# Patient Record
Sex: Male | Born: 1960 | Race: Black or African American | Hispanic: No | Marital: Married | Smoking: Former smoker
Health system: Southern US, Community
[De-identification: ages and names within clinical notes are randomized; demographics above are authoritative.]

## PROBLEM LIST (undated history)

## (undated) DIAGNOSIS — I5020 Unspecified systolic (congestive) heart failure: Secondary | ICD-10-CM

## (undated) DIAGNOSIS — I10 Essential (primary) hypertension: Secondary | ICD-10-CM

## (undated) DIAGNOSIS — Z95810 Presence of automatic (implantable) cardiac defibrillator: Secondary | ICD-10-CM

## (undated) DIAGNOSIS — I447 Left bundle-branch block, unspecified: Secondary | ICD-10-CM

## (undated) DIAGNOSIS — M545 Low back pain, unspecified: Secondary | ICD-10-CM

## (undated) DIAGNOSIS — E7800 Pure hypercholesterolemia, unspecified: Secondary | ICD-10-CM

## (undated) DIAGNOSIS — K219 Gastro-esophageal reflux disease without esophagitis: Secondary | ICD-10-CM

## (undated) DIAGNOSIS — I428 Other cardiomyopathies: Secondary | ICD-10-CM

## (undated) DIAGNOSIS — Z9289 Personal history of other medical treatment: Secondary | ICD-10-CM

## (undated) DIAGNOSIS — N189 Chronic kidney disease, unspecified: Secondary | ICD-10-CM

## (undated) DIAGNOSIS — I509 Heart failure, unspecified: Secondary | ICD-10-CM

## (undated) DIAGNOSIS — I2510 Atherosclerotic heart disease of native coronary artery without angina pectoris: Secondary | ICD-10-CM

## (undated) HISTORY — DX: Unspecified systolic (congestive) heart failure: I50.20

## (undated) HISTORY — DX: Atherosclerotic heart disease of native coronary artery without angina pectoris: I25.10

## (undated) HISTORY — DX: Left bundle-branch block, unspecified: I44.7

## (undated) HISTORY — DX: Low back pain, unspecified: M54.50

## (undated) HISTORY — DX: Essential (primary) hypertension: I10

## (undated) HISTORY — DX: Personal history of other medical treatment: Z92.89

## (undated) HISTORY — DX: Gastro-esophageal reflux disease without esophagitis: K21.9

## (undated) HISTORY — DX: Other cardiomyopathies: I42.8

## (undated) HISTORY — DX: Low back pain: M54.5

## (undated) HISTORY — DX: Chronic kidney disease, unspecified: N18.9

---

## 2009-01-01 ENCOUNTER — Emergency Department (HOSPITAL_COMMUNITY): Admission: EM | Admit: 2009-01-01 | Discharge: 2009-01-01 | Payer: Self-pay | Admitting: Emergency Medicine

## 2009-01-28 ENCOUNTER — Ambulatory Visit: Payer: Self-pay | Admitting: Family Medicine

## 2009-01-28 DIAGNOSIS — K219 Gastro-esophageal reflux disease without esophagitis: Secondary | ICD-10-CM | POA: Insufficient documentation

## 2009-01-28 DIAGNOSIS — I10 Essential (primary) hypertension: Secondary | ICD-10-CM | POA: Insufficient documentation

## 2009-01-28 DIAGNOSIS — M545 Low back pain: Secondary | ICD-10-CM

## 2009-02-20 ENCOUNTER — Encounter (INDEPENDENT_AMBULATORY_CARE_PROVIDER_SITE_OTHER): Payer: Self-pay | Admitting: Family Medicine

## 2009-02-20 LAB — CONVERTED CEMR LAB
AST: 22 units/L (ref 0–37)
Alkaline Phosphatase: 95 units/L (ref 39–117)
BUN: 20 mg/dL (ref 6–23)
Calcium: 9.5 mg/dL (ref 8.4–10.5)
Creatinine, Ser: 1.26 mg/dL (ref 0.40–1.50)
HCT: 45.8 % (ref 39.0–52.0)
Hemoglobin: 14.2 g/dL (ref 13.0–17.0)
Lymphocytes Relative: 27 % (ref 12–46)
Lymphs Abs: 2.6 10*3/uL (ref 0.7–4.0)
Monocytes Absolute: 0.8 10*3/uL (ref 0.1–1.0)
Monocytes Relative: 8 % (ref 3–12)
Neutro Abs: 6 10*3/uL (ref 1.7–7.7)
RDW: 16.2 % — ABNORMAL HIGH (ref 11.5–15.5)
TSH: 2.358 microintl units/mL (ref 0.350–4.50)
Total Bilirubin: 0.4 mg/dL (ref 0.3–1.2)
VLDL: 11 mg/dL (ref 0–40)
WBC: 9.5 10*3/uL (ref 4.0–10.5)

## 2009-02-28 ENCOUNTER — Telehealth (INDEPENDENT_AMBULATORY_CARE_PROVIDER_SITE_OTHER): Payer: Self-pay | Admitting: *Deleted

## 2009-02-28 ENCOUNTER — Ambulatory Visit: Payer: Self-pay | Admitting: Nurse Practitioner

## 2009-03-11 ENCOUNTER — Ambulatory Visit: Payer: Self-pay | Admitting: *Deleted

## 2009-04-09 ENCOUNTER — Ambulatory Visit: Payer: Self-pay | Admitting: Family Medicine

## 2009-04-09 DIAGNOSIS — N402 Nodular prostate without lower urinary tract symptoms: Secondary | ICD-10-CM

## 2009-04-09 DIAGNOSIS — F528 Other sexual dysfunction not due to a substance or known physiological condition: Secondary | ICD-10-CM

## 2009-04-09 LAB — CONVERTED CEMR LAB: PSA: 0.46 ng/mL (ref 0.10–4.00)

## 2009-04-10 ENCOUNTER — Encounter (INDEPENDENT_AMBULATORY_CARE_PROVIDER_SITE_OTHER): Payer: Self-pay | Admitting: Family Medicine

## 2009-04-26 ENCOUNTER — Encounter (INDEPENDENT_AMBULATORY_CARE_PROVIDER_SITE_OTHER): Payer: Self-pay | Admitting: Family Medicine

## 2009-05-02 ENCOUNTER — Ambulatory Visit: Payer: Self-pay | Admitting: Family Medicine

## 2009-07-05 ENCOUNTER — Emergency Department (HOSPITAL_COMMUNITY): Admission: EM | Admit: 2009-07-05 | Discharge: 2009-07-05 | Payer: Self-pay | Admitting: Emergency Medicine

## 2009-08-08 ENCOUNTER — Emergency Department (HOSPITAL_COMMUNITY): Admission: EM | Admit: 2009-08-08 | Discharge: 2009-08-08 | Payer: Self-pay | Admitting: Emergency Medicine

## 2009-12-26 ENCOUNTER — Emergency Department (HOSPITAL_COMMUNITY): Admission: EM | Admit: 2009-12-26 | Discharge: 2009-12-26 | Payer: Self-pay | Admitting: Emergency Medicine

## 2010-01-07 ENCOUNTER — Ambulatory Visit: Payer: Self-pay | Admitting: Physician Assistant

## 2010-01-07 DIAGNOSIS — E785 Hyperlipidemia, unspecified: Secondary | ICD-10-CM

## 2010-01-09 ENCOUNTER — Encounter: Payer: Self-pay | Admitting: Physician Assistant

## 2010-01-10 LAB — CONVERTED CEMR LAB
ALT: 26 units/L (ref 0–53)
AST: 15 units/L (ref 0–37)
Albumin: 4.4 g/dL (ref 3.5–5.2)
Alkaline Phosphatase: 88 units/L (ref 39–117)
BUN: 13 mg/dL (ref 6–23)
Calcium: 9.7 mg/dL (ref 8.4–10.5)
Chloride: 108 meq/L (ref 96–112)
Cholesterol, target level: 200 mg/dL
HDL: 45 mg/dL (ref >39)
Potassium: 4.3 meq/L (ref 3.5–5.3)
Sodium: 141 meq/L (ref 135–145)
Triglycerides: 90 mg/dL (ref <150)

## 2010-02-14 ENCOUNTER — Ambulatory Visit: Payer: Self-pay | Admitting: Physician Assistant

## 2010-02-14 ENCOUNTER — Telehealth: Payer: Self-pay | Admitting: Physician Assistant

## 2010-02-14 DIAGNOSIS — L919 Hypertrophic disorder of the skin, unspecified: Secondary | ICD-10-CM

## 2010-02-14 DIAGNOSIS — I447 Left bundle-branch block, unspecified: Secondary | ICD-10-CM

## 2010-02-14 DIAGNOSIS — L909 Atrophic disorder of skin, unspecified: Secondary | ICD-10-CM | POA: Insufficient documentation

## 2010-02-14 DIAGNOSIS — M79609 Pain in unspecified limb: Secondary | ICD-10-CM

## 2010-02-15 LAB — CONVERTED CEMR LAB
Calcium: 9 mg/dL (ref 8.4–10.5)
Creatinine, Ser: 1.47 mg/dL (ref 0.40–1.50)

## 2010-04-04 ENCOUNTER — Ambulatory Visit: Payer: Self-pay | Admitting: Physician Assistant

## 2010-04-04 ENCOUNTER — Telehealth: Payer: Self-pay | Admitting: Physician Assistant

## 2010-04-04 DIAGNOSIS — R0602 Shortness of breath: Secondary | ICD-10-CM

## 2010-04-04 DIAGNOSIS — K089 Disorder of teeth and supporting structures, unspecified: Secondary | ICD-10-CM | POA: Insufficient documentation

## 2010-04-04 HISTORY — DX: Shortness of breath: R06.02

## 2010-04-08 ENCOUNTER — Emergency Department (HOSPITAL_COMMUNITY): Admission: EM | Admit: 2010-04-08 | Discharge: 2010-04-08 | Payer: Self-pay | Admitting: Emergency Medicine

## 2010-04-15 ENCOUNTER — Encounter (INDEPENDENT_AMBULATORY_CARE_PROVIDER_SITE_OTHER): Payer: Self-pay | Admitting: Internal Medicine

## 2010-04-15 ENCOUNTER — Encounter: Payer: Self-pay | Admitting: Physician Assistant

## 2010-04-15 ENCOUNTER — Ambulatory Visit (HOSPITAL_COMMUNITY): Admission: RE | Admit: 2010-04-15 | Discharge: 2010-04-15 | Payer: Self-pay | Admitting: Internal Medicine

## 2010-04-24 ENCOUNTER — Ambulatory Visit: Payer: Self-pay | Admitting: Cardiology

## 2010-04-25 LAB — CONVERTED CEMR LAB
ALT: 36 units/L (ref 0–53)
AST: 27 units/L (ref 0–37)
Albumin: 4.5 g/dL (ref 3.5–5.2)
Alkaline Phosphatase: 84 units/L (ref 39–117)
BUN: 21 mg/dL (ref 6–23)
Basophils Absolute: 0.1 10*3/uL (ref 0.0–0.1)
Basophils Relative: 0.9 % (ref 0.0–3.0)
CO2: 25 meq/L (ref 19–32)
Calcium: 9.7 mg/dL (ref 8.4–10.5)
Chloride: 108 meq/L (ref 96–112)
Cholesterol: 207 mg/dL — ABNORMAL HIGH (ref 0–200)
Creatinine, Ser: 1.7 mg/dL — ABNORMAL HIGH (ref 0.4–1.5)
Eosinophils Absolute: 0.1 10*3/uL (ref 0.0–0.7)
Lymphocytes Relative: 16.7 % (ref 12.0–46.0)
Lymphs Abs: 2.6 10*3/uL (ref 0.7–4.0)
MCHC: 33.3 g/dL (ref 30.0–36.0)
MCV: 79.1 fL (ref 78.0–100.0)
Monocytes Absolute: 1.2 10*3/uL — ABNORMAL HIGH (ref 0.1–1.0)
Neutrophils Relative %: 74.2 % (ref 43.0–77.0)
Platelets: 321 10*3/uL (ref 150.0–400.0)
RDW: 15.7 % — ABNORMAL HIGH (ref 11.5–14.6)
Total Protein: 8.4 g/dL — ABNORMAL HIGH (ref 6.0–8.3)

## 2010-04-29 ENCOUNTER — Ambulatory Visit: Payer: Self-pay | Admitting: Cardiology

## 2010-04-29 LAB — CONVERTED CEMR LAB
BUN: 15 mg/dL (ref 6–23)
Chloride: 103 meq/L (ref 96–112)
Creatinine, Ser: 1.4 mg/dL (ref 0.4–1.5)
Glucose, Bld: 100 mg/dL — ABNORMAL HIGH (ref 70–99)

## 2010-04-30 ENCOUNTER — Ambulatory Visit: Payer: Self-pay | Admitting: Cardiology

## 2010-04-30 ENCOUNTER — Inpatient Hospital Stay (HOSPITAL_BASED_OUTPATIENT_CLINIC_OR_DEPARTMENT_OTHER): Admission: RE | Admit: 2010-04-30 | Discharge: 2010-04-30 | Payer: Self-pay | Admitting: Cardiology

## 2010-05-01 ENCOUNTER — Telehealth: Payer: Self-pay | Admitting: Cardiology

## 2010-05-21 ENCOUNTER — Encounter (INDEPENDENT_AMBULATORY_CARE_PROVIDER_SITE_OTHER): Payer: Self-pay | Admitting: *Deleted

## 2010-07-09 ENCOUNTER — Telehealth: Payer: Self-pay | Admitting: Cardiology

## 2010-12-30 NOTE — Letter (Signed)
Summary: *Referral Letter  [facility]  [address] [place], [place] [address]   Phone: [phone]  Fax: [phone]    01/07/2010  Thank you in advance for agreeing to see my patient:  [facility] [address] [place], [place]  [address]  Phone: [phone]  Reason for Referral: 50 yo male with symptoms of erectile dysfunction and hypertension with right sided prostate nodule.  PSA was normal in 03/2009.  He has another PSA pending.  He does have a family h/o prostate cancer.  Please evaluate.  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  Sincerely,  Tereso Newcomer PA-C

## 2010-12-30 NOTE — Assessment & Plan Note (Signed)
Summary: NP6/HTN/LBBB ON EKG/DOE   Primary Provider:  Tereso Newcomer PA-C  CC:  new patient. LBBB on EKG.  Pt gets SOB  with exertion.  Pt has reported cramping feeling in chest down to abdomen on left side.  Roger Crosby  History of Present Illness: 50 yo with history of HTN and hyperlipidemia presents for evaluation of exertional dyspnea, new LBBB, and abnormal echocardiogram.  Patient has had 2 months of new exertional dyspnea.  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  This is new for him.  He is ok with mild exertion like walking on flat ground.  He gets sharp pains in the left and right lower chest that are not related to exertion.  No exertional chest pain (only dyspnea).  He was seen by his PCP who noted a new LBBB on ECG and sent him for an echo.  This showed hypokinesis of the apical septal wall, apical anterior wall, and the true apex, worrisome for an LAD distribution.    ECG: sinus tachy at 120 with LBBB  Labs (3/11): K 4.7, creatinine 1.47  Current Medications (verified): 1)  Omeprazole 20 Mg Cpdr (Omeprazole) .... One By Mouth At Bedtime 2)  Lisinopril-Hydrochlorothiazide 20-25 Mg  Tabs (Lisinopril-Hydrochlorothiazide) .... Take 1 Tab By Mouth Each Morning For Blood Pressure 3)  Pravastatin Sodium 20 Mg Tabs (Pravastatin Sodium) .... Take 1 Tab By Mouth At Bedtime For Cholesterol 4)  Aspirin 81 Mg Tabs (Aspirin) .... Once Daily  Allergies (verified): No Known Drug Allergies  Past History:  Past Medical History: 1. Hypertension 2. GERD 3. Low back pain 4. LBBB: Newly noted 2011 5. Echo (5/11): EF 45-50%, mild hypokinesis of the apical anteroseptal wall, apical anterior wall, and true apex (LAD distribution), mild LAE.  6. Mild CKD  Family History: Mother deceased CHF,DM,HTN Father decaease heart trouble...died age 24.  1 sister Crohn's disease 1 sister healthy 1 brother died Prostate  cancer/AIDS...was diagnosed with prostate cancer at age 51.  Father and mother both had MIs in their 36s  Social History: Reviewed history from 01/07/2010 and no changes required. 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 Married 3 kids Alcohol use-no. None since 2008 Drug use-prior cocaine none since 2008. Denies IVDU. Prior smoker none since 2008.  Review of Systems       All systems reviewed and negative except as per HPI.   Vital Signs:  Patient profile:   50 year old male Height:      67 inches Weight:      240 pounds BMI:     37.73 Pulse rate:   120 / minute Pulse rhythm:   regular BP sitting:   126 / 87  (left arm) Cuff size:   large  Vitals Entered By: Judithe Modest CMA (Apr 24, 2010 3:39 PM)  Physical Exam  General:  Well developed, well nourished, in no acute distress. Mildly obese.  Head:  normocephalic and atraumatic Nose:  no deformity, discharge, inflammation, or lesions Mouth:  Teeth, gums and palate normal. Oral mucosa normal. Neck:  Neck supple, no JVD. No masses, thyromegaly or abnormal cervical nodes. Lungs:  Clear bilaterally to auscultation and percussion. Heart:  Non-displaced PMI, chest non-tender; regular rate and rhythm, S1, S2 without murmurs, rubs or gallops. Carotid upstroke normal, no bruit.  Pedals normal pulses. No edema, no varicosities. Abdomen:  Bowel sounds positive; abdomen soft and non-tender without masses, organomegaly, or  hernias noted. No hepatosplenomegaly. Msk:  Back normal, normal gait. Muscle strength and tone normal. Extremities:  No clubbing or cyanosis. Neurologic:  Alert and oriented x 3. Skin:  Intact without lesions or rashes. Psych:  Normal affect.   Impression & Recommendations:  Problem # 1:  DYSPNEA (ICD-786.05) Patient has new shortness of breath with moderate exertion such as walking up steps or working at his landscaping job.  This began about 2 months ago.   He has a new LBBB and has hypokinesis of the apical anterior wall, apical septal wall, and true apex on echo.  This is worrisome for coronary disease in the LAD distribution.  He has hyperlipidemia, HTN, and a family history of CAD.  My suspicion for CAD is high enough that I will go ahead and arrange to do a left heart cath on him for coronary evaluation.  He will continue ASA and statin.   Problem # 2:  LBBB (ICD-426.3) New, ? related to coronary disease.  Cath as above.   Problem # 3:  HYPERLIPIDEMIA (ICD-272.4) Patient recently begun on pravastatin.  Needs lipids/LFTs on this medication.   Problem # 4:  HYPERTENSION (ICD-401.9) BP is controlled.   Other Orders: EKG w/ Interpretation (93000) TLB-BMP (Basic Metabolic Panel-BMET) (80048-METABOL) TLB-CBC Platelet - w/Differential (85025-CBCD) TLB-PT (Protime) (85610-PTP) TLB-PTT (85730-PTTL) TLB-Hepatic/Liver Function Pnl (80076-HEPATIC) TLB-Lipid Panel (80061-LIPID) Cardiac Catheterization (Cardiac Cath)  Patient Instructions: 1)  Your physician recommends that you schedule a follow-up appointment 2 weeks after heart catheterization 2)  Your physician recommends that you continue on your current medications as directed. Please refer to the Current Medication list given to you today. 3)  Your physician has requested that you have a cardiac catheterization.  Cardiac catheterization is used to diagnose and/or treat various heart conditions. Doctors may recommend this procedure for a number of different reasons. The most common reason is to evaluate chest pain. Chest pain can be a symptom of coronary artery disease (CAD), and cardiac catheterization can show whether plaque is narrowing or blocking your heart's arteries. This procedure is also used to evaluate the valves, as well as measure the blood flow and oxygen levels in different parts of your heart.  For further information please visit [email or web address]  Please follow instruction  sheet, as given.

## 2010-12-30 NOTE — Assessment & Plan Note (Signed)
Summary: FU FO RBP AND BMET//KT   Vital Signs:  Patient profile:   50 year old male Weight:      243.4 pounds Temp:     98.1 degrees F oral Pulse rate:   88 / minute Pulse rhythm:   regular Resp:     20 per minute BP sitting:   124 / 86  (left arm) Cuff size:   large  Vitals Entered By: Levon Hedger (February 14, 2010 8:36 AM) CC: follow-up visit...pt has a mole under right arm and is very irritating, Hypertension Management Is Patient Diabetic? No Pain Assessment Patient in pain? no       Does patient need assistance? Functional Status Self care Ambulation Normal   Primary Care Provider:  Tereso Newcomer PA-C  CC:  follow-up visit...pt has a mole under right arm and is very irritating and Hypertension Management.  History of Present Illness: Here for f/u on BP. Doing well.  He is back on meds. No side effects. Waiting on urology appt in April.  Has not rec'd info. yet. Wants meds for ED.  Explained to him last time that he needs to get his prostate nodule addressed first. Has a skin tag under his right arm.  This is irritating him. Notes some numbness in his IT band region on the left.  Walking more now.  Has pain with sitting and standing.  No knee pain.  No back pain.  No true radicular symptoms.  No loss of b/b fxn.   Hypertension History:      He denies chest pain, dyspnea with exertion, and syncope.  He notes no problems with any antihypertensive medication side effects.        Positive major cardiovascular risk factors include male age 20 years old or older, hyperlipidemia, hypertension, and family history for ischemic heart disease (females less than 45 years old & males less than 36 years old).  Negative major cardiovascular risk factors include no history of diabetes and non-tobacco-user status.     Allergies (verified): No Known Drug Allergies  Physical Exam  General:  alert, well-developed, and well-nourished.   Head:  normocephalic and atraumatic.     Neck:  supple.   Lungs:  normal breath sounds.   Heart:  normal rate and regular rhythm.   Abdomen:  soft.   Msk:  neg SLR bilat no lumbar spine tend with palp  Neurologic:  alert & oriented X3 and cranial nerves II-XII intact.   Skin:  small to mod sized skin tag in right axilla Psych:  normally interactive.     Impression & Recommendations:  Problem # 1:  HYPERTENSION (ICD-401.9)  Looks much better check bmet today get baseline ecg  His updated medication list for this problem includes:    Lisinopril-hydrochlorothiazide 20-25 Mg Tabs (Lisinopril-hydrochlorothiazide) .Marland Kitchen... Take 1 tab by mouth each morning for blood pressure  Orders: EKG w/ Interpretation (93000) T-Basic Metabolic Panel (44[address]-27253)  Problem # 2:  SKIN TAG (ICD-701.9)  under left arm will need to come back for removal will need hair around area shaved   Problem # 3:  HYPERLIPIDEMIA (ICD-272.4) needs FLP and LFTs in 3 mos. come fasting to next appt  His updated medication list for this problem includes:    Pravastatin Sodium 20 Mg Tabs (Pravastatin sodium) .Marland Kitchen... 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 One by mouth at bedtime 2)  Lisinopril-hydrochlorothiazide 20-25 Mg Tabs (Lisinopril-hydrochlorothiazide) .... Take 1 tab by mouth each morning for blood  pressure 3)  Pravastatin Sodium 20 Mg Tabs (Pravastatin sodium) .... Take 1 tab by mouth at bedtime for cholesterol  Hypertension Assessment/Plan:      The patient's hypertensive risk group is category B: At least one risk factor (excluding diabetes) with no target organ damage.  His calculated 10 year risk of coronary heart disease is 7 %.  Today's blood pressure is 124/86.  His blood pressure goal is < 140/90.  Patient Instructions: 1)  Arrange 30 minute appointment for skin tag removal with Adrain Butrick. 2)  Please schedule a follow-up appointment in 3 months with Derrian Poli for blood pressure. 3)  Come fasting to 3 month follow up for labs (do not eat or drink anything after midnight the night before except water to take your medicines). 4)  Call us if you do not hear from [facility] in the next 2 weeks. 5)  Do the stretches I showed you 2 times a day. 6)  Ice your left leg in the area that is bothering you 2 times a day. 7)      EKG  Procedure date:  02/14/2010  Findings:      Normal sinus rhythm with rate of:  86 Left bundle branch block.

## 2010-12-30 NOTE — Progress Notes (Signed)
Summary: Card referral  Phone Note Outgoing Call   Summary of Call: Needs referral to cardiology for dyspnea on exertion and left bundle branch block. Initial call taken by: Tereso Newcomer PA-C,  Apr 04, 2010 10:38 AM

## 2010-12-30 NOTE — Assessment & Plan Note (Signed)
Summary: FU FOR SKIN TAG REMOVAL//KT   Vital Signs:  Patient profile:   50 year old male Height:      67 inches Weight:      246 pounds BMI:     38.67 Temp:     98.2 degrees F oral Pulse rate:   87 / minute Pulse rhythm:   regular Resp:     18 per minute BP sitting:   136 / 97  (left arm) Cuff size:   large  Vitals Entered By: Armenia Shannon (Apr 04, 2010 9:31 AM) CC: skin tag removal, Hypertension Management Is Patient Diabetic? No Pain Assessment Patient in pain? no       Does patient need assistance? Functional Status Self care Ambulation Normal   Primary Care Provider:  Tereso Newcomer PA-C  CC:  skin tag removal and Hypertension Management.  History of Present Illness: Here for skin tag removal.  Also reports DOE over the last few weeks.  When I saw him for his CPE he had an ECG with LBBB.  No symptoms at that time.  Now, notes DOE with going up steps.  No syncope.  No chest pain.  No orthopnea, PND or edema.  He was supposed to have an echo.  He has not done this yet.  Hypertension History:      Positive major cardiovascular risk factors include male age 6 years old or older, hyperlipidemia, hypertension, and family history for ischemic heart disease (females less than 2 years old & males less than 71 years old).  Negative major cardiovascular risk factors include no history of diabetes and non-tobacco-user status.     Problems Prior to Update: 1)  Dental Pain  (ICD-525.9) 2)  Dyspnea  (ICD-786.05) 3)  Lbbb  (ICD-426.3) 4)  Leg Pain  (ICD-729.5) 5)  Skin Tag  (ICD-701.9) 6)  Hyperlipidemia  (ICD-272.4) 7)  Erectile Dysfunction  (ICD-302.72) 8)  Nodular Prostate Without Urinary Obstruction  (ICD-600.10) 9)  Low Back Pain  (ICD-724.2) 10)  Gerd  (ICD-530.81) 11)  Hypertension  (ICD-401.9)  Allergies: No Known Drug Allergies  Past History:  Past Medical History: Last updated: 01/28/2009 Hypertension GERD Low back pain  Family History: Reviewed  history from 04/09/2009 and no changes required. Mother deceased CHF,DM,HTN Father decaease heart trouble...died age 46.  1 sister Crohn's disease 1 sister healthy 1 brother died Prostate cancer/AIDS...was diagnosed with prostate cancer at age 64.  Physical Exam  General:  alert, well-developed, and well-nourished.   Neck:  supple.   Lungs:  normal breath sounds.   Heart:  normal rate and regular rhythm.   Extremities:  no edema  Neurologic:  alert & oriented X3.   Skin:  one medium sized skin tag right axilla 2 smaller skin tags post to right axilla     Impression & Recommendations:  Problem # 1:  DYSPNEA (ICD-786.05)  with LBBB, h/o prior cigs and HTN, suggest he have a stress test he should go ahead and get the echo will refer to cardiology  Orders: Cardiology Referral (Cardiology)  Problem # 2:  SKIN TAG (ICD-701.9)  under sterile conditions, all 3 skin tags removed with scissors siler nitrate used for effective hemostasis no apparent complications  Orders: Removal of Skin Tags up to 15 Lesions (11200)  Problem # 3:  DENTAL PAIN (ICD-525.9)  wants to see a dentist refer to dental clinic  Complete Medication List: 1)  Omeprazole 20 Mg Cpdr (Omeprazole) .... One by mouth at bedtime 2)  Lisinopril-hydrochlorothiazide 20-25  Mg Tabs (Lisinopril-hydrochlorothiazide) .... Take 1 tab by mouth each morning for blood pressure 3)  Pravastatin Sodium 20 Mg Tabs (Pravastatin sodium) .... Take 1 tab by mouth at bedtime for cholesterol  Other Orders: Dental Referral (Dentist)  Hypertension Assessment/Plan:      The patient's hypertensive risk group is category B: At least one risk factor (excluding diabetes) with no target organ damage.  His calculated 10 year risk of coronary heart disease is 9 %.  Today's blood pressure is 136/97.  His blood pressure goal is < 140/90.  Patient Instructions: 1)  Call if you have symptoms of infection (fever, chills, redness, yellow  or green discharge).  Call if you develop bleeding that won't stop.  Apply direct pressure to any bleeding for 15 minutes. 2)  Someone will call you to set up a visit with the heart doctor. 3)  Schedule with the dental clinic.

## 2010-12-30 NOTE — Letter (Signed)
Summary: DENTAL REFERRAL  DENTAL REFERRAL   Imported By: Arta Bruce 04/04/2010 11:19:31  _____________________________________________________________________  External Attachment:    Type:   Image     Comment:   External Document

## 2010-12-30 NOTE — Letter (Signed)
Summary: ECHO  ECHO   Imported By: Arta Bruce 04/21/2010 08:58:27  _____________________________________________________________________  External Attachment:    Type:   Image     Comment:   External Document

## 2010-12-30 NOTE — Letter (Signed)
Summary: Appointment - Missed  [facility], Main Office  [address]   [place], [place] [address]   Phone: [phone]  Fax: [phone]     May 21, 2010 MRN: [id number]   [facility] [address] [place], [place]  [address]   Dear Mr. Guadron,  Our records indicate you missed your appointment on 05/14/2010 with Dr. Shirlee Latch. It is very important that we reach you to reschedule this appointment. We look forward to participating in your health care needs. Please contact us at the number listed above at your earliest convenience to reschedule this appointment.     Sincerely,   [facility] Scheduling Team

## 2010-12-30 NOTE — Cardiovascular Report (Signed)
Summary: Pre Cath Orders  Pre Cath Orders   Imported By: Roderic Ovens 05/07/2010 15:11:08  _____________________________________________________________________  External Attachment:    Type:   Image     Comment:   External Document

## 2010-12-30 NOTE — Progress Notes (Signed)
  Phone Note Outgoing Call   Summary of Call: Tell Roger Crosby to start taking ASA 81 mg once daily. Initial call taken by: Brynda Rim,  Apr 04, 2010 12:44 PM  Follow-up for Phone Call        pt is aware Follow-up by: Armenia Shannon,  Apr 07, 2010 12:54 PM

## 2010-12-30 NOTE — Letter (Signed)
Summary: Cardiac Catheterization Instructions- [facility]  [facility], Main Office  [address]   [place], [place] [address]   Phone: [phone]  Fax: [phone]     04/24/2010 MRN: [id number]  [facility] [address] [place], [place]  [address]  Dear Mr. Vanallen,   You are scheduled for a Cardiac Catheterization on April 30, 2010 with Dr.Hagen Bohorquez  Please arrive to the 1st floor of the Heart and Vascular Center at [facility] at 6:30 am  on the day of your procedure. Please do not arrive before 6:30 a.m. Call the Heart and Vascular Center at [phone] if you are unable to make your appointmnet. The Code to get into the parking garage under the building is_0100__. Take the elevators to the 1st floor. You must have someone to drive you home. Someone must be with you for the first 24 hours after you arrive home. Please wear clothes that are easy to get on and off and wear slip-on shoes. Do not eat or drink after midnight except water with your medications that morning. Bring all your medications and current insurance cards with you.  ___ DO NOT take these medications before your procedure: ________________________________________________________________  __X_ Make sure you take your aspirin.  __X_ You may take ALL of your medications with water that morning. ________________________________________________________________________________________________________________________________    ___ Eilene Ghazi instructions:  ________________________________________________________________________________________________________________________________  The usual length of stay after your procedure is 2 to 3 hours. This can vary.  If you have any questions, please call the office at the number listed above.   Dossie Arbour, RN, BSN

## 2010-12-30 NOTE — Assessment & Plan Note (Signed)
Summary: RENEW MEDS AND BE SEEN///KT   Vital Signs:  Patient profile:   50 year old male Height:      67 inches Weight:      242 pounds BMI:     38.04 Temp:     98.0 degrees F oral Pulse rate:   81 / minute Pulse rhythm:   regular Resp:     18 per minute BP sitting:   152 / 110  (left arm) Cuff size:   large  Vitals Entered By: Armenia Shannon (January 07, 2010 11:20 AM) CC: renew meds... pt wants to talk about his sex drive..  pt wants a referral to a colonscopy when [place] returns..., Hypertension Management Is Patient Diabetic? No Pain Assessment Patient in pain? no       Does patient need assistance? Functional Status Self care Ambulation Normal   CC:  renew meds... pt wants to talk about his sex drive..  pt wants a referral to a colonscopy when [place] returns... and Hypertension Management.  History of Present Illness: Here for f/u. 1st meeting with patient.  Saw Dr. Barbaraann Barthel in May and had a right sided prostate nodule noted.  Urology referral was made but patient never went.  PSA was normal.  He had talked to Dr. Barbaraann Barthel at that time about ED.  She held off on prescribing viagra until he had urological evaluation.  Out of BP meds x 2 mos.  Unable to get appt for a long time.  Was taking lisinopril/hct.    Takes omeprazole daily to prevent GERD symptoms.  If he does not take, he gets symptoms.  No melena, hematochezia, or vomiting.  No dysphagia.  Hypertension History:      He denies headache, chest pain, dyspnea with exertion, orthopnea, peripheral edema, and syncope.        Positive major cardiovascular risk factors include male age 28 years old or older, hyperlipidemia, hypertension, and family history for ischemic heart disease (females less than 32 years old & males less than 62 years old).     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 One By Mouth At Bedtime 2)  Lisinopril-Hydrochlorothiazide 20-25 Mg  Tabs (Lisinopril-Hydrochlorothiazide) .... Take 1 Tab By Mouth Each Morning For Blood Pressure  Allergies (verified): No Known Drug Allergies  Past History:  Past Medical History: Last updated: 01/28/2009 Hypertension GERD Low back pain  Past Surgical History: Last updated: 01/28/2009 Denies surgical history  Family History: Reviewed history from 04/09/2009 and no changes required. Mother deceased CHF,DM,HTN Father decaease heart trouble...died age 60.  1 sister Crohn's disease 1 sister healthy 1 brother died Prostate cancer/AIDS...was diagnosed with prostate cancer at age 78.  Social History: Occupation:prior Air traffic controller; currently doing Aeronautical engineer work Designer, television/film set. Married 3 kids Alcohol use-no. None since 2008 Drug use-prior cocaine none since 2008. Denies IVDU. Prior smoker none since 2008.  Physical Exam  General:  alert, well-developed, and well-nourished.   Head:  normocephalic and atraumatic.   Neck:  supple and no carotid bruits.   Lungs:  normal breath sounds, no crackles, and no wheezes.   Heart:  normal rate, regular rhythm, and no murmur.   Abdomen:  soft, non-tender, normal bowel sounds, and no hepatomegaly.  Prostate:  small (bb sized) nodule on right lobe of prostate Extremities:  no edema Neurologic:  alert & oriented X3 and cranial nerves II-XII intact.   Psych:  normally interactive.     Impression & Recommendations:  Problem # 1:  NODULAR PROSTATE WITHOUT URINARY OBSTRUCTION (ICD-600.10) nodule still present send to urology recheck psa  Orders: Urology Referral (Urology) T-PSA [phone])  Problem # 2:  HYPERTENSION (ICD-401.9) restart meds recheck bmet at f/u OV in one month  His  updated medication list for this problem includes:    Lisinopril-hydrochlorothiazide 20-25 Mg Tabs (Lisinopril-hydrochlorothiazide) .Marland Kitchen... Take 1 tab by mouth each morning for blood pressure  Orders: T-Comprehensive Metabolic Panel (25956-38756)  Problem # 3:  GERD (ICD-530.81) restart meds  His updated medication list for this problem includes:    Omeprazole 20 Mg Cpdr (Omeprazole) ..... One by mouth at bedtime  Problem # 4:  ERECTILE DYSFUNCTION (ICD-302.72) await eval of prostate nodule  Problem # 5:  Preventive Health Care (ICD-V70.0) Td shot today patient states he just had the flu check chol today  Complete Medication List: 1)  Omeprazole 20 Mg Cpdr (Omeprazole) .... One by mouth at bedtime 2)  Lisinopril-hydrochlorothiazide 20-25 Mg Tabs (Lisinopril-hydrochlorothiazide) .... Take 1 tab by mouth each morning for blood pressure  Other Orders: T-Lipid Profile (43329-51884) TD Toxoids IM 7 YR + (16606) Admin 1st Vaccine (30160) Admin 1st Vaccine [facility]) [phone])  Hypertension Assessment/Plan:      The patient's hypertensive risk group is category B: At least one risk factor (excluding diabetes) with no target organ damage.  His calculated 10 year risk of coronary heart disease is 14 %.  Today's blood pressure is 152/110.  His blood pressure goal is < 140/90.  Patient Instructions: 1)  Tetanus shot today. 2)  Please schedule a follow-up appointment in 1 month with Don Giarrusso for blood pressure.  Will draw a lab test that day to follow up on your blood pressure medicine (BMET). Prescriptions: LISINOPRIL-HYDROCHLOROTHIAZIDE 20-25 MG  TABS (LISINOPRIL-HYDROCHLOROTHIAZIDE) Take 1 tab by mouth each morning for blood pressure  #30 x 5   Entered and Authorized by:   Tereso Newcomer PA-C   Signed by:   Tereso Newcomer PA-C on 01/07/2010   Method used:   Print then Give to Patient   RxID:   [id number] OMEPRAZOLE 20 MG CPDR (OMEPRAZOLE) one by mouth at bedtime  #30 x 5   Entered and  Authorized by:   Tereso Newcomer PA-C   Signed by:   Tereso Newcomer PA-C on 01/07/2010   Method used:   Print then Give to Patient   RxID:   [id number]      Tetanus/Td Vaccine    Vaccine Type: Td    Site: left deltoid    Mfr: Sanofi Pasteur    Dose: 0.5 ml    Route: IM    Given by: Armenia Shannon    Exp. Date: 03/06/2012    Lot #: [id number]    VIS given: 10/18/07 version given January 07, 2010.   Appended Document: RENEW MEDS AND BE SEEN///KT Patient: Roger Crosby Note: All result statuses are Final unless otherwise noted.  Tests: (1) Comprehensive Metabolic Panel (85462)   Order Note: FASTING   Sodium                    141 mEq/L                   135-145   Potassium  4.3 mEq/L                   3.5-5.3   Chloride                  108 mEq/L                   96-112   CO2                       25 mEq/L                    19-32   Glucose                   97 mg/dL                    16-10   BUN                       13 mg/dL                    9-60   Creatinine                1.04 mg/dL                  0.40-1.50   Bilirubin, Total     [L]  0.2 mg/dL                   4.5-4.0   Alkaline Phosphatase      88 U/L                      39-117   AST/SGOT                  15 U/L                      0-37   ALT/SGPT                  26 U/L                      0-53   Total Protein             7.1 g/dL                    9.8-1.1   Albumin                   4.4 g/dL                    9.1-4.7   Calcium                   9.7 mg/dL                   8.2-95.6  Tests: (2) Lipid Profile (21308)   Cholesterol          [H]  208 mg/dL                   6-578     ATP III Classification:           < 200        mg/dL        Desirable          200 - 239  mg/dL        Borderline High          >= 240        mg/dL        High         Triglyceride              90 mg/dL                    <045   HDL Cholesterol           45 mg/dL                    >40   Total Chol/HDL Ratio       4.6 Ratio  VLDL Cholesterol (Calc)                             18 mg/dL                    9-81  LDL Cholesterol (Calc)                        [H]  145 mg/dL                   1-91           Total Cholesterol/HDL Ratio:CHD Risk                            Coronary Heart Disease Risk Table                                            Men       Women              1/2 Average Risk              3.4        3.3                  Average Risk              5.0        4.4              2 X Average Risk              9.6        7.1              3 X Average Risk             23.4       11.0     Use the calculated Patient Ratio above and the CHD Risk table      to determine the patient's CHD Risk.     ATP III Classification (LDL):           < 100        mg/dL         Optimal          100 - 129     mg/dL         Near or Above Optimal          130 - 159     mg/dL  Borderline High          160 - 189     mg/dL         High           > 190        mg/dL         Very High        Tests: (3) PSA (16109)   PSA                       0.43 ng/mL                  0.10-4.00     Test Methodology: Hybritech PSA  Note: An exclamation mark (!) indicates a result that was not dispersed into the flowsheet. Document Creation Date: 01/07/2010 11:46 PM _______________________________________________________________________  (1) Order result status: Final Collection or observation date-time: 01/07/2010 21:47 Requested date-time: 01/07/2010 12:45 Receipt date-time: 01/07/2010 21:47 Reported date-time: 01/07/2010 23:46 Referring Physician:   Ordering Physician:  Alben Spittle [phone]) Specimen Source:  Source: Lajean Silvius Order Number: [id number] Lab site: [facility]     [address], [address]     [place]  [place]  [address]  (2) Order result status: Final Collection or observation date-time: 01/07/2010 21:47 Requested date-time: 01/07/2010 12:45 Receipt date-time: 01/07/2010  21:47 Reported date-time: 01/07/2010 23:46 Referring Physician:   Ordering Physician:  Alben Spittle [phone]) Specimen Source:  Source: Lajean Silvius Order Number: [id number] Lab site: [facility], [facility]     [address]     [place]  [place]  [address]  (3) Order result status: Final Collection or observation date-time: 01/07/2010 21:47 Requested date-time: 01/07/2010 12:45 Receipt date-time: 01/07/2010 21:47 Reported date-time: 01/07/2010 23:46 Referring Physician:   Ordering Physician:  Alben Spittle [phone]) Specimen Source:  Source: Lajean Silvius Order Number: [id number] Lab site: [facility]     [address]     [place]  [place]  [address]   Signed by Tereso Newcomer PA-C on 01/09/2010 at 9:35 AM  ________________________________________________________________________  Goal LDL is less than 130. Has Fhx of CAD and prior h/o smoking and HTN. Would rec. he start statin therapy for his chol. Start pravastatin 20 mg at bedtime. Recheck Lipids and LFTs in 3 mos. What pharmacy??  Notify him that his PSA is ok but still needs to see urology. Fax copy of my office visit note and these labs to urology with the referral letter.  Clinical Lists Changes  Observations: Added new observation of SMOK STATUS: quit (01/09/2010 9:35) Added new observation of HX OF DM: no (01/09/2010 9:35) Added new observation of BP DIASTOLIC: 110 mmHg (01/09/2010 9:35) Added new observation of BP SYSTOLIC: 152 mmHg (01/09/2010 9:35) Added new observation of CHIEF CMPLNT: Lipid Management  (01/09/2010 9:35) Added new observation of TRIG GOAL: 150 mg/dL (32/95/1884 1:66) Added new observation of HDL GOAL: 40 mg/dL (05/29/1600 0:93) Added new observation of LDL GOAL: 130 mg/dL (23/55/7322 0:25) Added new observation of CHOL GOAL: 200 mg/dL (42/70/6237 6:28) Added new observation of CHD 18YR RSK: 11 %  (01/09/2010 9:35) Added new observation of HX HDL<35: no   (01/09/2010 9:35)        Lipid Management History:      Positive NCEP/ATP III risk factors include male age 88 years old or older, family history for ischemic heart disease (females less than 65 years old & males less than 81 years old), and hypertension.  Negative NCEP/ATP  III risk factors include non-diabetic and non-tobacco-user status.     Lipid Assessment/Plan:      Based on NCEP/ATP III, the patient's risk factor category is "2 or more risk factors and a calculated 10 year CAD risk of < 20%".  The patient's lipid goals are as follows: Total cholesterol goal is 200; LDL cholesterol goal is 130; HDL cholesterol goal is 40; Triglyceride goal is 150.     Social History: Smoking Status:  quit

## 2010-12-30 NOTE — Progress Notes (Signed)
Summary: discuss meds  Phone Note Call from Patient Call back at Home Phone [phone]   Caller: Spouse- kimberly  Reason for Call: Talk to Nurse Summary of Call: Pt is incarcerate until the end of year in [place]. , can Dr.Mclean  keep his meds open until the end of the year. the nurse call the meds in. pt didn't want to run out of his meds.  COREG 6.25 MG TABS one tablet twice a day. LISINOPRIL 10 MG TABS one tablet daily, PRAVASTATIN SODIUM 20 MG TABS Take 1 tab by mouth at bedtime for cholesterol Initial call taken by: Lorne Skeens,  July 09, 2010 9:55 AM    New/Updated Medications: LISINOPRIL 10 MG TABS (LISINOPRIL) one tablet daily PRAVASTATIN SODIUM 20 MG TABS (PRAVASTATIN SODIUM) Take 1 tab by mouth at bedtime for cholesterol Prescriptions: PRAVASTATIN SODIUM 20 MG TABS (PRAVASTATIN SODIUM) Take 1 tab by mouth at bedtime for cholesterol  #90 x 1   Entered by:   Katina Dung, RN, BSN   Authorized by:   [name], MD   Signed by:   Katina Dung, RN, BSN on 07/09/2010   Method used:   Electronically to        [facility] [phone]* (retail)       [address]       [place], [place]  [address]       Ph: [phone]       Fax: [phone]   RxID:   [id number] LISINOPRIL 10 MG TABS (LISINOPRIL) one tablet daily  #90 x 3   Entered by:   Katina Dung, RN, BSN   Authorized by:   [name], MD   Signed by:   Katina Dung, RN, BSN on 07/09/2010   Method used:   Electronically to        [facility] [phone]* (retail)       [address]       [place], [place]  [address]       Ph: [phone]       Fax: [phone]   RxID:   [id number] COREG 6.25 MG TABS (CARVEDILOL) one tablet twice a day  #180 x 3   Entered by:   Katina Dung, RN, BSN   Authorized by:   [name], MD   Signed by:   Katina Dung, RN, BSN on 07/09/2010   Method used:   Electronically to        [facility] [phone]* (retail)       [address]  [place], [place]  [address]       Ph: [phone]       Fax: [phone]   RxID:   [id number]  refills done to [facility]

## 2010-12-30 NOTE — Progress Notes (Signed)
Summary: pls clarify coreg  Phone Note Call from Patient Call back at Home Phone [phone]   Caller: Spouse Reason for Call: Talk to Nurse Summary of Call: pt had cath on 6/1 was given the same type of meds. pls clarify dosage . coreg. Initial call taken by: Lorne Skeens,  May 01, 2010 9:03 AM  Follow-up for Phone Call        reviewed with Dr Shirlee Latch --pt already taking Coreg 6.25mg  twice a day--will not make any additional medication changes-I discussed with wife by telephone-she verbalized understanding

## 2010-12-30 NOTE — Progress Notes (Signed)
Summary: Uro update  Phone Note Outgoing Call   Summary of Call: Roger Crosby, Roger Crosby has not rec'd his package from [facility] urology yet. His appt is next month. Initial call taken by: Tereso Newcomer PA-C,  February 14, 2010 8:52 AM  Follow-up for Phone Call        I will call and give him the number to call for dircetions.. Follow-up by: Candi Leash,  February 18, 2010 11:33 AM

## 2011-05-26 ENCOUNTER — Other Ambulatory Visit: Payer: Self-pay | Admitting: Cardiology

## 2011-05-26 ENCOUNTER — Other Ambulatory Visit: Payer: Self-pay | Admitting: *Deleted

## 2011-05-26 MED ORDER — CARVEDILOL 6.25 MG PO TABS: 6.2500 mg | ORAL_TABLET | Freq: Two times a day (BID) | ORAL | Status: DC

## 2011-08-26 ENCOUNTER — Emergency Department (HOSPITAL_COMMUNITY)
Admission: EM | Admit: 2011-08-26 | Discharge: 2011-08-27 | Disposition: A | Discharge status: Home / Self Care | Payer: Self-pay | Attending: Emergency Medicine | Admitting: Emergency Medicine

## 2011-08-26 DIAGNOSIS — I10 Essential (primary) hypertension: Secondary | ICD-10-CM | POA: Insufficient documentation

## 2011-08-26 DIAGNOSIS — D1739 Benign lipomatous neoplasm of skin and subcutaneous tissue of other sites: Secondary | ICD-10-CM | POA: Insufficient documentation

## 2011-09-03 ENCOUNTER — Other Ambulatory Visit: Payer: Self-pay | Admitting: Cardiology

## 2011-11-13 ENCOUNTER — Other Ambulatory Visit: Payer: Self-pay | Admitting: Cardiology

## 2012-03-03 ENCOUNTER — Other Ambulatory Visit: Payer: Self-pay

## 2012-03-03 MED ORDER — CARVEDILOL 6.25 MG PO TABS: 6.2500 mg | ORAL_TABLET | Freq: Two times a day (BID) | ORAL | Status: DC

## 2012-03-03 MED ORDER — LISINOPRIL 10 MG PO TABS: 10.0000 mg | ORAL_TABLET | Freq: Every day | ORAL | Status: DC

## 2012-03-03 NOTE — Telephone Encounter (Signed)
..   Requested Prescriptions   Signed Prescriptions Disp Refills  . lisinopril (PRINIVIL,ZESTRIL) 10 MG tablet 30 tablet 1    Sig: Take 1 tablet (10 mg total) by mouth daily.    Authorizing Provider: Laurey Morale    Ordering User: Kenyatte Gruber M  Patient needs to make an appointment.

## 2012-03-03 NOTE — Telephone Encounter (Signed)
..   Requested Prescriptions   Signed Prescriptions Disp Refills  . carvedilol (COREG) 6.25 MG tablet 60 tablet 1    Sig: Take 1 tablet (6.25 mg total) by mouth 2 (two) times daily.    Authorizing Provider: Laurey Morale    Ordering User: Christella Hartigan, Damiean Lukes Judie Petit

## 2012-08-10 ENCOUNTER — Other Ambulatory Visit: Payer: Self-pay | Admitting: Cardiology

## 2012-08-11 ENCOUNTER — Telehealth: Payer: Self-pay | Admitting: Cardiology

## 2012-08-11 NOTE — Telephone Encounter (Signed)
Error

## 2012-08-18 ENCOUNTER — Ambulatory Visit: Payer: Self-pay | Admitting: Physician Assistant

## 2012-08-29 ENCOUNTER — Ambulatory Visit (INDEPENDENT_AMBULATORY_CARE_PROVIDER_SITE_OTHER): Payer: Self-pay | Admitting: Physician Assistant

## 2012-08-29 ENCOUNTER — Encounter: Payer: Self-pay | Admitting: Physician Assistant

## 2012-08-29 VITALS — BP 140/80 | HR 86 | Resp 18 | Ht 68.0 in | Wt 219.6 lb

## 2012-08-29 DIAGNOSIS — I2510 Atherosclerotic heart disease of native coronary artery without angina pectoris: Secondary | ICD-10-CM

## 2012-08-29 DIAGNOSIS — E785 Hyperlipidemia, unspecified: Secondary | ICD-10-CM

## 2012-08-29 DIAGNOSIS — I10 Essential (primary) hypertension: Secondary | ICD-10-CM

## 2012-08-29 DIAGNOSIS — I429 Cardiomyopathy, unspecified: Secondary | ICD-10-CM

## 2012-08-29 MED ORDER — OMEPRAZOLE 20 MG PO CPDR: 20.0000 mg | DELAYED_RELEASE_CAPSULE | Freq: Every day | ORAL | Status: DC

## 2012-08-29 MED ORDER — LISINOPRIL 10 MG PO TABS: 10.0000 mg | ORAL_TABLET | Freq: Every day | ORAL | Status: DC

## 2012-08-29 MED ORDER — PRAVASTATIN SODIUM 20 MG PO TABS: ORAL_TABLET | ORAL | Status: DC

## 2012-08-29 MED ORDER — CARVEDILOL 6.25 MG PO TABS: 6.2500 mg | ORAL_TABLET | Freq: Two times a day (BID) | ORAL | Status: DC

## 2012-08-29 NOTE — Progress Notes (Signed)
[address] [place], [place]  [address] Phone: [phone] Fax:  [phone]  Date:  08/29/2012   Name:  Roger Crosby   DOB:  1961-05-03   MRN:  [id number]  PCP:  No primary provider on file. - Previously [facility] Primary Cardiologist:  Dr. [name]  Primary Electrophysiologist:  None    History of Present Illness: Roger Crosby is a 51 y.o. male who is prior patient of mine at [facility] who returns for follow up.  He has a hx of HTN, LBBB, mild NICM with EF 45-50% and mild non-obstructive CAD at cath in 6/11.  Last seen by Dr. [name] in 5/11 prior to his cath.  He returns after being incarcerated.  Now out of his medications x 3 weeks.  Has noted some headaches.  No speech difficulty or unilateral weakness.  No chest pain. No DOE.  No syncope.  No orthopnea, PND, edema.    Labs (5/11):  K 4.3, creatinine 1.4, ALT 36, LDL 155.3, Hgb 14.8   Wt Readings from Last 3 Encounters:  08/29/12 219 lb 9.6 oz (99.61 kg)  04/24/10 240 lb (108.863 kg)  04/04/10 246 lb (111.585 kg)     Past Medical History  Diagnosis Date  . HTN (hypertension)   . GERD (gastroesophageal reflux disease)   . LBP (low back pain)   . LBBB (left bundle branch block)     dx 2011  . Hx of echocardiogram     echo 5/11: EF 45-50%, mild HK of Ap AS wall, ap Ant wall and true apex (LAD distribution), mild LAE  . NICM (nonischemic cardiomyopathy)   . CAD (coronary artery disease)     nonobstructive by [facility] 6/11: oOM1 40%, mild luminal irregs elsewhere  . CKD (chronic kidney disease)     Current Outpatient Prescriptions  Medication Sig Dispense Refill  . carvedilol (COREG) 6.25 MG tablet Take 1 tablet (6.25 mg total) by mouth 2 (two) times daily.  60 tablet  1  . lisinopril (PRINIVIL,ZESTRIL) 10 MG tablet Take 1 tablet (10 mg total) by mouth daily.  30 tablet  1  . omeprazole (PRILOSEC) 20 MG capsule Take 20 mg by mouth daily.      . pravastatin (PRAVACHOL) 20 MG tablet  TAKE ONE TABLET BY MOUTH EVERY DAY AT BEDTIME FOR CHOLESTEROL  90 tablet  1  . DISCONTD: COREG 3.125 MG tablet TAKE ONE TABLET BY MOUTH TWICE DAILY  60 each  6    Allergies: No Known Allergies  History  Substance Use Topics  . Smoking status: Never Smoker   . Smokeless tobacco: Not on file  . Alcohol Use: Not on file     ROS:  Please see the history of present illness.   Has noted some neck pain.  Notes hx of snoring and daytime hypersomnolence.  All other systems reviewed and negative.   PHYSICAL EXAM: VS:  BP 140/80  Pulse 86  Resp 18  Ht 5\' 8"  (1.727 m)  Wt 219 lb 9.6 oz (99.61 kg)  BMI 33.39 kg/m2  SpO2 93% Well nourished, well developed, in no acute distress HEENT: normal Neck: no JVD Cardiac:  normal S1, S2; RRR; no murmur Lungs:  clear to auscultation bilaterally, no wheezing, rhonchi or rales Abd: soft, nontender, no hepatomegaly Ext: no edema Skin: warm and dry Neuro:  CNs 2-12 intact, no focal abnormalities noted  EKG:  NSR, HR 100, LBBB      ASSESSMENT AND PLAN:  1. Non-Ischemic  Cardiomyopathy:  No signs of volume excess.  Restart medications now.  Will refill ACE and beta blocker.  Follow up bmet in one week.  See me back in 2 mos.    2. Non-Obstructive Coronary Disease:  Refill statin and he will start ASA 81 mg QD.  3. Hypertension:  Restart medications.  Follow up with me in 2 mos.  3. Hyperlipidemia:  Restart statin.  Plan follow up FLP and LFTs in 2 mos.  4. Chronic Kidney Disease:  Repeat bmet in 1 week.  5. Snoring:  He will need a sleep study to r/o OSA.  He is set to get insurance with his job soon.  Will likely order test at follow up.  6. Disposition:  We can help him get into see a PCP with [facility] once his insurance kicks in in the next 1-2 mos.  Signed, Tereso Newcomer, PA-C  3:07 PM 08/29/2012

## 2012-08-29 NOTE — Patient Instructions (Signed)
Your physician has recommended you make the following change in your medication:  1) Start aspirin 81 mg once daily.  Your physician recommends that you return for lab work in: 1 week- bmp  Your physician recommends that you schedule a follow-up appointment in: 2 months with Tereso Newcomer, PA  Your physician recommends that you return for FASTING lab work in: 2 months- day of your appointment with Lorin Picket- lipid/liver

## 2012-09-05 ENCOUNTER — Telehealth: Payer: Self-pay | Admitting: *Deleted

## 2012-09-05 ENCOUNTER — Encounter: Payer: Self-pay | Admitting: Physician Assistant

## 2012-09-05 ENCOUNTER — Other Ambulatory Visit (INDEPENDENT_AMBULATORY_CARE_PROVIDER_SITE_OTHER): Payer: Self-pay

## 2012-09-05 DIAGNOSIS — I2510 Atherosclerotic heart disease of native coronary artery without angina pectoris: Secondary | ICD-10-CM

## 2012-09-05 DIAGNOSIS — I10 Essential (primary) hypertension: Secondary | ICD-10-CM

## 2012-09-05 DIAGNOSIS — E785 Hyperlipidemia, unspecified: Secondary | ICD-10-CM

## 2012-09-05 DIAGNOSIS — I429 Cardiomyopathy, unspecified: Secondary | ICD-10-CM

## 2012-09-05 LAB — BASIC METABOLIC PANEL
BUN: 17 mg/dL (ref 6–23)
Chloride: 109 mEq/L (ref 96–112)
Creatinine, Ser: 1.4 mg/dL (ref 0.4–1.5)
GFR: 66.92 mL/min (ref >60.00)
Potassium: 4.1 mEq/L (ref 3.5–5.1)

## 2012-09-05 LAB — HEPATIC FUNCTION PANEL
AST: 14 U/L (ref 0–37)
Bilirubin, Direct: 0 mg/dL (ref 0.0–0.3)
Total Bilirubin: 0.3 mg/dL (ref 0.3–1.2)

## 2012-09-05 LAB — LIPID PANEL
HDL: 40.1 mg/dL (ref >39.00)
LDL Cholesterol: 123 mg/dL — ABNORMAL HIGH (ref 0–99)
VLDL: 15.4 mg/dL (ref 0.0–40.0)

## 2012-09-05 NOTE — Telephone Encounter (Signed)
Message copied by Tarri Fuller on Mon Sep 05, 2012  5:09 PM ------      Message from: [place], [place] T      Created: Mon Sep 05, 2012  1:38 PM       Renal fxn and K+ ok.      He was supposed to have Lipids and LFTs drawn in 2 mos (just restarted statin).      Tereso Newcomer, PA-C  1:38 PM 09/05/2012

## 2012-09-05 NOTE — Telephone Encounter (Signed)
lvm w/pt's sister labs good and that L/L were done in error so we will credit for the L/L and pt will have L/L done on 11/22 same day he sees PA as already scheduled, sister verbalized understanding today

## 2012-09-05 NOTE — Telephone Encounter (Signed)
Message copied by Tarri Fuller on Mon Sep 05, 2012  5:08 PM ------      Message from: [place], [place] T      Created: Mon Sep 05, 2012  1:38 PM       Renal fxn and K+ ok.      He was supposed to have Lipids and LFTs drawn in 2 mos (just restarted statin).      Tereso Newcomer, PA-C  1:38 PM 09/05/2012

## 2012-10-17 ENCOUNTER — Other Ambulatory Visit: Payer: Self-pay

## 2012-10-17 ENCOUNTER — Ambulatory Visit: Payer: Self-pay | Admitting: Physician Assistant

## 2012-10-20 ENCOUNTER — Other Ambulatory Visit: Payer: Self-pay | Admitting: *Deleted

## 2012-10-20 DIAGNOSIS — I10 Essential (primary) hypertension: Secondary | ICD-10-CM

## 2012-10-20 DIAGNOSIS — E785 Hyperlipidemia, unspecified: Secondary | ICD-10-CM

## 2012-10-21 ENCOUNTER — Other Ambulatory Visit: Payer: Self-pay

## 2012-10-21 ENCOUNTER — Ambulatory Visit: Payer: Self-pay | Admitting: Physician Assistant

## 2012-10-31 ENCOUNTER — Other Ambulatory Visit (INDEPENDENT_AMBULATORY_CARE_PROVIDER_SITE_OTHER): Payer: Self-pay

## 2012-10-31 DIAGNOSIS — E785 Hyperlipidemia, unspecified: Secondary | ICD-10-CM

## 2012-10-31 DIAGNOSIS — I10 Essential (primary) hypertension: Secondary | ICD-10-CM

## 2012-10-31 LAB — HEPATIC FUNCTION PANEL
ALT: 21 U/L (ref 0–53)
AST: 17 U/L (ref 0–37)
Albumin: 3.5 g/dL (ref 3.5–5.2)
Alkaline Phosphatase: 70 U/L (ref 39–117)

## 2012-10-31 LAB — LIPID PANEL: Total CHOL/HDL Ratio: 4

## 2012-11-02 ENCOUNTER — Telehealth: Payer: Self-pay | Admitting: *Deleted

## 2012-11-02 DIAGNOSIS — E785 Hyperlipidemia, unspecified: Secondary | ICD-10-CM

## 2012-11-02 MED ORDER — PRAVASTATIN SODIUM 40 MG PO TABS: ORAL_TABLET | ORAL | Status: DC

## 2012-11-02 NOTE — Telephone Encounter (Signed)
pt notified about lab results and to increase pravastatin to 40 mg daily, new rx sent in today to [facility] [address] per pt request. FLP/LFT 01-30-13, pt verbalized understanding today to instructions

## 2012-11-02 NOTE — Telephone Encounter (Signed)
Message copied by Tarri Fuller on Wed Nov 02, 2012  9:12 AM ------      Message from: [place], [place] T      Created: Tue Nov 01, 2012  8:19 AM       Would like to see LDL < 100.      Increase Pravastatin to 40 mg QHS.      Check Lipids and LFTs in 3 mos.      Tereso Newcomer, PA-C  8:19 AM 11/01/2012

## 2012-11-14 ENCOUNTER — Emergency Department (HOSPITAL_COMMUNITY)
Admission: EM | Admit: 2012-11-14 | Discharge: 2012-11-14 | Disposition: A | Discharge status: Home / Self Care | Payer: Self-pay | Attending: Emergency Medicine | Admitting: Emergency Medicine

## 2012-11-14 ENCOUNTER — Encounter (HOSPITAL_COMMUNITY): Payer: Self-pay | Admitting: Adult Health

## 2012-11-14 DIAGNOSIS — M62830 Muscle spasm of back: Secondary | ICD-10-CM

## 2012-11-14 DIAGNOSIS — I129 Hypertensive chronic kidney disease with stage 1 through stage 4 chronic kidney disease, or unspecified chronic kidney disease: Secondary | ICD-10-CM | POA: Insufficient documentation

## 2012-11-14 DIAGNOSIS — M5380 Other specified dorsopathies, site unspecified: Secondary | ICD-10-CM | POA: Insufficient documentation

## 2012-11-14 DIAGNOSIS — R109 Unspecified abdominal pain: Secondary | ICD-10-CM | POA: Insufficient documentation

## 2012-11-14 DIAGNOSIS — Z7982 Long term (current) use of aspirin: Secondary | ICD-10-CM | POA: Insufficient documentation

## 2012-11-14 DIAGNOSIS — Z79899 Other long term (current) drug therapy: Secondary | ICD-10-CM | POA: Insufficient documentation

## 2012-11-14 DIAGNOSIS — K219 Gastro-esophageal reflux disease without esophagitis: Secondary | ICD-10-CM | POA: Insufficient documentation

## 2012-11-14 DIAGNOSIS — Z8679 Personal history of other diseases of the circulatory system: Secondary | ICD-10-CM | POA: Insufficient documentation

## 2012-11-14 DIAGNOSIS — N189 Chronic kidney disease, unspecified: Secondary | ICD-10-CM | POA: Insufficient documentation

## 2012-11-14 LAB — URINALYSIS, ROUTINE W REFLEX MICROSCOPIC
Bilirubin Urine: NEGATIVE
Ketones, ur: NEGATIVE mg/dL
Leukocytes, UA: NEGATIVE
Nitrite: NEGATIVE
Protein, ur: NEGATIVE mg/dL
Urobilinogen, UA: 0.2 mg/dL (ref 0.0–1.0)
pH: 5.5 (ref 5.0–8.0)

## 2012-11-14 MED ORDER — TRAMADOL-ACETAMINOPHEN 37.5-325 MG PO TABS: ORAL_TABLET | ORAL | Status: DC

## 2012-11-14 MED ORDER — CYCLOBENZAPRINE HCL 10 MG PO TABS: 10.0000 mg | ORAL_TABLET | Freq: Three times a day (TID) | ORAL | Status: DC | PRN

## 2012-11-14 MED ORDER — DIAZEPAM 5 MG/ML IJ SOLN
10.0000 mg | Freq: Once | INTRAMUSCULAR | Status: AC
  Administered 2012-11-14: 10 mg via INTRAMUSCULAR
  Filled 2012-11-14: qty 2

## 2012-11-14 MED ORDER — KETOROLAC TROMETHAMINE 60 MG/2ML IM SOLN
60.0000 mg | Freq: Once | INTRAMUSCULAR | Status: AC
  Administered 2012-11-14: 60 mg via INTRAMUSCULAR
  Filled 2012-11-14: qty 2

## 2012-11-14 NOTE — ED Provider Notes (Signed)
History   This chart was scribed for Ward Givens, MD by Gerlean Ren, ED Scribe. This patient was seen in room TR11C/TR11C and the patient's care was started at 9:47 PM     CSN: [id number]  Arrival date & time 11/14/12  1949   None     Chief Complaint  Patient presents with  . Back Pain     The history is provided by the patient. No language interpreter was used.   Roger Crosby is a 51 y.o. male with h/o HTN, LBBB, NICM, CAD, and CKD who presents to the Emergency Department complaining of constant, sharp, moderate, non-radiating right side back pain with sudden onset when he twisted while walking earlier today, states he turned to pick up a pallet jack, and  that is worsened by movement, bending over and coughing and has no improving factors.  Pt denies h/o similar pain, nausea, emesis, difficulty urinating, hematuria.  Pt reports occasional tobacco and alcohol use.  Cardiologist is Dr. Diona Browner at [facility].  Past Medical History  Diagnosis Date  . HTN (hypertension)   . GERD (gastroesophageal reflux disease)   . LBP (low back pain)   . LBBB (left bundle branch block)     dx 2011  . Hx of echocardiogram     echo 5/11: EF 45-50%, mild HK of Ap AS wall, ap Ant wall and true apex (LAD distribution), mild LAE  . NICM (nonischemic cardiomyopathy)   . CAD (coronary artery disease)     nonobstructive by [facility] 6/11: oOM1 40%, mild luminal irregs elsewhere  . CKD (chronic kidney disease)     History reviewed. No pertinent past surgical history.  History reviewed. No pertinent family history.  History  Substance Use Topics  . Smoking status: occassional   . Smokeless tobacco: Not on file  . Alcohol Use: occasional   employed   Review of Systems  Allergies  Review of patient's allergies indicates no known allergies.  Home Medications   Current Outpatient Rx  Name  Route  Sig  Dispense  Refill  . ASPIRIN EC 81 MG PO TBEC   Oral   Take 1 tablet (81 mg total) by mouth  daily.         Marland Kitchen CARVEDILOL 6.25 MG PO TABS   Oral   Take 1 tablet (6.25 mg total) by mouth 2 (two) times daily.   60 tablet   6   . LISINOPRIL 10 MG PO TABS   Oral   Take 1 tablet (10 mg total) by mouth daily.   30 tablet   6   . OMEPRAZOLE 20 MG PO CPDR   Oral   Take 1 capsule (20 mg total) by mouth daily.   30 capsule   6   . PRAVASTATIN SODIUM 40 MG PO TABS      Take one tablet by mouth daily   30 tablet   11     BP 151/81  Pulse 83  Temp 98.1 F (36.7 C) (Oral)  Resp 16  SpO2 96%  Vital signs normal except hypertension   Physical Exam  Constitutional: He is oriented to person, place, and time. He appears well-developed and well-nourished.  Non-toxic appearance. He does not appear ill. No distress.  HENT:  Head: Normocephalic and atraumatic.  Right Ear: External ear normal.  Left Ear: External ear normal.  Nose: Nose normal. No mucosal edema or rhinorrhea.  Mouth/Throat: Oropharynx is clear and moist and mucous membranes are normal. No dental abscesses or  uvula swelling.  Eyes: Conjunctivae normal and EOM are normal. Pupils are equal, round, and reactive to light.  Neck: Normal range of motion and full passive range of motion without pain. Neck supple.  Cardiovascular: Normal rate, regular rhythm and normal heart sounds.  Exam reveals no gallop and no friction rub.   No murmur heard. Pulmonary/Chest: Effort normal and breath sounds normal. No respiratory distress. He has no wheezes. He has no rhonchi. He has no rales. He exhibits no tenderness and no crepitus.  Abdominal: Soft. Normal appearance and bowel sounds are normal. He exhibits no distension. There is no tenderness. There is no rebound and no guarding.  Musculoskeletal: Normal range of motion. He exhibits no edema and no tenderness.       Arms:      Moves all extremities well. Lumbar, thoracic, and cervical spine non-tender.  Tender in right side para-spinus muscles in mid-thoracic down through  lumbar regions.  On ROM of waist most pain when leaning to the right.  Straight leg raise negative bilaterally. Patellar reflexes +2 and =  Neurological: He is alert and oriented to person, place, and time. He has normal strength. No cranial nerve deficit.       Patellar reflexes 2+ and equal   Skin: Skin is warm, dry and intact. No rash noted. No erythema. No pallor.  Psychiatric: He has a normal mood and affect. His speech is normal and behavior is normal. His mood appears not anxious.    ED Course  Procedures (including critical care time)   Medications  ketorolac (TORADOL) injection 60 mg (60 mg Intramuscular Given 11/14/12 2204)  diazepam (VALIUM) injection 10 mg (10 mg Intramuscular Given 11/14/12 2202)    DIAGNOSTIC STUDIES: Oxygen Saturation is 96% on room air, adequate by my interpretation.    COORDINATION OF CARE: 9:53 PM- Patient informed of clinical course, understands medical decision-making process, and agrees with plan.  Ordered IM Toradol, IM Valium, and urinalysis.    Results for orders placed during the hospital encounter of 11/14/12  URINALYSIS, ROUTINE W REFLEX MICROSCOPIC      Component Value Range   Color, Urine YELLOW  YELLOW   APPearance CLEAR  CLEAR   Specific Gravity, Urine 1.028  1.005 - 1.030   pH 5.5  5.0 - 8.0   Glucose, UA NEGATIVE  NEGATIVE mg/dL   Hgb urine dipstick NEGATIVE  NEGATIVE   Bilirubin Urine NEGATIVE  NEGATIVE   Ketones, ur NEGATIVE  NEGATIVE mg/dL   Protein, ur NEGATIVE  NEGATIVE mg/dL   Urobilinogen, UA 0.2  0.0 - 1.0 mg/dL   Nitrite NEGATIVE  NEGATIVE   Leukocytes, UA NEGATIVE  NEGATIVE   Laboratory interpretation all normal      1. Muscle spasm of back     New Prescriptions   CYCLOBENZAPRINE (FLEXERIL) 10 MG TABLET    Take 1 tablet (10 mg total) by mouth 3 (three) times daily as needed for muscle spasms.   TRAMADOL-ACETAMINOPHEN (ULTRACET) 37.5-325 MG PER TABLET    2 tabs po QID prn pain    Plan discharge  Devoria Albe, MD, FACEP   MDM   I personally performed the services described in this documentation, which was scribed in my presence. The recorded information has been reviewed and considered.  Devoria Albe, MD, [facility] Gang         Ward Givens, MD 11/14/12 [phone]

## 2012-11-14 NOTE — ED Notes (Addendum)
While walking tonight pt turned and had a sudden onset of right sided flank/back pain. Pain is worse with movement and deep inspiration. Pain is described as "punching pain" rated 10/10.  Denies urinary symptoms.  Pain is with movement and better with rest.

## 2013-01-30 ENCOUNTER — Other Ambulatory Visit (INDEPENDENT_AMBULATORY_CARE_PROVIDER_SITE_OTHER): Payer: Self-pay

## 2013-01-30 DIAGNOSIS — E785 Hyperlipidemia, unspecified: Secondary | ICD-10-CM

## 2013-01-30 LAB — LIPID PANEL
Cholesterol: 186 mg/dL (ref 0–200)
HDL: 38.6 mg/dL — ABNORMAL LOW (ref >39.00)
LDL Cholesterol: 135 mg/dL — ABNORMAL HIGH (ref 0–99)
Triglycerides: 62 mg/dL (ref 0.0–149.0)
VLDL: 12.4 mg/dL (ref 0.0–40.0)

## 2013-01-30 LAB — HEPATIC FUNCTION PANEL
Bilirubin, Direct: 0.1 mg/dL (ref 0.0–0.3)
Total Bilirubin: 0.8 mg/dL (ref 0.3–1.2)

## 2013-02-01 ENCOUNTER — Telehealth: Payer: Self-pay | Admitting: *Deleted

## 2013-02-01 NOTE — Telephone Encounter (Signed)
pt notified about lab results and states he had stopped pravastatin x 3 weeks said was causing headache. pt also asked to be seen due to sob x 3 weeks when climbing stairs. made appt to see PA 11:10 02/02/13

## 2013-02-01 NOTE — Telephone Encounter (Signed)
Message copied by Tarri Fuller on Wed Feb 01, 2013  6:39 PM ------      Message from: [place], [place] T      Created: Tue Jan 31, 2013  3:43 PM       Is he still taking Pravastatin?      Tereso Newcomer, PA-C  3:43 PM 01/31/2013 ------

## 2013-02-02 ENCOUNTER — Ambulatory Visit: Payer: Self-pay | Admitting: Physician Assistant

## 2013-02-03 ENCOUNTER — Telehealth: Payer: Self-pay | Admitting: *Deleted

## 2013-02-03 DIAGNOSIS — E785 Hyperlipidemia, unspecified: Secondary | ICD-10-CM

## 2013-02-03 MED ORDER — ROSUVASTATIN CALCIUM 10 MG PO TABS: 10.0000 mg | ORAL_TABLET | Freq: Every day | ORAL | Status: DC

## 2013-02-03 NOTE — Telephone Encounter (Signed)
lmom per our conversation the other day pt states pravastatin was causing headaches, per SW, PA start crestor 10; rx sent in to [facility] [address]

## 2013-02-03 NOTE — Telephone Encounter (Signed)
Message copied by Tarri Fuller on Fri Feb 03, 2013 10:26 AM ------      Message from: [place], [place] T      Created: Thu Feb 02, 2013 11:02 PM       Try Crestor 10 mg QD.      Check Lipids and LFTs in 3 mos.      Tereso Newcomer, PA-C  11:01 PM 02/02/2013 ------

## 2013-03-17 ENCOUNTER — Encounter: Payer: Self-pay | Admitting: Physician Assistant

## 2013-04-14 ENCOUNTER — Ambulatory Visit: Payer: Self-pay | Admitting: Physician Assistant

## 2013-05-05 ENCOUNTER — Ambulatory Visit: Payer: Self-pay | Admitting: Physician Assistant

## 2013-05-18 ENCOUNTER — Other Ambulatory Visit: Payer: Self-pay | Admitting: Physician Assistant

## 2013-05-19 ENCOUNTER — Other Ambulatory Visit: Payer: Self-pay | Admitting: *Deleted

## 2013-05-22 ENCOUNTER — Ambulatory Visit
Admission: RE | Admit: 2013-05-22 | Discharge: 2013-05-22 | Disposition: A | Discharge status: Home / Self Care | Payer: No Typology Code available for payment source | Source: Ambulatory Visit | Attending: Nurse Practitioner | Admitting: Nurse Practitioner

## 2013-05-22 ENCOUNTER — Ambulatory Visit (INDEPENDENT_AMBULATORY_CARE_PROVIDER_SITE_OTHER): Payer: No Typology Code available for payment source | Admitting: Nurse Practitioner

## 2013-05-22 ENCOUNTER — Encounter: Payer: Self-pay | Admitting: Nurse Practitioner

## 2013-05-22 VITALS — BP 134/82 | HR 66 | Ht 68.0 in | Wt 216.0 lb

## 2013-05-22 DIAGNOSIS — R0600 Dyspnea, unspecified: Secondary | ICD-10-CM

## 2013-05-22 DIAGNOSIS — I429 Cardiomyopathy, unspecified: Secondary | ICD-10-CM

## 2013-05-22 DIAGNOSIS — I10 Essential (primary) hypertension: Secondary | ICD-10-CM

## 2013-05-22 DIAGNOSIS — R0609 Other forms of dyspnea: Secondary | ICD-10-CM

## 2013-05-22 DIAGNOSIS — I2510 Atherosclerotic heart disease of native coronary artery without angina pectoris: Secondary | ICD-10-CM

## 2013-05-22 LAB — BASIC METABOLIC PANEL
BUN: 13 mg/dL (ref 6–23)
CO2: 28 mEq/L (ref 19–32)
Calcium: 9.4 mg/dL (ref 8.4–10.5)
Chloride: 105 mEq/L (ref 96–112)
Creatinine, Ser: 1.7 mg/dL — ABNORMAL HIGH (ref 0.4–1.5)
GFR: 54.66 mL/min — ABNORMAL LOW (ref >60.00)
Glucose, Bld: 85 mg/dL (ref 70–99)
Potassium: 4.2 mEq/L (ref 3.5–5.1)
Sodium: 139 mEq/L (ref 135–145)

## 2013-05-22 LAB — CBC WITH DIFFERENTIAL/PLATELET
Basophils Absolute: 0 10*3/uL (ref 0.0–0.1)
Basophils Relative: 0.4 % (ref 0.0–3.0)
Eosinophils Absolute: 0.1 10*3/uL (ref 0.0–0.7)
Eosinophils Relative: 1 % (ref 0.0–5.0)
HCT: 49 % (ref 39.0–52.0)
Hemoglobin: 15.8 g/dL (ref 13.0–17.0)
Lymphocytes Relative: 26.4 % (ref 12.0–46.0)
Lymphs Abs: 2.6 10*3/uL (ref 0.7–4.0)
MCHC: 32.2 g/dL (ref 30.0–36.0)
MCV: 81.6 fl (ref 78.0–100.0)
Monocytes Absolute: 0.8 10*3/uL (ref 0.1–1.0)
Monocytes Relative: 7.8 % (ref 3.0–12.0)
Neutro Abs: 6.3 10*3/uL (ref 1.4–7.7)
Neutrophils Relative %: 64.4 % (ref 43.0–77.0)
Platelets: 302 10*3/uL (ref 150.0–400.0)
RBC: 6 Mil/uL — ABNORMAL HIGH (ref 4.22–5.81)
RDW: 15.2 % — ABNORMAL HIGH (ref 11.5–14.6)
WBC: 9.9 10*3/uL (ref 4.5–10.5)

## 2013-05-22 LAB — BRAIN NATRIURETIC PEPTIDE: Pro B Natriuretic peptide (BNP): 86 pg/mL (ref 0.0–100.0)

## 2013-05-22 MED ORDER — CARVEDILOL 6.25 MG PO TABS: 6.2500 mg | ORAL_TABLET | Freq: Two times a day (BID) | ORAL | Status: DC

## 2013-05-22 MED ORDER — LISINOPRIL 20 MG PO TABS: 20.0000 mg | ORAL_TABLET | Freq: Every day | ORAL | Status: DC

## 2013-05-22 NOTE — Progress Notes (Signed)
Rodney Langton Date of Birth: Apr 29, 1961 Medical Record #[id number]  History of Present Illness: Mr. Breeze is seen back today for a work in visit. Seen for Dr. Shirlee Latch. Has a history of HTN, LBBB, mild NICM with EF 45 to 50% and mild non obstructive CAD per cath in 04/2010. Has not been seen in almost one year.   Comes back today. Here for a couple of reasons. Needs his medicines refilled. Also with a 3 to 4 month history of dyspnea when he goes to lie down. Has to sit up at times to breath. No swelling. No cough. No bloating. BP typically a little high. Tries to not use salt. Still smoking some. Has lost his job - trying to sign up for [employer]. Tries to be active and has no symptoms with activity i.e., mowing the grass. No chest pain. Not lightheaded or dizzy. Says he has not missed his medicines.    Current Outpatient Prescriptions  Medication Sig Dispense Refill  . aspirin EC 81 MG tablet Take 1 tablet (81 mg total) by mouth daily.      . carvedilol (COREG) 6.25 MG tablet Take 1 tablet (6.25 mg total) by mouth 2 (two) times daily.  60 tablet  6  . cyclobenzaprine (FLEXERIL) 10 MG tablet Take 1 tablet (10 mg total) by mouth 3 (three) times daily as needed for muscle spasms.  30 tablet  0  . lisinopril (PRINIVIL,ZESTRIL) 10 MG tablet TAKE ONE TABLET BY MOUTH EVERY DAY  30 tablet  1  . omeprazole (PRILOSEC) 20 MG capsule Take 1 capsule (20 mg total) by mouth daily.  30 capsule  6  . traMADol-acetaminophen (ULTRACET) 37.5-325 MG per tablet 2 tabs po QID prn pain  16 tablet  0   No current facility-administered medications for this visit.    No Known Allergies  Past Medical History  Diagnosis Date  . HTN (hypertension)   . GERD (gastroesophageal reflux disease)   . LBP (low back pain)   . LBBB (left bundle branch block)     dx 2011  . Hx of echocardiogram     echo 5/11: EF 45-50%, mild HK of Ap AS wall, ap Ant wall and true apex (LAD distribution), mild LAE  . NICM (nonischemic  cardiomyopathy)   . CAD (coronary artery disease)     nonobstructive by [facility] 6/11: oOM1 40%, mild luminal irregs elsewhere  . CKD (chronic kidney disease)     History reviewed. No pertinent past surgical history.  History  Smoking status  . Never Smoker   Smokeless tobacco  . Not on file    History  Alcohol Use: Not on file    History reviewed. No pertinent family history.  Review of Systems: The review of systems is per the HPI.  All other systems were reviewed and are negative.  Physical Exam: BP 134/82  Pulse 66  Ht 5\' 8"  (1.727 m)  Wt 216 lb (97.977 kg)  BMI 32.85 kg/m2 Patient is very pleasant and in no acute distress. Weight is actually down 3 pounds. Skin is warm and dry. Color is normal.  HEENT is unremarkable. Normocephalic/atraumatic. PERRL. Sclera are nonicteric. Neck is supple. No masses. No JVD. Lungs are clear. Cardiac exam shows a regular rate and rhythm. Abdomen is soft. Extremities are without edema. Gait and ROM are intact. No gross neurologic deficits noted.  LABORATORY DATA: PENDING  Lab Results  Component Value Date   WBC 15.8* 04/24/2010   HGB 14.8 04/24/2010   HCT  44.5 04/24/2010   PLT 321.0 04/24/2010   GLUCOSE 99 09/05/2012   CHOL 186 01/30/2013   TRIG 62.0 01/30/2013   HDL 38.60* 01/30/2013   LDLDIRECT 155.3 04/24/2010   LDLCALC 135* 01/30/2013   ALT 29 01/30/2013   AST 18 01/30/2013   NA 141 09/05/2012   K 4.1 09/05/2012   CL 109 09/05/2012   CREATININE 1.4 09/05/2012   BUN 17 09/05/2012   CO2 26 09/05/2012   TSH 2.358 01/28/2009   PSA 0.43 01/07/2010   INR 1.2 ratio* 04/24/2010   Echo Study Conclusions from 03/2010  - Left ventricle: Systolic function was mildly reduced. The estimated ejection fraction was in the range of 45% to 50%. Mild hypokinesis of the distal anteroseptal, anterior, and apical myocardium; in the distribution of the left anterior descending coronary artery. - Left atrium: The atrium was mildly dilated. - Pulmonary arteries: PA peak  pressure: 33mm Hg (S).   Assessment / Plan: 1. NICM - last EF was 45% - now with shortness of breath/PND - will get his echo updated. Check labs today to include BNP. Send for CXR as well given an over 30 year history of smoking.   2. HTN - BP by me is 130/100. I am increasing his Lisinopril to 20 mg per day, especially in light of the mild reduction in his EF.   3. Nonobstructive CAD - no chest pain reported.   4. Tobacco abuse - encouraged to stop.   He has lost his job - this may limit how much we can do for him unfortunately.   Patient is agreeable to this plan and will call if any problems develop in the interim.   Rosalio Macadamia, RN, ANP-C [facility] [address] [place], [place]  [address]

## 2013-05-22 NOTE — Patient Instructions (Addendum)
We need to check labs today  Go to [facility] today for a CXR - [employer] - 1st floor - you can walk in  We need to get an ultrasound of your heart scheduled  I have increased your Lisinopril to 20 mg a day - this is at the drug store  I refilled the Coreg today as well  I will see you in about 3 weeks.  Try to not smoke  Call the [facility] office at [phone] if you have any questions, problems or concerns.

## 2013-05-22 NOTE — Addendum Note (Signed)
Addended by: Rosalio Macadamia on: 05/22/2013 02:38 PM   Modules accepted: Orders

## 2013-05-25 ENCOUNTER — Ambulatory Visit (HOSPITAL_COMMUNITY): Payer: Self-pay | Attending: Cardiology | Admitting: Radiology

## 2013-05-25 DIAGNOSIS — I429 Cardiomyopathy, unspecified: Secondary | ICD-10-CM

## 2013-05-25 DIAGNOSIS — I2510 Atherosclerotic heart disease of native coronary artery without angina pectoris: Secondary | ICD-10-CM

## 2013-05-25 DIAGNOSIS — I10 Essential (primary) hypertension: Secondary | ICD-10-CM

## 2013-05-25 DIAGNOSIS — R0989 Other specified symptoms and signs involving the circulatory and respiratory systems: Secondary | ICD-10-CM | POA: Insufficient documentation

## 2013-05-25 DIAGNOSIS — I447 Left bundle-branch block, unspecified: Secondary | ICD-10-CM | POA: Insufficient documentation

## 2013-05-25 DIAGNOSIS — R0600 Dyspnea, unspecified: Secondary | ICD-10-CM

## 2013-05-25 DIAGNOSIS — R0609 Other forms of dyspnea: Secondary | ICD-10-CM | POA: Insufficient documentation

## 2013-05-25 DIAGNOSIS — I428 Other cardiomyopathies: Secondary | ICD-10-CM | POA: Insufficient documentation

## 2013-05-25 DIAGNOSIS — R0602 Shortness of breath: Secondary | ICD-10-CM

## 2013-05-25 NOTE — Progress Notes (Signed)
Echocardiogram performed.  

## 2013-05-30 ENCOUNTER — Encounter: Payer: Self-pay | Admitting: *Deleted

## 2013-06-05 ENCOUNTER — Telehealth: Payer: Self-pay | Admitting: Nurse Practitioner

## 2013-06-05 ENCOUNTER — Other Ambulatory Visit: Payer: Self-pay

## 2013-06-05 MED ORDER — FUROSEMIDE 20 MG PO TABS: ORAL_TABLET | ORAL | Status: DC

## 2013-06-05 NOTE — Telephone Encounter (Signed)
New problem   Pt stated he was returning a call from [facility] about his test results. Please call

## 2013-06-05 NOTE — Telephone Encounter (Signed)
Returned call to patient echo results given.Advised may take lasix 20 mg if needed for swelling and if has a weight gain of 3 lbs within 24 hours.Advised no salt and keep appointment with Norma Fredrickson NP 06/14/13.

## 2013-06-14 ENCOUNTER — Encounter: Payer: Self-pay | Admitting: Nurse Practitioner

## 2013-06-14 ENCOUNTER — Ambulatory Visit (INDEPENDENT_AMBULATORY_CARE_PROVIDER_SITE_OTHER): Payer: Self-pay | Admitting: Nurse Practitioner

## 2013-06-14 VITALS — BP 140/88 | HR 64 | Ht 68.0 in | Wt 225.0 lb

## 2013-06-14 DIAGNOSIS — I5022 Chronic systolic (congestive) heart failure: Secondary | ICD-10-CM

## 2013-06-14 MED ORDER — FUROSEMIDE 20 MG PO TABS: 20.0000 mg | ORAL_TABLET | Freq: Every day | ORAL | Status: DC

## 2013-06-14 NOTE — Progress Notes (Signed)
I have not seen him in a long time.  Get him followup with me and would arrange for a stress myoview.

## 2013-06-14 NOTE — Patient Instructions (Addendum)
Stop the ibuprofen - take tylenol instead.  Go and pick up the Lasix at the drug store - take this every day. This should help get your weight back down  When you can afford it - get some scales to weigh on at home each morning  Check lab in one week  Restrict your salt  I will see you in 2 to 3 weeks  Call the [facility] office at [phone] if you have any questions, problems or concerns.

## 2013-06-14 NOTE — Progress Notes (Signed)
Roger Crosby Date of Birth: July 28, 1961 Medical Record #[id number]  History of Present Illness: [place] is seen back today for a one month check. Seen for Dr. Shirlee Latch. Has a history of HTN, LBBB, mild NICM with EF 45 to 50% and mild nonobstructive CAD per cath back in 04/2010.   Seen last month after a year absence. Refilled medicines. We got an echo as well. Unfortunately, his EF has dropped. Now 25%.   Comes back today. Here alone. Does not feel good. Weight is up 9 pounds. Using Ibuprofen. Trying to cut back on the salt. Feels more short of breath.  A little bloated. No swelling. No scales at home. Did not pick up his prescription for the Lasix - just forgot. He is working for a moving company - pretty physical work. He has been in the heat. Only able to take half dose of his lisinopril due to headache.    Current Outpatient Prescriptions  Medication Sig Dispense Refill  . aspirin EC 81 MG tablet Take 1 tablet (81 mg total) by mouth daily.      . carvedilol (COREG) 6.25 MG tablet Take 1 tablet (6.25 mg total) by mouth 2 (two) times daily.  60 tablet  6  . cyclobenzaprine (FLEXERIL) 10 MG tablet Take 1 tablet (10 mg total) by mouth 3 (three) times daily as needed for muscle spasms.  30 tablet  0  . lisinopril (PRINIVIL,ZESTRIL) 20 MG tablet Take 10 mg by mouth daily.      Marland Kitchen omeprazole (PRILOSEC) 20 MG capsule Take 1 capsule (20 mg total) by mouth daily.  30 capsule  6  . traMADol-acetaminophen (ULTRACET) 37.5-325 MG per tablet 2 tabs po QID prn pain  16 tablet  0  . furosemide (LASIX) 20 MG tablet Take 20 mg if needed for swelling and if gains 3 lbs within 24 hrs  30 tablet  3   No current facility-administered medications for this visit.    No Known Allergies  Past Medical History  Diagnosis Date  . HTN (hypertension)   . GERD (gastroesophageal reflux disease)   . LBP (low back pain)   . LBBB (left bundle branch block)     dx 2011  . Hx of echocardiogram     echo 5/11: EF 45-50%,  mild HK of Ap AS wall, ap Ant wall and true apex (LAD distribution), mild LAE  . NICM (nonischemic cardiomyopathy)   . CAD (coronary artery disease)     nonobstructive by [facility] 6/11: oOM1 40%, mild luminal irregs elsewhere  . CKD (chronic kidney disease)     No past surgical history on file.  History  Smoking status  . Current Some Day Smoker  Smokeless tobacco  . Not on file    History  Alcohol Use: Not on file    No family history on file.  Review of Systems: The review of systems is per the HPI.  All other systems were reviewed and are negative.  Physical Exam: BP 140/88  Pulse 64  Ht 5\' 8"  (1.727 m)  Wt 225 lb (102.059 kg)  BMI 34.22 kg/m2 Patient is very pleasant and in no acute distress. Weight is up 9 pounds. Skin is warm and dry. Color is normal.  HEENT is unremarkable. Normocephalic/atraumatic. PERRL. Sclera are nonicteric. Neck is supple. No masses. No JVD. Lungs are clear. Cardiac exam shows a regular rate and rhythm. Abdomen is soft. Extremities are without edema. Gait and ROM are intact. No gross neurologic deficits noted.  LABORATORY  DATA: Lab Results  Component Value Date   WBC 9.9 05/22/2013   HGB 15.8 05/22/2013   HCT 49.0 05/22/2013   PLT 302.0 05/22/2013   GLUCOSE 85 05/22/2013   CHOL 186 01/30/2013   TRIG 62.0 01/30/2013   HDL 38.60* 01/30/2013   LDLDIRECT 155.3 04/24/2010   LDLCALC 135* 01/30/2013   ALT 29 01/30/2013   AST 18 01/30/2013   NA 139 05/22/2013   K 4.2 05/22/2013   CL 105 05/22/2013   CREATININE 1.7* 05/22/2013   BUN 13 05/22/2013   CO2 28 05/22/2013   TSH 2.358 01/28/2009   PSA 0.43 01/07/2010   INR 1.2 ratio* 04/24/2010   Echo Study Conclusions  - Left ventricle: LVEF is approximately 25 to 30% with inferior/inferoseptal akinesis; hypokinesis elsewhere. The cavity size was normal. Wall thickness was increased in a pattern of mild LVH. - Left atrium: The atrium was mildly dilated. - Pericardium, extracardiac: A small pericardial effusion was  identified.  Assessment / Plan: 1. Systolic heart failure - with EF now down to 35 to 30%. I have reordered his Lasix - he will pick up today and start taking. Stop the NSAID use as well and try to restrict salt. Try to obtain scales when he can afford. He is still trying to get insurance as well. May need to consider stress testing as well. Would like for Dr. Shirlee Latch to review.   2. HTN - not able to tolerate increase in ACE. Lasix added daily today. Hopefully this will help with his BP.   3. Nonobstructive CAD - no chest pain reported but may need to consider Myoview.   4. Tobacco abuse  We will check a BMET in a week. I will see him in 2 to 3 weeks. Compliance because of no insurance/cost will be an issue.   Patient is agreeable to this plan and will call if any problems develop in the interim.   Rosalio Macadamia, RN, ANP-C [facility] [address] [place], [place]  [address]

## 2013-06-20 ENCOUNTER — Other Ambulatory Visit: Payer: Self-pay | Admitting: *Deleted

## 2013-06-20 ENCOUNTER — Other Ambulatory Visit (INDEPENDENT_AMBULATORY_CARE_PROVIDER_SITE_OTHER): Payer: Self-pay

## 2013-06-20 DIAGNOSIS — I5022 Chronic systolic (congestive) heart failure: Secondary | ICD-10-CM

## 2013-06-20 LAB — BASIC METABOLIC PANEL
BUN: 14 mg/dL (ref 6–23)
CO2: 26 mEq/L (ref 19–32)
Calcium: 9.2 mg/dL (ref 8.4–10.5)
Chloride: 107 mEq/L (ref 96–112)
Creatinine, Ser: 1.5 mg/dL (ref 0.4–1.5)
GFR: 64.12 mL/min (ref >60.00)
Glucose, Bld: 103 mg/dL — ABNORMAL HIGH (ref 70–99)
Potassium: 3.9 mEq/L (ref 3.5–5.1)
Sodium: 139 mEq/L (ref 135–145)

## 2013-07-05 ENCOUNTER — Ambulatory Visit: Payer: Self-pay | Admitting: Nurse Practitioner

## 2013-07-31 HISTORY — PX: CARDIAC CATHETERIZATION: SHX172

## 2013-08-01 ENCOUNTER — Ambulatory Visit (INDEPENDENT_AMBULATORY_CARE_PROVIDER_SITE_OTHER): Payer: Self-pay | Admitting: Nurse Practitioner

## 2013-08-01 ENCOUNTER — Other Ambulatory Visit: Payer: Self-pay | Admitting: Nurse Practitioner

## 2013-08-01 ENCOUNTER — Encounter: Payer: Self-pay | Admitting: Nurse Practitioner

## 2013-08-01 VITALS — BP 150/90 | HR 84 | Ht 68.0 in | Wt 212.8 lb

## 2013-08-01 DIAGNOSIS — R079 Chest pain, unspecified: Secondary | ICD-10-CM

## 2013-08-01 DIAGNOSIS — R0609 Other forms of dyspnea: Secondary | ICD-10-CM

## 2013-08-01 DIAGNOSIS — Z01810 Encounter for preprocedural cardiovascular examination: Secondary | ICD-10-CM

## 2013-08-01 DIAGNOSIS — R0600 Dyspnea, unspecified: Secondary | ICD-10-CM

## 2013-08-01 LAB — CBC WITH DIFFERENTIAL/PLATELET
Basophils Absolute: 0 10*3/uL (ref 0.0–0.1)
Basophils Relative: 0.2 % (ref 0.0–3.0)
Eosinophils Absolute: 0.1 10*3/uL (ref 0.0–0.7)
Eosinophils Relative: 1 % (ref 0.0–5.0)
HCT: 43.1 % (ref 39.0–52.0)
Hemoglobin: 14 g/dL (ref 13.0–17.0)
Lymphocytes Relative: 19.4 % (ref 12.0–46.0)
Lymphs Abs: 1.6 10*3/uL (ref 0.7–4.0)
MCHC: 32.6 g/dL (ref 30.0–36.0)
MCV: 80.2 fl (ref 78.0–100.0)
Monocytes Absolute: 0.7 10*3/uL (ref 0.1–1.0)
Monocytes Relative: 8.5 % (ref 3.0–12.0)
Neutro Abs: 5.8 10*3/uL (ref 1.4–7.7)
Neutrophils Relative %: 70.9 % (ref 43.0–77.0)
Platelets: 260 10*3/uL (ref 150.0–400.0)
RBC: 5.37 Mil/uL (ref 4.22–5.81)
RDW: 15.7 % — ABNORMAL HIGH (ref 11.5–14.6)
WBC: 8.1 10*3/uL (ref 4.5–10.5)

## 2013-08-01 LAB — BASIC METABOLIC PANEL
BUN: 13 mg/dL (ref 6–23)
CO2: 24 mEq/L (ref 19–32)
Calcium: 8.6 mg/dL (ref 8.4–10.5)
Chloride: 109 mEq/L (ref 96–112)
Creatinine, Ser: 1.4 mg/dL (ref 0.4–1.5)
GFR: 67.77 mL/min (ref >60.00)
Glucose, Bld: 97 mg/dL (ref 70–99)
Potassium: 3.7 mEq/L (ref 3.5–5.1)
Sodium: 136 mEq/L (ref 135–145)

## 2013-08-01 LAB — PROTIME-INR
INR: 1 ratio (ref 0.8–1.0)
Prothrombin Time: 10.6 s (ref 10.2–12.4)

## 2013-08-01 LAB — BRAIN NATRIURETIC PEPTIDE: Pro B Natriuretic peptide (BNP): 48 pg/mL (ref 0.0–100.0)

## 2013-08-01 LAB — APTT: aPTT: 28.9 s — ABNORMAL HIGH (ref 21.7–28.8)

## 2013-08-01 MED ORDER — LISINOPRIL 20 MG PO TABS: 10.0000 mg | ORAL_TABLET | Freq: Two times a day (BID) | ORAL | Status: DC

## 2013-08-01 NOTE — Patient Instructions (Addendum)
Increase the Lisinopril to 10 mg two times a day  Stay on your other medicines  We are checking labs today  You need to call Financial Counseling at [phone] to apply for financial assistance  You are scheduled for a cardiac catheterization on Friday, September 5th with Dr. Shirlee Latch or associate.  Go to [facility] 2nd Floor Short Stay on Friday, September 5th at 8:30 am.  Enter thru the [employer] entrance A No food or drink after midnight on Thursday. You may take your medications with a sip of water on the day of your procedure.    Coronary Angiography Coronary angiography is an X-ray procedure used to look at the arteries in the heart. In this procedure, a dye is injected through a long, hollow tube (catheter). The catheter is about the size of a piece of cooked spaghetti. The catheter injects a dye into an artery in your groin. X-rays are then taken to show if there is a blockage in the arteries of your heart. BEFORE THE PROCEDURE   Let your caregiver know if you have allergies to shellfish or contrast dye. Also let your caregiver know if you have kidney problems or failure.  Do not eat or drink starting from midnight up to the time of the procedure, or as directed.  You may drink enough water to take your medications the morning of the procedure if you were instructed to do so.  You should be at the hospital or outpatient facility where the procedure is to be done 60 minutes prior to the procedure or as directed. PROCEDURE  You may be given an IV medication to help you relax before the procedure.  You will be prepared for the procedure by washing and shaving the area where the catheter will be inserted. This is usually done in the groin but may be done in the fold of your arm by your elbow.  A medicine will be given to numb your groin where the catheter will be inserted.  A specially trained doctor will insert the catheter into an artery in your groin. The catheter is guided  by using a special type of X-ray (fluoroscopy) to the blood vessel being examined.  A special dye is then injected into the catheter and X-rays are taken. The dye helps to show where any narrowing or blockages are located in the heart arteries. AFTER THE PROCEDURE   After the procedure you will be kept in bed lying flat for several hours. You will be instructed to not bend or cross your legs.  The groin insertion site will be watched and checked frequently.  The pulse in your feet will be checked frequently.  Additional blood tests, X-rays and an EKG may be done.  You may stay in the hospital overnight for observation. SEEK IMMEDIATE MEDICAL CARE IF:   You develop chest pain, shortness of breath, feel faint, or pass out.  There is bleeding, swelling, or drainage from the catheter insertion site.  You develop pain, discoloration, coldness, or severe bruising in the leg or area where the catheter was inserted.  You have a fever. Document Released: 05/23/2003 Document Revised: 02/08/2012 Document Reviewed: 07/11/2008 [facility] Patient Information 2014 [place], [place].

## 2013-08-01 NOTE — Progress Notes (Signed)
 Roger Crosby Date of Birth: 11/06/1961 Medical Record #[id number]  History of Present Illness: Roger Crosby is seen back today for a 6 week check. Seen for Dr. McLean. Has a history of HTN, LBBB, mild NICM with Ef 45 to 50% and mild nonobstructive CAD per cath in June of 2011.   Seen back in June of 2014 after a year's absence to refill medicines. Echo updated - EF now down to 25%. Trying to titrate medicines. Dr. McLean has suggested Myoview - patient with no insurance.   Comes back today (was to have been here 3 weeks ago). Still doing poorly. Short of breath with little activity. Now with chest tightness with activity. All exertional related. 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 Still trying to get Obamacare but it has been futile so far. Weight is down but he remains symptomatic. Some PND noted. No swelling. Long smoking history.   Current Outpatient Prescriptions  Medication Sig Dispense Refill  . aspirin EC 81 MG tablet Take 1 tablet (81 mg total) by mouth daily.      . carvedilol (COREG) 6.25 MG tablet Take 1 tablet (6.25 mg total) by mouth 2 (two) times daily.  60 tablet  6  . cyclobenzaprine (FLEXERIL) 10 MG tablet Take 1 tablet (10 mg total) by mouth 3 (three) times daily as needed for muscle spasms.  30 tablet  0  . furosemide (LASIX) 20 MG tablet Take 1 tablet (20 mg total) by mouth daily.  30 tablet  3  . lisinopril (PRINIVIL,ZESTRIL) 20 MG tablet Take 0.5 tablets (10 mg total) by mouth 2 (two) times daily.      . omeprazole (PRILOSEC) 20 MG capsule Take 1 capsule (20 mg total) by mouth daily.  30 capsule  6  . traMADol-acetaminophen (ULTRACET) 37.5-325 MG per tablet 2 tabs po QID prn pain  16 tablet  0   No current facility-administered medications for this visit.    No Known Allergies  Past Medical History  Diagnosis Date  .  HTN (hypertension)   . GERD (gastroesophageal reflux disease)   . LBP (low back pain)   . LBBB (left bundle branch block)     dx 2011  . Hx of echocardiogram     echo 5/11: EF 45-50%, mild HK of Ap AS wall, ap Ant wall and true apex (LAD distribution), mild LAE  . NICM (nonischemic cardiomyopathy)   . CAD (coronary artery disease)     nonobstructive by [facility] 6/11: oOM1 40%, mild luminal irregs elsewhere  . CKD (chronic kidney disease)     History reviewed. No pertinent past surgical history.  History  Smoking status  . Current Some Day Smoker  Smokeless tobacco  . Not on file    History  Alcohol Use No    Family History  Problem Relation Age of Onset  . Heart attack Mother   . Heart disease Mother   . Heart attack Father   . Heart disease Father     Review of Systems: The review of systems is per the HPI.  All other systems were reviewed and are negative.  Physical Exam: BP 150/90  Pulse 84  Ht 5' 8" (1.727 m)  Wt 212 lb 12.8 oz (96.525 kg)  BMI 32.36 kg/m2 Patient is very pleasant and in no acute distress. Skin   is warm and dry. Color is normal.  HEENT is unremarkable. Normocephalic/atraumatic. PERRL. Sclera are nonicteric. Neck is supple. No masses. No JVD. Lungs are clear. Cardiac exam shows a regular rate and rhythm. Abdomen is soft. Extremities are without edema. Gait and ROM are intact. No gross neurologic deficits noted.  LABORATORY DATA:  Lab Results  Component Value Date   WBC 9.9 05/22/2013   HGB 15.8 05/22/2013   HCT 49.0 05/22/2013   PLT 302.0 05/22/2013   GLUCOSE 103* 06/20/2013   CHOL 186 01/30/2013   TRIG 62.0 01/30/2013   HDL 38.60* 01/30/2013   LDLDIRECT 155.3 04/24/2010   LDLCALC 135* 01/30/2013   ALT 29 01/30/2013   AST 18 01/30/2013   NA 139 06/20/2013   K 3.9 06/20/2013   CL 107 06/20/2013   CREATININE 1.5 06/20/2013   BUN 14 06/20/2013   CO2 26 06/20/2013   TSH 2.358 01/28/2009   PSA 0.43 01/07/2010   INR 1.2 ratio* 04/24/2010   Pro BNP is 86.   Study  Conclusions  - Left ventricle: LVEF is approximately 25 to 30% with inferior/inferoseptal akinesis; hypokinesis elsewhere. The cavity size was normal. Wall thickness was increased in a pattern of mild LVH. - Left atrium: The atrium was mildly dilated. - Pericardium, extracardiac: A small pericardial effusion was identified.   Assessment / Plan: 1. Systolic heart failure - EF down to 25 % - he is agreeable to trying to increase the ACE to BID. Last BNP was normal - may end up needing pulmonary referral as well.   2. Chest pain - had mild to moderate disease 3 years ago - still smoking - lots of CV risk factors - LBBB on EKG - have talked with Roger Crosby in [employer] [facility] - will proceed with cardiac cath - he will apply for assistance in the interim. He is going to need more objective testing and may need to be considering disability.   3. HTN - ACE is increased today.   Cardiac cath is arranged for Dr. McLean for later this week. Check labs today. Procedure has been discussed and he is willing to proceed. He has not been given a return visit here yet. Further disposition per his cath results.   Patient is agreeable to this plan and will call if any problems develop in the interim.   Roger Crosby C. Roger Schmiesing, RN, ANP-C [facility] [address] [place], [place]  [address]  

## 2013-08-04 ENCOUNTER — Ambulatory Visit (HOSPITAL_COMMUNITY)
Admission: RE | Admit: 2013-08-04 | Discharge: 2013-08-04 | Disposition: A | Discharge status: Home / Self Care | Payer: Medicaid Other | Source: Ambulatory Visit | Attending: Cardiology | Admitting: Cardiology

## 2013-08-04 ENCOUNTER — Encounter (HOSPITAL_COMMUNITY): Payer: Self-pay | Admitting: Pharmacy Technician

## 2013-08-04 ENCOUNTER — Encounter (HOSPITAL_COMMUNITY)
Admission: RE | Disposition: A | Discharge status: Home / Self Care | Payer: Self-pay | Source: Ambulatory Visit | Attending: Cardiology

## 2013-08-04 DIAGNOSIS — R079 Chest pain, unspecified: Secondary | ICD-10-CM

## 2013-08-04 DIAGNOSIS — F17200 Nicotine dependence, unspecified, uncomplicated: Secondary | ICD-10-CM | POA: Insufficient documentation

## 2013-08-04 DIAGNOSIS — Z01810 Encounter for preprocedural cardiovascular examination: Secondary | ICD-10-CM

## 2013-08-04 DIAGNOSIS — M545 Low back pain, unspecified: Secondary | ICD-10-CM | POA: Insufficient documentation

## 2013-08-04 DIAGNOSIS — Z79899 Other long term (current) drug therapy: Secondary | ICD-10-CM | POA: Insufficient documentation

## 2013-08-04 DIAGNOSIS — I129 Hypertensive chronic kidney disease with stage 1 through stage 4 chronic kidney disease, or unspecified chronic kidney disease: Secondary | ICD-10-CM | POA: Insufficient documentation

## 2013-08-04 DIAGNOSIS — I428 Other cardiomyopathies: Secondary | ICD-10-CM | POA: Insufficient documentation

## 2013-08-04 DIAGNOSIS — N189 Chronic kidney disease, unspecified: Secondary | ICD-10-CM | POA: Insufficient documentation

## 2013-08-04 DIAGNOSIS — K219 Gastro-esophageal reflux disease without esophagitis: Secondary | ICD-10-CM | POA: Insufficient documentation

## 2013-08-04 DIAGNOSIS — I447 Left bundle-branch block, unspecified: Secondary | ICD-10-CM | POA: Insufficient documentation

## 2013-08-04 DIAGNOSIS — Z7982 Long term (current) use of aspirin: Secondary | ICD-10-CM | POA: Insufficient documentation

## 2013-08-04 DIAGNOSIS — I5020 Unspecified systolic (congestive) heart failure: Secondary | ICD-10-CM | POA: Insufficient documentation

## 2013-08-04 HISTORY — PX: LEFT HEART CATHETERIZATION WITH CORONARY ANGIOGRAM: SHX5451

## 2013-08-04 SURGERY — LEFT HEART CATHETERIZATION WITH CORONARY ANGIOGRAM
Anesthesia: LOCAL

## 2013-08-04 MED ORDER — FENTANYL CITRATE 0.05 MG/ML IJ SOLN
INTRAMUSCULAR | Status: AC
  Filled 2013-08-04: qty 2

## 2013-08-04 MED ORDER — HEPARIN (PORCINE) IN NACL 2-0.9 UNIT/ML-% IJ SOLN
INTRAMUSCULAR | Status: AC
  Filled 2013-08-04: qty 1000

## 2013-08-04 MED ORDER — SODIUM CHLORIDE 0.9 % IV SOLN: INTRAVENOUS | Status: DC

## 2013-08-04 MED ORDER — ASPIRIN 81 MG PO CHEW: 324.0000 mg | CHEWABLE_TABLET | ORAL | Status: DC

## 2013-08-04 MED ORDER — SODIUM CHLORIDE 0.9 % IJ SOLN: 3.0000 mL | Freq: Two times a day (BID) | INTRAMUSCULAR | Status: DC

## 2013-08-04 MED ORDER — VERAPAMIL HCL 2.5 MG/ML IV SOLN
INTRAVENOUS | Status: AC
  Filled 2013-08-04: qty 2

## 2013-08-04 MED ORDER — SODIUM CHLORIDE 0.9 % IV SOLN: 250.0000 mL | INTRAVENOUS | Status: DC | PRN

## 2013-08-04 MED ORDER — HEPARIN SODIUM (PORCINE) 1000 UNIT/ML IJ SOLN
INTRAMUSCULAR | Status: AC
  Filled 2013-08-04: qty 1

## 2013-08-04 MED ORDER — ONDANSETRON HCL 4 MG/2ML IJ SOLN: 4.0000 mg | Freq: Four times a day (QID) | INTRAMUSCULAR | Status: DC | PRN

## 2013-08-04 MED ORDER — ACETAMINOPHEN 325 MG PO TABS: 650.0000 mg | ORAL_TABLET | ORAL | Status: DC | PRN

## 2013-08-04 MED ORDER — SODIUM CHLORIDE 0.9 % IJ SOLN: 3.0000 mL | INTRAMUSCULAR | Status: DC | PRN

## 2013-08-04 MED ORDER — NITROGLYCERIN 0.2 MG/ML ON CALL CATH LAB
INTRAVENOUS | Status: AC
  Filled 2013-08-04: qty 1

## 2013-08-04 MED ORDER — DIAZEPAM 5 MG PO TABS: 10.0000 mg | ORAL_TABLET | ORAL | Status: DC

## 2013-08-04 MED ORDER — LIDOCAINE HCL (PF) 1 % IJ SOLN
INTRAMUSCULAR | Status: AC
  Filled 2013-08-04: qty 30

## 2013-08-04 MED ORDER — MIDAZOLAM HCL 2 MG/2ML IJ SOLN
INTRAMUSCULAR | Status: AC
  Filled 2013-08-04: qty 2

## 2013-08-04 NOTE — Interval H&P Note (Signed)
History and Physical Interval Note:  08/04/2013 10:44 AM  Roger Crosby  has presented today for surgery, with the diagnosis of cp  The various methods of treatment have been discussed with the patient and family. After consideration of risks, benefits and other options for treatment, the patient has consented to  Procedure(s): LEFT HEART CATHETERIZATION WITH CORONARY ANGIOGRAM (N/A) as a surgical intervention .  The patient's history has been reviewed, patient examined, no change in status, stable for surgery.  I have reviewed the patient's chart and labs.  Questions were answered to the patient's satisfaction.     Sady Monaco [employer]

## 2013-08-04 NOTE — CV Procedure (Signed)
   Cardiac Catheterization Procedure Note  Name: Roger Crosby MRN: [id number] DOB: [birth year]  Procedure: Left Heart Cath, Selective Coronary Angiography  Indication:  Chest pain, fall in EF from 45% to 25%.  Assess for CAD as cause of worsening cardiomyopathy.    Procedural Details: The right wrist was prepped, draped, and anesthetized with 1% lidocaine. Using the modified Seldinger technique, a 5 French sheath was introduced into the right radial artery. 3 mg of verapamil was administered through the sheath, weight-based unfractionated heparin was administered intravenously. JR4 catheter was used to engage RCA and LCA.  No LV gram with CKD and elevated LVEDP. Catheter exchanges were performed over an exchange length guidewire. There were no immediate procedural complications. A TR band was used for radial hemostasis at the completion of the procedure.  The patient was transferred to the post catheterization recovery area for further monitoring.  Procedural Findings: Hemodynamics: AO 128/84 LV 121/30  Coronary angiography: Coronary dominance: right  Left mainstem: No significant disease.  Left anterior descending (LAD): Left main gives rise to LAD and a ramus-type vessel.  There is no significant coronary disease.   Left circumflex (LCx): Anomalous origin.  Arises as a branch from the RCA.  No significant disease.   Right coronary artery (RCA): High anterior origin off right cusp.  Gives rise to the LCx (early branch). Mild luminal irregularities in the RCA.   Left ventriculography: Not done, CKD and elevated LVEDP.   Final Conclusions:  Nonischemic cardiomyopathy.  Elevated LVEDP.   - Increase Lasix to 40 mg daily and add spironolactone 12.5 mg daily.  BMET in 1 week. - Continue Coreg and lisinopril.  - Followup in office in 2 wks.  If no improvement in LV function with repeat echo in 12/15, will need CRT-D (has LBBB).  Aggressive medical management of cardiomyopathy until that  time.   [name] 08/04/2013, 11:23 AM

## 2013-08-04 NOTE — H&P (View-Only) (Signed)
Rodney Langton Date of Birth: June 10, 1961 Medical Record #[id number]  History of Present Illness: Amir is seen back today for a 6 week check. Seen for Dr. Shirlee Latch. Has a history of HTN, LBBB, mild NICM with Ef 45 to 50% and mild nonobstructive CAD per cath in June of 2011.   Seen back in June of 2014 after a year's absence to refill medicines. Echo updated - EF now down to 25%. Trying to titrate medicines. Dr. Shirlee Latch has suggested Myoview - patient with no insurance.   Comes back today (was to have been here 3 weeks ago). Still doing poorly. Short of breath with little activity. Now with chest tightness with activity. All exertional related. 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 Still trying to get Obamacare but it has been futile so far. Weight is down but he remains symptomatic. Some PND noted. No swelling. Long smoking history.   Current Outpatient Prescriptions  Medication Sig Dispense Refill  . aspirin EC 81 MG tablet Take 1 tablet (81 mg total) by mouth daily.      . carvedilol (COREG) 6.25 MG tablet Take 1 tablet (6.25 mg total) by mouth 2 (two) times daily.  60 tablet  6  . cyclobenzaprine (FLEXERIL) 10 MG tablet Take 1 tablet (10 mg total) by mouth 3 (three) times daily as needed for muscle spasms.  30 tablet  0  . furosemide (LASIX) 20 MG tablet Take 1 tablet (20 mg total) by mouth daily.  30 tablet  3  . lisinopril (PRINIVIL,ZESTRIL) 20 MG tablet Take 0.5 tablets (10 mg total) by mouth 2 (two) times daily.      Marland Kitchen omeprazole (PRILOSEC) 20 MG capsule Take 1 capsule (20 mg total) by mouth daily.  30 capsule  6  . traMADol-acetaminophen (ULTRACET) 37.5-325 MG per tablet 2 tabs po QID prn pain  16 tablet  0   No current facility-administered medications for this visit.    No Known Allergies  Past Medical History  Diagnosis Date  .  HTN (hypertension)   . GERD (gastroesophageal reflux disease)   . LBP (low back pain)   . LBBB (left bundle branch block)     dx 2011  . Hx of echocardiogram     echo 5/11: EF 45-50%, mild HK of Ap AS wall, ap Ant wall and true apex (LAD distribution), mild LAE  . NICM (nonischemic cardiomyopathy)   . CAD (coronary artery disease)     nonobstructive by [facility] 6/11: oOM1 40%, mild luminal irregs elsewhere  . CKD (chronic kidney disease)     History reviewed. No pertinent past surgical history.  History  Smoking status  . Current Some Day Smoker  Smokeless tobacco  . Not on file    History  Alcohol Use No    Family History  Problem Relation Age of Onset  . Heart attack Mother   . Heart disease Mother   . Heart attack Father   . Heart disease Father     Review of Systems: The review of systems is per the HPI.  All other systems were reviewed and are negative.  Physical Exam: BP 150/90  Pulse 84  Ht 5\' 8"  (1.727 m)  Wt 212 lb 12.8 oz (96.525 kg)  BMI 32.36 kg/m2 Patient is very pleasant and in no acute distress. Skin  is warm and dry. Color is normal.  HEENT is unremarkable. Normocephalic/atraumatic. PERRL. Sclera are nonicteric. Neck is supple. No masses. No JVD. Lungs are clear. Cardiac exam shows a regular rate and rhythm. Abdomen is soft. Extremities are without edema. Gait and ROM are intact. No gross neurologic deficits noted.  LABORATORY DATA:  Lab Results  Component Value Date   WBC 9.9 05/22/2013   HGB 15.8 05/22/2013   HCT 49.0 05/22/2013   PLT 302.0 05/22/2013   GLUCOSE 103* 06/20/2013   CHOL 186 01/30/2013   TRIG 62.0 01/30/2013   HDL 38.60* 01/30/2013   LDLDIRECT 155.3 04/24/2010   LDLCALC 135* 01/30/2013   ALT 29 01/30/2013   AST 18 01/30/2013   NA 139 06/20/2013   K 3.9 06/20/2013   CL 107 06/20/2013   CREATININE 1.5 06/20/2013   BUN 14 06/20/2013   CO2 26 06/20/2013   TSH 2.358 01/28/2009   PSA 0.43 01/07/2010   INR 1.2 ratio* 04/24/2010   Pro BNP is 86.   Study  Conclusions  - Left ventricle: LVEF is approximately 25 to 30% with inferior/inferoseptal akinesis; hypokinesis elsewhere. The cavity size was normal. Wall thickness was increased in a pattern of mild LVH. - Left atrium: The atrium was mildly dilated. - Pericardium, extracardiac: A small pericardial effusion was identified.   Assessment / Plan: 1. Systolic heart failure - EF down to 25 % - he is agreeable to trying to increase the ACE to BID. Last BNP was normal - may end up needing pulmonary referral as well.   2. Chest pain - had mild to moderate disease 3 years ago - still smoking - lots of CV risk factors - LBBB on EKG - have talked with Tammy in [employer] - will proceed with cardiac cath - he will apply for assistance in the interim. He is going to need more objective testing and may need to be considering disability.   3. HTN - ACE is increased today.   Cardiac cath is arranged for Dr. Shirlee Latch for later this week. Check labs today. Procedure has been discussed and he is willing to proceed. He has not been given a return visit here yet. Further disposition per his cath results.   Patient is agreeable to this plan and will call if any problems develop in the interim.   Rosalio Macadamia, RN, ANP-C [facility] [address] [place], [place]  [address]

## 2013-08-11 ENCOUNTER — Other Ambulatory Visit: Payer: Self-pay

## 2013-08-15 ENCOUNTER — Other Ambulatory Visit (INDEPENDENT_AMBULATORY_CARE_PROVIDER_SITE_OTHER): Payer: Medicaid Other

## 2013-08-15 DIAGNOSIS — I5022 Chronic systolic (congestive) heart failure: Secondary | ICD-10-CM

## 2013-08-15 LAB — BASIC METABOLIC PANEL
BUN: 18 mg/dL (ref 6–23)
CO2: 26 mEq/L (ref 19–32)
Calcium: 8.9 mg/dL (ref 8.4–10.5)
Chloride: 109 mEq/L (ref 96–112)
Creatinine, Ser: 1.6 mg/dL — ABNORMAL HIGH (ref 0.4–1.5)
GFR: 60.75 mL/min (ref >60.00)
Glucose, Bld: 90 mg/dL (ref 70–99)
Potassium: 3.9 mEq/L (ref 3.5–5.1)
Sodium: 141 mEq/L (ref 135–145)

## 2013-08-16 ENCOUNTER — Telehealth: Payer: Self-pay | Admitting: Cardiology

## 2013-08-16 NOTE — Telephone Encounter (Signed)
New problem    Patient returning call back to nurse.

## 2013-08-17 ENCOUNTER — Encounter: Payer: Self-pay | Admitting: *Deleted

## 2013-08-22 ENCOUNTER — Encounter: Payer: Self-pay | Admitting: Cardiology

## 2013-08-22 ENCOUNTER — Ambulatory Visit (INDEPENDENT_AMBULATORY_CARE_PROVIDER_SITE_OTHER): Payer: Medicaid Other | Admitting: Cardiology

## 2013-08-22 VITALS — BP 140/88 | HR 60 | Ht 68.0 in | Wt 213.4 lb

## 2013-08-22 DIAGNOSIS — E785 Hyperlipidemia, unspecified: Secondary | ICD-10-CM

## 2013-08-22 DIAGNOSIS — I5022 Chronic systolic (congestive) heart failure: Secondary | ICD-10-CM

## 2013-08-22 DIAGNOSIS — I509 Heart failure, unspecified: Secondary | ICD-10-CM

## 2013-08-22 DIAGNOSIS — I10 Essential (primary) hypertension: Secondary | ICD-10-CM

## 2013-08-22 MED ORDER — CARVEDILOL 6.25 MG PO TABS: ORAL_TABLET | ORAL | Status: DC

## 2013-08-22 MED ORDER — SPIRONOLACTONE 25 MG PO TABS: ORAL_TABLET | ORAL | Status: DC

## 2013-08-22 NOTE — Patient Instructions (Addendum)
Increase coreg (carvedilol) to 9.375mg  two times a day. This will be 1 and 1/2 of a 6.25mg  tablet two times a day.   Start spironolactone 12.5mg  daily. This will be 1/2 of a 25mg  tablet daily.  Your physician recommends that you return for lab work in: 2 weeks--BMET.   Your physician recommends that you schedule a follow-up appointment in: 1 month in the Heart Failure Clinic at the [facility] at [facility].   Try to limit your sodium(salt) intake to 2000mg  daily.  Limit the saturated fat in your diet.

## 2013-08-23 DIAGNOSIS — I5022 Chronic systolic (congestive) heart failure: Secondary | ICD-10-CM | POA: Insufficient documentation

## 2013-08-23 NOTE — Progress Notes (Signed)
Patient ID: Roger Crosby, male   DOB: [birth year], 52 y.o.   MRN: [id number] 52 yo with history of LBBB and nonischemic cardiomyopathy returns for cardiology followup.  Most recent echo showed that EF had fallen to 20-25% with regional wall motion abnormalities.  I did a left heart cath earlier this month that showed no obstructive disease.  He has a nonischemic cardiomyopathy.   Patient continues to be short of breath after walking 200 yards or climbing steps.  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  No orthopnea or PND.  He did not start spironolactone (prescribed after cath).    ECG: NSR, LBBB  Labs (9/14): K 3.9, creatinine 1.6  PMH: 1. Hypertension  2. GERD  3. Low back pain  4. LBBB: Newly noted 2011  5. Nonischemic cardiomyopathy: Echo (5/11): EF 45-50%, mild hypokinesis of the apical anteroseptal wall, apical anterior wall, and true apex (LAD distribution), mild LAE.  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  Echo (6/14) with EF 20-25% with inferior/inferoseptal akinesis and hypokinesis elsewhere.  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  6. CKD   Family History:  Mother deceased CHF,DM,HTN  Father with heart trouble...died age 21.  1 sister Crohn's disease  1 sister healthy  1 brother died Prostate cancer/AIDS...was diagnosed with prostate cancer at age 91.  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  Married  3 kids  Alcohol use-no. None since 2008  Drug use-prior cocaine none since 2008. Denies IVDU.  Nonsmoker Prior smoker none since 2008.   Review of Systems  All systems reviewed and negative except as per HPI.   Current Outpatient Prescriptions  Medication Sig Dispense Refill  . lisinopril (PRINIVIL,ZESTRIL) 20 MG tablet Take 0.5 tablets (10  mg total) by mouth 2 (two) times daily.      Marland Kitchen omeprazole (PRILOSEC) 20 MG capsule Take 20 mg by mouth daily as needed (for stomach).      Marland Kitchen aspirin EC 81 MG tablet Take 1 tablet (81 mg total) by mouth daily.      . carvedilol (COREG) 6.25 MG tablet 1 and 1/2 tablets (total 9.375mg ) two times a day  90 tablet  3  . furosemide (LASIX) 40 MG tablet Take 1 tablet (40 mg total) by mouth daily.      Marland Kitchen spironolactone (ALDACTONE) 25 MG tablet 1/2 tablet (total 12.5mg ) daily  15 tablet  3   No current facility-administered medications for this visit.    BP 140/88  Pulse 60  Ht 5\' 8"  (1.727 m)  Wt 96.802 kg (213 lb 6.6 oz)  BMI 32.46 kg/m2  SpO2 99% General: NAD Neck: No JVD, no thyromegaly or thyroid nodule.  Lungs: Clear to auscultation bilaterally with normal respiratory effort. CV: Nondisplaced PMI.  Heart regular S1/S2, no S3/S4, no murmur.  No peripheral edema.  No carotid bruit.  Normal pedal pulses.  Abdomen: Soft, nontender, no hepatosplenomegaly, no distention.  Neurologic: Alert and oriented x 3.  Psych: Normal affect. Extremities: No clubbing or cyanosis.   Assessment/Plan: 1. Nonischemic cardiomyopathy: No ETOH currently, no drugs.  EF 20-25% with LBBB.  No significant CAD on cath.  NYHA class II-III symptoms but not volume overloaded on exam.  - Continue current Lasix at 40 mg/day.  -  Continue current lisinopril dose.  - Will increase Coreg to 9.375 mg bid and will add spironolactone 12.5 mg daily.   - BMET in 2 wks. - Followup in 1 month for medication titration.  - Echo in 12/15 => if EF remains depressed, will be candidate for CRT-D.  2. CKD: Creatinine has been stable.  Follow closely.  3. HTN: Increasing meds, as above.   [name] 08/23/2013

## 2013-09-05 ENCOUNTER — Other Ambulatory Visit: Payer: Self-pay

## 2013-09-06 ENCOUNTER — Encounter: Payer: Self-pay | Admitting: Cardiology

## 2013-09-06 ENCOUNTER — Ambulatory Visit (INDEPENDENT_AMBULATORY_CARE_PROVIDER_SITE_OTHER): Payer: Medicaid Other | Admitting: *Deleted

## 2013-09-06 DIAGNOSIS — I5022 Chronic systolic (congestive) heart failure: Secondary | ICD-10-CM

## 2013-09-06 LAB — BASIC METABOLIC PANEL
GFR: 73.1 mL/min (ref >60.00)
Glucose, Bld: 109 mg/dL — ABNORMAL HIGH (ref 70–99)
Potassium: 4 mEq/L (ref 3.5–5.1)
Sodium: 141 mEq/L (ref 135–145)

## 2013-09-08 ENCOUNTER — Telehealth: Payer: Self-pay | Admitting: Cardiology

## 2013-09-08 NOTE — Telephone Encounter (Signed)
New message   Going to court for child support--doc took him out of work--need note to take with him

## 2013-09-08 NOTE — Telephone Encounter (Signed)
MSG left that dr/nurse out of office and will get a call back Monday 10/13. Told to call if further need/ number provided.

## 2013-09-08 NOTE — Telephone Encounter (Signed)
Thurston Hole, we can given him this note.

## 2013-09-08 NOTE — Telephone Encounter (Signed)
Follow up     lft wrong number earlier--pls call at [phone]

## 2013-09-08 NOTE — Telephone Encounter (Signed)
Lm to call back ah

## 2013-09-11 ENCOUNTER — Encounter: Payer: Self-pay | Admitting: *Deleted

## 2013-09-11 NOTE — Telephone Encounter (Signed)
Pt aware letter done and left at front desk for pick up.

## 2013-09-11 NOTE — Telephone Encounter (Signed)
Per Dr Ashby Dawes done and left at front desk.

## 2013-09-11 NOTE — Telephone Encounter (Signed)
Spoke with patient. He is requesting letter stating that he is currently not able to work due to his medical condition.

## 2013-09-11 NOTE — Telephone Encounter (Signed)
Correct phone number [phone].

## 2013-09-25 ENCOUNTER — Ambulatory Visit (HOSPITAL_COMMUNITY)
Admission: RE | Admit: 2013-09-25 | Discharge: 2013-09-25 | Disposition: A | Discharge status: Home / Self Care | Payer: Medicaid Other | Source: Ambulatory Visit | Attending: Internal Medicine | Admitting: Internal Medicine

## 2013-09-25 VITALS — BP 122/78 | HR 75 | Wt 216.2 lb

## 2013-09-25 DIAGNOSIS — I509 Heart failure, unspecified: Secondary | ICD-10-CM

## 2013-09-25 DIAGNOSIS — I10 Essential (primary) hypertension: Secondary | ICD-10-CM | POA: Insufficient documentation

## 2013-09-25 DIAGNOSIS — I5022 Chronic systolic (congestive) heart failure: Secondary | ICD-10-CM | POA: Insufficient documentation

## 2013-09-25 MED ORDER — SPIRONOLACTONE 25 MG PO TABS: 25.0000 mg | ORAL_TABLET | Freq: Every day | ORAL | Status: DC

## 2013-09-25 NOTE — Progress Notes (Signed)
Patient ID: Roger Crosby, male   DOB: 1961/09/11, 52 y.o.   MRN: [id number] 52 yo with history of LBBB and nonischemic cardiomyopathy returns for cardiology followup.  Most recent echo showed that EF had fallen to 20-25% with regional wall motion abnormalities.  I did a left heart cath in 9/14 that showed no obstructive disease.  He has a nonischemic cardiomyopathy.   Patient continues to be short of breath after walking 100 yards or climbing steps.  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  No PND.  He does have orthopnea and sleeps on 2 pillows.  At last appointment, he was started on spironolactone and actually thought he was supposed to stop Lasix.  Therefore, he has been off Lasix for about 1 month.     ECG: NSR, LBBB  Labs (9/14): K 3.9, creatinine 1.6 Labs (10/14): K 4, creatinine 1.3  PMH: 1. Hypertension  2. GERD  3. Low back pain  4. LBBB: Newly noted 2011  5. Nonischemic cardiomyopathy: Echo (5/11): EF 45-50%, mild hypokinesis of the apical anteroseptal wall, apical anterior wall, and true apex (LAD distribution), mild LAE.  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  Echo (6/14) with EF 20-25% with inferior/inferoseptal akinesis and hypokinesis elsewhere.  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  6. CKD   Family History:  Mother deceased CHF,DM,HTN  Father with heart trouble...died age 60.  1 sister Crohn's disease  1 sister healthy  1 brother died Prostate cancer/AIDS...was diagnosed with prostate cancer at age 79.  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  Married  3 kids  Alcohol use-no. None since 2008  Drug use-prior cocaine none since 2008. Denies IVDU.  Nonsmoker Prior smoker none since 2008.   Review of Systems  All systems reviewed and  negative except as per HPI.   Current Outpatient Prescriptions  Medication Sig Dispense Refill  . aspirin EC 81 MG tablet Take 1 tablet (81 mg total) by mouth daily.      . carvedilol (COREG) 6.25 MG tablet 1 and 1/2 tablets (total 9.375mg ) two times a day  90 tablet  3  . furosemide (LASIX) 40 MG tablet Take 1 tablet (40 mg total) by mouth daily.      Marland Kitchen lisinopril (PRINIVIL,ZESTRIL) 20 MG tablet Take 0.5 tablets (10 mg total) by mouth 2 (two) times daily.      Marland Kitchen omeprazole (PRILOSEC) 20 MG capsule Take 20 mg by mouth daily as needed (for stomach).      Marland Kitchen spironolactone (ALDACTONE) 25 MG tablet Take 1 tablet (25 mg total) by mouth daily.  30 tablet  3   No current facility-administered medications for this encounter.    BP 122/78  Pulse 75  Wt 216 lb 4 oz (98.09 kg)  BMI 32.89 kg/m2  SpO2 100% General: NAD Neck: No JVD but +HJR, no thyromegaly or thyroid nodule.  Lungs: Clear to auscultation bilaterally with normal respiratory effort. CV: Nondisplaced PMI.  Heart regular S1/S2, no S3/S4, no murmur.  No peripheral edema.  No carotid bruit.  Normal pedal pulses.  Abdomen: Soft, nontender, no hepatosplenomegaly, no distention.  Neurologic: Alert and oriented x 3.  Psych: Normal affect. Extremities: No clubbing or cyanosis.   Assessment/Plan: 1. Nonischemic cardiomyopathy: No ETOH  currently, no drugs.  EF 20-25% with LBBB.  No significant CAD on cath.  NYHA class III symptoms with probably mild volume overload.  He has been off Lasix for about a month.   - Restart Lasix at 40 mg/day.  - Continue current lisinopril and Coreg doses.  - Can increase spironolactone to 25 mg daily.  - BMET/BNP in 2 wks. - Followup in 1 month for medication titration, will likely increase Coreg at that time.  - Echo in 12/15 => if EF remains depressed, will be candidate for CRT-D.  2. CKD: Creatinine has been stable.  Follow closely.  3. HTN: Better control now.    [name] 09/25/2013

## 2013-09-25 NOTE — Patient Instructions (Signed)
Restart Furosemide 40 mg daily  Increase Spironolactone to 25 mg daily  Labs in 10 days  Your physician recommends that you schedule a follow-up appointment in: 1 month

## 2013-10-05 ENCOUNTER — Ambulatory Visit (INDEPENDENT_AMBULATORY_CARE_PROVIDER_SITE_OTHER): Payer: Medicaid Other | Admitting: *Deleted

## 2013-10-05 DIAGNOSIS — I10 Essential (primary) hypertension: Secondary | ICD-10-CM

## 2013-10-05 DIAGNOSIS — I447 Left bundle-branch block, unspecified: Secondary | ICD-10-CM

## 2013-10-05 DIAGNOSIS — R0602 Shortness of breath: Secondary | ICD-10-CM

## 2013-10-05 LAB — BASIC METABOLIC PANEL
CO2: 25 mEq/L (ref 19–32)
Chloride: 105 mEq/L (ref 96–112)
Sodium: 137 mEq/L (ref 135–145)

## 2013-10-06 ENCOUNTER — Other Ambulatory Visit: Payer: Self-pay | Admitting: Physician Assistant

## 2013-10-09 ENCOUNTER — Other Ambulatory Visit: Payer: Self-pay

## 2013-10-23 ENCOUNTER — Ambulatory Visit (HOSPITAL_COMMUNITY)
Admission: RE | Admit: 2013-10-23 | Discharge: 2013-10-23 | Disposition: A | Discharge status: Home / Self Care | Payer: Medicaid Other | Source: Ambulatory Visit | Attending: Internal Medicine | Admitting: Internal Medicine

## 2013-10-23 VITALS — BP 132/74 | HR 85 | Wt 221.8 lb

## 2013-10-23 DIAGNOSIS — I428 Other cardiomyopathies: Secondary | ICD-10-CM | POA: Insufficient documentation

## 2013-10-23 DIAGNOSIS — I10 Essential (primary) hypertension: Secondary | ICD-10-CM

## 2013-10-23 DIAGNOSIS — N189 Chronic kidney disease, unspecified: Secondary | ICD-10-CM | POA: Insufficient documentation

## 2013-10-23 DIAGNOSIS — I5022 Chronic systolic (congestive) heart failure: Secondary | ICD-10-CM

## 2013-10-23 DIAGNOSIS — I447 Left bundle-branch block, unspecified: Secondary | ICD-10-CM

## 2013-10-23 DIAGNOSIS — I129 Hypertensive chronic kidney disease with stage 1 through stage 4 chronic kidney disease, or unspecified chronic kidney disease: Secondary | ICD-10-CM | POA: Insufficient documentation

## 2013-10-23 DIAGNOSIS — I509 Heart failure, unspecified: Secondary | ICD-10-CM

## 2013-10-23 NOTE — Progress Notes (Signed)
Patient ID: Roger Crosby, male   DOB: [birth year], 52 y.o.   MRN: [id number]  52 yo with history of LBBB and nonischemic cardiomyopathy. EF 04/2013 had fallen to 20-25% with regional wall motion abnormalities. Dr. Shirlee Latch did a left heart cath in 07/2013 that showed no obstructive disease.  He has a nonischemic cardiomyopathy.   Follow up: Last visit started lasix 40 mg daily and increased spiro to 25 mg daily. Denies CP, PND, or edema. + orthopnea 2 pillows. +DOE after walking about 100 yds. He has Medicaid and SSI now. No longer working. Not really sure how he is taking medications.   Labs (9/14): K 3.9, creatinine 1.6 Labs (10/14): K 4, creatinine 1.3 Labs (11/14): K+ 4.1, creatinine 1.5  PMH: 1. Hypertension  2. GERD  3. Low back pain  4. LBBB: Newly noted 2011  5. Nonischemic cardiomyopathy: Echo (5/11): EF 45-50%, mild hypokinesis of the apical anteroseptal wall, apical anterior wall, and true apex (LAD distribution), mild LAE.  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  Echo (6/14) with EF 20-25% with inferior/inferoseptal akinesis and hypokinesis elsewhere.  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  6. CKD   Family History:  Mother deceased CHF,DM,HTN  Father with heart trouble...died age 57.  1 sister Crohn's disease  1 sister healthy  1 brother died Prostate cancer/AIDS...was diagnosed with prostate cancer at age 84.  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  Married  3 kids  Alcohol use-no. None since 2008  Drug use-prior cocaine none since 2008. Denies IVDU.  Nonsmoker Prior smoker none since 2008.   Review of Systems  All systems reviewed and negative except as per HPI.   Current Outpatient Prescriptions  Medication Sig Dispense Refill  . aspirin EC 81 MG tablet Take 1 tablet (81 mg total) by mouth daily.       . carvedilol (COREG) 6.25 MG tablet 1 and 1/2 tablets (total 9.375mg ) two times a day  90 tablet  3  . furosemide (LASIX) 40 MG tablet Take 1 tablet (40 mg total) by mouth daily.      Marland Kitchen lisinopril (PRINIVIL,ZESTRIL) 20 MG tablet Take 0.5 tablets (10 mg total) by mouth 2 (two) times daily.      Marland Kitchen omeprazole (PRILOSEC) 20 MG capsule TAKE ONE CAPSULE BY MOUTH EVERY DAY  30 capsule  0  . spironolactone (ALDACTONE) 25 MG tablet Take 1 tablet (25 mg total) by mouth daily.  30 tablet  3   No current facility-administered medications for this encounter.    Filed Vitals:   10/23/13 1131  BP: 132/74  Pulse: 85  Weight: 221 lb 12 oz (100.585 kg)  SpO2: 99%   General: NAD Neck: No JVD, no thyromegaly or thyroid nodule.  Lungs: Clear to auscultation bilaterally with normal respiratory effort. CV: Nondisplaced PMI.  Heart regular S1/S2, no S3/S4, no murmur.  No peripheral edema.  No carotid bruit.  Normal pedal pulses.  Abdomen: Soft, nontender, no hepatosplenomegaly, no distention.  Neurologic: Alert and oriented x 3.  Psych: Normal affect. Extremities: No clubbing or cyanosis.   Assessment/Plan: 1. Nonischemic cardiomyopathy: NYHA II-III symptoms. Not currently taking medications as prescribed, he was supposed to be on coreg 9.375 mg BID, however he is taking coreg 6.25 mg BID. Called pharmacy and looks like he has not been getting meds monthly  and may have been out of meds for a couple weeks or more. Will not titrate medications at this time because not sure what he is taking. Have asked him to come back tomorrow and please bring medications to establish what he is taking. Have provided him with an am and pm pill box. Lengthy conversation with patient about the importance of medication compliance. He also reports having some ETOH this weekend and discussed the need to abstain.  - Last ECHO 04/2013, EF 20-25%. Will not get repeat ECHO in December d/t do not feel patient has been compliant with  meds. Will see back tomorrow to titrate meds and then again in 3 weeks.  - Repeat ECHO 3 months after medication compliance. If EF remains less than 35% refer to EP for CRT-D, LBBB 2. CKD: Creatinine stable. Will continue to follow.  3. HTN: Controlled.      Ulla Potash B NP-C 10/23/2013

## 2013-10-23 NOTE — Patient Instructions (Signed)
Continue current medications. Stop by clinic tomorrow at 9:00 am and bring all medications.  No drinking or smoking.   F/U 3 weeks.  Do the following things EVERYDAY: 1) Weigh yourself in the morning before breakfast. Write it down and keep it in a log. 2) Take your medicines as prescribed 3) Eat low salt foods-Limit salt (sodium) to 2000 mg per day.  4) Stay as active as you can everyday 5) Limit all fluids for the day to less than 2 liters 6)

## 2013-10-24 ENCOUNTER — Encounter (HOSPITAL_COMMUNITY): Payer: Self-pay | Admitting: Anesthesiology

## 2013-10-24 ENCOUNTER — Telehealth (HOSPITAL_COMMUNITY): Payer: Self-pay | Admitting: *Deleted

## 2013-10-24 MED ORDER — CARVEDILOL 3.125 MG PO TABS: 3.1250 mg | ORAL_TABLET | Freq: Two times a day (BID) | ORAL | Status: DC

## 2013-10-24 NOTE — Telephone Encounter (Signed)
Per Ulla Potash, NP start Carvedilol 3.125 mg tabs along with 6.25 mg tabs to equal 9.375 mg Twice daily

## 2013-10-24 NOTE — Patient Instructions (Signed)
Start taking carvedilol 3.125 mg tablet twice a day with your carvedilol 6.25 mg tablet twice a day.   Call any issues.  Do the following things EVERYDAY: 1) Weigh yourself in the morning before breakfast. Write it down and keep it in a log. 2) Take your medicines as prescribed 3) Eat low salt foods-Limit salt (sodium) to 2000 mg per day.  4) Stay as active as you can everyday 5) Limit all fluids for the day to less than 2 liters 6)

## 2013-10-24 NOTE — Progress Notes (Signed)
Patient ID: Roger Crosby, male   DOB: Apr 10, 1961, 52 y.o.   MRN: [id number] Patient brought medications to clinic this am to verify what he was taking.  Current meds: Lisinopril 20 mg daily Carevedilol 6.25 mg BID Lasix 20 mg daily Spironolactone 25 mg daily.  Previous note thought he was on Carvedilol 9.375 mg BID and lasix 40 mg daily. He does not appear to have any fluid on board so will keep lasix 20 mg daily. Weight is up, but do not think it is fluid. Instructed if weight goes up more than 3 lbs in a day take an extra lasix 20 mg.  Will increase coreg to 9.375 mg BID and wrote on bottle. Instyructed to call if difficulty cutting 6.25 mg tablet and we could send 3.125 mg tablet to take along with 6.25 mg tablet.  Follow up 3 weeks medication titration.  Ulla Potash B NP-C 9:13 AM

## 2013-11-09 ENCOUNTER — Ambulatory Visit (HOSPITAL_COMMUNITY)
Admission: RE | Admit: 2013-11-09 | Discharge: 2013-11-09 | Disposition: A | Discharge status: Home / Self Care | Payer: Medicaid Other | Source: Ambulatory Visit | Attending: Internal Medicine | Admitting: Internal Medicine

## 2013-11-09 VITALS — BP 112/72 | HR 73 | Wt 219.2 lb

## 2013-11-09 DIAGNOSIS — I5022 Chronic systolic (congestive) heart failure: Secondary | ICD-10-CM

## 2013-11-09 DIAGNOSIS — I10 Essential (primary) hypertension: Secondary | ICD-10-CM

## 2013-11-09 DIAGNOSIS — I509 Heart failure, unspecified: Secondary | ICD-10-CM

## 2013-11-09 MED ORDER — CARVEDILOL 12.5 MG PO TABS: 12.5000 mg | ORAL_TABLET | Freq: Two times a day (BID) | ORAL | Status: DC

## 2013-11-09 NOTE — Progress Notes (Signed)
Patient ID: Roger Crosby, male   DOB: 03-24-1961, 52 y.o.   MRN: [id number]  Roger Crosby is 52 yo with history of LBBB and  NICM. EF 04/2013 had fallen to 20-25% with regional wall motion abnormalities. Dr. Shirlee Latch did a left heart cath in 07/2013 that showed no obstructive disease. NICM   He returns for follow up. Last visit carvedilol was increased to 9.375 mg twice a day. Says he has more energy but still with mild dyspnea. Denies PND/dizziness. + Orthopnea sleeps on 2 pillows. He has to take breaks walking around [facility] due to mild dyspnea. Weight at home 220 pounds. No swelling in legs. Taking all medications.     Labs (9/14): K 3.9, creatinine 1.6 Labs (10/14): K 4, creatinine 1.3 Labs (10/05/13): K+ 4.1, creatinine 1.5  PMH: 1. Hypertension  2. GERD  3. Low back pain  4. LBBB: Newly noted 2011  5. Nonischemic cardiomyopathy: Echo (5/11): EF 45-50%, mild hypokinesis of the apical anteroseptal wall, apical anterior wall, and true apex (LAD distribution), mild LAE.  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  Echo (6/14) with EF 20-25% with inferior/inferoseptal akinesis and hypokinesis elsewhere.  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  6. CKD   Family History:  Mother deceased CHF,DM,HTN  Father with heart trouble...died age 69.  1 sister Crohn's disease  1 sister healthy  1 brother died Prostate cancer/AIDS...was diagnosed with prostate cancer at age 47.  Father and mother both had MIs in their 10s   Social History:  Occupation:prior Air traffic controller; has been working for [employer] and [employer].  HS graduate.  Married  3 kids  Alcohol use-no. None since 2008  Drug use-prior cocaine none since 2008. Denies IVDU.  Nonsmoker Prior smoker none since 2008.   Review of Systems  All systems reviewed and negative except as per HPI.   Current Outpatient Prescriptions  Medication Sig Dispense Refill  . aspirin EC 81 MG tablet Take 1  tablet (81 mg total) by mouth daily.      . carvedilol (COREG) 3.125 MG tablet Take 1 tablet (3.125 mg total) by mouth 2 (two) times daily. Take along with Carvedilol 6.25 mg to equal 9.375 mg Twice daily  60 tablet  3  . carvedilol (COREG) 6.25 MG tablet Take 6.25 mg by mouth 2 (two) times daily with a meal.       . furosemide (LASIX) 20 MG tablet Take 20 mg by mouth daily.      Marland Kitchen lisinopril (PRINIVIL,ZESTRIL) 20 MG tablet Take 20 mg by mouth daily.      Marland Kitchen omeprazole (PRILOSEC) 20 MG capsule TAKE ONE CAPSULE BY MOUTH EVERY DAY  30 capsule  0  . spironolactone (ALDACTONE) 25 MG tablet Take 1 tablet (25 mg total) by mouth daily.  30 tablet  3   No current facility-administered medications for this encounter.    Filed Vitals:   11/09/13 1207  BP: 112/72  Pulse: 73  Weight: 219 lb 4 oz (99.451 kg)  SpO2: 99%   General: NAD Neck: No JVD, no thyromegaly or thyroid nodule.  Lungs: Clear to auscultation bilaterally with normal respiratory effort. CV: Nondisplaced PMI.  Heart regular S1/S2, no S3/S4, no murmur.  No peripheral edema.  No carotid bruit.  Normal pedal pulses.  Abdomen: Soft, nontender, no hepatosplenomegaly, no distention.  Neurologic: Alert and oriented x 3.  Psych: Normal affect. Extremities: No clubbing or cyanosis.   Assessment/Plan: 1. Chronic Systolic  Heart Failure. NICM.  ECHO 04/2013 EF 20-25%. NYHA II with functional improvement noted.  -Volume status stable. Continue lasix 20 mg daily and spironolactone 25 mg daily.  -Increase carvedilol to 12.5 mg twice a day.I have provided specific written instructions on how to take carvedilol. It will be important to make his medication regimen as easy as possible.  - Continue lisinopril 20 mg daily.  -Reinforced daily weights, low salt food choices, and limiting fluid intake to < 2 liters per day.  - Will need ECHO in the next couple months after HF meds optimized. If EF remains < 35% will need to refer to EP for CRT-D. Has LBBB.    2. CKD: Reviewed lab work from 10/05/13 renal function stable. Repeat BMET at next visit.    3. HTN: Improved and stable.  Continue lisinopril 20 mg daily, spironolactone 25 mg daily, and increase carvedilol as above to 12.5 mg twice a day.     Follow up in 3 -4 weeks  Shaquandra Galano NP-C 11/09/2013

## 2013-11-09 NOTE — Patient Instructions (Signed)
Stop taking carvedilol 3.125 mg   Take carvedilol 12.5 mg twice a day from your 6.25 mg bottle which 2 tablets in morning and 2 tablets in the evening  When you run out of the 6.25 mg tablet then go to [facility] and pick up your new prescription  When you pick up the new prescription it will be stronger then you will take carvedilol 12.5 mg twice a day which is 1 table twice a day   Do the following things EVERYDAY: 1) Weigh yourself in the morning before breakfast. Write it down and keep it in a log. 2) Take your medicines as prescribed 3) Eat low salt foods-Limit salt (sodium) to 2000 mg per day.  4) Stay as active as you can everyday 5) Limit all fluids for the day to less than 2 liters

## 2013-11-14 ENCOUNTER — Other Ambulatory Visit: Payer: Self-pay

## 2013-11-14 MED ORDER — OMEPRAZOLE 20 MG PO CPDR: DELAYED_RELEASE_CAPSULE | ORAL | Status: DC

## 2013-11-29 ENCOUNTER — Encounter (HOSPITAL_COMMUNITY): Payer: Medicaid Other

## 2013-12-07 ENCOUNTER — Encounter (HOSPITAL_COMMUNITY): Payer: Medicaid Other

## 2013-12-14 ENCOUNTER — Ambulatory Visit (HOSPITAL_COMMUNITY)
Admission: RE | Admit: 2013-12-14 | Discharge: 2013-12-14 | Disposition: A | Discharge status: Home / Self Care | Payer: Medicaid Other | Source: Ambulatory Visit | Attending: Internal Medicine | Admitting: Internal Medicine

## 2013-12-14 VITALS — BP 132/80 | HR 82 | Wt 223.2 lb

## 2013-12-14 DIAGNOSIS — Z79899 Other long term (current) drug therapy: Secondary | ICD-10-CM | POA: Insufficient documentation

## 2013-12-14 DIAGNOSIS — Z7982 Long term (current) use of aspirin: Secondary | ICD-10-CM | POA: Insufficient documentation

## 2013-12-14 DIAGNOSIS — N529 Male erectile dysfunction, unspecified: Secondary | ICD-10-CM | POA: Insufficient documentation

## 2013-12-14 DIAGNOSIS — I5022 Chronic systolic (congestive) heart failure: Secondary | ICD-10-CM | POA: Insufficient documentation

## 2013-12-14 DIAGNOSIS — I509 Heart failure, unspecified: Secondary | ICD-10-CM

## 2013-12-14 DIAGNOSIS — I129 Hypertensive chronic kidney disease with stage 1 through stage 4 chronic kidney disease, or unspecified chronic kidney disease: Secondary | ICD-10-CM | POA: Insufficient documentation

## 2013-12-14 DIAGNOSIS — I428 Other cardiomyopathies: Secondary | ICD-10-CM | POA: Insufficient documentation

## 2013-12-14 DIAGNOSIS — N189 Chronic kidney disease, unspecified: Secondary | ICD-10-CM | POA: Insufficient documentation

## 2013-12-14 DIAGNOSIS — I447 Left bundle-branch block, unspecified: Secondary | ICD-10-CM | POA: Insufficient documentation

## 2013-12-14 DIAGNOSIS — R0602 Shortness of breath: Secondary | ICD-10-CM | POA: Insufficient documentation

## 2013-12-14 DIAGNOSIS — I10 Essential (primary) hypertension: Secondary | ICD-10-CM

## 2013-12-14 DIAGNOSIS — K219 Gastro-esophageal reflux disease without esophagitis: Secondary | ICD-10-CM | POA: Insufficient documentation

## 2013-12-14 DIAGNOSIS — F528 Other sexual dysfunction not due to a substance or known physiological condition: Secondary | ICD-10-CM

## 2013-12-14 LAB — BASIC METABOLIC PANEL
BUN: 15 mg/dL (ref 6–23)
CALCIUM: 9.6 mg/dL (ref 8.4–10.5)
CHLORIDE: 106 meq/L (ref 96–112)
CO2: 23 meq/L (ref 19–32)
CREATININE: 1.33 mg/dL (ref 0.50–1.35)
GFR calc Af Amer: 70 mL/min — ABNORMAL LOW (ref >90)
GFR calc non Af Amer: 60 mL/min — ABNORMAL LOW (ref >90)
GLUCOSE: 97 mg/dL (ref 70–99)
Potassium: 4.4 mEq/L (ref 3.7–5.3)
Sodium: 144 mEq/L (ref 137–147)

## 2013-12-14 LAB — PRO B NATRIURETIC PEPTIDE: Pro B Natriuretic peptide (BNP): 445.3 pg/mL — ABNORMAL HIGH (ref 0–125)

## 2013-12-14 MED ORDER — CARVEDILOL 12.5 MG PO TABS: 12.5000 mg | ORAL_TABLET | Freq: Two times a day (BID) | ORAL | Status: DC

## 2013-12-14 MED ORDER — SPIRONOLACTONE 25 MG PO TABS: 25.0000 mg | ORAL_TABLET | Freq: Every day | ORAL | Status: DC

## 2013-12-14 MED ORDER — OMEPRAZOLE 20 MG PO CPDR: DELAYED_RELEASE_CAPSULE | ORAL | Status: DC

## 2013-12-14 MED ORDER — LISINOPRIL 20 MG PO TABS: 20.0000 mg | ORAL_TABLET | Freq: Two times a day (BID) | ORAL | Status: DC

## 2013-12-14 MED ORDER — FUROSEMIDE 20 MG PO TABS: 20.0000 mg | ORAL_TABLET | Freq: Every day | ORAL | Status: DC

## 2013-12-14 NOTE — Patient Instructions (Signed)
Increase your lisinopril to 20 mg (1 tablet) in the morning and 20 mg (1 tablet) in the evening.  Take an extra lasix today and tomorrow.  Check labs at [facility], next Friday anytime  Follow up 4-6 weeks with ECHO  Do the following things EVERYDAY: 1) Weigh yourself in the morning before breakfast. Write it down and keep it in a log. 2) Take your medicines as prescribed 3) Eat low salt foods-Limit salt (sodium) to 2000 mg per day.  4) Stay as active as you can everyday 5) Limit all fluids for the day to less than 2 liters 6)

## 2013-12-14 NOTE — Progress Notes (Signed)
Patient ID: Roger Crosby, male   DOB: 11-21-1961, 53 y.o.   MRN: [id number]  Roger Crosby is 53 yo with history of LBBB and  NICM. EF 04/2013 had fallen to 20-25% with regional wall motion abnormalities. Dr. Aundra Dubin did a left heart cath in 07/2013 that showed no obstructive disease. NICM   Follow up: Doing ok. Notices since last visit he has noticed more SOB. Mild SOB with taking the trash out and bending over. Denies dizziness, CP, edema, PND or orthopnea. Still has to take breaks if walking around [place] or going upstairs. Weight at home 220-223 lbs. Taking medications as prescribed. Having issues with erectile dysfunction.   Labs (9/14): K 3.9, creatinine 1.6 Labs (10/14): K 4, creatinine 1.3 Labs (10/05/13): K+ 4.1, creatinine 1.5  PMH: 1. Hypertension  2. GERD  3. Low back pain  4. LBBB: Newly noted 2011  5. Nonischemic cardiomyopathy: Echo (5/11): EF 45-50%, mild hypokinesis of the apical anteroseptal wall, apical anterior wall, and true apex (LAD distribution), mild LAE.  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  Echo (6/14) with EF 20-25% with inferior/inferoseptal akinesis and hypokinesis elsewhere.  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  6. CKD   Family History:  Mother deceased CHF,DM,HTN  Father with heart trouble...died age 64.  1 sister Crohn's disease  1 sister healthy  1 brother died Prostate cancer/AIDS...was diagnosed with prostate cancer at age 63.  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 HS graduate.  Married  3 kids  Alcohol use-no. None since 2008  Drug use-prior cocaine none since 2008. Denies IVDU.  Nonsmoker Prior smoker none since 2008.   Review of Systems  All systems reviewed and negative except as per HPI.   Current Outpatient Prescriptions  Medication Sig Dispense Refill  .  aspirin EC 81 MG tablet Take 1 tablet (81 mg total) by mouth daily.      . carvedilol (COREG) 12.5 MG tablet Take 1 tablet (12.5 mg total) by mouth 2 (two) times daily with a meal.  60 tablet  6  . furosemide (LASIX) 20 MG tablet Take 20 mg by mouth daily.      Marland Kitchen lisinopril (PRINIVIL,ZESTRIL) 20 MG tablet Take 20 mg by mouth daily.      Marland Kitchen omeprazole (PRILOSEC) 20 MG capsule TAKE ONE CAPSULE BY MOUTH EVERY DAY  30 capsule  3  . spironolactone (ALDACTONE) 25 MG tablet Take 1 tablet (25 mg total) by mouth daily.  30 tablet  3   No current facility-administered medications for this encounter.    Filed Vitals:   12/14/13 1359  BP: 132/80  Pulse: 82  Weight: 223 lb 4 oz (101.266 kg)  SpO2: 99%   General: NAD Neck: JVD 7-8, no thyromegaly or thyroid nodule.  Lungs: Clear to auscultation bilaterally with normal respiratory effort. CV: Nondisplaced PMI.  Heart regular S1/S2, no S3/S4, no murmur.  No peripheral edema.  No carotid bruit.  Normal pedal pulses.  Abdomen: Soft, nontender, no hepatosplenomegaly, no distention.  Neurologic: Alert and oriented x 3.  Psych: Normal affect. Extremities: No clubbing or cyanosis.   Assessment/Plan:  1. Chronic Systolic Heart Failure. NICM. ECHO 04/2013 EF 20-25%. - NYHA III symptoms and volume status appears mildly elevated. Will increase lasix for 2 days to 40 mg and then back to 20 mg daily. Check  BMET and pro-BNP today. - Continue coreg at 12.5 mg BID, will not titrate with complaints of erectile dysfunction. - Will increase lisinopril to 20 mg BID and check BMET next week.  - Continue spironolactone 25 mg daily.  - Reinforced the need and importance of daily weights, a low sodium diet, and fluid restriction (less than 2 L a day). Instructed to call the HF clinic if weight increases more than 3 lbs overnight or 5 lbs in a week.   - Will need ECHO in 6-8 weeks. If EF remains < 35% will need to refer to EP for CRT-D. Has LBBB.  2. CKD:  - BMET today  and then 7-10 days with up-titration of lisinopril.  3. HTN:  - Stable on current regimen. As above will increase lisinopril to 20 mg BID for LV dysfunction. 4. Erectile Dysfunction: - Patient reports he is having issues and would like to stop medications. Discussed with him the importance of the HF medications in hoping to improve EF. Will provide him with two prescriptions one for 10 mg cialis # 5 and 100 mg Viagra # 5 to see which is cheaper with Medicaid.  F/U 6-8 weeks with ECHO. Junie Bame B NP-C 12/14/2013

## 2013-12-21 ENCOUNTER — Emergency Department (HOSPITAL_COMMUNITY)
Admission: EM | Admit: 2013-12-21 | Discharge: 2013-12-21 | Disposition: A | Discharge status: Home / Self Care | Payer: Medicaid Other | Attending: Emergency Medicine | Admitting: Emergency Medicine

## 2013-12-21 ENCOUNTER — Encounter (HOSPITAL_COMMUNITY): Payer: Self-pay | Admitting: Emergency Medicine

## 2013-12-21 ENCOUNTER — Emergency Department (HOSPITAL_COMMUNITY): Payer: Medicaid Other

## 2013-12-21 DIAGNOSIS — Z95818 Presence of other cardiac implants and grafts: Secondary | ICD-10-CM | POA: Insufficient documentation

## 2013-12-21 DIAGNOSIS — Z7982 Long term (current) use of aspirin: Secondary | ICD-10-CM | POA: Insufficient documentation

## 2013-12-21 DIAGNOSIS — F17200 Nicotine dependence, unspecified, uncomplicated: Secondary | ICD-10-CM | POA: Insufficient documentation

## 2013-12-21 DIAGNOSIS — R197 Diarrhea, unspecified: Secondary | ICD-10-CM | POA: Insufficient documentation

## 2013-12-21 DIAGNOSIS — I2510 Atherosclerotic heart disease of native coronary artery without angina pectoris: Secondary | ICD-10-CM | POA: Insufficient documentation

## 2013-12-21 DIAGNOSIS — Z79899 Other long term (current) drug therapy: Secondary | ICD-10-CM | POA: Insufficient documentation

## 2013-12-21 DIAGNOSIS — R0602 Shortness of breath: Secondary | ICD-10-CM | POA: Insufficient documentation

## 2013-12-21 DIAGNOSIS — N189 Chronic kidney disease, unspecified: Secondary | ICD-10-CM | POA: Insufficient documentation

## 2013-12-21 DIAGNOSIS — K219 Gastro-esophageal reflux disease without esophagitis: Secondary | ICD-10-CM | POA: Insufficient documentation

## 2013-12-21 DIAGNOSIS — R1110 Vomiting, unspecified: Secondary | ICD-10-CM | POA: Insufficient documentation

## 2013-12-21 DIAGNOSIS — R079 Chest pain, unspecified: Secondary | ICD-10-CM | POA: Insufficient documentation

## 2013-12-21 DIAGNOSIS — I129 Hypertensive chronic kidney disease with stage 1 through stage 4 chronic kidney disease, or unspecified chronic kidney disease: Secondary | ICD-10-CM | POA: Insufficient documentation

## 2013-12-21 DIAGNOSIS — I5020 Unspecified systolic (congestive) heart failure: Secondary | ICD-10-CM | POA: Insufficient documentation

## 2013-12-21 LAB — CBC
HEMATOCRIT: 49.5 % (ref 39.0–52.0)
HEMOGLOBIN: 17.1 g/dL — AB (ref 13.0–17.0)
MCH: 27.5 pg (ref 26.0–34.0)
MCHC: 34.5 g/dL (ref 30.0–36.0)
MCV: 79.7 fL (ref 78.0–100.0)
Platelets: 299 10*3/uL (ref 150–400)
RBC: 6.21 MIL/uL — ABNORMAL HIGH (ref 4.22–5.81)
RDW: 14.5 % (ref 11.5–15.5)
WBC: 17 10*3/uL — ABNORMAL HIGH (ref 4.0–10.5)

## 2013-12-21 LAB — URINALYSIS, ROUTINE W REFLEX MICROSCOPIC
Glucose, UA: NEGATIVE mg/dL
Ketones, ur: 15 mg/dL — AB
Leukocytes, UA: NEGATIVE
Nitrite: NEGATIVE
PROTEIN: 30 mg/dL — AB
Specific Gravity, Urine: 1.036 — ABNORMAL HIGH (ref 1.005–1.030)
Urobilinogen, UA: 0.2 mg/dL (ref 0.0–1.0)
pH: 5.5 (ref 5.0–8.0)

## 2013-12-21 LAB — BASIC METABOLIC PANEL
BUN: 18 mg/dL (ref 6–23)
CO2: 21 mEq/L (ref 19–32)
CREATININE: 1.55 mg/dL — AB (ref 0.50–1.35)
Calcium: 9.9 mg/dL (ref 8.4–10.5)
Chloride: 102 mEq/L (ref 96–112)
GFR, EST AFRICAN AMERICAN: 58 mL/min — AB (ref >90)
GFR, EST NON AFRICAN AMERICAN: 50 mL/min — AB (ref >90)
Glucose, Bld: 115 mg/dL — ABNORMAL HIGH (ref 70–99)
Potassium: 4.4 mEq/L (ref 3.7–5.3)
Sodium: 141 mEq/L (ref 137–147)

## 2013-12-21 LAB — URINE MICROSCOPIC-ADD ON

## 2013-12-21 LAB — POCT I-STAT TROPONIN I: Troponin i, poc: 0 ng/mL (ref 0.00–0.08)

## 2013-12-21 LAB — PRO B NATRIURETIC PEPTIDE: Pro B Natriuretic peptide (BNP): 406.2 pg/mL — ABNORMAL HIGH (ref 0–125)

## 2013-12-21 MED ORDER — HYDROCODONE-ACETAMINOPHEN 5-325 MG PO TABS: 2.0000 | ORAL_TABLET | ORAL | Status: DC | PRN

## 2013-12-21 MED ORDER — ONDANSETRON HCL 4 MG/2ML IJ SOLN
4.0000 mg | Freq: Once | INTRAMUSCULAR | Status: AC
  Administered 2013-12-21: 4 mg via INTRAVENOUS
  Filled 2013-12-21: qty 2

## 2013-12-21 MED ORDER — ONDANSETRON HCL 8 MG PO TABS: 8.0000 mg | ORAL_TABLET | Freq: Three times a day (TID) | ORAL | Status: DC | PRN

## 2013-12-21 MED ORDER — SODIUM CHLORIDE 0.9 % IV BOLUS (SEPSIS)
500.0000 mL | Freq: Once | INTRAVENOUS | Status: AC
  Administered 2013-12-21: 500 mL via INTRAVENOUS

## 2013-12-21 MED ORDER — HYDROMORPHONE HCL PF 1 MG/ML IJ SOLN
0.5000 mg | Freq: Once | INTRAMUSCULAR | Status: AC
  Administered 2013-12-21: 0.5 mg via INTRAVENOUS
  Filled 2013-12-21: qty 1

## 2013-12-21 MED ORDER — SODIUM CHLORIDE 0.9 % IV SOLN: INTRAVENOUS | Status: DC

## 2013-12-21 MED ORDER — HYDROMORPHONE HCL PF 1 MG/ML IJ SOLN
1.0000 mg | Freq: Once | INTRAMUSCULAR | Status: AC
  Administered 2013-12-21: 1 mg via INTRAVENOUS
  Filled 2013-12-21: qty 1

## 2013-12-21 NOTE — ED Provider Notes (Signed)
CSN: [id number]     Arrival date & time 12/21/13  1554 History   First MD Initiated Contact with Patient 12/21/13 1627     Chief Complaint  Patient presents with  . Chest Pain  . Shortness of Breath   (Consider location/radiation/quality/duration/timing/severity/associated sxs/prior Treatment) Patient is a 53 y.o. male presenting with chest pain and shortness of breath. The history is provided by the patient.  Chest Pain Associated symptoms: shortness of breath   Shortness of Breath Associated symptoms: chest pain    He complains of onset of diarrhea, at 10 AM this morning, followed by vomiting, followed by chest pain. No blood in diarrhea, or emesis. He denies fever, chills, weakness, dizziness, cough, or back pain. He follows with his cardiologist regularly for nonischemic cardiomyopathy, coronary artery disease, and systolic, heart failure. No other recent illnesses. No known sick contacts.   Past Medical History  Diagnosis Date  . HTN (hypertension)   . GERD (gastroesophageal reflux disease)   . LBP (low back pain)   . LBBB (left bundle branch block)     dx 2011  . Hx of echocardiogram     echo 5/11: EF 45-50%, mild HK of Ap AS wall, ap Ant wall and true apex (LAD distribution), mild LAE  . NICM (nonischemic cardiomyopathy)   . CAD (coronary artery disease)     nonobstructive by [facility] 6/11: oOM1 40%, mild luminal irregs elsewhere  . CKD (chronic kidney disease)   . Systolic heart failure    Past Surgical History  Procedure Laterality Date  . Cardiac catheterization Left 9/14    with angiogram   Family History  Problem Relation Age of Onset  . Heart attack Mother     in 69's  . Heart disease Mother     ischemic  . Heart attack Father     in 8's  . Heart disease Father     ischemic  . Congestive Heart Failure Mother   . Diabetes Mother   . Hypertension Mother   . Heart Problems Father   . Crohn's disease Sister   . Prostate cancer Brother   . HIV/AIDS Brother     History  Substance Use Topics  . Smoking status: Current Some Day Smoker  . Smokeless tobacco: Not on file  . Alcohol Use: No    Review of Systems  Respiratory: Positive for shortness of breath.   Cardiovascular: Positive for chest pain.  All other systems reviewed and are negative.    Allergies  Review of patient's allergies indicates no known allergies.  Home Medications   Current Outpatient Rx  Name  Route  Sig  Dispense  Refill  . aspirin EC 81 MG tablet   Oral   Take 1 tablet (81 mg total) by mouth daily.         . carvedilol (COREG) 12.5 MG tablet   Oral   Take 1 tablet (12.5 mg total) by mouth 2 (two) times daily with a meal.   60 tablet   6   . furosemide (LASIX) 20 MG tablet   Oral   Take 1 tablet (20 mg total) by mouth daily.   30 tablet   3   . lisinopril (PRINIVIL,ZESTRIL) 20 MG tablet   Oral   Take 1 tablet (20 mg total) by mouth 2 (two) times daily.   60 tablet   3   . omeprazole (PRILOSEC) 20 MG capsule      TAKE ONE CAPSULE BY MOUTH EVERY DAY         .  spironolactone (ALDACTONE) 25 MG tablet   Oral   Take 1 tablet (25 mg total) by mouth daily.   30 tablet   3    BP 119/66  Pulse 110  Temp(Src) 98 F (36.7 C) (Oral)  Resp 21  SpO2 99% Physical Exam  Nursing note and vitals reviewed. Constitutional: He is oriented to person, place, and time. He appears well-developed and well-nourished. He appears distressed (He is uncomfortable).  HENT:  Head: Normocephalic and atraumatic.  Right Ear: External ear normal.  Left Ear: External ear normal.  Eyes: Conjunctivae and EOM are normal. Pupils are equal, round, and reactive to light.  Neck: Normal range of motion and phonation normal. Neck supple.  Cardiovascular: Normal rate, regular rhythm, normal heart sounds and intact distal pulses.   Pulmonary/Chest: Effort normal and breath sounds normal. He exhibits no bony tenderness.  Abdominal: Soft. Normal appearance and bowel sounds are  normal. He exhibits no distension. Tenderness: Epigastric, mild. There is no rebound and no guarding.  Musculoskeletal: Normal range of motion.  Neurological: He is alert and oriented to person, place, and time. No cranial nerve deficit or sensory deficit. He exhibits normal muscle tone. Coordination normal.  Skin: Skin is warm, dry and intact.  Psychiatric: He has a normal mood and affect. His behavior is normal. Judgment and thought content normal.    ED Course  Procedures (including critical care time) Medications  0.9 %  sodium chloride infusion ( Intravenous Rate/Dose Change 12/21/13 1833)  HYDROmorphone (DILAUDID) injection 0.5 mg (not administered)  ondansetron (ZOFRAN) injection 4 mg (not administered)  sodium chloride 0.9 % bolus 500 mL (0 mLs Intravenous Stopped 12/21/13 1834)  HYDROmorphone (DILAUDID) injection 1 mg (1 mg Intravenous Given 12/21/13 1723)  ondansetron (ZOFRAN) injection 4 mg (4 mg Intravenous Given 12/21/13 1723)    Patient Vitals for the past 24 hrs:  BP Temp Temp src Pulse Resp SpO2  12/21/13 2000 119/66 mmHg - - 110 21 99 %  12/21/13 1930 124/76 mmHg - - 110 22 98 %  12/21/13 1700 119/71 mmHg - - 85 27 98 %  12/21/13 1610 120/75 mmHg 98 F (36.7 C) Oral - 18 98 %    8:21 PM Reevaluation with update and discussion. After initial assessment and treatment, an updated evaluation reveals he feels better, and is tolerating fluids and crackers orally. He would like some more pain medicine prior to leaving. [place] Review Labs Reviewed  CBC - Abnormal; Notable for the following:    WBC 17.0 (*)    RBC 6.21 (*)    Hemoglobin 17.1 (*)    All other components within normal limits  BASIC METABOLIC PANEL - Abnormal; Notable for the following:    Glucose, Bld 115 (*)    Creatinine, Ser 1.55 (*)    GFR calc non Af Amer 50 (*)    GFR calc Af Amer 58 (*)    All other components within normal limits  PRO B NATRIURETIC PEPTIDE - Abnormal; Notable  for the following:    Pro B Natriuretic peptide (BNP) 406.2 (*)    All other components within normal limits  URINALYSIS, ROUTINE W REFLEX MICROSCOPIC - Abnormal; Notable for the following:    APPearance HAZY (*)    Specific Gravity, Urine 1.036 (*)    Hgb urine dipstick SMALL (*)    Bilirubin Urine SMALL (*)    Ketones, ur 15 (*)    Protein, ur 30 (*)  All other components within normal limits  URINE MICROSCOPIC-ADD ON - Abnormal; Notable for the following:    Bacteria, UA FEW (*)    All other components within normal limits  POCT I-STAT TROPONIN I   Imaging Review Dg Chest 2 View  12/21/2013   CLINICAL DATA:  Chest pain  EXAM: CHEST  2 VIEW  COMPARISON:  05/22/2013  FINDINGS: Cardiomediastinal silhouette is stable. No acute infiltrate or pleural effusion. No pulmonary edema. Bony thorax is unremarkable.  IMPRESSION: No active cardiopulmonary disease.   Electronically Signed   By: Lahoma Crocker M.D.   On: 12/21/2013 16:26    EKG Interpretation    Date/Time:  Thursday December 21 2013 16:04:20 EST Ventricular Rate:  97 PR Interval:  138 QRS Duration: 136 QT Interval:  380 QTC Calculation: 482 R Axis:   16 Text Interpretation:  Normal sinus rhythm Left bundle branch block Abnormal ECG No significant change was found Confirmed by Marah Park  MD, Anamika Kueker ([id number]) on 12/21/2013 4:29:32 PM          EKG unchanged since 08/22/13  MDM   1. Vomiting   2. Diarrhea   3. Nonspecific chest pain      Evaluation is consistent with nonspecific vomiting, and diarrhea, and likely secondary  GERD causing his chest discomfort  Nursing Notes Reviewed/ Care Coordinated Applicable Imaging Reviewed Interpretation of Laboratory Data incorporated into ED treatment  The patient appears reasonably screened and/or stabilized for discharge and I doubt any other medical condition or other [facility] requiring further screening, evaluation, or treatment in the ED at this time prior to discharge.  Plan: Home  Medications- Norco, Zofran; Home Treatments- gradually advance diet; return here if the recommended treatment, does not improve the symptoms; Recommended follow up- PCP prn  Richarda Blade, MD 12/21/13 2044

## 2013-12-21 NOTE — ED Notes (Signed)
Pt reports onset 1300 of abd pain and n/v/d. Reports the last time he vomited, he had onset of pain across his chest and sob, hx of chf, denies any swelling to extremities. ekg done at triage, airway is intact.

## 2013-12-21 NOTE — Discharge Instructions (Signed)
Start with a clear liquid diet and gradually advance to bland foods. See, your doctor if not better in 3 days. Return here, if needed, for problems.    Diarrhea Diarrhea is watery poop (stool). It can make you feel weak, tired, thirsty, or give you a dry mouth (signs of dehydration). Watery poop is a sign of another problem, most often an infection. It often lasts 2 3 days. It can last longer if it is a sign of something serious. Take care of yourself as told by your doctor. HOME CARE   Drink 1 cup (8 ounces) of fluid each time you have watery poop.  Do not drink the following fluids:  Those that contain simple sugars (fructose, glucose, galactose, lactose, sucrose, maltose).  Sports drinks.  Fruit juices.  Whole milk products.  Sodas.  Drinks with caffeine (coffee, tea, soda) or alcohol.  Oral rehydration solution may be used if the doctor says it is okay. You may make your own solution. Follow this recipe:    teaspoon table salt.   teaspoon baking soda.   teaspoon salt substitute containing potassium chloride.  1 tablespoons sugar.  1 liter (34 ounces) of water.  Avoid the following foods:  High fiber foods, such as raw fruits and vegetables.  Nuts, seeds, and whole grain breads and cereals.   Those that are sweetened with sugar alcohols (xylitol, sorbitol, mannitol).  Try eating the following foods:  Starchy foods, such as rice, toast, pasta, low-sugar cereal, oatmeal, baked potatoes, crackers, and bagels.  Bananas.  Applesauce.  Eat probiotic-rich foods, such as yogurt and milk products that are fermented.  Wash your hands well after each time you have watery poop.  Only take medicine as told by your doctor.  Take a warm bath to help lessen burning or pain from having watery poop. GET HELP RIGHT AWAY IF:   You cannot drink fluids without throwing up (vomiting).  You keep throwing up.  You have blood in your poop, or your poop looks black and  tarry.  You do not pee (urinate) in 6 8 hours, or there is only a small amount of very dark pee.  You have belly (abdominal) pain that gets worse or stays in the same spot (localizes).  You are weak, dizzy, confused, or lightheaded.  You have a very bad headache.  Your watery poop gets worse or does not get better.  You have a fever or lasting symptoms for more than 2 3 days.  You have a fever and your symptoms suddenly get worse. MAKE SURE YOU:   Understand these instructions.  Will watch your condition.  Will get help right away if you are not doing well or get worse. Document Released: 05/04/2008 Document Revised: 08/10/2012 Document Reviewed: 07/24/2012 [facility] Patient Information 2014 [place], [place].  Nausea and Vomiting Nausea is a sick feeling that often comes before throwing up (vomiting). Vomiting is a reflex where stomach contents come out of your mouth. Vomiting can cause severe loss of body fluids (dehydration). Children and elderly adults can become dehydrated quickly, especially if they also have diarrhea. Nausea and vomiting are symptoms of a condition or disease. It is important to find the cause of your symptoms. CAUSES   Direct irritation of the stomach lining. This irritation can result from increased acid production (gastroesophageal reflux disease), infection, food poisoning, taking certain medicines (such as nonsteroidal anti-inflammatory drugs), alcohol use, or tobacco use.  Signals from the brain.These signals could be caused by a headache, heat  exposure, an inner ear disturbance, increased pressure in the brain from injury, infection, a tumor, or a concussion, pain, emotional stimulus, or metabolic problems.  An obstruction in the gastrointestinal tract (bowel obstruction).  Illnesses such as diabetes, hepatitis, gallbladder problems, appendicitis, kidney problems, cancer, sepsis, atypical symptoms of a heart attack, or eating disorders.  Medical  treatments such as chemotherapy and radiation.  Receiving medicine that makes you sleep (general anesthetic) during surgery. DIAGNOSIS Your caregiver may ask for tests to be done if the problems do not improve after a few days. Tests may also be done if symptoms are severe or if the reason for the nausea and vomiting is not clear. Tests may include:  Urine tests.  Blood tests.  Stool tests.  Cultures (to look for evidence of infection).  X-rays or other imaging studies. Test results can help your caregiver make decisions about treatment or the need for additional tests. TREATMENT You need to stay well hydrated. Drink frequently but in small amounts.You may wish to drink water, sports drinks, clear broth, or eat frozen ice pops or gelatin dessert to help stay hydrated.When you eat, eating slowly may help prevent nausea.There are also some antinausea medicines that may help prevent nausea. HOME CARE INSTRUCTIONS   Take all medicine as directed by your caregiver.  If you do not have an appetite, do not force yourself to eat. However, you must continue to drink fluids.  If you have an appetite, eat a normal diet unless your caregiver tells you differently.  Eat a variety of complex carbohydrates (rice, wheat, potatoes, bread), lean meats, yogurt, fruits, and vegetables.  Avoid high-fat foods because they are more difficult to digest.  Drink enough water and fluids to keep your urine clear or pale yellow.  If you are dehydrated, ask your caregiver for specific rehydration instructions. Signs of dehydration may include:  Severe thirst.  Dry lips and mouth.  Dizziness.  Dark urine.  Decreasing urine frequency and amount.  Confusion.  Rapid breathing or pulse. SEEK IMMEDIATE MEDICAL CARE IF:   You have blood or brown flecks (like coffee grounds) in your vomit.  You have black or bloody stools.  You have a severe headache or stiff neck.  You are confused.  You have  severe abdominal pain.  You have chest pain or trouble breathing.  You do not urinate at least once every 8 hours.  You develop cold or clammy skin.  You continue to vomit for longer than 24 to 48 hours.  You have a fever. MAKE SURE YOU:   Understand these instructions.  Will watch your condition.  Will get help right away if you are not doing well or get worse. Document Released: 11/16/2005 Document Revised: 02/08/2012 Document Reviewed: 04/15/2011 [facility] Patient Information 2014 [place], [place].  Chest Pain (Nonspecific) It is often hard to give a specific diagnosis for the cause of chest pain. There is always a chance that your pain could be related to something serious, such as a heart attack or a blood clot in the lungs. You need to follow up with your caregiver for further evaluation. CAUSES   Heartburn.  Pneumonia or bronchitis.  Anxiety or stress.  Inflammation around your heart (pericarditis) or lung (pleuritis or pleurisy).  A blood clot in the lung.  A collapsed lung (pneumothorax). It can develop suddenly on its own (spontaneous pneumothorax) or from injury (trauma) to the chest.  Shingles infection (herpes zoster virus). The chest wall is composed of bones, muscles, and cartilage.  Any of these can be the source of the pain.  The bones can be bruised by injury.  The muscles or cartilage can be strained by coughing or overwork.  The cartilage can be affected by inflammation and become sore (costochondritis). DIAGNOSIS  Lab tests or other studies, such as X-rays, electrocardiography, stress testing, or cardiac imaging, may be needed to find the cause of your pain.  TREATMENT   Treatment depends on what may be causing your chest pain. Treatment may include:  Acid blockers for heartburn.  Anti-inflammatory medicine.  Pain medicine for inflammatory conditions.  Antibiotics if an infection is present.  You may be advised to change lifestyle habits.  This includes stopping smoking and avoiding alcohol, caffeine, and chocolate.  You may be advised to keep your head raised (elevated) when sleeping. This reduces the chance of acid going backward from your stomach into your esophagus.  Most of the time, nonspecific chest pain will improve within 2 to 3 days with rest and mild pain medicine. HOME CARE INSTRUCTIONS   If antibiotics were prescribed, take your antibiotics as directed. Finish them even if you start to feel better.  For the next few days, avoid physical activities that bring on chest pain. Continue physical activities as directed.  Do not smoke.  Avoid drinking alcohol.  Only take over-the-counter or prescription medicine for pain, discomfort, or fever as directed by your caregiver.  Follow your caregiver's suggestions for further testing if your chest pain does not go away.  Keep any follow-up appointments you made. If you do not go to an appointment, you could develop lasting (chronic) problems with pain. If there is any problem keeping an appointment, you must call to reschedule. SEEK MEDICAL CARE IF:   You think you are having problems from the medicine you are taking. Read your medicine instructions carefully.  Your chest pain does not go away, even after treatment.  You develop a rash with blisters on your chest. SEEK IMMEDIATE MEDICAL CARE IF:   You have increased chest pain or pain that spreads to your arm, neck, jaw, back, or abdomen.  You develop shortness of breath, an increasing cough, or you are coughing up blood.  You have severe back or abdominal pain, feel nauseous, or vomit.  You develop severe weakness, fainting, or chills.  You have a fever. THIS IS AN EMERGENCY. Do not wait to see if the pain will go away. Get medical help at once. Call your local emergency services (911 in [place] Do not drive yourself to the hospital. MAKE SURE YOU:   Understand these instructions.  Will watch your  condition.  Will get help right away if you are not doing well or get worse. Document Released: 08/26/2005 Document Revised: 02/08/2012 Document Reviewed: 06/21/2008 [facility] Patient Information 2014 [place].

## 2013-12-22 ENCOUNTER — Other Ambulatory Visit: Payer: Medicaid Other

## 2014-03-15 ENCOUNTER — Encounter (HOSPITAL_COMMUNITY): Payer: Self-pay | Admitting: Cardiology

## 2014-04-16 ENCOUNTER — Emergency Department (HOSPITAL_COMMUNITY)
Admission: EM | Admit: 2014-04-16 | Discharge: 2014-04-16 | Disposition: A | Discharge status: Home / Self Care | Payer: Medicaid Other | Attending: Emergency Medicine | Admitting: Emergency Medicine

## 2014-04-16 ENCOUNTER — Encounter (HOSPITAL_COMMUNITY): Payer: Self-pay | Admitting: Emergency Medicine

## 2014-04-16 DIAGNOSIS — L237 Allergic contact dermatitis due to plants, except food: Secondary | ICD-10-CM

## 2014-04-16 DIAGNOSIS — Z79899 Other long term (current) drug therapy: Secondary | ICD-10-CM | POA: Insufficient documentation

## 2014-04-16 DIAGNOSIS — L255 Unspecified contact dermatitis due to plants, except food: Secondary | ICD-10-CM | POA: Insufficient documentation

## 2014-04-16 DIAGNOSIS — Z7982 Long term (current) use of aspirin: Secondary | ICD-10-CM | POA: Insufficient documentation

## 2014-04-16 DIAGNOSIS — Z9889 Other specified postprocedural states: Secondary | ICD-10-CM | POA: Insufficient documentation

## 2014-04-16 DIAGNOSIS — N189 Chronic kidney disease, unspecified: Secondary | ICD-10-CM | POA: Insufficient documentation

## 2014-04-16 DIAGNOSIS — I129 Hypertensive chronic kidney disease with stage 1 through stage 4 chronic kidney disease, or unspecified chronic kidney disease: Secondary | ICD-10-CM | POA: Insufficient documentation

## 2014-04-16 DIAGNOSIS — K219 Gastro-esophageal reflux disease without esophagitis: Secondary | ICD-10-CM | POA: Insufficient documentation

## 2014-04-16 DIAGNOSIS — I2510 Atherosclerotic heart disease of native coronary artery without angina pectoris: Secondary | ICD-10-CM | POA: Insufficient documentation

## 2014-04-16 DIAGNOSIS — I5020 Unspecified systolic (congestive) heart failure: Secondary | ICD-10-CM | POA: Insufficient documentation

## 2014-04-16 DIAGNOSIS — F17200 Nicotine dependence, unspecified, uncomplicated: Secondary | ICD-10-CM | POA: Insufficient documentation

## 2014-04-16 MED ORDER — PREDNISONE 20 MG PO TABS: ORAL_TABLET | ORAL | Status: DC

## 2014-04-16 NOTE — Discharge Instructions (Signed)
Please call your doctor for a followup appointment within 24-48 hours. When you talk to your doctor please let them know that you were seen in the emergency department and have them acquire all of your records so that they can discuss the findings with you and formulate a treatment plan to fully care for your new and ongoing problems. Please call and set-up an appointment with dermatology - skin physician Please take medications as prescribed - please take on a full stomach Please continue to monitor symptoms closely and if symptoms are to worsen or change (fever greater than 101, chills, sweating, nausea, vomiting, diarrhea, chest pain, shortness of breath, difficulty breathing, numbness, tingling, throat closing sensation, dizziness, worsening or changes to the rash) please report back to the ED immediately    Poison [facility] ivy is a rash caused by touching the leaves of the poison ivy plant. The rash often shows up 48 hours later. You might just have bumps, redness, and itching. Sometimes, blisters appear and break open. Your eyes may get puffy (swollen). Poison ivy often heals in 2 to 3 weeks without treatment. HOME CARE  If you touch poison ivy:  Wash your skin with soap and water right away. Wash under your fingernails. Do not rub the skin very hard.  Wash any clothes you were wearing.  Avoid poison ivy in the future. Poison ivy has 3 leaves on a stem.  Use medicine to help with itching as told by your doctor. Do not drive when you take this medicine.  Keep open sores dry, clean, and covered with a bandage and medicated cream, if needed.  Ask your doctor about medicine for children. GET HELP RIGHT AWAY IF:  You have open sores.  Redness spreads beyond the area of the rash.  There is yellowish white fluid (pus) coming from the rash.  Pain gets worse.  You have a temperature by mouth above 102 F (38.9 C), not controlled by medicine. MAKE SURE YOU:  Understand these  instructions.  Will watch your condition.  Will get help right away if you are not doing well or get worse. Document Released: 12/19/2010 Document Revised: 02/08/2012 Document Reviewed: 12/19/2010 [facility] Patient Information 2014 [place], [place].   Emergency Department Resource Guide 1) Find a Doctor and Pay Out of Pocket Although you won't have to find out who is covered by your insurance plan, it is a good idea to ask around and get recommendations. You will then need to call the office and see if the doctor you have chosen will accept you as a new patient and what types of options they offer for patients who are self-pay. Some doctors offer discounts or will set up payment plans for their patients who do not have insurance, but you will need to ask so you aren't surprised when you get to your appointment.  2) Contact Your Local Health Department Not all health departments have doctors that can see patients for sick visits, but many do, so it is worth a call to see if yours does. If you don't know where your local health department is, you can check in your phone book. The CDC also has a tool to help you locate your state's health department, and many state websites also have listings of all of their local health departments.  3) Find a Walk-in Clinic If your illness is not likely to be very severe or complicated, you may want to try a walk in clinic. These are popping up all over  the country in pharmacies, drugstores, and shopping centers. They're usually staffed by nurse practitioners or physician assistants that have been trained to treat common illnesses and complaints. They're usually fairly quick and inexpensive. However, if you have serious medical issues or chronic medical problems, these are probably not your best option.  No Primary Care Doctor: - Call Health Connect at  [phone] - they can help you locate a primary care doctor that  accepts your insurance, provides certain services,  etc. - Physician Referral Service- [phone]  Chronic Pain Problems: Organization         Address  Phone   Notes  [facility]  [phone] Patients need to be referred by their primary care doctor.   Medication Assistance: Organization         Address  Phone   Notes  [facility] Medication [facility] [address] [place], [place] [address] [phone] --Must be a resident of [facility] -- Must have NO insurance coverage whatsoever (no Medicaid/ Medicare, etc.) -- The pt. MUST have a primary care doctor that directs their care regularly and follows them in the community   MedAssist  [phone]   [employer]  [phone]    Agencies that provide inexpensive medical care: Organization         Address  Phone   Notes  [place]  [phone]   [facility]    [phone]   [facility] [place],  [address] [phone]   [place] [address] [place] [phone]   Planned Parenthood    [phone]   [facility]    [phone]   [place] and [place] [address], [place] Phone:  [phone], Fax:  [phone] Hours of Operation:  9 am - 6 pm, M-F.  Also accepts Medicaid/Medicare and self-pay.  [facility] for [place] [address], [address], [place] Phone: [phone], Fax: [phone]. Hours of Operation:  8:30 am - 5:30 pm, M-F.  Also accepts Medicaid and self-pay.  [facility] [place] [address] [place] Phone: [phone]   [place], [place], [place] [phone], Ext. [phone] Mondays & Thursdays: 7-9 AM.  First 15 patients are seen on a first come, first serve basis.    [place] Providers:  Organization         Address  Phone   Notes  [facility] [address], [place] [phone] Also accepts self-pay patients.  [facility] [address], [place]  [phone]   [place], [address], [place] [phone]   [facility] [address] [place] [phone]   Lucianne Lei [address], [place]   [phone] Only accepts [place] Access [place] patients after they have their name applied to their card.   Self-Pay (no insurance) in [place]:  Organization         Address  Phone   Notes  Sickle Cell Patients, [facility] [place] [phone]   [facility] [place] [phone]   [facility] [place]  [address], [address], [place] [phone]   [facility]/Dr. Osei-Bonsu  [address] [place] [address], [place] or [address], [place] [phone] Phone number for both [place] and [place] locations is the same.  [facility] [address], [place] [phone]) [phone]   [facility] [place] [address] [place] [address], [place] or [address] [phone]) [phone]   [facility] [address], [place] [phone]) [phone], phone; [phone]) [phone], fax Sees patients 1st and 3rd Saturday of every month.  Must not qualify for public or private insurance (i.e. Medicaid, Medicare, [place] Health Choice, Veterans' Benefits)  Household income should be no more than 200% of the poverty level The clinic cannot treat you if you are pregnant or think you are pregnant  Sexually transmitted diseases are not treated at the clinic.    Dental Care: Organization         Address  Phone  Notes  [facility] [address], [place] [phone]) [phone] Accepts children up to  age 53 who are enrolled in [place]icaid or [place] Health Choice; pregnant women with a Medicaid card; and children who have applied for Medicaid or [place] Health Choice, but were declined, whose parents can pay a reduced fee at time of service.  [facility] [place]  [address], [place] [phone]) [phone] Accepts children up to age 53 who are enrolled in [place]icaid or [place] Health Choice; pregnant women with a Medicaid card; and children who have applied for Medicaid or [place] Health Choice, but were declined, whose parents can pay a reduced fee at time of service.  [facility]  [address], [place] [phone]) [phone] Patients are seen by appointment only. Walk-ins are not accepted. [facility] will see patients 53 years of age and older. Monday - Tuesday (8am-5pm) Most Wednesdays (8:30-5pm) $30 per visit, cash only  [facility]  [address], [facility] [phone]) [phone] Patients are seen by appointment only. Walk-ins are not accepted. [facility] will see patients 53 years of age and older. One Wednesday Evening (Monthly: Volunteer Based).  $30 per visit, cash only  [employer] [facility] [facility]  [phone] for adults; Children under age 224, call Graduate Pediatric Dentistry at [phone]. Children aged 624-14, please call [phone] to request a pediatric application.  Dental services are provided in all areas of dental care including fillings, crowns and bridges, complete and partial dentures, implants, gum treatment, root canals, and extractions. Preventive care is also provided. Treatment is provided to both adults and children. Patients are selected via a lottery and there is often a waiting list.   [facility] [address], [place]  [phone]) [phone] [email or web address]   [facility] [address], [place], [place] [phone], Ext. [phone] Second and Fourth Thursday of  each month, opens at 6:30 AM; Clinic ends at 9 AM.  Patients are seen on a first-come first-served basis, and a limited number are seen during each clinic.   [facility]  [address], [place], [place] [phone]) [phone]   Eligibility Requirements You must have lived in [place], [place], or [place] counties for at least the last three months.   You cannot be eligible for state or federal sponsored National Cityhealthcare insurance, including CIGNAVeterans Administration, [place]icaid, or [employer]icare.  You generally cannot be eligible for healthcare insurance through your employer.    How to apply: Eligibility screenings are held every Tuesday and Wednesday afternoon from 1:00 pm until 4:00 pm. You do not need an appointment for the interview!  [facility] [address] [place], [place]   [place]  [place] Department  [place]  [phone]    Behavioral Health Resources in the Community: Intensive Outpatient Programs Organization         Address  Phone  Notes  [place]. [address], [employer], [place] [phone]   [facility] [address] [place], [place]   ADS: [facility] [address], [place], [place]   [place] [address]  [place], [place] or [phone]   Substance Abuse Resources Organization         Address  Phone  Notes  Alcohol [facility] Drug Services  [phone]   [place]  [phone]   [facility] [place]   Chinita Pester  [phone]   Residential & Outpatient Substance Abuse Program  [phone]   Psychological Services Organization         Address  Phone  Notes  [facility] [place]  [place]  [phone]   [place] [address],  [place] or [phone]    Mobile Crisis Teams Organization         Address  Phone  Notes  [facility], Mobile Crisis Care Unit  [phone]   [facility]ive Psychotherapeutic Services  [address] [place], [place]   [place] [address], [place] [phone]    Self-Help/Support Groups Organization         Address  Phone             Notes  [place]. of [place] - variety of support groups  [place] Call for more information  Narcotics Anonymous (NA), [facility] [address], [employer] [place]  2 meetings at this location   Special educational needs teacher         Address  Phone  Notes  [facility]idential Treatment [place],    [place]  [phone]   [facility]  [address] [place] 902409, [place], [place]   [place], [place] [phone] Admissions: 8am-3pm M-F  Incentives Substance [place] [address],    [place], [place] [phone]   [facility] [address] [place], [place], [place]   [facility] [address]  [place], [place]   [facility] - Intensive Outpatient [address], [place], [place]   [facility] [address]  [place], [place] [phone] or [phone]   [facility] (RTS) [address] [place], [place] Accepts Medicaid  [facility] [place] [address],  [place] [phone] Substance Abuse/Addiction Treatment   [facility] Organization         Address  Phone  Notes  [facility]  [phone]   Domenic Schwab, PhD [address] [place], [place]   [phone] or [phone]   [facility]   [phone]  [address] [place], [place] [phone]     [facility] [address], [place], [place] [phone] Insurance/Medicaid/sponsorship through [facility] and Families [address], Ste [place], [place] [phone] [place], [place] [phone]    Dr. Adele Schilder  [phone]   [facility]. 1) [address] [employer] 2) [place] 3)  [place] [address], [place] [phone]  [phone]   [place] [phone] or [phone] (After Hours)

## 2014-04-16 NOTE — ED Provider Notes (Signed)
CSN: [id number]     Arrival date & time 04/16/14  1424 History  This chart was scribed for non-physician practitioner, Jamse Mead, working with Neta Ehlers, MD, by Allena Earing ED Scribe. This patient was seen in WTR7/WTR7 and the patient's care was started at 3:45 PM.    Chief Complaint  Patient presents with  . Rash      The history is provided by the patient. No language interpreter was used.    HPI Comments: Roger Crosby is a 53 y.o. male who presents to the Emergency Department complaining of a rash he got after coming into contact with poison oak, the rash is present on his left hand and neck. The rash has been present for a week and was initially on his hands and has since spread. 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 He denies SOB, CP, numbness, tingling, fever, throat closing sensation, and cough. Pt has not tried any OTC medications.  Pt has a cardiologist.   Past Medical History  Diagnosis Date  . HTN (hypertension)   . GERD (gastroesophageal reflux disease)   . LBP (low back pain)   . LBBB (left bundle branch block)     dx 2011  . Hx of echocardiogram     echo 5/11: EF 45-50%, mild HK of Ap AS wall, ap Ant wall and true apex (LAD distribution), mild LAE  . NICM (nonischemic cardiomyopathy)   . CAD (coronary artery disease)     nonobstructive by [facility] 6/11: oOM1 40%, mild luminal irregs elsewhere  . CKD (chronic kidney disease)   . Systolic heart failure    Past Surgical History  Procedure Laterality Date  . Cardiac catheterization Left 9/14    with angiogram   Family History  Problem Relation Age of Onset  . Heart attack Mother     in 47's  . Heart disease Mother     ischemic  . Heart attack Father     in 65's  . Heart disease Father     ischemic  . Congestive Heart Failure Mother   . Diabetes Mother   . Hypertension Mother   . Heart Problems Father   . Crohn's disease Sister   . Prostate cancer  Brother   . HIV/AIDS Brother    History  Substance Use Topics  . Smoking status: Current Some Day Smoker  . Smokeless tobacco: Not on file  . Alcohol Use: No    Review of Systems  Constitutional: Negative for fever, diaphoresis, activity change and appetite change.  HENT: Negative for congestion, mouth sores, nosebleeds, postnasal drip, rhinorrhea, sinus pressure, sneezing and sore throat.   Respiratory: Negative for cough, choking, chest tightness and shortness of breath.   Cardiovascular: Negative for chest pain.  Gastrointestinal: Negative for abdominal pain.  Musculoskeletal: Negative for back pain.  Skin: Positive for rash.  Neurological: Negative for tremors, syncope, numbness and headaches.  All other systems reviewed and are negative.     Allergies  Review of patient's allergies indicates no known allergies.  Home Medications   Prior to Admission medications   Medication Sig Start Date End Date Taking? Authorizing Provider  aspirin EC 81 MG tablet Take 1 tablet (81 mg total) by mouth daily. 08/29/12   Liliane Shi, PA-C  carvedilol (COREG) 12.5 MG tablet Take 1 tablet (12.5 mg total) by mouth 2 (two) times daily with a meal. 12/14/13   Rande Brunt, NP  furosemide (LASIX) 20 MG tablet Take 1 tablet (20 mg total) by mouth daily. 12/14/13   Rande Brunt, NP  HYDROcodone-acetaminophen (NORCO) 5-325 MG per tablet Take 2 tablets by mouth every 4 (four) hours as needed. 12/21/13   Richarda Blade, MD  lisinopril (PRINIVIL,ZESTRIL) 20 MG tablet Take 1 tablet (20 mg total) by mouth 2 (two) times daily. 12/14/13   Rande Brunt, NP  omeprazole (PRILOSEC) 20 MG capsule TAKE ONE CAPSULE BY MOUTH EVERY DAY 12/14/13   Rande Brunt, NP  ondansetron (ZOFRAN) 8 MG tablet Take 1 tablet (8 mg total) by mouth every 8 (eight) hours as needed for nausea or vomiting. 12/21/13   Richarda Blade, MD  spironolactone (ALDACTONE) 25 MG tablet Take 1 tablet (25 mg total) by mouth daily. 12/14/13    Rande Brunt, NP   BP 141/88  Pulse 98  Temp(Src) 98.3 F (36.8 C) (Oral)  Resp 20  SpO2 97% Physical Exam  Nursing note and vitals reviewed. Constitutional: He is oriented to person, place, and time. He appears well-developed and well-nourished.  HENT:  Head: Normocephalic and atraumatic.  Mouth/Throat: Oropharynx is clear and moist. No oropharyngeal exudate.  Eyes: Conjunctivae and EOM are normal. Pupils are equal, round, and reactive to light. Right eye exhibits no discharge. Left eye exhibits no discharge.  Neck: Normal range of motion. Neck supple. No tracheal deviation present.  Cardiovascular: Normal rate, regular rhythm and normal heart sounds.  Exam reveals no friction rub.   No murmur heard. Pulses:      Radial pulses are 2+ on the right side, and 2+ on the left side.  Pulmonary/Chest: Effort normal and breath sounds normal. No respiratory distress. He has no wheezes. He has no rales.  Patient is able to speak in full sentences without difficulty Negative use of accessory muscles Negative stridor Negative active drooling  Musculoskeletal: Normal range of motion.  Full ROM to upper and lower extremities without difficulty noted, negative ataxia noted.  Lymphadenopathy:    He has no cervical adenopathy.  Neurological: He is alert and oriented to person, place, and time. No cranial nerve deficit. He exhibits normal muscle tone. Coordination normal.  Cranial nerves III-XII grossly intact  Skin: Skin is dry. Rash noted.  Papular rash with erythematous base identified to the dorsal aspect of the left hand more so than the right and negative neck. Negative active drainage, bleeding noted. Negative cellulitic findings. Negative bullous presentation.   Psychiatric: He has a normal mood and affect. His behavior is normal. Thought content normal.    ED Course  Procedures (including critical care time)  DIAGNOSTIC STUDIES: Oxygen Saturation is 97% on RA, normal by my  interpretation.    COORDINATION OF CARE:    3:51 PM-Discussed treatment plan which includes   with pt at bedside and pt agreed to plan.   Labs Review Labs Reviewed - No data to display  Imaging Review No results found.   EKG Interpretation None      MDM   Final diagnoses:  Poison ivy dermatitis    Filed Vitals:   04/16/14 1443  BP: 141/88  Pulse: 98  Temp: 98.3 F (36.8 C)  TempSrc: Oral  Resp: 20  SpO2: 97%   I personally performed the services described in this documentation, which was scribed in my presence. The recorded information has been reviewed and is accurate.  Doubt SJS. Doubt erythema multiforme major and minor. Doubt zoster. Doubt shingles. Suspicion to be poison oak  dermatitis secondary to job - patient is a Development worker, international aid. Patient reports he has had this numerous times in the past where he is discharged with prednisone. Patient stable, afebrile. Patient not septic appearing. Discharged patient. Discharge patient with prednisone. Discussed with patient to rest and stay hydrated. Discussed with patient to avoid any physical strenuous activity. Referred to health and wellness Center, dermatologist. Discussed with patient to closely monitor symptoms and if symptoms are to worsen or change to report back to the ED - strict return instructions given.  Patient agreed to plan of care, understood, all questions answered.   Jamse Mead, PA-C 04/16/14 [phone]

## 2014-04-16 NOTE — ED Provider Notes (Signed)
Medical screening examination/treatment/procedure(s) were performed by non-physician practitioner and as supervising physician I was immediately available for consultation/collaboration.    Neta Ehlers, MD 04/16/14 [phone]

## 2014-04-16 NOTE — ED Notes (Signed)
Pt works outside.  States rash for over a week.  Feels same as in past with poison oak.  Lotions at home not working.  Has had steroid tx in past for same.

## 2014-08-21 ENCOUNTER — Emergency Department (HOSPITAL_COMMUNITY)
Admission: EM | Admit: 2014-08-21 | Discharge: 2014-08-21 | Disposition: A | Discharge status: Home / Self Care | Payer: Medicaid Other | Attending: Emergency Medicine | Admitting: Emergency Medicine

## 2014-08-21 ENCOUNTER — Emergency Department (HOSPITAL_COMMUNITY): Payer: Medicaid Other

## 2014-08-21 ENCOUNTER — Encounter (HOSPITAL_COMMUNITY): Payer: Self-pay | Admitting: Emergency Medicine

## 2014-08-21 DIAGNOSIS — Z79899 Other long term (current) drug therapy: Secondary | ICD-10-CM | POA: Insufficient documentation

## 2014-08-21 DIAGNOSIS — F17200 Nicotine dependence, unspecified, uncomplicated: Secondary | ICD-10-CM | POA: Diagnosis not present

## 2014-08-21 DIAGNOSIS — I5020 Unspecified systolic (congestive) heart failure: Secondary | ICD-10-CM | POA: Insufficient documentation

## 2014-08-21 DIAGNOSIS — M545 Low back pain, unspecified: Secondary | ICD-10-CM | POA: Insufficient documentation

## 2014-08-21 DIAGNOSIS — R0602 Shortness of breath: Secondary | ICD-10-CM | POA: Insufficient documentation

## 2014-08-21 DIAGNOSIS — I2510 Atherosclerotic heart disease of native coronary artery without angina pectoris: Secondary | ICD-10-CM | POA: Diagnosis not present

## 2014-08-21 DIAGNOSIS — Z9889 Other specified postprocedural states: Secondary | ICD-10-CM | POA: Diagnosis not present

## 2014-08-21 DIAGNOSIS — I129 Hypertensive chronic kidney disease with stage 1 through stage 4 chronic kidney disease, or unspecified chronic kidney disease: Secondary | ICD-10-CM | POA: Insufficient documentation

## 2014-08-21 DIAGNOSIS — Z7982 Long term (current) use of aspirin: Secondary | ICD-10-CM | POA: Insufficient documentation

## 2014-08-21 DIAGNOSIS — K219 Gastro-esophageal reflux disease without esophagitis: Secondary | ICD-10-CM | POA: Insufficient documentation

## 2014-08-21 DIAGNOSIS — N189 Chronic kidney disease, unspecified: Secondary | ICD-10-CM | POA: Diagnosis not present

## 2014-08-21 LAB — CBC
HEMATOCRIT: 45.7 % (ref 39.0–52.0)
HEMOGLOBIN: 15.1 g/dL (ref 13.0–17.0)
MCH: 26.5 pg (ref 26.0–34.0)
MCHC: 33 g/dL (ref 30.0–36.0)
MCV: 80.3 fL (ref 78.0–100.0)
Platelets: 282 10*3/uL (ref 150–400)
RBC: 5.69 MIL/uL (ref 4.22–5.81)
RDW: 14.3 % (ref 11.5–15.5)
WBC: 7.3 10*3/uL (ref 4.0–10.5)

## 2014-08-21 LAB — COMPREHENSIVE METABOLIC PANEL
ALK PHOS: 79 U/L (ref 39–117)
ALT: 15 U/L (ref 0–53)
AST: 14 U/L (ref 0–37)
Albumin: 3.5 g/dL (ref 3.5–5.2)
Anion gap: 9 (ref 5–15)
BILIRUBIN TOTAL: 0.4 mg/dL (ref 0.3–1.2)
BUN: 14 mg/dL (ref 6–23)
CHLORIDE: 106 meq/L (ref 96–112)
CO2: 26 mEq/L (ref 19–32)
Calcium: 8.8 mg/dL (ref 8.4–10.5)
Creatinine, Ser: 1.44 mg/dL — ABNORMAL HIGH (ref 0.50–1.35)
GFR calc non Af Amer: 54 mL/min — ABNORMAL LOW (ref >90)
GFR, EST AFRICAN AMERICAN: 63 mL/min — AB (ref >90)
GLUCOSE: 89 mg/dL (ref 70–99)
POTASSIUM: 4.4 meq/L (ref 3.7–5.3)
SODIUM: 141 meq/L (ref 137–147)
Total Protein: 6.9 g/dL (ref 6.0–8.3)

## 2014-08-21 LAB — I-STAT TROPONIN, ED: Troponin i, poc: 0 ng/mL (ref 0.00–0.08)

## 2014-08-21 MED ORDER — OXYCODONE-ACETAMINOPHEN 5-325 MG PO TABS
1.0000 | ORAL_TABLET | Freq: Four times a day (QID) | ORAL | Status: DC | PRN
Start: 2014-08-21 — End: 2014-10-24

## 2014-08-21 MED ORDER — HYDROCODONE-ACETAMINOPHEN 5-325 MG PO TABS
2.0000 | ORAL_TABLET | Freq: Once | ORAL | Status: AC
Start: 2014-08-21 — End: 2014-08-21
  Administered 2014-08-21: 2 via ORAL
  Filled 2014-08-21: qty 2

## 2014-08-21 NOTE — ED Notes (Signed)
Sob and back pain since Sunday am no n/v denies cp at this time

## 2014-08-21 NOTE — Discharge Instructions (Signed)
It was our pleasure to provide your ER care today - we hope that you feel better.  You may take percocet as need for pain. No driving when taking percocet. Also, do not take tylenol or acetaminophen containing medication when taking percocet.  Rest. Avoid heavy lifting > 20 lbs or bending at waist for the next week. Follow up with primary care doctor for recheck in 1 week.  Return to ER if worse, new symptoms, fevers, chest pain, trouble breathing, leg numbness/weakness, other concern.  You were given pain medication in the ER - no driving for the next 4 hours.    Back Pain, Adult Low back pain is very common. About 1 in 5 people have back pain.The cause of low back pain is rarely dangerous. The pain often gets better over time.About half of people with a sudden onset of back pain feel better in just 2 weeks. About 8 in 10 people feel better by 6 weeks.  CAUSES Some common causes of back pain include:  Strain of the muscles or ligaments supporting the spine.  Wear and tear (degeneration) of the spinal discs.  Arthritis.  Direct injury to the back. DIAGNOSIS Most of the time, the direct cause of low back pain is not known.However, back pain can be treated effectively even when the exact cause of the pain is unknown.Answering your caregiver's questions about your overall health and symptoms is one of the most accurate ways to make sure the cause of your pain is not dangerous. If your caregiver needs more information, he or she may order lab work or imaging tests (X-rays or MRIs).However, even if imaging tests show changes in your back, this usually does not require surgery. HOME CARE INSTRUCTIONS For many people, back pain returns.Since low back pain is rarely dangerous, it is often a condition that people can learn to [facility] their own.   Remain active. It is stressful on the back to sit or stand in one place. Do not sit, drive, or stand in one place for more than 30 minutes at a  time. Take short walks on level surfaces as soon as pain allows.Try to increase the length of time you walk each day.  Do not stay in bed.Resting more than 1 or 2 days can delay your recovery.  Do not avoid exercise or work.Your body is made to move.It is not dangerous to be active, even though your back may hurt.Your back will likely heal faster if you return to being active before your pain is gone.  Pay attention to your body when you bend and lift. Many people have less discomfortwhen lifting if they bend their knees, keep the load close to their bodies,and avoid twisting. Often, the most comfortable positions are those that put less stress on your recovering back.  Find a comfortable position to sleep. Use a firm mattress and lie on your side with your knees slightly bent. If you lie on your back, put a pillow under your knees.  Only take over-the-counter or prescription medicines as directed by your caregiver. Over-the-counter medicines to reduce pain and inflammation are often the most helpful.Your caregiver may prescribe muscle relaxant drugs.These medicines help dull your pain so you can more quickly return to your normal activities and healthy exercise.  Put ice on the injured area.  Put ice in a plastic bag.  Place a towel between your skin and the bag.  Leave the ice on for 15-20 minutes, 03-04 times a day for the first 2  to 3 days. After that, ice and heat may be alternated to reduce pain and spasms.  Ask your caregiver about trying back exercises and gentle massage. This may be of some benefit.  Avoid feeling anxious or stressed.Stress increases muscle tension and can worsen back pain.It is important to recognize when you are anxious or stressed and learn ways to manage it.Exercise is a great option. SEEK MEDICAL CARE IF:  You have pain that is not relieved with rest or medicine.  You have pain that does not improve in 1 week.  You have new symptoms.  You are  generally not feeling well. SEEK IMMEDIATE MEDICAL CARE IF:   You have pain that radiates from your back into your legs.  You develop new bowel or bladder control problems.  You have unusual weakness or numbness in your arms or legs.  You develop nausea or vomiting.  You develop abdominal pain.  You feel faint. Document Released: 11/16/2005 Document Revised: 05/17/2012 Document Reviewed: 03/20/2014 [facility] Patient Information 2015 [place], [place]. This information is not intended to replace advice given to you by your health care provider. Make sure you discuss any questions you have with your health care provider.  Degenerative Disk Disease Degenerative disk disease is a condition caused by the changes that occur in the cushions of the backbone (spinal disks) as you grow older. Spinal disks are soft and compressible disks located between the bones of the spine (vertebrae). They act like shock absorbers. Degenerative disk disease can affect the whole spine. However, the neck and lower back are most commonly affected. Many changes can occur in the spinal disks with aging, such as:  The spinal disks may dry and shrink.  Small tears may occur in the tough, outer covering of the disk (annulus).  The disk space may become smaller due to loss of water.  Abnormal growths in the bone (spurs) may occur. This can put pressure on the nerve roots exiting the spinal canal, causing pain.  The spinal canal may become narrowed. CAUSES  Degenerative disk disease is a condition caused by the changes that occur in the spinal disks with aging. The exact cause is not known, but there is a genetic basis for many patients. Degenerative changes can occur due to loss of fluid in the disk. This makes the disk thinner and reduces the space between the backbones. Small cracks can develop in the outer layer of the disk. This can lead to the breakdown of the disk. You are more likely to get degenerative disk  disease if you are overweight. Smoking cigarettes and doing heavy work such as weightlifting can also increase your risk of this condition. Degenerative changes can start after a sudden injury. Growth of bone spurs can compress the nerve roots and cause pain.  SYMPTOMS  The symptoms vary from person to person. Some people may have no pain, while others have severe pain. The pain may be so severe that it can limit your activities. The location of the pain depends on the part of your backbone that is affected. You will have neck or arm pain if a disk in the neck area is affected. You will have pain in your back, buttocks, or legs if a disk in the lower back is affected. The pain becomes worse while bending, reaching up, or with twisting movements. The pain may start gradually and then get worse as time passes. It may also start after a major or minor injury. You may feel numbness or tingling  in the arms or legs.  DIAGNOSIS  Your caregiver will ask you about your symptoms and about activities or habits that may cause the pain. He or she may also ask about any injuries, diseases, or treatments you have had earlier. Your caregiver will examine you to check for the range of movement that is possible in the affected area, to check for strength in your extremities, and to check for sensation in the areas of the arms and legs supplied by different nerve roots. An X-ray of the spine may be taken. Your caregiver may suggest other imaging tests, such as magnetic resonance imaging (MRI), if needed.  TREATMENT  Treatment includes rest, modifying your activities, and applying ice and heat. Your caregiver may prescribe medicines to reduce your pain and may ask you to do some exercises to strengthen your back. In some cases, you may need surgery. You and your caregiver will decide on the treatment that is best for you. HOME CARE INSTRUCTIONS   Follow proper lifting and walking techniques as advised by your  caregiver.  Maintain good posture.  Exercise regularly as advised.  Perform relaxation exercises.  Change your sitting, standing, and sleeping habits as advised. Change positions frequently.  Lose weight as advised.  Stop smoking if you smoke.  Wear supportive footwear. SEEK MEDICAL CARE IF:  Your pain does not go away within 1 to 4 weeks. SEEK IMMEDIATE MEDICAL CARE IF:   Your pain is severe.  You notice weakness in your arms, hands, or legs.  You begin to lose control of your bladder or bowel movements. MAKE SURE YOU:   Understand these instructions.  Will watch your condition.  Will get help right away if you are not doing well or get worse. Document Released: 09/13/2007 Document Revised: 02/08/2012 Document Reviewed: 03/20/2014 [facility] Patient Information 2015 [place], [place]. This information is not intended to replace advice given to you by your health care provider. Make sure you discuss any questions you have with your health care provider.

## 2014-08-21 NOTE — ED Provider Notes (Signed)
CSN: [id number]     Arrival date & time 08/21/14  1221 History   First MD Initiated Contact with Patient 08/21/14 1539     Chief Complaint  Patient presents with  . Shortness of Breath     (Consider location/radiation/quality/duration/timing/severity/associated sxs/prior Treatment) Patient is a 53 y.o. male presenting with shortness of breath. The history is provided by the patient.  Shortness of Breath Associated symptoms: no abdominal pain, no chest pain, no cough, no fever, no headaches, no neck pain, no rash, no sore throat and no vomiting   pt with hx non ischemic cm, cath without signif cad 1 yr ago, c/o low back pain in the past 2-3 days. Constant. Dull. Right lower back, radiates to right leg. No leg numbness/weakness. No perineal numbness. No gi or gu complaints. Denies hx chronic back pain or ddd. Denies specific injury or strain. No redness/rash to area of pain. No anterior/abd/flank pain. No dysuria or hematuria. No fever or chills. w cm, states no cp or discomfort. No swelling. No new orthopnea or pnd. States at baseline feels mild sob at times.  Compliant w normal meds, no recent change in meds/doses. No wt increase. No cough or uri c/o.        Past Medical History  Diagnosis Date  . HTN (hypertension)   . GERD (gastroesophageal reflux disease)   . LBP (low back pain)   . LBBB (left bundle branch block)     dx 2011  . Hx of echocardiogram     echo 5/11: EF 45-50%, mild HK of Ap AS wall, ap Ant wall and true apex (LAD distribution), mild LAE  . NICM (nonischemic cardiomyopathy)   . CAD (coronary artery disease)     nonobstructive by [facility] 6/11: oOM1 40%, mild luminal irregs elsewhere  . CKD (chronic kidney disease)   . Systolic heart failure    Past Surgical History  Procedure Laterality Date  . Cardiac catheterization Left 9/14    with angiogram   Family History  Problem Relation Age of Onset  . Heart attack Mother     in 67's  . Heart disease Mother      ischemic  . Heart attack Father     in 25's  . Heart disease Father     ischemic  . Congestive Heart Failure Mother   . Diabetes Mother   . Hypertension Mother   . Heart Problems Father   . Crohn's disease Sister   . Prostate cancer Brother   . HIV/AIDS Brother    History  Substance Use Topics  . Smoking status: Current Some Day Smoker  . Smokeless tobacco: Not on file  . Alcohol Use: No    Review of Systems  Constitutional: Negative for fever and chills.  HENT: Negative for sore throat.   Eyes: Negative for redness.  Respiratory: Positive for shortness of breath. Negative for cough.   Cardiovascular: Negative for chest pain, palpitations and leg swelling.  Gastrointestinal: Negative for vomiting, abdominal pain and diarrhea.  Endocrine: Negative for polyuria.  Genitourinary: Negative for flank pain.  Musculoskeletal: Positive for back pain. Negative for neck pain.  Skin: Negative for rash.  Neurological: Negative for weakness, numbness and headaches.  Hematological: Does not bruise/bleed easily.  Psychiatric/Behavioral: Negative for confusion.      Allergies  Review of patient's allergies indicates no known allergies.  Home Medications   Prior to Admission medications   Medication Sig Start Date End Date Taking? Authorizing Provider  aspirin EC 81 MG  tablet Take 1 tablet (81 mg total) by mouth daily. 08/29/12   Liliane Shi, PA-C  carvedilol (COREG) 12.5 MG tablet Take 1 tablet (12.5 mg total) by mouth 2 (two) times daily with a meal. 12/14/13   Rande Brunt, NP  furosemide (LASIX) 20 MG tablet Take 1 tablet (20 mg total) by mouth daily. 12/14/13   Rande Brunt, NP  HYDROcodone-acetaminophen (NORCO) 5-325 MG per tablet Take 2 tablets by mouth every 4 (four) hours as needed. 12/21/13   Richarda Blade, MD  lisinopril (PRINIVIL,ZESTRIL) 20 MG tablet Take 1 tablet (20 mg total) by mouth 2 (two) times daily. 12/14/13   Rande Brunt, NP  omeprazole (PRILOSEC) 20 MG  capsule TAKE ONE CAPSULE BY MOUTH EVERY DAY 12/14/13   Rande Brunt, NP  ondansetron (ZOFRAN) 8 MG tablet Take 1 tablet (8 mg total) by mouth every 8 (eight) hours as needed for nausea or vomiting. 12/21/13   Richarda Blade, MD  predniSONE (DELTASONE) 20 MG tablet 3 tabs po daily x 3 days, then 2 tabs x 3 days, then 1.5 tabs x 3 days, then 1 tab x 3 days, then 0.5 tabs x 3 days 04/16/14   Marissa Sciacca, PA-C  spironolactone (ALDACTONE) 25 MG tablet Take 1 tablet (25 mg total) by mouth daily. 12/14/13   Rande Brunt, NP   BP 137/87  Pulse 80  Temp(Src) 98 F (36.7 C) (Oral)  Resp 23  SpO2 97% Physical Exam  ED Course  Procedures (including critical care time) Labs Review  Results for orders placed during the hospital encounter of 08/21/14  CBC      Result Value Ref Range   WBC 7.3  4.0 - 10.5 K/uL   RBC 5.69  4.22 - 5.81 MIL/uL   Hemoglobin 15.1  13.0 - 17.0 g/dL   HCT 45.7  39.0 - 52.0 %   MCV 80.3  78.0 - 100.0 fL   MCH 26.5  26.0 - 34.0 pg   MCHC 33.0  30.0 - 36.0 g/dL   RDW 14.3  11.5 - 15.5 %   Platelets 282  150 - 400 K/uL  COMPREHENSIVE METABOLIC PANEL      Result Value Ref Range   Sodium 141  137 - 147 mEq/L   Potassium 4.4  3.7 - 5.3 mEq/L   Chloride 106  96 - 112 mEq/L   CO2 26  19 - 32 mEq/L   Glucose, Bld 89  70 - 99 mg/dL   BUN 14  6 - 23 mg/dL   Creatinine, Ser 1.44 (*) 0.50 - 1.35 mg/dL   Calcium 8.8  8.4 - 10.5 mg/dL   Total Protein 6.9  6.0 - 8.3 g/dL   Albumin 3.5  3.5 - 5.2 g/dL   AST 14  0 - 37 U/L   ALT 15  0 - 53 U/L   Alkaline Phosphatase 79  39 - 117 U/L   Total Bilirubin 0.4  0.3 - 1.2 mg/dL   GFR calc non Af Amer 54 (*) >90 mL/min   GFR calc Af Amer 63 (*) >90 mL/min   Anion gap 9  5 - 15  I-STAT TROPOININ, ED      Result Value Ref Range   Troponin i, poc 0.00  0.00 - 0.08 ng/mL   Comment 3            Dg Chest 2 View  08/21/2014   CLINICAL DATA:  Shortness of breath  EXAM: CHEST  2 VIEW  COMPARISON:  December 21, 2013  FINDINGS: The  heart size and mediastinal contours are within normal limits. There is no focal infiltrate, pulmonary edema, or pleural effusion. The visualized skeletal structures are unremarkable.  IMPRESSION: No active cardiopulmonary disease.   Electronically Signed   By: Abelardo Diesel M.D.   On: 08/21/2014 13:35   Dg Lumbar Spine Complete  08/21/2014   CLINICAL DATA:  Pain  EXAM: LUMBAR SPINE - COMPLETE 4+ VIEW  COMPARISON:  None.  FINDINGS: Five views of the lumbar spine submitted. No acute fracture or subluxation. Moderate disc space flattening with anterior spurring at L4-L5 and L5-S1 level. Anterior spurring noted upper endplate of L3 vertebral body. Facet degenerative changes L4 and L5 level.  IMPRESSION: No acute fracture or subluxation. Degenerative changes as described above.   Electronically Signed   By: Lahoma Crocker M.D.   On: 08/21/2014 16:40      EKG Interpretation   Date/Time:  Tuesday August 21 2014 12:34:06 EDT Ventricular Rate:  87 PR Interval:  132 QRS Duration: 146 QT Interval:  396 QTC Calculation: 476 R Axis:   47 Text Interpretation:  Normal sinus rhythm Left bundle branch block No  significant change since last tracing Confirmed by Ashok Cordia  MD, Lennette Bihari  ([id number]) on 08/21/2014 3:36:09 PM      MDM  Labs/cxr done prior triage.  Pt states his main reason for coming to ed is low back pain, esp right, that occasionally radiates to right leg.  He states he has chronic issues w mild doe, no recent or abrupt change.  cxr neg. Lungs clear. +lumbar tenderness.  Lumbar xr.  Pt states has ride, does not have to drive. vicodin po.  Reviewed nursing notes and prior charts for additional history.   Recheck pt breathing comfortably. No cp or sob. Pulse ox 99%.   Spine without persistent focal bony tenderness. Pt afeb. Normal neuro exam.  Pt currently appears medically stable for d/c.  rec close pcp follow up.   Mirna Mires, MD 08/21/14 [phone]

## 2014-08-21 NOTE — ED Notes (Signed)
Pt to xray

## 2014-08-22 ENCOUNTER — Ambulatory Visit (HOSPITAL_COMMUNITY)
Admission: RE | Admit: 2014-08-22 | Discharge: 2014-08-22 | Disposition: A | Discharge status: Home / Self Care | Payer: Medicaid Other | Source: Ambulatory Visit | Attending: Internal Medicine | Admitting: Internal Medicine

## 2014-08-22 VITALS — BP 134/84 | HR 83 | Wt 225.5 lb

## 2014-08-22 DIAGNOSIS — I129 Hypertensive chronic kidney disease with stage 1 through stage 4 chronic kidney disease, or unspecified chronic kidney disease: Secondary | ICD-10-CM | POA: Insufficient documentation

## 2014-08-22 DIAGNOSIS — Z8249 Family history of ischemic heart disease and other diseases of the circulatory system: Secondary | ICD-10-CM | POA: Insufficient documentation

## 2014-08-22 DIAGNOSIS — R5381 Other malaise: Secondary | ICD-10-CM | POA: Diagnosis not present

## 2014-08-22 DIAGNOSIS — I447 Left bundle-branch block, unspecified: Secondary | ICD-10-CM

## 2014-08-22 DIAGNOSIS — I428 Other cardiomyopathies: Secondary | ICD-10-CM | POA: Insufficient documentation

## 2014-08-22 DIAGNOSIS — K219 Gastro-esophageal reflux disease without esophagitis: Secondary | ICD-10-CM | POA: Insufficient documentation

## 2014-08-22 DIAGNOSIS — I5022 Chronic systolic (congestive) heart failure: Secondary | ICD-10-CM | POA: Diagnosis not present

## 2014-08-22 DIAGNOSIS — Z7982 Long term (current) use of aspirin: Secondary | ICD-10-CM | POA: Diagnosis not present

## 2014-08-22 DIAGNOSIS — N189 Chronic kidney disease, unspecified: Secondary | ICD-10-CM | POA: Diagnosis not present

## 2014-08-22 DIAGNOSIS — I509 Heart failure, unspecified: Secondary | ICD-10-CM | POA: Diagnosis not present

## 2014-08-22 DIAGNOSIS — R5383 Other fatigue: Secondary | ICD-10-CM

## 2014-08-22 DIAGNOSIS — Z79899 Other long term (current) drug therapy: Secondary | ICD-10-CM | POA: Insufficient documentation

## 2014-08-22 DIAGNOSIS — I10 Essential (primary) hypertension: Secondary | ICD-10-CM

## 2014-08-22 LAB — BASIC METABOLIC PANEL
Anion gap: 11 (ref 5–15)
BUN: 11 mg/dL (ref 6–23)
CALCIUM: 8.8 mg/dL (ref 8.4–10.5)
CO2: 24 mEq/L (ref 19–32)
Chloride: 107 mEq/L (ref 96–112)
Creatinine, Ser: 1.38 mg/dL — ABNORMAL HIGH (ref 0.50–1.35)
GFR calc Af Amer: 66 mL/min — ABNORMAL LOW (ref >90)
GFR calc non Af Amer: 57 mL/min — ABNORMAL LOW (ref >90)
GLUCOSE: 91 mg/dL (ref 70–99)
POTASSIUM: 4.5 meq/L (ref 3.7–5.3)
Sodium: 142 mEq/L (ref 137–147)

## 2014-08-22 LAB — PRO B NATRIURETIC PEPTIDE: PRO B NATRI PEPTIDE: 659.6 pg/mL — AB (ref 0–125)

## 2014-08-22 MED ORDER — SACUBITRIL-VALSARTAN 49-51 MG PO TABS: 1.0000 | ORAL_TABLET | Freq: Two times a day (BID) | ORAL | Status: DC

## 2014-08-22 NOTE — Patient Instructions (Signed)
Stop Lisinopril  Start Entresto 49/51 mg Twice daily on Friday AM  Make sure to take Furosemide (Lasix) every day  Labs today  Your physician has requested that you have an echocardiogram. Echocardiography is a painless test that uses sound waves to create images of your heart. It provides your doctor with information about the size and shape of your heart and how well your heart's chambers and valves are working. This procedure takes approximately one hour. There are no restrictions for this procedure.  Your physician has recommended that you have a cardiopulmonary stress test (CPX). CPX testing is a non-invasive measurement of heart and lung function. It replaces a traditional treadmill stress test. This type of test provides a tremendous amount of information that relates not only to your present condition but also for future outcomes. This test combines measurements of you ventilation, respiratory gas exchange in the lungs, electrocardiogram (EKG), blood pressure and physical response before, during, and following an exercise protocol.  Your physician recommends that you schedule a follow-up appointment in: 1 month

## 2014-08-22 NOTE — Progress Notes (Signed)
Patient ID: Roger Crosby, male   DOB: 26-May-1961, 53 y.o.   MRN: [id number]  Roger Crosby is 53yo with history of systolic HF due to NICM . EF 04/2013 20-25%, HTN, LBBB and CKD. Dr. Aundra Dubin did a left heart cath in 07/2013 that showed no obstructive disease.   Follow up: Returns for f/u. Was in [place] and hasn't been here since January 2015. Says he has been complaint with meds despite his absence. More SOB. Weight up a couple pounds. Weighing only once a week. Can only walk 100 yards or less before getting SOB. Occasional edema. No PND or orthopnea. Says he isn't taking his lasix as much because it makes him go to the bathroom. Mild snoring. + daytime fatigue.   Labs (9/14): K 3.9, creatinine 1.6 Labs (10/14): K 4, creatinine 1.3 Labs (10/05/13): K+ 4.1, creatinine 1.5  PMH: 1. Hypertension  2. GERD  3. Low back pain  4. LBBB: Newly noted 2011  5. Nonischemic cardiomyopathy: Echo (5/11): EF 45-50%, mild hypokinesis of the apical anteroseptal wall, apical anterior wall, and true apex (LAD distribution), mild LAE.  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  Echo (6/14) with EF 20-25% with inferior/inferoseptal akinesis and hypokinesis elsewhere.  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  6. CKD   Family History:  Mother deceased CHF,DM,HTN  Father with heart trouble...died age 52.  1 sister Crohn's disease  1 sister healthy  1 brother died Prostate cancer/AIDS...was diagnosed with prostate cancer at age 82.  Father and mother both had MIs in their 8s   Social History:  [employer]; has been working for [employer]. On disability now.. HS graduate.  Married  3 kids  Alcohol use-no. None since 2008  Drug use-prior cocaine none since 2008. Denies IVDU.  Nonsmoker Prior smoker none since 2008.   Review of Systems  All systems reviewed and negative except as per HPI.   Current Outpatient Prescriptions   Medication Sig Dispense Refill  . aspirin EC 81 MG tablet Take 1 tablet (81 mg total) by mouth daily.      . carvedilol (COREG) 12.5 MG tablet Take 1 tablet (12.5 mg total) by mouth 2 (two) times daily with a meal.  60 tablet  6  . furosemide (LASIX) 20 MG tablet Take 1 tablet (20 mg total) by mouth daily.  30 tablet  3  . HYDROcodone-acetaminophen (NORCO) 5-325 MG per tablet Take 2 tablets by mouth every 4 (four) hours as needed.  20 tablet  0  . lisinopril (PRINIVIL,ZESTRIL) 20 MG tablet Take 20 mg by mouth daily.      Marland Kitchen omeprazole (PRILOSEC) 20 MG capsule TAKE ONE CAPSULE BY MOUTH EVERY DAY      . ondansetron (ZOFRAN) 8 MG tablet Take 1 tablet (8 mg total) by mouth every 8 (eight) hours as needed for nausea or vomiting.  20 tablet  0  . oxyCODONE-acetaminophen (PERCOCET/ROXICET) 5-325 MG per tablet Take 1-2 tablets by mouth every 6 (six) hours as needed for severe pain.  20 tablet  0  . spironolactone (ALDACTONE) 25 MG tablet Take 1 tablet (25 mg total) by mouth daily.  30 tablet  3   No current facility-administered medications for this encounter.    Filed Vitals:   08/22/14 1036  BP: 134/84  Pulse: 83  Weight: 225 lb 8 oz (102.286 kg)  SpO2: 97%   General: NAD Neck: JVD 7, no thyromegaly or thyroid  nodule.  Lungs: Clear to auscultation bilaterally with normal respiratory effort. CV: Nondisplaced PMI.  Heart regular S1/S2, no S3/S4, no murmur.  trace peripheral edema.  No carotid bruit.  Normal pedal pulses.  Abdomen: Soft, nontender, no hepatosplenomegaly, no distention.  Neurologic: Alert and oriented x 3.  Psych: Normal affect. Extremities: No clubbing or cyanosis.   Assessment/Plan:  1. Chronic Systolic Heart Failure. NICM. ECHO 04/2013 EF 20-25%. - Continued NYHA III symptoms. - Volume status appears mildly elevated. Will have him restart lasix every day. Check labs today.  - Continue coreg at 12.5 mg BID - Will switch Entresto 100 bid. Stop lisinopril for 2 days then  start Entresto - Continue spironolactone 25 mg daily.  - Reinforced the need and importance of daily weights, a low sodium diet, and fluid restriction (less than 2 L a day). Instructed to call the HF clinic if weight increases more than 3 lbs overnight or 5 lbs in a week.   - Repeat ECHO. If EF remains < 35% will need to refer to EP for CRT-D. Has LBBB.  - Symptoms somewhat out of proportion of physical findings. Check CPX 2. CKD:  - BMET 3. HTN:  - Stable on current regimen. As above change lisinopril to Entresto 4. Fatigue - Will need sleep study in near future.   Glori Bickers MD 08/22/2014

## 2014-08-22 NOTE — Addendum Note (Signed)
Encounter addended by: Scarlette Calico, RN on: 08/22/2014 11:45 AM<BR>     Documentation filed: Medications, Patient Instructions Section, Orders

## 2014-09-24 ENCOUNTER — Encounter (HOSPITAL_COMMUNITY): Payer: Medicaid Other

## 2014-09-24 ENCOUNTER — Ambulatory Visit (HOSPITAL_COMMUNITY): Payer: Medicaid Other

## 2014-10-05 ENCOUNTER — Encounter (HOSPITAL_COMMUNITY): Payer: Medicaid Other

## 2014-10-24 ENCOUNTER — Ambulatory Visit (HOSPITAL_BASED_OUTPATIENT_CLINIC_OR_DEPARTMENT_OTHER)
Admission: RE | Admit: 2014-10-24 | Discharge: 2014-10-24 | Disposition: A | Discharge status: Home / Self Care | Payer: Medicaid Other | Source: Ambulatory Visit | Attending: Internal Medicine | Admitting: Internal Medicine

## 2014-10-24 ENCOUNTER — Encounter (HOSPITAL_COMMUNITY): Payer: Self-pay

## 2014-10-24 ENCOUNTER — Ambulatory Visit (HOSPITAL_COMMUNITY)
Admission: RE | Admit: 2014-10-24 | Discharge: 2014-10-24 | Disposition: A | Discharge status: Home / Self Care | Payer: Medicaid Other | Source: Ambulatory Visit | Attending: Internal Medicine | Admitting: Internal Medicine

## 2014-10-24 ENCOUNTER — Other Ambulatory Visit: Payer: Self-pay

## 2014-10-24 VITALS — BP 128/84 | HR 91 | Wt 232.8 lb

## 2014-10-24 DIAGNOSIS — K219 Gastro-esophageal reflux disease without esophagitis: Secondary | ICD-10-CM | POA: Insufficient documentation

## 2014-10-24 DIAGNOSIS — N189 Chronic kidney disease, unspecified: Secondary | ICD-10-CM | POA: Diagnosis not present

## 2014-10-24 DIAGNOSIS — I447 Left bundle-branch block, unspecified: Secondary | ICD-10-CM

## 2014-10-24 DIAGNOSIS — I059 Rheumatic mitral valve disease, unspecified: Secondary | ICD-10-CM

## 2014-10-24 DIAGNOSIS — Z87898 Personal history of other specified conditions: Secondary | ICD-10-CM | POA: Diagnosis not present

## 2014-10-24 DIAGNOSIS — Z87891 Personal history of nicotine dependence: Secondary | ICD-10-CM | POA: Diagnosis not present

## 2014-10-24 DIAGNOSIS — Z7982 Long term (current) use of aspirin: Secondary | ICD-10-CM | POA: Diagnosis not present

## 2014-10-24 DIAGNOSIS — I10 Essential (primary) hypertension: Secondary | ICD-10-CM

## 2014-10-24 DIAGNOSIS — I5022 Chronic systolic (congestive) heart failure: Secondary | ICD-10-CM | POA: Insufficient documentation

## 2014-10-24 DIAGNOSIS — I429 Cardiomyopathy, unspecified: Secondary | ICD-10-CM | POA: Diagnosis not present

## 2014-10-24 DIAGNOSIS — I129 Hypertensive chronic kidney disease with stage 1 through stage 4 chronic kidney disease, or unspecified chronic kidney disease: Secondary | ICD-10-CM | POA: Insufficient documentation

## 2014-10-24 DIAGNOSIS — R0602 Shortness of breath: Secondary | ICD-10-CM

## 2014-10-24 DIAGNOSIS — M545 Low back pain: Secondary | ICD-10-CM | POA: Insufficient documentation

## 2014-10-24 DIAGNOSIS — Z79899 Other long term (current) drug therapy: Secondary | ICD-10-CM | POA: Diagnosis not present

## 2014-10-24 LAB — BASIC METABOLIC PANEL
ANION GAP: 13 (ref 5–15)
BUN: 15 mg/dL (ref 6–23)
CHLORIDE: 107 meq/L (ref 96–112)
CO2: 21 meq/L (ref 19–32)
Calcium: 9.2 mg/dL (ref 8.4–10.5)
Creatinine, Ser: 1.24 mg/dL (ref 0.50–1.35)
GFR calc Af Amer: 75 mL/min — ABNORMAL LOW (ref >90)
GFR calc non Af Amer: 65 mL/min — ABNORMAL LOW (ref >90)
Glucose, Bld: 77 mg/dL (ref 70–99)
Potassium: 4.4 mEq/L (ref 3.7–5.3)
Sodium: 141 mEq/L (ref 137–147)

## 2014-10-24 LAB — PRO B NATRIURETIC PEPTIDE: PRO B NATRI PEPTIDE: 285.6 pg/mL — AB (ref 0–125)

## 2014-10-24 MED ORDER — CARVEDILOL 12.5 MG PO TABS: 18.7500 mg | ORAL_TABLET | Freq: Two times a day (BID) | ORAL | Status: DC

## 2014-10-24 MED ORDER — SPIRONOLACTONE 25 MG PO TABS: 25.0000 mg | ORAL_TABLET | Freq: Every day | ORAL | Status: DC

## 2014-10-24 NOTE — Progress Notes (Signed)
  Echocardiogram 2D Echocardiogram has been performed.  Roger Crosby 10/24/2014, 11:51 AM

## 2014-10-24 NOTE — Patient Instructions (Addendum)
Increase Carvedilol to 18.75 mg (1 & 1/2 tabs) Twice daily   Restart Spironolactone 25 mg daily  Labs today  Lab in 1 week  You have been referred to Dr Caryl Comes Thursday 11/01/14 at 3:30  Your physician recommends that you schedule a follow-up appointment in: 6 weeks

## 2014-10-24 NOTE — Progress Notes (Signed)
Patient ID: Roger Crosby, male   DOB: 04/17/1961, 53 y.o.   MRN: [id number]  Roger Crosby is 53yo with history of systolic HF due to NICM . EF 04/2013 20-25%, HTN, LBBB and CKD. 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   Follow up: Returns for f/u. We saw him last in 9/15 after a 9 month absence when [place] was in [place]. Lasix restarted and lisinopril switched to Entresto 100 bid. Tolerating Entresto but thinks he ran out of spiro. Was supposed to have CPX but he "missed" the appointment. Weight up 7 pounds. Gets short winded on walking out of house. Also has pressure in center of chest. Symptoms have not changed. Denies edema, orthopnea or PND. Denies snoring.   Echo 10/24/14: EF 25-30% RV ok  Labs (9/14): K 3.9, creatinine 1.6 Labs (10/14): K 4, creatinine 1.3 Labs (10/05/13): K+ 4.1, creatinine 1.5  PMH: 1. Hypertension  2. GERD  3. Low back pain  4. LBBB: Newly noted 2011  5. Nonischemic cardiomyopathy: Echo (5/11): EF 45-50%, mild hypokinesis of the apical anteroseptal wall, apical anterior wall, and true apex (LAD distribution), mild LAE.  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  Echo (6/14) with EF 20-25% with inferior/inferoseptal akinesis and hypokinesis elsewhere.  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  6. CKD   Family History:  Mother deceased CHF,DM,HTN  Father with heart trouble...died age 43.  1 sister Crohn's disease  1 sister healthy  1 brother died Prostate cancer/AIDS...was diagnosed with prostate cancer at age 5.  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 On disability now.. HS graduate.  Married  3 kids  Alcohol use-no. None since 2008  Drug use-prior cocaine none since 2008. Denies IVDU.  Nonsmoker Prior smoker none since 2008.   Review of Systems  All systems reviewed and  negative except as per HPI.   Current Outpatient Prescriptions  Medication Sig Dispense Refill  . aspirin EC 81 MG tablet Take 1 tablet (81 mg total) by mouth daily.    . carvedilol (COREG) 12.5 MG tablet Take 1 tablet (12.5 mg total) by mouth 2 (two) times daily with a meal. 60 tablet 6  . furosemide (LASIX) 20 MG tablet Take 1 tablet (20 mg total) by mouth daily. 30 tablet 3  . HYDROcodone-acetaminophen (NORCO) 5-325 MG per tablet Take 2 tablets by mouth every 4 (four) hours as needed. 20 tablet 0  . omeprazole (PRILOSEC) 20 MG capsule TAKE ONE CAPSULE BY MOUTH EVERY DAY    . Sacubitril-Valsartan (ENTRESTO) 49-51 MG TABS Take 1 tablet by mouth 2 (two) times daily. 60 tablet 3  . spironolactone (ALDACTONE) 25 MG tablet Take 1 tablet (25 mg total) by mouth daily. (Patient not taking: Reported on 10/24/2014) 30 tablet 3   No current facility-administered medications for this encounter.    Filed Vitals:   10/24/14 1146  BP: 128/84  Pulse: 91  Weight: 232 lb 12.8 oz (105.597 kg)  SpO2: 98%   General: NAD Neck: JVD 6, no thyromegaly or thyroid nodule.  Lungs: Clear to auscultation bilaterally with normal respiratory effort. CV: Nondisplaced PMI.  Heart regular S1/S2, no S3/S4, no murmur.  trace peripheral edema.  No carotid bruit.  Normal pedal pulses.  Abdomen: Soft, nontender, no hepatosplenomegaly, no distention.  Neurologic: Alert and oriented x 3.  Psych: Normal affect. Extremities: No clubbing or cyanosis.   Assessment/Plan:  1. Chronic Systolic Heart Failure. NICM. ECHO 04/2013 EF 20-25%.Echo today EF 25-30%  - Persistent NYHA III symptoms. EF persistently < 35% - Will increase coreg at 18.75mg  BID - Continue Entresto 100 bid. Can increase to 200 bid at next visit.  - Resume spironolactone 25 mg daily.  - Check BMET 1 week - Refer to EP for CRT-D. Has LBBB.  ([facility]) - Reinforced the need and importance of daily weights, a low sodium diet, and fluid restriction (less  than 2 L a day). Instructed to call the HF clinic if weight increases more than 3 lbs overnight or 5 lbs in a week.   - Reinforced need to take more responsibility for his medicines and caring for his HF.  - Will need CPX when we can get it  2. CKD:  - BMET today and 1 week.  3. HTN:  - Stable on current regimen. Changes as above 4. Fatigue - Suspect he may have OSA. Not interested in sleep study at this time.  Glori Bickers MD 10/24/2014

## 2014-11-01 ENCOUNTER — Encounter: Payer: Self-pay | Admitting: Internal Medicine

## 2014-11-01 ENCOUNTER — Encounter: Payer: Self-pay | Admitting: *Deleted

## 2014-11-01 ENCOUNTER — Ambulatory Visit (INDEPENDENT_AMBULATORY_CARE_PROVIDER_SITE_OTHER): Payer: Medicaid Other | Admitting: Internal Medicine

## 2014-11-01 VITALS — BP 130/74 | HR 82 | Ht 68.0 in | Wt 228.0 lb

## 2014-11-01 DIAGNOSIS — Z01812 Encounter for preprocedural laboratory examination: Secondary | ICD-10-CM

## 2014-11-01 DIAGNOSIS — I447 Left bundle-branch block, unspecified: Secondary | ICD-10-CM

## 2014-11-01 DIAGNOSIS — I5022 Chronic systolic (congestive) heart failure: Secondary | ICD-10-CM

## 2014-11-01 NOTE — Patient Instructions (Signed)
Your physician recommends that you continue on your current medications as directed. Please refer to the Current Medication list given to you today.  Your physician recommends that you return for pre-procedure lab work on: 12/07/14  CRT - Defibrillator or cardiac resynchronization therapy is a treatment used to correct heart failure. When you have heart failure your heart is weakened and doesn't pump as well as it should. This therapy may help reduce symptoms and improve the quality of life.  Please see the handout/brochure given to you today to get more information of the different options of therapy.  Your wound check is scheduled for 12/24/14 at 3:30 pm at [address]

## 2014-11-01 NOTE — Addendum Note (Signed)
Addended by: Stanton Kidney on: 11/01/2014 05:07 PM   Modules accepted: Orders

## 2014-11-01 NOTE — Progress Notes (Signed)
ELECTROPHYSIOLOGY CONSULT NOTE  Patient ID: Roger Crosby, MRN: [id number], DOB/AGE: [birth year] 53 y.o. Admit date: (Not on file) Date of Consult: 11/01/2014  Primary Physician: No PCP Per Patient Primary Cardiologist: *CHF  Chief Complaint: ICD   HPI Roger Crosby is a 53 y.o. male referred for consideration of CRT-ICD for nonischemic cardiomyopathy   he initially presented 2014  That time his ejection fraction was 20-25%   He was noted to have left bundle branch block hypertension   Recent echocardiogram 11/15 demonstrated an EF of 25-30%.   he has dyspnea on exertion is unable to complete a flight of stairs without stopping. He has some peripheral edema. He denies orthopnea or nocturnal dyspnea.  He has some palpitations.  He relates this to the use of  extracurricular drugs.  He has had no syncope  He has been clean for two months,  He is trying  Past Medical History  Diagnosis Date  . HTN (hypertension)   . GERD (gastroesophageal reflux disease)   . LBP (low back pain)   . LBBB (left bundle branch block)     dx 2011  . Hx of echocardiogram     echo 5/11: EF 45-50%, mild HK of Ap AS wall, ap Ant wall and true apex (LAD distribution), mild LAE  . NICM (nonischemic cardiomyopathy)   . CAD (coronary artery disease)     nonobstructive by [facility] 6/11: oOM1 40%, mild luminal irregs elsewhere  . CKD (chronic kidney disease)   . Systolic heart failure       Surgical History:  Past Surgical History  Procedure Laterality Date  . Cardiac catheterization Left 9/14    with angiogram     Home Meds: Prior to Admission medications   Medication Sig Start Date End Date Taking? Authorizing Provider  aspirin EC 81 MG tablet Take 1 tablet (81 mg total) by mouth daily. 08/29/12  Yes Scott Joylene Draft, PA-C  carvedilol (COREG) 12.5 MG tablet Take 1.5 tablets (18.75 mg total) by mouth 2 (two) times daily with a meal. 10/24/14  Yes Jolaine Artist, MD  furosemide (LASIX) 20 MG tablet Take  1 tablet (20 mg total) by mouth daily. 12/14/13  Yes Rande Brunt, NP  HYDROcodone-acetaminophen (NORCO) 5-325 MG per tablet Take 2 tablets by mouth every 4 (four) hours as needed. 12/21/13  Yes Richarda Blade, MD  omeprazole (PRILOSEC) 20 MG capsule TAKE ONE CAPSULE BY MOUTH EVERY DAY 12/14/13  Yes Rande Brunt, NP  Sacubitril-Valsartan (ENTRESTO) 49-51 MG TABS Take 1 tablet by mouth 2 (two) times daily. 08/22/14  Yes Jolaine Artist, MD  spironolactone (ALDACTONE) 25 MG tablet Take 1 tablet (25 mg total) by mouth daily. 10/24/14  Yes Jolaine Artist, MD     Allergies: No Known Allergies  History   Social History  . Marital Status: Married    Spouse Name: N/A    Number of Children: N/A  . Years of Education: N/A   Occupational History  . Not on file.   Social History Main Topics  . Smoking status: Current Some Day Smoker  . Smokeless tobacco: Not on file  . Alcohol Use: No  . Drug Use: No  . Sexual Activity: Not Currently   Other Topics Concern  . Not on file   Social History Narrative     Family History  Problem Relation Age of Onset  . Heart attack Mother     in 62's  . Heart disease Mother  ischemic  . Heart attack Father     in 64's  . Heart disease Father     ischemic  . Congestive Heart Failure Mother   . Diabetes Mother   . Hypertension Mother   . Heart Problems Father   . Crohn's disease Sister   . Prostate cancer Brother   . HIV/AIDS Brother      ROS:  Please see the history of present illness.     All other systems reviewed and negative.    Physical Exam:  Blood pressure 130/74, pulse 82, height 5\' 8"  (1.727 m), weight 228 lb (103.42 kg). General: Well developed, well nourished male in no acute distress. Head: Normocephalic, atraumatic, sclera non-icteric, no xanthomas, nares are without discharge. EENT: normal Lymph Nodes:  none Back: without scoliosis/kyphosis, no CVA tendersness Neck: Negative for carotid bruits. JVD 6-8 Lungs:  Clear bilaterally to auscultation without wheezes, rales, or rhonchi. Breathing is unlabored. Heart: RRR with S1 S2. No  murmur , PMI displaced Abdomen: Soft, non-tender, non-distended with normoactive bowel sounds. No hepatomegaly. No rebound/guarding. No obvious abdominal masses. Msk:  Strength and tone appear normal for age. Extremities: No clubbing or cyanosis. No  edema.  Distal pedal pulses are 2+ and equal bilaterally. Skin: Warm and Dry Neuro: Alert and oriented X 3. CN III-XII intact Grossly normal sensory and motor function . Psych:  Responds to questions appropriately with a normal affect.      Labs: Cardiac Enzymes No results for input(s): CKTOTAL, CKMB, TROPONINI in the last 72 hours. CBC Lab Results  Component Value Date   WBC 7.3 08/21/2014   HGB 15.1 08/21/2014   HCT 45.7 08/21/2014   MCV 80.3 08/21/2014   PLT 282 08/21/2014   PROTIME: No results for input(s): LABPROT, INR in the last 72 hours. Chemistry No results for input(s): NA, K, CL, CO2, BUN, CREATININE, CALCIUM, PROT, BILITOT, ALKPHOS, ALT, AST, GLUCOSE in the last 168 hours.  Invalid input(s): LABALBU Lipids Lab Results  Component Value Date   CHOL 186 01/30/2013   HDL 38.60* 01/30/2013   LDLCALC 135* 01/30/2013   TRIG 62.0 01/30/2013   BNP PRO B NATRIURETIC PEPTIDE (BNP)  Date/Time Value Ref Range Status  10/24/2014 12:47 PM 285.6* 0 - 125 pg/mL Final  08/22/2014 11:33 AM 659.6* 0 - 125 pg/mL Final  12/21/2013 04:06 PM 406.2* 0 - 125 pg/mL Final  12/14/2013 02:08 PM 445.3* 0 - 125 pg/mL Final   Miscellaneous No results found for: DDIMER  Radiology/Studies:  No results found.  EKG:  Sinus rhythm at 82 Intervals 15/15/42   Assessment and Plan:  NICM  LBBB  CHF systolic  Substance abuse  Sleep disordered breathing  The patient has class III heart failure in the setting of nonischemic cardiomyopathy persistent left ventricular dysfunction and left bundle branch block. He is on  guideline directed medical therapy and as such is appropriately a candidate for CRT-D implantation. We have discussed potential benefits related to sudden death prevention in the potential benefits related to heart failure symptom reduction. Have reviewed the potential benefits and risks of ICD implantation including but not limited to death, perforation of heart or lung, lead dislodgement, infection,  device malfunction and inappropriate shocks.  The patient and familyexpress understanding  and are willing to proceed.     he also has symptoms consistent with obstructive sleep apnea with sleep disordered breathing and daytime somnolence. In the past he has been disinclined to pursue sleep study and therapy; he is much more bendable  to it today. We will proceed   we have also discussed the importance of continuing to stay clean from substance abuse    Virl Axe

## 2014-11-08 ENCOUNTER — Encounter (HOSPITAL_COMMUNITY): Payer: Self-pay | Admitting: Cardiology

## 2014-11-08 NOTE — Addendum Note (Signed)
Addended by: Paulla Dolly on: 11/08/2014 08:58 AM   Modules accepted: Orders

## 2014-11-21 ENCOUNTER — Other Ambulatory Visit: Payer: Self-pay | Admitting: *Deleted

## 2014-11-21 ENCOUNTER — Telehealth: Payer: Self-pay | Admitting: *Deleted

## 2014-11-21 DIAGNOSIS — R400 Somnolence: Secondary | ICD-10-CM

## 2014-11-21 DIAGNOSIS — G4730 Sleep apnea, unspecified: Secondary | ICD-10-CM

## 2014-11-21 NOTE — Telephone Encounter (Signed)
Advised that CRT-D on 12/14/14 has been rescheduled to 10:30 a.m.Roger Crosby at hospital at 8:30 a.m. Patient verbalized understanding and agreeable to plan.

## 2014-12-07 ENCOUNTER — Other Ambulatory Visit: Payer: Medicaid Other

## 2014-12-10 ENCOUNTER — Other Ambulatory Visit (INDEPENDENT_AMBULATORY_CARE_PROVIDER_SITE_OTHER): Payer: Medicaid Other | Admitting: *Deleted

## 2014-12-10 DIAGNOSIS — Z01812 Encounter for preprocedural laboratory examination: Secondary | ICD-10-CM

## 2014-12-10 DIAGNOSIS — I5022 Chronic systolic (congestive) heart failure: Secondary | ICD-10-CM

## 2014-12-10 DIAGNOSIS — I447 Left bundle-branch block, unspecified: Secondary | ICD-10-CM

## 2014-12-10 LAB — BASIC METABOLIC PANEL
BUN: 15 mg/dL (ref 6–23)
CO2: 26 mEq/L (ref 19–32)
Calcium: 8.8 mg/dL (ref 8.4–10.5)
Chloride: 110 mEq/L (ref 96–112)
Creatinine, Ser: 1.3 mg/dL (ref 0.4–1.5)
GFR: 72.75 mL/min (ref >60.00)
Glucose, Bld: 94 mg/dL (ref 70–99)
Potassium: 4 mEq/L (ref 3.5–5.1)
Sodium: 141 mEq/L (ref 135–145)

## 2014-12-10 LAB — CBC WITH DIFFERENTIAL/PLATELET
BASOS PCT: 0.6 % (ref 0.0–3.0)
Basophils Absolute: 0 10*3/uL (ref 0.0–0.1)
Eosinophils Absolute: 0.3 10*3/uL (ref 0.0–0.7)
Eosinophils Relative: 3.7 % (ref 0.0–5.0)
HEMATOCRIT: 43.5 % (ref 39.0–52.0)
HEMOGLOBIN: 13.9 g/dL (ref 13.0–17.0)
LYMPHS PCT: 35.8 % (ref 12.0–46.0)
Lymphs Abs: 2.5 10*3/uL (ref 0.7–4.0)
MCHC: 32 g/dL (ref 30.0–36.0)
MCV: 79.6 fl (ref 78.0–100.0)
MONO ABS: 0.8 10*3/uL (ref 0.1–1.0)
Monocytes Relative: 12 % (ref 3.0–12.0)
NEUTROS PCT: 47.9 % (ref 43.0–77.0)
Neutro Abs: 3.3 10*3/uL (ref 1.4–7.7)
Platelets: 291 10*3/uL (ref 150.0–400.0)
RBC: 5.47 Mil/uL (ref 4.22–5.81)
RDW: 15.4 % (ref 11.5–15.5)
WBC: 6.9 10*3/uL (ref 4.0–10.5)

## 2014-12-13 DIAGNOSIS — I129 Hypertensive chronic kidney disease with stage 1 through stage 4 chronic kidney disease, or unspecified chronic kidney disease: Secondary | ICD-10-CM | POA: Diagnosis not present

## 2014-12-13 DIAGNOSIS — Z7982 Long term (current) use of aspirin: Secondary | ICD-10-CM | POA: Diagnosis not present

## 2014-12-13 DIAGNOSIS — Z79891 Long term (current) use of opiate analgesic: Secondary | ICD-10-CM | POA: Diagnosis not present

## 2014-12-13 DIAGNOSIS — I5022 Chronic systolic (congestive) heart failure: Secondary | ICD-10-CM | POA: Diagnosis not present

## 2014-12-13 DIAGNOSIS — K219 Gastro-esophageal reflux disease without esophagitis: Secondary | ICD-10-CM | POA: Diagnosis not present

## 2014-12-13 DIAGNOSIS — N189 Chronic kidney disease, unspecified: Secondary | ICD-10-CM | POA: Diagnosis not present

## 2014-12-13 DIAGNOSIS — Z79899 Other long term (current) drug therapy: Secondary | ICD-10-CM | POA: Diagnosis not present

## 2014-12-13 DIAGNOSIS — G4733 Obstructive sleep apnea (adult) (pediatric): Secondary | ICD-10-CM | POA: Diagnosis not present

## 2014-12-13 DIAGNOSIS — I429 Cardiomyopathy, unspecified: Secondary | ICD-10-CM | POA: Diagnosis not present

## 2014-12-13 DIAGNOSIS — I447 Left bundle-branch block, unspecified: Secondary | ICD-10-CM | POA: Diagnosis not present

## 2014-12-13 DIAGNOSIS — I2510 Atherosclerotic heart disease of native coronary artery without angina pectoris: Secondary | ICD-10-CM | POA: Diagnosis not present

## 2014-12-13 DIAGNOSIS — E780 Pure hypercholesterolemia: Secondary | ICD-10-CM | POA: Diagnosis not present

## 2014-12-13 MED ORDER — CEFAZOLIN SODIUM-DEXTROSE 2-3 GM-% IV SOLR: 2.0000 g | INTRAVENOUS | Status: DC

## 2014-12-13 MED ORDER — MUPIROCIN 2 % EX OINT
1.0000 "application " | TOPICAL_OINTMENT | Freq: Once | CUTANEOUS | Status: AC
  Administered 2014-12-14: 1 via TOPICAL
  Filled 2014-12-13: qty 22

## 2014-12-13 MED ORDER — SODIUM CHLORIDE 0.9 % IV SOLN
INTRAVENOUS | Status: DC
  Administered 2014-12-14: 10:00:00 via INTRAVENOUS

## 2014-12-13 MED ORDER — SODIUM CHLORIDE 0.9 % IR SOLN
80.0000 mg | Status: DC
  Filled 2014-12-13: qty 2

## 2014-12-14 ENCOUNTER — Encounter (HOSPITAL_COMMUNITY)
Admission: RE | Disposition: A | Discharge status: Home / Self Care | Payer: Self-pay | Source: Ambulatory Visit | Attending: Internal Medicine

## 2014-12-14 ENCOUNTER — Encounter (HOSPITAL_COMMUNITY): Payer: Self-pay | Admitting: General Practice

## 2014-12-14 ENCOUNTER — Ambulatory Visit (HOSPITAL_COMMUNITY)
Admission: RE | Admit: 2014-12-14 | Discharge: 2014-12-15 | Disposition: A | Discharge status: Home / Self Care | Payer: Medicaid Other | Source: Ambulatory Visit | Attending: Internal Medicine | Admitting: Internal Medicine

## 2014-12-14 DIAGNOSIS — Z959 Presence of cardiac and vascular implant and graft, unspecified: Secondary | ICD-10-CM

## 2014-12-14 DIAGNOSIS — Z7982 Long term (current) use of aspirin: Secondary | ICD-10-CM | POA: Insufficient documentation

## 2014-12-14 DIAGNOSIS — I5022 Chronic systolic (congestive) heart failure: Secondary | ICD-10-CM | POA: Diagnosis not present

## 2014-12-14 DIAGNOSIS — K219 Gastro-esophageal reflux disease without esophagitis: Secondary | ICD-10-CM | POA: Insufficient documentation

## 2014-12-14 DIAGNOSIS — I447 Left bundle-branch block, unspecified: Secondary | ICD-10-CM | POA: Diagnosis not present

## 2014-12-14 DIAGNOSIS — E780 Pure hypercholesterolemia: Secondary | ICD-10-CM | POA: Insufficient documentation

## 2014-12-14 DIAGNOSIS — Z79891 Long term (current) use of opiate analgesic: Secondary | ICD-10-CM | POA: Insufficient documentation

## 2014-12-14 DIAGNOSIS — I2510 Atherosclerotic heart disease of native coronary artery without angina pectoris: Secondary | ICD-10-CM | POA: Insufficient documentation

## 2014-12-14 DIAGNOSIS — N189 Chronic kidney disease, unspecified: Secondary | ICD-10-CM | POA: Insufficient documentation

## 2014-12-14 DIAGNOSIS — I429 Cardiomyopathy, unspecified: Secondary | ICD-10-CM

## 2014-12-14 DIAGNOSIS — G4733 Obstructive sleep apnea (adult) (pediatric): Secondary | ICD-10-CM | POA: Insufficient documentation

## 2014-12-14 DIAGNOSIS — I129 Hypertensive chronic kidney disease with stage 1 through stage 4 chronic kidney disease, or unspecified chronic kidney disease: Secondary | ICD-10-CM | POA: Insufficient documentation

## 2014-12-14 DIAGNOSIS — I428 Other cardiomyopathies: Secondary | ICD-10-CM

## 2014-12-14 DIAGNOSIS — I10 Essential (primary) hypertension: Secondary | ICD-10-CM | POA: Diagnosis present

## 2014-12-14 DIAGNOSIS — Z79899 Other long term (current) drug therapy: Secondary | ICD-10-CM | POA: Insufficient documentation

## 2014-12-14 HISTORY — DX: Presence of automatic (implantable) cardiac defibrillator: Z95.810

## 2014-12-14 HISTORY — DX: Heart failure, unspecified: I50.9

## 2014-12-14 HISTORY — PX: BI-VENTRICULAR IMPLANTABLE CARDIOVERTER DEFIBRILLATOR: SHX5459

## 2014-12-14 HISTORY — DX: Pure hypercholesterolemia, unspecified: E78.00

## 2014-12-14 LAB — SURGICAL PCR SCREEN
MRSA, PCR: NEGATIVE
Staphylococcus aureus: NEGATIVE

## 2014-12-14 SURGERY — BI-VENTRICULAR IMPLANTABLE CARDIOVERTER DEFIBRILLATOR  (CRT-D)
Anesthesia: LOCAL

## 2014-12-14 MED ORDER — MIDAZOLAM HCL 5 MG/5ML IJ SOLN
INTRAMUSCULAR | Status: AC
  Filled 2014-12-14: qty 5

## 2014-12-14 MED ORDER — HEPARIN (PORCINE) IN NACL 2-0.9 UNIT/ML-% IJ SOLN
INTRAMUSCULAR | Status: AC
  Filled 2014-12-14: qty 500

## 2014-12-14 MED ORDER — CARVEDILOL 6.25 MG PO TABS
18.7500 mg | ORAL_TABLET | Freq: Two times a day (BID) | ORAL | Status: DC
  Administered 2014-12-14 – 2014-12-15 (×3): 18.75 mg via ORAL
  Filled 2014-12-14 (×4): qty 1

## 2014-12-14 MED ORDER — PANTOPRAZOLE SODIUM 40 MG PO TBEC
40.0000 mg | DELAYED_RELEASE_TABLET | Freq: Every day | ORAL | Status: DC
  Administered 2014-12-14 – 2014-12-15 (×2): 40 mg via ORAL
  Filled 2014-12-14 (×3): qty 1

## 2014-12-14 MED ORDER — ONDANSETRON HCL 4 MG/2ML IJ SOLN
4.0000 mg | Freq: Four times a day (QID) | INTRAMUSCULAR | Status: DC | PRN
  Administered 2014-12-15: 4 mg via INTRAVENOUS
  Filled 2014-12-14: qty 2

## 2014-12-14 MED ORDER — CEFAZOLIN SODIUM 1-5 GM-% IV SOLN
1.0000 g | Freq: Four times a day (QID) | INTRAVENOUS | Status: AC
  Administered 2014-12-14 – 2014-12-15 (×3): 1 g via INTRAVENOUS
  Filled 2014-12-14 (×3): qty 50

## 2014-12-14 MED ORDER — FUROSEMIDE 20 MG PO TABS
20.0000 mg | ORAL_TABLET | Freq: Every day | ORAL | Status: DC
  Administered 2014-12-14 – 2014-12-15 (×2): 20 mg via ORAL
  Filled 2014-12-14 (×2): qty 1

## 2014-12-14 MED ORDER — SACUBITRIL-VALSARTAN 49-51 MG PO TABS
1.0000 | ORAL_TABLET | Freq: Two times a day (BID) | ORAL | Status: DC
  Administered 2014-12-14 – 2014-12-15 (×2): 1 via ORAL
  Filled 2014-12-14 (×5): qty 1

## 2014-12-14 MED ORDER — LIDOCAINE HCL (PF) 1 % IJ SOLN
INTRAMUSCULAR | Status: AC
  Filled 2014-12-14: qty 30

## 2014-12-14 MED ORDER — FENTANYL CITRATE 0.05 MG/ML IJ SOLN
INTRAMUSCULAR | Status: AC
  Filled 2014-12-14: qty 2

## 2014-12-14 MED ORDER — ASPIRIN EC 81 MG PO TBEC
81.0000 mg | DELAYED_RELEASE_TABLET | Freq: Every day | ORAL | Status: DC
  Administered 2014-12-14 – 2014-12-15 (×2): 81 mg via ORAL
  Filled 2014-12-14 (×2): qty 1

## 2014-12-14 MED ORDER — HYDROCODONE-ACETAMINOPHEN 5-325 MG PO TABS
2.0000 | ORAL_TABLET | ORAL | Status: DC | PRN
  Administered 2014-12-14 – 2014-12-15 (×4): 2 via ORAL
  Filled 2014-12-14 (×5): qty 2

## 2014-12-14 MED ORDER — LIDOCAINE HCL (PF) 1 % IJ SOLN
INTRAMUSCULAR | Status: AC
  Filled 2014-12-14: qty 60

## 2014-12-14 MED ORDER — ACETAMINOPHEN 325 MG PO TABS: 325.0000 mg | ORAL_TABLET | ORAL | Status: DC | PRN

## 2014-12-14 MED ORDER — MUPIROCIN 2 % EX OINT
TOPICAL_OINTMENT | CUTANEOUS | Status: AC
  Filled 2014-12-14: qty 22

## 2014-12-14 MED ORDER — CHLORHEXIDINE GLUCONATE 4 % EX LIQD
60.0000 mL | Freq: Once | CUTANEOUS | Status: DC
  Filled 2014-12-14: qty 60

## 2014-12-14 MED ORDER — SODIUM CHLORIDE 0.9 % IV SOLN: INTRAVENOUS | Status: AC

## 2014-12-14 MED ORDER — SPIRONOLACTONE 25 MG PO TABS
25.0000 mg | ORAL_TABLET | Freq: Every day | ORAL | Status: DC
  Administered 2014-12-14 – 2014-12-15 (×2): 25 mg via ORAL
  Filled 2014-12-14 (×2): qty 1

## 2014-12-14 NOTE — Interval H&P Note (Signed)
ICD Criteria  Current LVEF:25% ;Obtained > or = 1 month ago and < or = 3 months ago.  NYHA Functional Classification: Class III  Heart Failure History:  Yes, Duration of heart failure since onset is > 9 months  Non-Ischemic Dilated Cardiomyopathy History:  Yes, timeframe is > 9 months  Atrial Fibrillation/Atrial Flutter:  No.  Ventricular Tachycardia History:  No.  Cardiac Arrest History:  No  History of Syndromes with Risk of Sudden Death:  No.  Previous ICD:  No.  Electrophysiology Study: No.  Prior MI: No.  PPM: No.  OSA:  Yes  Patient Life Expectancy of >=1 year: Yes.  Anticoagulation Therapy:  Patient is NOT on anticoagulation therapy.   Beta Blocker Therapy:  Yes.   Ace Inhibitor/ARB Therapy:  Yes.History and Physical Interval Note:  12/14/2014 8:15 AM  Roger Crosby  has presented today for surgery, with the diagnosis of chronic systolic heart failure  The various methods of treatment have been discussed with the patient and family. After consideration of risks, benefits and other options for treatment, the patient has consented to  Procedure(s): Crosby-VENTRICULAR IMPLANTABLE CARDIOVERTER DEFIBRILLATOR  (CRT-D) (N/A) as a surgical intervention .  The patient's history has been reviewed, patient examined, no change in status, stable for surgery.  I have reviewed the patient's chart and labs.  Questions were answered to the patient's satisfaction.     Virl Axe

## 2014-12-14 NOTE — H&P (Signed)
Patient Care Team: No Pcp Per Patient as PCP - General (General Practice)   HPI  Roger Crosby is a 54 y.o. male Admitted for  CRT-ICD for nonischemic cardiomyopathy with LBBB He  initially presented 2014 At that time his ejection fraction was 20-25% He was noted to have left bundle branch block and  hypertension  Recent echocardiogram 11/15 demonstrated an EF of 25-30%.  He  has dyspnea on exertion is unable to complete a flight of stairs without stopping. He has some peripheral edema. He denies orthopnea or nocturnal dyspnea.     Class III CHF Past Medical History  Diagnosis Date  . HTN (hypertension)   . GERD (gastroesophageal reflux disease)   . LBP (low back pain)   . LBBB (left bundle branch block)     dx 2011  . Hx of echocardiogram     echo 5/11: EF 45-50%, mild HK of Ap AS wall, ap Ant wall and true apex (LAD distribution), mild LAE  . NICM (nonischemic cardiomyopathy)   . CAD (coronary artery disease)     nonobstructive by [facility] 6/11: oOM1 40%, mild luminal irregs elsewhere  . CKD (chronic kidney disease)   . Systolic heart failure     Past Surgical History  Procedure Laterality Date  . Cardiac catheterization Left 9/14    with angiogram  . Left heart catheterization with coronary angiogram N/A 08/04/2013    Procedure: LEFT HEART CATHETERIZATION WITH CORONARY ANGIOGRAM;  Surgeon: Larey Dresser, MD;  Location: [facility] CATH LAB;  Service: Cardiovascular;  Laterality: N/A;    Current Facility-Administered Medications  Medication Dose Route Frequency Provider Last Rate Last Dose  . 0.9 %  sodium chloride infusion   Intravenous Continuous Deboraha Sprang, MD      . 0.9 %  sodium chloride infusion   Intravenous Continuous Deboraha Sprang, MD      . ceFAZolin (ANCEF) IVPB 2 g/50 mL premix  2 g Intravenous On Call Deboraha Sprang, MD      . gentamicin (GARAMYCIN) 80 mg in sodium chloride irrigation 0.9 % 500 mL irrigation  80 mg Irrigation On Call Deboraha Sprang,  MD      . mupirocin ointment (BACTROBAN) 2 % 1 application  1 application Topical Once Deboraha Sprang, MD         Medication List    ASK your doctor about these medications        aspirin EC 81 MG tablet  Take 1 tablet (81 mg total) by mouth daily.     carvedilol 12.5 MG tablet  Commonly known as:  COREG  Take 1.5 tablets (18.75 mg total) by mouth 2 (two) times daily with a meal.     furosemide 20 MG tablet  Commonly known as:  LASIX  Take 1 tablet (20 mg total) by mouth daily.     HYDROcodone-acetaminophen 5-325 MG per tablet  Commonly known as:  NORCO  Take 2 tablets by mouth every 4 (four) hours as needed.     omeprazole 20 MG capsule  Commonly known as:  PRILOSEC  TAKE ONE CAPSULE BY MOUTH EVERY DAY     sacubitril-valsartan 49-51 MG  Commonly known as:  ENTRESTO  Take 1 tablet by mouth 2 (two) times daily.     spironolactone 25 MG tablet  Commonly known as:  ALDACTONE  Take 1 tablet (25 mg total) by mouth daily.         No Known Allergies  Review of Systems negative except from HPI and PMH  Physical Exam BP 152/88 mmHg  Pulse 82  Temp(Src) 98.3 F (36.8 C) (Oral)  Resp 18  Ht 5\' 8"  (1.727 m)  Wt 230 lb (104.327 kg)  BMI 34.98 kg/m2  SpO2 98% Well developed and well nourished in no acute distress HENT normal E scleral and icterus clear Neck Supple JVP flat; carotids brisk and full Clear to ausculation  Regular rate and rhythm, no murmurs gallops or rub Soft with active bowel sounds No clubbing cyanosis none Edema Alert and oriented, grossly normal motor and sensory function Skin Warm and Dry    Assessment and  Plan   NICM  LBBB  CHF systolic  Substance abuse  Sleep disordered breathing  The patient has class III heart failure in the setting of nonischemic cardiomyopathy persistent left ventricular dysfunction and left bundle branch block. He is on guideline directed medical therapy and as such is appropriately a candidate for CRT-D  implantation. We have discussed potential benefits related to sudden death prevention in the potential benefits related to heart failure symptom reduction. Have reviewed the potential benefits and risks of ICD implantation including but not limited to death, perforation of heart or lung, lead dislodgement, infection, device malfunction and inappropriate shocks. The patient and familyexpress understanding and are willing to proceed.   he also has symptoms consistent with obstructive sleep apnea with sleep disordered breathing and daytime somnolence. In the past he has been disinclined to pursue sleep study and therapy; he is much more amenable to it today. Scheduled for march    >

## 2014-12-14 NOTE — CV Procedure (Signed)
...........  Roger Crosby [id number]  [id number]  Preop OI:NOMV CHF LBBB Postop Dx same/   Procedure:dusal chamber ICD implanation with failed LV lead placeemnt  Cx: None   EBL: Minimal     Dictation number [id number]  Virl Axe, MD 12/14/2014 2:21 PM

## 2014-12-14 NOTE — Progress Notes (Signed)
Orthopedic Tech Progress Note Patient Details:  Roger Crosby 06/08/1961 [id number] Patient already has arm sling on. Patient ID: Damiean Lukes, male   DOB: Jun 23, 1961, 54 y.o.   MRN: [id number]   Braulio Bosch 12/14/2014, 3:05 PM

## 2014-12-15 ENCOUNTER — Encounter (HOSPITAL_COMMUNITY): Payer: Self-pay | Admitting: Physician Assistant

## 2014-12-15 ENCOUNTER — Ambulatory Visit (HOSPITAL_COMMUNITY): Payer: Medicaid Other

## 2014-12-15 DIAGNOSIS — I428 Other cardiomyopathies: Secondary | ICD-10-CM

## 2014-12-15 DIAGNOSIS — I429 Cardiomyopathy, unspecified: Secondary | ICD-10-CM | POA: Diagnosis not present

## 2014-12-15 DIAGNOSIS — I5022 Chronic systolic (congestive) heart failure: Secondary | ICD-10-CM | POA: Diagnosis not present

## 2014-12-15 DIAGNOSIS — I447 Left bundle-branch block, unspecified: Secondary | ICD-10-CM | POA: Diagnosis not present

## 2014-12-15 DIAGNOSIS — K219 Gastro-esophageal reflux disease without esophagitis: Secondary | ICD-10-CM | POA: Diagnosis not present

## 2014-12-15 MED ORDER — HYDROCODONE-ACETAMINOPHEN 5-325 MG PO TABS: 2.0000 | ORAL_TABLET | Freq: Four times a day (QID) | ORAL | Status: DC | PRN

## 2014-12-15 NOTE — Discharge Summary (Signed)
Discharge Summary   Patient ID: Roger Crosby, MRN: [id number], DOB/AGE: [birth year] 54 y.o.  Admit date: 12/14/2014 Discharge date: 12/15/2014   Primary Care Physician:  No PCP Per Patient   Primary Cardiologist:  Dr. [name]  Primary Electrophysiologist:  Dr. Virl Axe   Reason for Admission:  CRT-ICD implantation   Primary Discharge Diagnoses:  Principal Problem:   NICM (nonischemic cardiomyopathy) Active Problems:   Benign essential HTN   LBBB (left bundle branch block)   Chronic systolic heart failure     Wt Readings from Last 3 Encounters:  12/15/14 225 lb 11.2 oz (102.377 kg)  11/01/14 228 lb (103.42 kg)  08/22/14 225 lb 8 oz (102.286 kg)    Secondary Discharge Diagnoses:   Past Medical History  Diagnosis Date  . HTN (hypertension)   . GERD (gastroesophageal reflux disease)   . LBP (low back pain)   . LBBB (left bundle branch block)     dx 2011  . Hx of echocardiogram     echo 5/11: EF 45-50%, mild HK of Ap AS wall, ap Ant wall and true apex (LAD distribution), mild LAE  . NICM (nonischemic cardiomyopathy)   . CAD (coronary artery disease)     nonobstructive by [facility] 6/11: oOM1 40%, mild luminal irregs elsewhere  . Systolic heart failure   . AICD (automatic cardioverter/defibrillator) present     a.  Unsuccessful LV lead placement 11/2014  . High cholesterol   . CHF (congestive heart failure)   . Sleep apnea   . CKD (chronic kidney disease)       Allergies:   No Known Allergies    Procedures Performed This Admission:   Procedure:dual chamber ICD implanation with failed LV lead placeemnt 12/14/14   Hospital Course:  Roger Crosby is a 54 y.o. male with a history of LBBB, NICM with an EF of 20-25%, HTN, CKD.  Cardiac catheterization in 07/2013 demonstrated no significant CAD.  Recent echocardiogram in November 2015 demonstrated continued reduced LV function with an EF of 25-30%. He was referred to EP for consideration of CRT-ICD. He saw Dr.  Caryl Comes.  It was felt that he met indications for CRT-ICD implantation. He was admitted 12/14/14. He underwent implantation of a dual-chamber ICD with unsuccessful LV lead placement. He had no immediate complications. He was seen by Dr. Caryl Comes this morning. He was noted to be without complaint. He was felt stable for discharge to home with appropriate EP follow-up. Dr. Caryl Comes plan to review with Dr. Aundra Dubin regarding whether or not to consider referral for epicardial LV lead placement. He was noted to have significant pain from his implantation site. Dr. Caryl Comes recommended pain medications be prescribed at discharge.   Discharge Vitals:   Blood pressure 133/79, pulse 83, temperature 98 F (36.7 C), temperature source Oral, resp. rate 16, height 5' 8" (1.727 m), weight 225 lb 11.2 oz (102.377 kg), SpO2 96 %.   Labs:    Diagnostic Procedures and Studies:  Dg Chest 2 View  12/15/2014   CLINICAL DATA:  Chronic systolic heart failure. Pacemaker placement.  EXAM: CHEST  2 VIEW  COMPARISON:  08/21/2014  FINDINGS: Dual lead transvenous pacemaker is seen in appropriate position. No pneumothorax identified.  Decreased lung volumes noted. Both lungs remain clear. Heart size is stable.  IMPRESSION: New transvenous pacemaker in appropriate position. No evidence of pneumothorax or other acute findings.   Electronically Signed   By: Earle Gell M.D.   On: 12/15/2014 09:06    Disposition:  Pt is being discharged home today in good condition.  Follow-up Plans & Appointments      Follow-up Information    Follow up with [facility] In 10 days.   Why:  You will have a follow-up appointment with the device clinic for a wound check. The office will contact you with an appointment., For wound re-check   Contact information:   [place] [address] [phone]       Follow up with Virl Axe, MD In 3 months.   Specialty:  Cardiology   Why:  The office will contact you with an  appointment   Contact information:   [address] [place] [address] [phone]       Follow up with [name], MD.   Specialty:  Cardiology   Why:  As planned   Contact information:   [address] [place] [address] [phone]       Discharge Medications    Medication List    TAKE these medications        aspirin EC 81 MG tablet  Take 1 tablet (81 mg total) by mouth daily.     carvedilol 12.5 MG tablet  Commonly known as:  COREG  Take 1.5 tablets (18.75 mg total) by mouth 2 (two) times daily with a meal.     furosemide 20 MG tablet  Commonly known as:  LASIX  Take 1 tablet (20 mg total) by mouth daily.     HYDROcodone-acetaminophen 5-325 MG per tablet  Commonly known as:  NORCO  Take 2 tablets by mouth every 6 (six) hours as needed for severe pain.     omeprazole 20 MG capsule  Commonly known as:  PRILOSEC  TAKE ONE CAPSULE BY MOUTH EVERY DAY     sacubitril-valsartan 49-51 MG  Commonly known as:  ENTRESTO  Take 1 tablet by mouth 2 (two) times daily.     spironolactone 25 MG tablet  Commonly known as:  ALDACTONE  Take 1 tablet (25 mg total) by mouth daily.         Outstanding Labs/Studies  1. None   Duration of Discharge Encounter: Greater than 30 minutes including physician and PA time.  Signed, Richardson Dopp, PA-C   12/15/2014 3:50 PM

## 2014-12-15 NOTE — Progress Notes (Signed)
       Patient Name: Roger Crosby      SUBJECTIVE:wtihout chest pain or short ness of breath some soreness aat site  Past Medical History  Diagnosis Date  . HTN (hypertension)   . GERD (gastroesophageal reflux disease)   . LBP (low back pain)   . LBBB (left bundle branch block)     dx 2011  . Hx of echocardiogram     echo 5/11: EF 45-50%, mild HK of Ap AS wall, ap Ant wall and true apex (LAD distribution), mild LAE  . NICM (nonischemic cardiomyopathy)   . CAD (coronary artery disease)     nonobstructive by [facility] 6/11: oOM1 40%, mild luminal irregs elsewhere  . Systolic heart failure   . AICD (automatic cardioverter/defibrillator) present   . High cholesterol   . CHF (congestive heart failure)   . Sleep apnea   . CKD (chronic kidney disease)     Scheduled Meds:  Scheduled Meds: . aspirin EC  81 mg Oral Daily  . carvedilol  18.75 mg Oral BID WC  . furosemide  20 mg Oral Daily  . pantoprazole  40 mg Oral Daily  . sacubitril-valsartan  1 tablet Oral BID  . spironolactone  25 mg Oral Daily   Continuous Infusions:  acetaminophen, HYDROcodone-acetaminophen, ondansetron (ZOFRAN) IV    PHYSICAL EXAM Filed Vitals:   12/14/14 1445 12/14/14 2024 12/15/14 0109 12/15/14 0538  BP: 141/88 139/76 130/76 133/81  Pulse: 83  82 78  Temp: 97.8 F (36.6 C) 98.3 F (36.8 C) 98.4 F (36.9 C) 98 F (36.7 C)  TempSrc: Oral  Oral Oral  Resp: 20 18 18    Height: 5\' 8"  (1.727 m)     Weight: 218 lb 7.6 oz (99.1 kg)   225 lb 11.2 oz (102.377 kg)  SpO2: 98% 95% 95% 95%   Well developed and nourished in no acute distress HENT normal Neck supple with JVP-flat Clear Pocket without hematoma Regular rate and rhythm, no murmurs or gallops Abd-soft with active BS No Clubbing cyanosis edema Skin-warm and dry A & Oriented  Grossly normal sensory and motor function  TELEMETRY: Reviewed telemetry pt in NSR  CXR stable lead placement   Intake/Output Summary (Last 24 hours) at 12/15/14  0917 Last data filed at 12/15/14 0858  Gross per 24 hour  Intake    790 ml  Output    975 ml  Net   -185 ml    LABS: Basic Metabolic Panel:  Recent Labs Lab 12/10/14 0931  NA 141  K 4.0  CL 110  CO2 26  GLUCOSE 94  BUN 15  CREATININE 1.3  CALCIUM 8.8   Cardiac Enzymes: No results for input(s): CKTOTAL, CKMB, CKMBINDEX, TROPONINI in the last 72 hours. CBC:  Recent Labs Lab 12/10/14 0931  WBC 6.9  NEUTROABS 3.3  HGB 13.9  HCT 43.5  MCV 79.6  PLT 291.0    BNP (last 3 results)  Recent Labs  12/21/13 1606 08/22/14 1133 10/24/14 1247  PROBNP 406.2* 659.6* 285.6*   D-Dimer:   Device Interrogation: normal device function   ASSESSMENT AND PLAN:  Active Problems:   Chronic systolic heart failure  Failed CRT placement yesterday with very tortuous CS allowing wires but not deployement of the sheath  Will review withDM  And anticpate referral for epicardial placement   Will need pain meds for discharge  Signed, Virl Axe MD  12/15/2014

## 2014-12-15 NOTE — Discharge Instructions (Signed)
° ° °  Supplemental Discharge Instructions for  Defibrillator Patients  Activity No heavy lifting or vigorous activity with your left arm for 6 to 8 weeks.  Do not raise your left arm above your head for one week.  Gradually raise your affected arm as drawn below.          1/22                           1/23                        1/24                         1/25       NO DRIVING for 2 weeks     ; you may begin driving on    3/88/71      . WOUND CARE - Keep the wound area clean and dry.  Do not get this area wet for one week. No showers for one week; you may shower on  1/23            . - The tape/steri-strips on your wound will fall off; do not pull them off.  No bandage is needed on the site.  DO  NOT apply any creams, oils, or ointments to the wound area. - If you notice any drainage or discharge from the wound, any swelling or bruising at the site, or you develop a fever > 101? F after you are discharged home, call the office at once.  Special Instructions - You are still able to use cellular telephones; use the ear opposite the side where you have your pacemaker/defibrillator.  Avoid carrying your cellular phone near your device. - When traveling through airports, show security personnel your identification card to avoid being screened in the metal detectors.  Ask the security personnel to use the hand wand. - Avoid arc welding equipment, MRI testing (magnetic resonance imaging), TENS units (transcutaneous nerve stimulators).  Call the office for questions about other devices. - Avoid electrical appliances that are in poor condition or are not properly grounded. - Microwave ovens are safe to be near or to operate.  Additional information for defibrillator patients should your device go off: - If your device goes off ONCE and you feel fine afterward, notify the device clinic nurses. - If your device goes off ONCE and you do not feel well afterward, call 911. - If your device goes off  TWICE, call 911. - If your device goes off THREE times in one day, call 911.  DO NOT DRIVE YOURSELF OR A FAMILY MEMBER WITH A DEFIBRILLATOR TO THE HOSPITAL--CALL 911.

## 2014-12-16 NOTE — Op Note (Signed)
NAMEEITHAN, BEAGLE NO.:  [id number]  MEDICAL RECORD NO.:  [id number]  LOCATION:                                 FACILITY:  PHYSICIAN:  Deboraha Sprang, MD, [facility]   DATE OF BIRTH:  DATE OF PROCEDURE:  12/14/2014 DATE OF DISCHARGE:                              OPERATIVE REPORT   PREOPERATIVE DIAGNOSIS:  Nonischemic cardiomyopathy, congestive heart failure, left bundle-branch block.  POSTOPERATIVE DIAGNOSIS:  Nonischemic cardiomyopathy, congestive heart failure, left bundle-branch block.  PROCEDURES:  Dual-chamber defibrillator implantation with failed LV lead placement.  SURGEON:  Deboraha Sprang, MD, [facility].  OPERATIVE DESCRIPTION:  Following obtaining informed consent, the patient was brought to electrophysiology laboratory, placed on the fluoroscopic table in supine position.  After routine prep and drape of the left upper chest, lidocaine was infiltrated in prepectoral subclavicular region.  Incision was made and carried down to the prepectoral fascia using electrocautery and sharp dissection.  A pocket was formed similarly.  Hemostasis was obtained.  Thereafter attention was turned to gain access to the extrathoracic left subclavian vein which was accomplished without difficulty without the aspiration or puncture of the artery.  Three separate venipunctures were accomplished and sequentially 9-French, 9.5-French, and sheaths were placed through which we passed a Medtronic 6935, 62 cm active fixation single coil defibrillator lead, serial number [id number], was manipulated to the RV apex with bipolar R-wave was 20 with a pace impedance of 760, threshold 0.7 V at 0.5 milliseconds.  Current threshold was 1.0 mA.  There was no diaphragmatic pacing at 10 V.  The current of injury was brisk.  This lead was secured to the prepectoral fascia.  We then turned our attention to gain access to the coronary sinus.  This turned out to be incredibly difficult.  We  used 4 or 5 different sheaths, finally we used a double turned 360 sheath from Medtronic that allowed [place] to gain access to the coronary sinus.  The shape of the sheath failed to allow to be delivered on its own, so we then used triple wooly wire to try to gain support.  I was still unable to deploy the sheath.  We then used a Medtronic Attain II Micro Sub-selection system.  The dilator passed easily.  The 90 degree sheath had a smallest angle, was unable to pass.  We then attempted to place 3 wires again with the plan of inserting a different shaped coronary sinus sheath that might allow deployment into the coronary sinus.  Unfortunately, with this maneuver, we lost access to the coronary sinus; and after 45 minutes of fluoroscopy, I elected to terminate efforts to place LV lead.  We then placed a right atrial lead in the atrial appendage with the bipolar P-wave was 2.8 with a pace impedance of 618 and threshold was 1.8 V at 0.5 milliseconds immediately after lead deployment.  This lead was a Medtronic 5076, 52 cm lead, serial number [id number].  The leads were then attached to a St. Jude device with the LV bipolar port plugged, this was Unify Assura device serial [id number].  The pocket was copiously irrigated with antibiotic containing saline solution. Hemostasis was assured.  Leads and  pulse generator were placed in the pocket and secured to the prepectoral fascia.  The wound was then closed in 3 layers in normal fashion with Surgicel placed along the cephalad and superior lateral aspect of the pocket.  Through the device, bipolar P-wave was 2.1 with a pace impedance of 450, threshold 1 V at 0.5.  The RV amplitude was greater than 12 with a pace impedance of 560, threshold 0.5 at 0.5.  High-voltage impedance was 64 ohms.  The patient tolerated the procedure without apparent complication.     Deboraha Sprang, MD, [facility]     SCK/MEDQ  D:  12/14/2014  T:  12/15/2014  Job:  [id number]

## 2014-12-18 ENCOUNTER — Ambulatory Visit (INDEPENDENT_AMBULATORY_CARE_PROVIDER_SITE_OTHER): Payer: Self-pay | Admitting: *Deleted

## 2014-12-18 ENCOUNTER — Telehealth: Payer: Self-pay | Admitting: Internal Medicine

## 2014-12-18 DIAGNOSIS — I447 Left bundle-branch block, unspecified: Secondary | ICD-10-CM

## 2014-12-18 NOTE — Telephone Encounter (Signed)
Routing to device clinic 

## 2014-12-18 NOTE — Telephone Encounter (Signed)
Pt seeing blood through "patch."  Made device clinic appt today at 2:30.

## 2014-12-18 NOTE — Progress Notes (Signed)
The patient was seen in the device clinic for c/o bleeding at the device site.  He was implanted 12/14/14.  There was a 1cm spot of old blood noted on the tegaderm which was removed.  The wound was without redness or edema.  Follow up as scheduled 12/24/14.

## 2014-12-18 NOTE — Telephone Encounter (Signed)
New Msg       Pt had surgery on 12/14/14 and is bleeding at the site.     Please return call.

## 2014-12-21 ENCOUNTER — Encounter (HOSPITAL_COMMUNITY): Payer: Medicaid Other

## 2014-12-24 ENCOUNTER — Ambulatory Visit (INDEPENDENT_AMBULATORY_CARE_PROVIDER_SITE_OTHER): Payer: Medicaid Other | Admitting: *Deleted

## 2014-12-24 DIAGNOSIS — I5022 Chronic systolic (congestive) heart failure: Secondary | ICD-10-CM | POA: Diagnosis not present

## 2014-12-24 DIAGNOSIS — I447 Left bundle-branch block, unspecified: Secondary | ICD-10-CM | POA: Diagnosis not present

## 2014-12-24 DIAGNOSIS — I429 Cardiomyopathy, unspecified: Secondary | ICD-10-CM

## 2014-12-24 DIAGNOSIS — I428 Other cardiomyopathies: Secondary | ICD-10-CM

## 2014-12-24 LAB — MDC_IDC_ENUM_SESS_TYPE_INCLINIC
Battery Remaining Longevity: 94.8 mo
Brady Statistic RA Percent Paced: 14 %
Date Time Interrogation Session: 20160125155958
HighPow Impedance: 51.75 Ohm
Lead Channel Pacing Threshold Amplitude: 0.5 V
Lead Channel Pacing Threshold Amplitude: 0.75 V
Lead Channel Pacing Threshold Pulse Width: 0.5 ms
Lead Channel Pacing Threshold Pulse Width: 0.5 ms
Lead Channel Pacing Threshold Pulse Width: 0.5 ms
Lead Channel Pacing Threshold Pulse Width: 0.5 ms
Lead Channel Setting Pacing Amplitude: 3.5 V
Lead Channel Setting Pacing Pulse Width: 0.5 ms
Lead Channel Setting Sensing Sensitivity: 0.5 mV
MDC IDC MSMT LEADCHNL RA IMPEDANCE VALUE: 450 Ohm
MDC IDC MSMT LEADCHNL RA PACING THRESHOLD AMPLITUDE: 0.5 V
MDC IDC MSMT LEADCHNL RA SENSING INTR AMPL: 5 mV
MDC IDC MSMT LEADCHNL RV IMPEDANCE VALUE: 475 Ohm
MDC IDC MSMT LEADCHNL RV PACING THRESHOLD AMPLITUDE: 0.75 V
MDC IDC MSMT LEADCHNL RV SENSING INTR AMPL: 12 mV
MDC IDC PG SERIAL: 7209167
MDC IDC SET LEADCHNL RA PACING AMPLITUDE: 3.5 V
MDC IDC STAT BRADY RV PERCENT PACED: 0 %
Zone Setting Detection Interval: 250 ms
Zone Setting Detection Interval: 300 ms

## 2014-12-24 NOTE — Progress Notes (Signed)
Wound check appointment. Wound without redness or edema. Incision edges approximated, wound well healed. Normal device function. Thresholds, sensing, and impedances consistent with implant measurements. Device programmed at 3.5V for extra safety margin until 3 month visit. Histogram distribution appropriate for patient and level of activity. No mode switches or ventricular arrhythmias noted. Patient educated about wound care, arm mobility, lifting restrictions, shock plan. ROV on 4/21 @4 :00pm w/SK. Shock plan magnet given

## 2014-12-25 ENCOUNTER — Other Ambulatory Visit (HOSPITAL_COMMUNITY): Payer: Self-pay | Admitting: Anesthesiology

## 2014-12-26 ENCOUNTER — Other Ambulatory Visit: Payer: Self-pay | Admitting: *Deleted

## 2014-12-26 DIAGNOSIS — Z95810 Presence of automatic (implantable) cardiac defibrillator: Secondary | ICD-10-CM

## 2014-12-27 ENCOUNTER — Other Ambulatory Visit (HOSPITAL_COMMUNITY): Payer: Self-pay | Admitting: *Deleted

## 2014-12-27 MED ORDER — FUROSEMIDE 20 MG PO TABS: 20.0000 mg | ORAL_TABLET | Freq: Every day | ORAL | Status: DC

## 2015-01-03 ENCOUNTER — Ambulatory Visit (HOSPITAL_COMMUNITY)
Admission: RE | Admit: 2015-01-03 | Discharge: 2015-01-03 | Disposition: A | Discharge status: Home / Self Care | Payer: Medicaid Other | Source: Ambulatory Visit | Attending: Internal Medicine | Admitting: Internal Medicine

## 2015-01-03 VITALS — BP 132/80 | HR 85 | Wt 236.8 lb

## 2015-01-03 DIAGNOSIS — N189 Chronic kidney disease, unspecified: Secondary | ICD-10-CM | POA: Insufficient documentation

## 2015-01-03 DIAGNOSIS — I10 Essential (primary) hypertension: Secondary | ICD-10-CM | POA: Diagnosis not present

## 2015-01-03 DIAGNOSIS — I5022 Chronic systolic (congestive) heart failure: Secondary | ICD-10-CM | POA: Diagnosis not present

## 2015-01-03 DIAGNOSIS — R5383 Other fatigue: Secondary | ICD-10-CM | POA: Insufficient documentation

## 2015-01-03 NOTE — Patient Instructions (Signed)
Doing great!  Please resume Entresto. Per pharmacy, should be $3  Return in 1 week for labs.  Follow up 4 weeks.  Do the following things EVERYDAY: 1) Weigh yourself in the morning before breakfast. Write it down and keep it in a log. 2) Take your medicines as prescribed 3) Eat low salt foods-Limit salt (sodium) to 2000 mg per day.  4) Stay as active as you can everyday 5) Limit all fluids for the day to less than 2 liters

## 2015-01-03 NOTE — Progress Notes (Signed)
Patient ID: Roger Crosby, male   DOB: Sep 25, 1961, 54 y.o.   MRN: [id number]  Mr Oesterle is 54yo with history of systolic HF due to NICM . EF 04/2013 20-25%, HTN, LBBB and CKD. 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   Follow up: Since the last visit he ad ICD implant with failed epicardial lead  placement. Has to go see cardiac surgeon next week. Ran out of entresto about a week ago.  Ongoing dyspnea with exertion. Denies PND/Orthopnea. Weight at home 225-235 pounds.   Echo 10/24/14: EF 25-30% RV ok  Labs (9/14): K 3.9, creatinine 1.6 Labs (10/14): K 4, creatinine 1.3 Labs (10/05/13): K+ 4.1, creatinine 1.5  PMH: 1. Hypertension  2. GERD  3. Low back pain  4. LBBB: Newly noted 2011  5. Nonischemic cardiomyopathy: Echo (5/11): EF 45-50%, mild hypokinesis of the apical anteroseptal wall, apical anterior wall, and true apex (LAD distribution), mild LAE.  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  Echo (6/14) with EF 20-25% with inferior/inferoseptal akinesis and hypokinesis elsewhere.  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  6. CKD   Family History:  Mother deceased CHF,DM,HTN  Father with heart trouble...died age 15.  1 sister Crohn's disease  1 sister healthy  1 brother died Prostate cancer/AIDS...was diagnosed with prostate cancer at age 37.  Father and mother both had MIs in their 70s   Social History:  [employer]; has been working for [employer]. On disability now.. HS graduate.  Married  3 kids  Alcohol use-no. None since 2008  Drug use-prior cocaine none since 2008. Denies IVDU.  Nonsmoker Prior smoker none since 2008.   Review of Systems  All systems reviewed and negative except as per HPI.   Current Outpatient Prescriptions  Medication Sig Dispense Refill  . aspirin EC 81 MG tablet Take 1 tablet (81 mg total) by mouth daily.    . carvedilol  (COREG) 12.5 MG tablet Take 1.5 tablets (18.75 mg total) by mouth 2 (two) times daily with a meal. 90 tablet 3  . furosemide (LASIX) 20 MG tablet Take 1 tablet (20 mg total) by mouth daily. 30 tablet 6  . HYDROcodone-acetaminophen (NORCO) 5-325 MG per tablet Take 2 tablets by mouth every 6 (six) hours as needed for severe pain. 20 tablet 0  . omeprazole (PRILOSEC) 20 MG capsule TAKE ONE CAPSULE BY MOUTH EVERY DAY    . omeprazole (PRILOSEC) 20 MG capsule TAKE ONE CAPSULE BY MOUTH EVERY DAY 30 capsule 0  . spironolactone (ALDACTONE) 25 MG tablet Take 1 tablet (25 mg total) by mouth daily. 30 tablet 3  . Sacubitril-Valsartan (ENTRESTO) 49-51 MG TABS Take 1 tablet by mouth 2 (two) times daily. (Patient not taking: Reported on 01/03/2015) 60 tablet 3   No current facility-administered medications for this encounter.    Filed Vitals:   01/03/15 1512  BP: 132/80  Pulse: 85  Weight: 236 lb 12.8 oz (107.412 kg)  SpO2: 97%   General: NAD Neck: JVD 5-6, no thyromegaly or thyroid nodule.  Lungs: Clear to auscultation bilaterally with normal respiratory effort. CV: Nondisplaced PMI.  Heart regular S1/S2, no S3/S4, no murmur.  trace peripheral edema.  No carotid bruit.  Normal pedal pulses.  Abdomen: Soft, nontender, no hepatosplenomegaly, no distention.  Neurologic: Alert and oriented x 3.  Psych: Normal affect. Extremities: No clubbing or cyanosis.   Assessment/Plan:  1. Chronic Systolic  Heart Failure. NICM. ECHO 04/2013 EF 20-25%.Echo today EF 25-30%  - Persistent NYHA III symptoms. EF persistently < 35%. S/P St Jude ICD but unable to place epicardial lead placed. Has referral to Dr Cyndia Bent.  - Continue coreg at 18.75mg  BID - Off entresto. Will reorder today. I contacted the pharmacy and he will pick up today. Continue Entresto 49/51 mg bid.  - Volume status stable. Continue current dose of lasix and spiro.  - Reinforced the need and importance of daily weights, a low sodium diet, and fluid  restriction (less than 2 L a day). Instructed to call the HF clinic if weight increases more than 3 lbs overnight or 5 lbs in a week.   - Reinforced need to take more responsibility for his medicines and caring for his HF.  - Will need CPX after epicardial lead placed.   2. CKD:  -Check BMET in 1 week.  3. HTN:  - Stable on current regimen. Changes as above 4. Fatigue - Suspect he may have OSA. Has sleep study in March.   Follow up in 4 weeks. Check BMET next week.  CLEGG,AMY NP-C  01/03/2015  Patient seen and examined with Darrick Grinder, NP. We discussed all aspects of the encounter. I agree with the assessment and plan as stated above.   He is doing well. Pending epicardial lead placement for CRT. Volume status looks good. Restart Entresto. Defer CPX until after CRT.   Daniel Bensimhon,MD 4:22 PM

## 2015-01-09 ENCOUNTER — Encounter: Payer: Self-pay | Admitting: Surgery

## 2015-01-09 ENCOUNTER — Other Ambulatory Visit: Payer: Self-pay | Admitting: *Deleted

## 2015-01-09 ENCOUNTER — Encounter: Payer: Self-pay | Admitting: Internal Medicine

## 2015-01-09 ENCOUNTER — Institutional Professional Consult (permissible substitution) (INDEPENDENT_AMBULATORY_CARE_PROVIDER_SITE_OTHER): Payer: Medicaid Other | Admitting: Surgery

## 2015-01-09 VITALS — BP 130/89 | HR 75 | Resp 16 | Ht 68.0 in | Wt 235.0 lb

## 2015-01-09 DIAGNOSIS — I5020 Unspecified systolic (congestive) heart failure: Secondary | ICD-10-CM

## 2015-01-09 DIAGNOSIS — I509 Heart failure, unspecified: Secondary | ICD-10-CM

## 2015-01-10 ENCOUNTER — Other Ambulatory Visit (HOSPITAL_COMMUNITY): Payer: Medicaid Other

## 2015-01-13 ENCOUNTER — Encounter: Payer: Self-pay | Admitting: Surgery

## 2015-01-13 NOTE — Progress Notes (Signed)
Cardiothoracic Surgery Consultation   PCP is No PCP Per Patient Referring Provider is Deboraha Sprang, MD  Chief Complaint  Patient presents with  . Congestive Heart Failure    eval for lead placement    HPI:  The patient is a 54 year old gentleman with a history of HTN, stage 2 CKD, hypercholesterolemia, non-obstructive coronary disease and non-ischemic cardiomyopathy with an EF of 25-30% with a dilated LV by echo 09/2014. He does give a history of alcohol use and crack cocaine use. He underwent insertion of a dual chamber ICD on 12/14/2014 by Dr. Caryl Comes but he could not get an LV lead placed. He has dyspnea with mild exertion even walking on a level surface as well as some peripheral edema.  Past Medical History  Diagnosis Date  . HTN (hypertension)   . GERD (gastroesophageal reflux disease)   . LBP (low back pain)   . LBBB (left bundle branch block)     dx 2011  . Hx of echocardiogram     echo 5/11: EF 45-50%, mild HK of Ap AS wall, ap Ant wall and true apex (LAD distribution), mild LAE  . NICM (nonischemic cardiomyopathy)   . CAD (coronary artery disease)     nonobstructive by [facility] 6/11: oOM1 40%, mild luminal irregs elsewhere  . Systolic heart failure   . AICD (automatic cardioverter/defibrillator) present     a.  Unsuccessful LV lead placement 11/2014  . High cholesterol   . CHF (congestive heart failure)   . Sleep apnea   . CKD (chronic kidney disease)     Past Surgical History  Procedure Laterality Date  . Left heart catheterization with coronary angiogram N/A 08/04/2013    Procedure: LEFT HEART CATHETERIZATION WITH CORONARY ANGIOGRAM;  Surgeon: Larey Dresser, MD;  Location: [facility] CATH LAB;  Service: Cardiovascular;  Laterality: N/A;  . Cardiac defibrillator placement  12/14/2014    with failed LV lead placeemnt  . Cardiac catheterization Left 07/2013    with angiogram  . Bi-ventricular implantable cardioverter defibrillator N/A 12/14/2014    Procedure: BI-VENTRICULAR  IMPLANTABLE CARDIOVERTER DEFIBRILLATOR  (CRT-D);  Surgeon: Deboraha Sprang, MD;  Location: [facility] CATH LAB;  Service: Cardiovascular;  Laterality: N/A;    Family History  Problem Relation Age of Onset  . Heart attack Mother     in 75's  . Heart disease Mother     ischemic  . Heart attack Father     in 77's  . Heart disease Father     ischemic  . Congestive Heart Failure Mother   . Diabetes Mother   . Hypertension Mother   . Heart Problems Father   . Crohn's disease Sister   . Prostate cancer Brother   . HIV/AIDS Brother     Social History History  Substance Use Topics  . Smoking status: Former Smoker -- 0.12 packs/day for 25 years    Types: Cigarettes    Quit date: 07/31/2014  . Smokeless tobacco: Never Used  . Alcohol Use: Yes     Comment: 12/14/2014 "might have a mixed drink once/month"    Current Outpatient Prescriptions  Medication Sig Dispense Refill  . aspirin EC 81 MG tablet Take 1 tablet (81 mg total) by mouth daily.    . carvedilol (COREG) 12.5 MG tablet Take 1.5 tablets (18.75 mg total) by mouth 2 (two) times daily with a meal. 90 tablet 3  . furosemide (LASIX) 20 MG tablet Take 1 tablet (20 mg total) by mouth daily. [place]  tablet 6  . HYDROcodone-acetaminophen (NORCO) 5-325 MG per tablet Take 2 tablets by mouth every 6 (six) hours as needed for severe pain. 20 tablet 0  . omeprazole (PRILOSEC) 20 MG capsule TAKE ONE CAPSULE BY MOUTH EVERY DAY 30 capsule 0  . Sacubitril-Valsartan (ENTRESTO) 49-51 MG TABS Take 1 tablet by mouth 2 (two) times daily. 60 tablet 3  . spironolactone (ALDACTONE) 25 MG tablet Take 1 tablet (25 mg total) by mouth daily. 30 tablet 3   No current facility-administered medications for this visit.    No Known Allergies  Review of Systems  Constitutional: Positive for fatigue.  HENT: Negative.   Eyes: Negative.   Respiratory: Positive for shortness of breath.   Cardiovascular: Positive for leg swelling. Negative for chest pain.       Denies  orthopnea and PND  Gastrointestinal: Negative.   Endocrine: Negative.   Genitourinary: Negative.   Musculoskeletal: Negative.   Skin: Negative.   Neurological: Negative.   Hematological: Negative.   Psychiatric/Behavioral: Negative.     BP 130/89 mmHg  Pulse 75  Resp 16  Ht 5\' 8"  (1.727 m)  Wt 235 lb (106.595 kg)  BMI 35.74 kg/m2  SpO2 98% Physical Exam  Constitutional: He is oriented to person, place, and time. He appears well-developed and well-nourished. No distress.  HENT:  Head: Normocephalic and atraumatic.  Mouth/Throat: Oropharynx is clear and moist.  Eyes: Pupils are equal, round, and reactive to light.  Neck: Normal range of motion. Neck supple. No JVD present. No thyromegaly present.  Cardiovascular: Normal rate, regular rhythm, normal heart sounds and intact distal pulses.   No murmur heard. Pulmonary/Chest: Effort normal and breath sounds normal. No respiratory distress.  ICD generator left anterior chest in subcutaneous pocket  Abdominal: Soft. Bowel sounds are normal. He exhibits no distension and no mass. There is no tenderness.  Musculoskeletal: Normal range of motion. He exhibits no edema.  Lymphadenopathy:    He has no cervical adenopathy.  Neurological: He is alert and oriented to person, place, and time. He has normal strength. No cranial nerve deficit or sensory deficit.  Skin: Skin is warm and dry.  Psychiatric: He has a normal mood and affect.     Impression:  He has non-ischemic cardiomyopathy with NYHA class 3 symptoms of heart failure. A percutaneous LV pacing lead could not be placed due to anatomical constraints. I agree with the potential benefit of placing an epicardial lead via a small left thoracotomy. I discussed the procedure with the patient and his family including alternatives, benefits, and risks including but not limited to bleeding, infection, injury to the heart, malfunction of the lead or ICD system requiring revision and he  understands and agrees to proceed.  Plan:  He would like to schedule this for Thursday January 31, 2015.

## 2015-01-17 ENCOUNTER — Ambulatory Visit (HOSPITAL_COMMUNITY)
Admission: RE | Admit: 2015-01-17 | Discharge: 2015-01-17 | Disposition: A | Discharge status: Home / Self Care | Payer: Medicaid Other | Source: Ambulatory Visit | Attending: Internal Medicine | Admitting: Internal Medicine

## 2015-01-17 DIAGNOSIS — I5022 Chronic systolic (congestive) heart failure: Secondary | ICD-10-CM | POA: Diagnosis not present

## 2015-01-17 LAB — BASIC METABOLIC PANEL
Anion gap: 6 (ref 5–15)
BUN: 10 mg/dL (ref 6–23)
CO2: 25 mmol/L (ref 19–32)
CREATININE: 1.26 mg/dL (ref 0.50–1.35)
Calcium: 8.9 mg/dL (ref 8.4–10.5)
Chloride: 109 mmol/L (ref 96–112)
GFR, EST AFRICAN AMERICAN: 74 mL/min — AB (ref >90)
GFR, EST NON AFRICAN AMERICAN: 64 mL/min — AB (ref >90)
Glucose, Bld: 101 mg/dL — ABNORMAL HIGH (ref 70–99)
POTASSIUM: 3.9 mmol/L (ref 3.5–5.1)
Sodium: 140 mmol/L (ref 135–145)

## 2015-01-20 ENCOUNTER — Encounter (HOSPITAL_COMMUNITY): Payer: Self-pay | Admitting: *Deleted

## 2015-01-20 ENCOUNTER — Emergency Department (HOSPITAL_COMMUNITY)
Admission: EM | Admit: 2015-01-20 | Discharge: 2015-01-20 | Disposition: A | Discharge status: Home / Self Care | Payer: Medicaid Other | Attending: Emergency Medicine | Admitting: Emergency Medicine

## 2015-01-20 DIAGNOSIS — Z7982 Long term (current) use of aspirin: Secondary | ICD-10-CM | POA: Diagnosis not present

## 2015-01-20 DIAGNOSIS — I5020 Unspecified systolic (congestive) heart failure: Secondary | ICD-10-CM | POA: Insufficient documentation

## 2015-01-20 DIAGNOSIS — K219 Gastro-esophageal reflux disease without esophagitis: Secondary | ICD-10-CM | POA: Insufficient documentation

## 2015-01-20 DIAGNOSIS — Z8639 Personal history of other endocrine, nutritional and metabolic disease: Secondary | ICD-10-CM | POA: Diagnosis not present

## 2015-01-20 DIAGNOSIS — H9192 Unspecified hearing loss, left ear: Secondary | ICD-10-CM | POA: Insufficient documentation

## 2015-01-20 DIAGNOSIS — H9202 Otalgia, left ear: Secondary | ICD-10-CM | POA: Diagnosis present

## 2015-01-20 DIAGNOSIS — Z87891 Personal history of nicotine dependence: Secondary | ICD-10-CM | POA: Insufficient documentation

## 2015-01-20 DIAGNOSIS — Z95810 Presence of automatic (implantable) cardiac defibrillator: Secondary | ICD-10-CM | POA: Insufficient documentation

## 2015-01-20 DIAGNOSIS — N189 Chronic kidney disease, unspecified: Secondary | ICD-10-CM | POA: Insufficient documentation

## 2015-01-20 DIAGNOSIS — I129 Hypertensive chronic kidney disease with stage 1 through stage 4 chronic kidney disease, or unspecified chronic kidney disease: Secondary | ICD-10-CM | POA: Diagnosis not present

## 2015-01-20 DIAGNOSIS — Z9889 Other specified postprocedural states: Secondary | ICD-10-CM | POA: Insufficient documentation

## 2015-01-20 DIAGNOSIS — Z79899 Other long term (current) drug therapy: Secondary | ICD-10-CM | POA: Diagnosis not present

## 2015-01-20 DIAGNOSIS — I2510 Atherosclerotic heart disease of native coronary artery without angina pectoris: Secondary | ICD-10-CM | POA: Diagnosis not present

## 2015-01-20 MED ORDER — PSEUDOEPHEDRINE HCL 30 MG PO TABS: 30.0000 mg | ORAL_TABLET | Freq: Four times a day (QID) | ORAL | Status: DC | PRN

## 2015-01-20 NOTE — ED Provider Notes (Signed)
CSN: [id number]     Arrival date & time 01/20/15  1537 History  This chart was scribed for non-physician practitioner, Jeannett Senior, PA-C working with Roger Graven, DO by Frederich Balding, ED scribe. This patient was seen in room TR08C/TR08C and the patient's care was started at 4:04 PM.    Chief Complaint  Patient presents with  . Otalgia   The history is provided by the patient. No language interpreter was used.    HPI Comments: Roger Crosby is a 54 y.o. male who presents to the Emergency Department complaining of muffled hearing that started 3 weeks ago. Pt reports intermittent popping in his ear. He has tried cleaning it with a bobby pin with no relief. There are no exacerbating or alleviating factors. Pt denies ear pain, ear drainage, tinnitus, sore throat, sinus pressure, dizziness.   Past Medical History  Diagnosis Date  . HTN (hypertension)   . GERD (gastroesophageal reflux disease)   . LBP (low back pain)   . LBBB (left bundle branch block)     dx 2011  . Hx of echocardiogram     echo 5/11: EF 45-50%, mild HK of Ap AS wall, ap Ant wall and true apex (LAD distribution), mild LAE  . NICM (nonischemic cardiomyopathy)   . CAD (coronary artery disease)     nonobstructive by [facility] 6/11: oOM1 40%, mild luminal irregs elsewhere  . Systolic heart failure   . AICD (automatic cardioverter/defibrillator) present     a.  Unsuccessful LV lead placement 11/2014  . High cholesterol   . CHF (congestive heart failure)   . Sleep apnea   . CKD (chronic kidney disease)    Past Surgical History  Procedure Laterality Date  . Left heart catheterization with coronary angiogram N/A 08/04/2013    Procedure: LEFT HEART CATHETERIZATION WITH CORONARY ANGIOGRAM;  Surgeon: Larey Dresser, MD;  Location: [facility] CATH LAB;  Service: Cardiovascular;  Laterality: N/A;  . Cardiac defibrillator placement  12/14/2014    with failed LV lead placeemnt  . Cardiac catheterization Left 07/2013    with angiogram  .  Bi-ventricular implantable cardioverter defibrillator N/A 12/14/2014    Procedure: BI-VENTRICULAR IMPLANTABLE CARDIOVERTER DEFIBRILLATOR  (CRT-D);  Surgeon: Deboraha Sprang, MD;  Location: [facility] CATH LAB;  Service: Cardiovascular;  Laterality: N/A;   Family History  Problem Relation Age of Onset  . Heart attack Mother     in 29's  . Heart disease Mother     ischemic  . Heart attack Father     in 28's  . Heart disease Father     ischemic  . Congestive Heart Failure Mother   . Diabetes Mother   . Hypertension Mother   . Heart Problems Father   . Crohn's disease Sister   . Prostate cancer Brother   . HIV/AIDS Brother    History  Substance Use Topics  . Smoking status: Former Smoker -- 0.12 packs/day for 25 years    Types: Cigarettes    Quit date: 07/31/2014  . Smokeless tobacco: Never Used  . Alcohol Use: Yes     Comment: 12/14/2014 "might have a mixed drink once/month"    Review of Systems  HENT: Positive for hearing loss. Negative for ear discharge, ear pain, sinus pressure, sore throat and tinnitus.   Neurological: Negative for dizziness.  All other systems reviewed and are negative.  Allergies  Review of patient's allergies indicates no known allergies.  Home Medications   Prior to Admission medications   Medication Sig Start  Date End Date Taking? Authorizing Provider  aspirin EC 81 MG tablet Take 1 tablet (81 mg total) by mouth daily. 08/29/12   Liliane Shi, PA-C  carvedilol (COREG) 12.5 MG tablet Take 1.5 tablets (18.75 mg total) by mouth 2 (two) times daily with a meal. 10/24/14   Jolaine Artist, MD  furosemide (LASIX) 20 MG tablet Take 1 tablet (20 mg total) by mouth daily. 12/27/14   Jolaine Artist, MD  HYDROcodone-acetaminophen (NORCO) 5-325 MG per tablet Take 2 tablets by mouth every 6 (six) hours as needed for severe pain. 12/15/14   Liliane Shi, PA-C  omeprazole (PRILOSEC) 20 MG capsule TAKE ONE CAPSULE BY MOUTH EVERY DAY 12/26/14   Jolaine Artist, MD   Sacubitril-Valsartan (ENTRESTO) 49-51 MG TABS Take 1 tablet by mouth 2 (two) times daily. 08/22/14   Jolaine Artist, MD  spironolactone (ALDACTONE) 25 MG tablet Take 1 tablet (25 mg total) by mouth daily. 10/24/14   Shaune Pascal Bensimhon, MD   BP 125/69 mmHg  Pulse 98  Temp(Src) 98.3 F (36.8 C) (Oral)  Resp 20  Ht 5\' 8"  (1.727 m)  Wt 230 lb (104.327 kg)  BMI 34.98 kg/m2  SpO2 95%   Physical Exam  Constitutional: He is oriented to person, place, and time. He appears well-developed and well-nourished. No distress.  HENT:  Head: Normocephalic and atraumatic.  Right Ear: Tympanic membrane and ear canal normal.  Left Ear: Tympanic membrane and ear canal normal.  Nose: Nose normal.  Mouth/Throat: Oropharynx is clear and moist.  Normal ear canals and TMs bilaterally.   Eyes: Conjunctivae and EOM are normal. Pupils are equal, round, and reactive to light.  Neck: Neck supple. No tracheal deviation present.  Cardiovascular: Normal rate.   Pulmonary/Chest: Effort normal. No respiratory distress.  Musculoskeletal: Normal range of motion.  Neurological: He is alert and oriented to person, place, and time.  Skin: Skin is warm and dry.  Psychiatric: He has a normal mood and affect. His behavior is normal.  Nursing note and vitals reviewed.   ED Course  Procedures (including critical care time)  DIAGNOSTIC STUDIES: Oxygen Saturation is 95% on RA, adequate by my interpretation.    COORDINATION OF CARE: 4:08 PM-Discussed treatment plan which includes sudafed and ENT referral with pt at bedside and pt agreed to plan.   Labs Review Labs Reviewed - No data to display  Imaging Review No results found.   EKG Interpretation None      MDM   Final diagnoses:  Decreased hearing of left ear    Patient with decreased hearing in left ear. Exam is unremarkable, his left ear, ear canal, TM appear normal. He states he feels some popping sensation in the left ear, advised to try some  decongestants. We'll refer to ear nose throat.  Filed Vitals:   01/20/15 1540  BP: 125/69  Pulse: 98  Temp: 98.3 F (36.8 C)  TempSrc: Oral  Resp: 20  Height: 5\' 8"  (1.727 m)  Weight: 230 lb (104.327 kg)  SpO2: 95%     I personally performed the services described in this documentation, which was scribed in my presence. The recorded information has been reviewed and is accurate.   Renold Genta, PA-C 01/20/15 [place], DO 01/22/15 [phone]

## 2015-01-20 NOTE — Discharge Instructions (Signed)
Sudafed for congestion. Follow up with ear nose throat physician for further evaluation of your ear pain.   Hearing Loss A hearing loss is sometimes called deafness. Hearing loss may be partial or total. CAUSES Hearing loss may be caused by:  Wax in the ear canal.  Infection of the ear canal.  Infection of the middle ear.  Trauma to the ear or surrounding area.  Fluid in the middle ear.  A hole in the eardrum (perforated eardrum).  Exposure to loud sounds or music.  Problems with the hearing nerve.  Certain medications. Hearing loss without wax, infection, or a history of injury may mean that the nerve is involved. Hearing loss with severe dizziness, nausea and vomiting or ringing in the ear may suggest a hearing nerve irritation or problems in the middle or inner ear. If hearing loss is untreated, there is a greater likelihood for residual or permanent hearing loss. DIAGNOSIS A hearing test (audiometry) assesses hearing loss. The audiometry test needs to be performed by a hearing specialist (audiologist). TREATMENT Treatment for recent onset of hearing loss may include:  Ear wax removal.  Medications that kill germs (antibiotics).  Cortisone medications.  Prompt follow up with the appropriate specialist. Return of hearing depends on the cause of your hearing loss, so proper medical follow-up is important. Some hearing loss may not be reversible, and a caregiver should discuss care and treatment options with you. SEEK MEDICAL CARE IF:   You have a severe headache, dizziness, or changes in vision.  You have new or increased weakness.  You develop repeated vomiting or other serious medical problems.  You have a fever. Document Released: 11/16/2005 Document Revised: 02/08/2012 Document Reviewed: 03/13/2010 [facility] Patient Information 2015 [place], [place]. This information is not intended to replace advice given to you by your health care provider. Make sure you discuss  any questions you have with your health care provider.

## 2015-01-20 NOTE — ED Notes (Signed)
Pt reports left ear pain and difficulty hearing ou of left ear for three weeks. Pt denies drainage from ear and denies pain at this time.

## 2015-01-20 NOTE — ED Notes (Signed)
Declined W/C at D/C and was escorted to lobby by RN. 

## 2015-01-23 ENCOUNTER — Other Ambulatory Visit (HOSPITAL_COMMUNITY): Payer: Self-pay | Admitting: Cardiology

## 2015-01-24 ENCOUNTER — Telehealth: Payer: Self-pay | Admitting: Cardiology

## 2015-01-24 NOTE — Telephone Encounter (Signed)
Pt enrolled in ICM clinic. 

## 2015-01-25 ENCOUNTER — Other Ambulatory Visit (HOSPITAL_COMMUNITY): Payer: Self-pay | Admitting: *Deleted

## 2015-01-28 ENCOUNTER — Encounter (HOSPITAL_COMMUNITY): Payer: Self-pay | Admitting: Pharmacy Technician

## 2015-01-28 ENCOUNTER — Other Ambulatory Visit: Payer: Self-pay | Admitting: *Deleted

## 2015-01-29 ENCOUNTER — Inpatient Hospital Stay (HOSPITAL_COMMUNITY): Admission: RE | Admit: 2015-01-29 | Payer: Medicaid Other | Source: Ambulatory Visit

## 2015-01-31 ENCOUNTER — Encounter (HOSPITAL_COMMUNITY): Payer: Medicaid Other

## 2015-02-05 ENCOUNTER — Encounter (HOSPITAL_COMMUNITY)
Admission: RE | Admit: 2015-02-05 | Discharge: 2015-02-05 | Disposition: A | Discharge status: Home / Self Care | Payer: Medicaid Other | Source: Ambulatory Visit | Attending: Surgery | Admitting: Surgery

## 2015-02-05 ENCOUNTER — Other Ambulatory Visit: Payer: Self-pay

## 2015-02-05 ENCOUNTER — Encounter (HOSPITAL_COMMUNITY): Payer: Self-pay

## 2015-02-05 VITALS — BP 147/89 | HR 81 | Temp 98.7°F | Resp 20 | Ht 68.0 in | Wt 230.0 lb

## 2015-02-05 DIAGNOSIS — I509 Heart failure, unspecified: Secondary | ICD-10-CM

## 2015-02-05 LAB — COMPREHENSIVE METABOLIC PANEL
ALBUMIN: 3.7 g/dL (ref 3.5–5.2)
ALK PHOS: 67 U/L (ref 39–117)
ALT: 19 U/L (ref 0–53)
ANION GAP: 6 (ref 5–15)
AST: 14 U/L (ref 0–37)
BILIRUBIN TOTAL: 0.6 mg/dL (ref 0.3–1.2)
BUN: 16 mg/dL (ref 6–23)
CHLORIDE: 108 mmol/L (ref 96–112)
CO2: 26 mmol/L (ref 19–32)
Calcium: 9.1 mg/dL (ref 8.4–10.5)
Creatinine, Ser: 1.36 mg/dL — ABNORMAL HIGH (ref 0.50–1.35)
GFR calc non Af Amer: 58 mL/min — ABNORMAL LOW (ref >90)
GFR, EST AFRICAN AMERICAN: 67 mL/min — AB (ref >90)
Glucose, Bld: 82 mg/dL (ref 70–99)
POTASSIUM: 4.1 mmol/L (ref 3.5–5.1)
Sodium: 140 mmol/L (ref 135–145)
Total Protein: 6.7 g/dL (ref 6.0–8.3)

## 2015-02-05 LAB — URINALYSIS, ROUTINE W REFLEX MICROSCOPIC
BILIRUBIN URINE: NEGATIVE
GLUCOSE, UA: NEGATIVE mg/dL
HGB URINE DIPSTICK: NEGATIVE
Ketones, ur: NEGATIVE mg/dL
LEUKOCYTES UA: NEGATIVE
Nitrite: NEGATIVE
PH: 6 (ref 5.0–8.0)
PROTEIN: NEGATIVE mg/dL
Specific Gravity, Urine: 1.023 (ref 1.005–1.030)
Urobilinogen, UA: 0.2 mg/dL (ref 0.0–1.0)

## 2015-02-05 LAB — BLOOD GAS, ARTERIAL
Acid-base deficit: 1 mmol/L (ref 0.0–2.0)
BICARBONATE: 22 meq/L (ref 20.0–24.0)
Drawn by: 128831
FIO2: 0.21 %
O2 SAT: 92.2 %
PCO2 ART: 29.7 mmHg — AB (ref 35.0–45.0)
PO2 ART: 58.8 mmHg — AB (ref 80.0–100.0)
Patient temperature: 98.6
TCO2: 22.9 mmol/L (ref 0–100)
pH, Arterial: 7.483 — ABNORMAL HIGH (ref 7.350–7.450)

## 2015-02-05 LAB — ABO/RH: ABO/RH(D): O POS

## 2015-02-05 LAB — CBC
HEMATOCRIT: 43.5 % (ref 39.0–52.0)
Hemoglobin: 14.1 g/dL (ref 13.0–17.0)
MCH: 25.9 pg — ABNORMAL LOW (ref 26.0–34.0)
MCHC: 32.4 g/dL (ref 30.0–36.0)
MCV: 80 fL (ref 78.0–100.0)
Platelets: 278 10*3/uL (ref 150–400)
RBC: 5.44 MIL/uL (ref 4.22–5.81)
RDW: 15 % (ref 11.5–15.5)
WBC: 8.3 10*3/uL (ref 4.0–10.5)

## 2015-02-05 LAB — SURGICAL PCR SCREEN
MRSA, PCR: NEGATIVE
STAPHYLOCOCCUS AUREUS: NEGATIVE

## 2015-02-05 LAB — PROTIME-INR
INR: 0.96 (ref 0.00–1.49)
PROTHROMBIN TIME: 12.9 s (ref 11.6–15.2)

## 2015-02-05 LAB — APTT: aPTT: 32 seconds (ref 24–37)

## 2015-02-05 NOTE — Progress Notes (Signed)
Message left for [employer]) to call concerning pt. Surgery.  Peri-operative orders for Implanted Device  Faxed to [facility].

## 2015-02-05 NOTE — Pre-Procedure Instructions (Signed)
Emmitt Matthews  02/05/2015   Your procedure is scheduled on:  02-07-2015   Thursday   Report to [facility] Admitting at 7:00 AM.   Call this number if you have problems the morning of surgery: [phone]   Remember:   Do not eat food or drink liquids after midnight.    Take these medicines the morning of surgery with A SIP OF WATER:carvedillol(Corgeg),pain medication as needed,omrprazole(Prilosec),   Do not wear jewelry,  Do not wear lotions, powders, or perfumes. You may not wear deodorant.   Do not shave 48 hours prior to surgery. Men may shave face and neck.   Do not bring valuables to the hospital.  [facility] is not responsible   for any belongings or valuables.               Contacts, dentures or bridgework may not be worn into surgery.   Leave suitcase in the car. After surgery it may be brought to your room.   For patients admitted to the hospital, discharge time is determined by your  treatment team.               .    Special Instructions: See attached sheet for instructions on CHG  Shower/bath     Please read over the following fact sheets that you were given: Pain Booklet, Coughing and Deep Breathing, Blood Transfusion Information and Surgical Site Infection Prevention

## 2015-02-06 ENCOUNTER — Encounter (HOSPITAL_BASED_OUTPATIENT_CLINIC_OR_DEPARTMENT_OTHER): Payer: Medicaid Other

## 2015-02-06 MED ORDER — DEXTROSE 5 % IV SOLN
1.5000 g | INTRAVENOUS | Status: AC
  Administered 2015-02-07: 1.5 g via INTRAVENOUS
  Filled 2015-02-06: qty 1.5

## 2015-02-06 NOTE — Progress Notes (Signed)
[place] notified of date and time of pt. Surgery,stated that someone will be here.

## 2015-02-07 ENCOUNTER — Inpatient Hospital Stay (HOSPITAL_COMMUNITY): Payer: Medicaid Other

## 2015-02-07 ENCOUNTER — Inpatient Hospital Stay (HOSPITAL_COMMUNITY): Payer: Medicaid Other | Admitting: Certified Registered"

## 2015-02-07 ENCOUNTER — Encounter (HOSPITAL_COMMUNITY)
Admission: RE | Disposition: A | Discharge status: Home / Self Care | Payer: Self-pay | Source: Ambulatory Visit | Attending: Surgery

## 2015-02-07 ENCOUNTER — Inpatient Hospital Stay (HOSPITAL_COMMUNITY)
Admission: RE | Admit: 2015-02-07 | Discharge: 2015-02-10 | DRG: 265 | Disposition: A | Discharge status: Home / Self Care | Payer: Medicaid Other | Source: Ambulatory Visit | Attending: Surgery | Admitting: Surgery

## 2015-02-07 ENCOUNTER — Encounter (HOSPITAL_COMMUNITY): Payer: Self-pay | Admitting: *Deleted

## 2015-02-07 DIAGNOSIS — I428 Other cardiomyopathies: Principal | ICD-10-CM | POA: Diagnosis present

## 2015-02-07 DIAGNOSIS — G4730 Sleep apnea, unspecified: Secondary | ICD-10-CM | POA: Diagnosis present

## 2015-02-07 DIAGNOSIS — I129 Hypertensive chronic kidney disease with stage 1 through stage 4 chronic kidney disease, or unspecified chronic kidney disease: Secondary | ICD-10-CM | POA: Diagnosis present

## 2015-02-07 DIAGNOSIS — Z87898 Personal history of other specified conditions: Secondary | ICD-10-CM | POA: Diagnosis not present

## 2015-02-07 DIAGNOSIS — Z87891 Personal history of nicotine dependence: Secondary | ICD-10-CM

## 2015-02-07 DIAGNOSIS — I509 Heart failure, unspecified: Secondary | ICD-10-CM

## 2015-02-07 DIAGNOSIS — Z7982 Long term (current) use of aspirin: Secondary | ICD-10-CM

## 2015-02-07 DIAGNOSIS — Z95810 Presence of automatic (implantable) cardiac defibrillator: Secondary | ICD-10-CM | POA: Diagnosis present

## 2015-02-07 DIAGNOSIS — N9989 Other postprocedural complications and disorders of genitourinary system: Secondary | ICD-10-CM | POA: Diagnosis present

## 2015-02-07 DIAGNOSIS — E780 Pure hypercholesterolemia: Secondary | ICD-10-CM | POA: Diagnosis present

## 2015-02-07 DIAGNOSIS — I2510 Atherosclerotic heart disease of native coronary artery without angina pectoris: Secondary | ICD-10-CM | POA: Diagnosis present

## 2015-02-07 DIAGNOSIS — R338 Other retention of urine: Secondary | ICD-10-CM | POA: Diagnosis present

## 2015-02-07 DIAGNOSIS — I2589 Other forms of chronic ischemic heart disease: Secondary | ICD-10-CM | POA: Diagnosis not present

## 2015-02-07 DIAGNOSIS — N182 Chronic kidney disease, stage 2 (mild): Secondary | ICD-10-CM | POA: Diagnosis present

## 2015-02-07 DIAGNOSIS — K219 Gastro-esophageal reflux disease without esophagitis: Secondary | ICD-10-CM | POA: Diagnosis present

## 2015-02-07 DIAGNOSIS — Z8249 Family history of ischemic heart disease and other diseases of the circulatory system: Secondary | ICD-10-CM

## 2015-02-07 DIAGNOSIS — Z6834 Body mass index (BMI) 34.0-34.9, adult: Secondary | ICD-10-CM

## 2015-02-07 DIAGNOSIS — I5022 Chronic systolic (congestive) heart failure: Secondary | ICD-10-CM | POA: Diagnosis present

## 2015-02-07 HISTORY — PX: THORACOTOMY: SHX5074

## 2015-02-07 HISTORY — PX: EPICARDIAL PACING LEAD PLACEMENT: SHX6274

## 2015-02-07 HISTORY — DX: Presence of automatic (implantable) cardiac defibrillator: Z95.810

## 2015-02-07 LAB — TYPE AND SCREEN
ABO/RH(D): O POS
ABO/RH(D): O POS
Antibody Screen: NEGATIVE
Antibody Screen: NEGATIVE

## 2015-02-07 SURGERY — THORACOTOMY, MAJOR
Anesthesia: General | Site: Chest

## 2015-02-07 MED ORDER — FENTANYL CITRATE 0.05 MG/ML IJ SOLN
INTRAMUSCULAR | Status: AC
  Filled 2015-02-07: qty 5

## 2015-02-07 MED ORDER — FENTANYL CITRATE 0.05 MG/ML IJ SOLN
INTRAMUSCULAR | Status: DC | PRN
  Administered 2015-02-07: 50 ug via INTRAVENOUS
  Administered 2015-02-07 (×2): 250 ug via INTRAVENOUS
  Administered 2015-02-07 (×3): 50 ug via INTRAVENOUS

## 2015-02-07 MED ORDER — 0.9 % SODIUM CHLORIDE (POUR BTL) OPTIME
TOPICAL | Status: DC | PRN
  Administered 2015-02-07: 1000 mL

## 2015-02-07 MED ORDER — ONDANSETRON HCL 4 MG/2ML IJ SOLN
INTRAMUSCULAR | Status: AC
  Filled 2015-02-07: qty 2

## 2015-02-07 MED ORDER — LACTATED RINGERS IV SOLN
INTRAVENOUS | Status: DC
  Administered 2015-02-07: 08:00:00 via INTRAVENOUS

## 2015-02-07 MED ORDER — BISACODYL 5 MG PO TBEC
10.0000 mg | DELAYED_RELEASE_TABLET | Freq: Every day | ORAL | Status: DC
  Administered 2015-02-08 – 2015-02-10 (×3): 10 mg via ORAL
  Filled 2015-02-07 (×3): qty 2

## 2015-02-07 MED ORDER — OXYCODONE HCL 5 MG PO TABS
5.0000 mg | ORAL_TABLET | ORAL | Status: DC | PRN
  Administered 2015-02-07 – 2015-02-10 (×9): 10 mg via ORAL
  Filled 2015-02-07 (×10): qty 2

## 2015-02-07 MED ORDER — NEOSTIGMINE METHYLSULFATE 10 MG/10ML IV SOLN
INTRAVENOUS | Status: AC
  Filled 2015-02-07: qty 1

## 2015-02-07 MED ORDER — ROCURONIUM BROMIDE 100 MG/10ML IV SOLN
INTRAVENOUS | Status: DC | PRN
  Administered 2015-02-07: 50 mg via INTRAVENOUS

## 2015-02-07 MED ORDER — VECURONIUM BROMIDE 10 MG IV SOLR
INTRAVENOUS | Status: AC
  Filled 2015-02-07: qty 10

## 2015-02-07 MED ORDER — ACETAMINOPHEN 500 MG PO TABS
1000.0000 mg | ORAL_TABLET | Freq: Four times a day (QID) | ORAL | Status: DC
  Administered 2015-02-07 – 2015-02-08 (×4): 1000 mg via ORAL
  Filled 2015-02-07 (×6): qty 2

## 2015-02-07 MED ORDER — FENTANYL 10 MCG/ML IV SOLN
INTRAVENOUS | Status: DC
  Administered 2015-02-07: 13:00:00 via INTRAVENOUS
  Administered 2015-02-07: 240 ug via INTRAVENOUS
  Administered 2015-02-07: 60 ug via INTRAVENOUS
  Administered 2015-02-08: 165 ug via INTRAVENOUS
  Administered 2015-02-08: 161.7 ug via INTRAVENOUS
  Administered 2015-02-08: 135 ug via INTRAVENOUS
  Filled 2015-02-07 (×2): qty 50

## 2015-02-07 MED ORDER — SACUBITRIL-VALSARTAN 49-51 MG PO TABS
1.0000 | ORAL_TABLET | Freq: Two times a day (BID) | ORAL | Status: DC
  Administered 2015-02-08 – 2015-02-10 (×5): 1 via ORAL
  Filled 2015-02-07 (×10): qty 1

## 2015-02-07 MED ORDER — PROPOFOL 10 MG/ML IV BOLUS
INTRAVENOUS | Status: DC | PRN
  Administered 2015-02-07: 150 mg via INTRAVENOUS

## 2015-02-07 MED ORDER — POTASSIUM CHLORIDE 10 MEQ/50ML IV SOLN
10.0000 meq | Freq: Every day | INTRAVENOUS | Status: DC | PRN
  Filled 2015-02-07: qty 50

## 2015-02-07 MED ORDER — FUROSEMIDE 20 MG PO TABS
20.0000 mg | ORAL_TABLET | Freq: Every day | ORAL | Status: DC
  Administered 2015-02-08 – 2015-02-10 (×3): 20 mg via ORAL
  Filled 2015-02-07 (×3): qty 1

## 2015-02-07 MED ORDER — GLYCOPYRROLATE 0.2 MG/ML IJ SOLN
INTRAMUSCULAR | Status: AC
  Filled 2015-02-07: qty 3

## 2015-02-07 MED ORDER — DIPHENHYDRAMINE HCL 12.5 MG/5ML PO ELIX
12.5000 mg | ORAL_SOLUTION | Freq: Four times a day (QID) | ORAL | Status: DC | PRN
  Filled 2015-02-07: qty 5

## 2015-02-07 MED ORDER — HYDROMORPHONE HCL 1 MG/ML IJ SOLN
0.2500 mg | INTRAMUSCULAR | Status: DC | PRN
  Administered 2015-02-07: 0.25 mg via INTRAVENOUS
  Administered 2015-02-07 (×2): 0.5 mg via INTRAVENOUS
  Administered 2015-02-07: 0.25 mg via INTRAVENOUS

## 2015-02-07 MED ORDER — SPIRONOLACTONE 25 MG PO TABS
25.0000 mg | ORAL_TABLET | Freq: Every day | ORAL | Status: DC
  Administered 2015-02-08 – 2015-02-10 (×3): 25 mg via ORAL
  Filled 2015-02-07 (×3): qty 1

## 2015-02-07 MED ORDER — NALOXONE HCL 0.4 MG/ML IJ SOLN: 0.4000 mg | INTRAMUSCULAR | Status: DC | PRN

## 2015-02-07 MED ORDER — HYDROMORPHONE HCL 1 MG/ML IJ SOLN
INTRAMUSCULAR | Status: AC
  Filled 2015-02-07: qty 1

## 2015-02-07 MED ORDER — PROMETHAZINE HCL 25 MG/ML IJ SOLN: 6.2500 mg | INTRAMUSCULAR | Status: DC | PRN

## 2015-02-07 MED ORDER — ROCURONIUM BROMIDE 50 MG/5ML IV SOLN
INTRAVENOUS | Status: AC
  Filled 2015-02-07: qty 1

## 2015-02-07 MED ORDER — PANTOPRAZOLE SODIUM 40 MG PO TBEC
40.0000 mg | DELAYED_RELEASE_TABLET | Freq: Every day | ORAL | Status: DC
  Administered 2015-02-07 – 2015-02-10 (×4): 40 mg via ORAL
  Filled 2015-02-07 (×4): qty 1

## 2015-02-07 MED ORDER — DIPHENHYDRAMINE HCL 50 MG/ML IJ SOLN: 12.5000 mg | Freq: Four times a day (QID) | INTRAMUSCULAR | Status: DC | PRN

## 2015-02-07 MED ORDER — SENNOSIDES-DOCUSATE SODIUM 8.6-50 MG PO TABS
1.0000 | ORAL_TABLET | Freq: Every day | ORAL | Status: DC
  Administered 2015-02-07 – 2015-02-09 (×3): 1 via ORAL
  Filled 2015-02-07 (×4): qty 1

## 2015-02-07 MED ORDER — LIDOCAINE HCL (CARDIAC) 20 MG/ML IV SOLN
INTRAVENOUS | Status: DC | PRN
  Administered 2015-02-07: 50 mg via INTRAVENOUS

## 2015-02-07 MED ORDER — ASPIRIN EC 81 MG PO TBEC
81.0000 mg | DELAYED_RELEASE_TABLET | Freq: Every day | ORAL | Status: DC
  Administered 2015-02-08 – 2015-02-10 (×3): 81 mg via ORAL
  Filled 2015-02-07 (×3): qty 1

## 2015-02-07 MED ORDER — ONDANSETRON HCL 4 MG/2ML IJ SOLN: 4.0000 mg | Freq: Four times a day (QID) | INTRAMUSCULAR | Status: DC | PRN

## 2015-02-07 MED ORDER — MIDAZOLAM HCL 2 MG/2ML IJ SOLN: 0.5000 mg | Freq: Once | INTRAMUSCULAR | Status: DC | PRN

## 2015-02-07 MED ORDER — ONDANSETRON HCL 4 MG/2ML IJ SOLN
4.0000 mg | Freq: Four times a day (QID) | INTRAMUSCULAR | Status: DC | PRN
  Administered 2015-02-08 – 2015-02-09 (×2): 4 mg via INTRAVENOUS
  Filled 2015-02-07 (×2): qty 2

## 2015-02-07 MED ORDER — CARVEDILOL 6.25 MG PO TABS
18.7500 mg | ORAL_TABLET | Freq: Two times a day (BID) | ORAL | Status: DC
  Administered 2015-02-07 – 2015-02-10 (×5): 18.75 mg via ORAL
  Filled 2015-02-07 (×8): qty 1

## 2015-02-07 MED ORDER — MIDAZOLAM HCL 5 MG/5ML IJ SOLN
INTRAMUSCULAR | Status: DC | PRN
  Administered 2015-02-07: 2 mg via INTRAVENOUS

## 2015-02-07 MED ORDER — SODIUM CHLORIDE 0.9 % IJ SOLN: 9.0000 mL | INTRAMUSCULAR | Status: DC | PRN

## 2015-02-07 MED ORDER — ACETAMINOPHEN 160 MG/5ML PO SOLN
1000.0000 mg | Freq: Four times a day (QID) | ORAL | Status: DC
  Filled 2015-02-07: qty 40

## 2015-02-07 MED ORDER — HEMOSTATIC AGENTS (NO CHARGE) OPTIME
TOPICAL | Status: DC | PRN
  Administered 2015-02-07: 1 via TOPICAL

## 2015-02-07 MED ORDER — FENTANYL CITRATE 0.05 MG/ML IJ SOLN
INTRAMUSCULAR | Status: AC
  Administered 2015-02-07: 50 ug
  Filled 2015-02-07: qty 2

## 2015-02-07 MED ORDER — PROPOFOL 10 MG/ML IV BOLUS
INTRAVENOUS | Status: AC
  Filled 2015-02-07: qty 20

## 2015-02-07 MED ORDER — MEPERIDINE HCL 25 MG/ML IJ SOLN: 6.2500 mg | INTRAMUSCULAR | Status: DC | PRN

## 2015-02-07 MED ORDER — MIDAZOLAM HCL 2 MG/2ML IJ SOLN
INTRAMUSCULAR | Status: AC
  Administered 2015-02-07: 1 mg via INTRAVENOUS
  Filled 2015-02-07: qty 2

## 2015-02-07 MED ORDER — DEXTROSE 5 % IV SOLN
1.5000 g | Freq: Two times a day (BID) | INTRAVENOUS | Status: AC
  Administered 2015-02-07 – 2015-02-08 (×2): 1.5 g via INTRAVENOUS
  Filled 2015-02-07 (×3): qty 1.5

## 2015-02-07 MED ORDER — SODIUM CHLORIDE 0.9 % IV SOLN
INTRAVENOUS | Status: DC
  Administered 2015-02-07: 15:00:00 via INTRAVENOUS

## 2015-02-07 MED ORDER — TRAMADOL HCL 50 MG PO TABS
50.0000 mg | ORAL_TABLET | Freq: Four times a day (QID) | ORAL | Status: DC | PRN
  Administered 2015-02-10: 100 mg via ORAL
  Filled 2015-02-07: qty 2

## 2015-02-07 MED ORDER — MIDAZOLAM HCL 2 MG/2ML IJ SOLN
INTRAMUSCULAR | Status: AC
  Filled 2015-02-07: qty 2

## 2015-02-07 MED ORDER — LACTATED RINGERS IV SOLN
INTRAVENOUS | Status: DC | PRN
  Administered 2015-02-07: 09:00:00 via INTRAVENOUS

## 2015-02-07 MED ORDER — LIDOCAINE HCL (CARDIAC) 20 MG/ML IV SOLN
INTRAVENOUS | Status: AC
  Filled 2015-02-07: qty 5

## 2015-02-07 SURGICAL SUPPLY — 69 items
BENZOIN TINCTURE PRP APPL 2/3 (GAUZE/BANDAGES/DRESSINGS) IMPLANT
CANISTER SUCTION 2500CC (MISCELLANEOUS) ×8 IMPLANT
CATH THORACIC 28FR (CATHETERS) ×4 IMPLANT
CATH THORACIC 36FR (CATHETERS) IMPLANT
CATH THORACIC 36FR RT ANG (CATHETERS) IMPLANT
CLIP TI MEDIUM 6 (CLIP) ×4 IMPLANT
CONN ST 1/4X3/8  BEN (MISCELLANEOUS)
CONN ST 1/4X3/8 BEN (MISCELLANEOUS) IMPLANT
CONN Y 3/8X3/8X3/8  BEN (MISCELLANEOUS)
CONN Y 3/8X3/8X3/8 BEN (MISCELLANEOUS) IMPLANT
CONT SPEC 4OZ CLIKSEAL STRL BL (MISCELLANEOUS) IMPLANT
COVER SURGICAL LIGHT HANDLE (MISCELLANEOUS) ×8 IMPLANT
DERMABOND ADHESIVE PROPEN (GAUZE/BANDAGES/DRESSINGS) ×2
DERMABOND ADVANCED (GAUZE/BANDAGES/DRESSINGS)
DERMABOND ADVANCED .7 DNX12 (GAUZE/BANDAGES/DRESSINGS) IMPLANT
DERMABOND ADVANCED .7 DNX6 (GAUZE/BANDAGES/DRESSINGS) ×2 IMPLANT
DRAIN CHANNEL 28F RND 3/8 FF (WOUND CARE) IMPLANT
DRAIN CHANNEL 32F RND 10.7 FF (WOUND CARE) IMPLANT
DRAPE LAPAROSCOPIC ABDOMINAL (DRAPES) ×4 IMPLANT
DRAPE SLUSH/WARMER DISC (DRAPES) ×4 IMPLANT
DRILL BIT 7/64X5 (BIT) IMPLANT
ELECT BLADE 4.0 EZ CLEAN MEGAD (MISCELLANEOUS) ×4
ELECT REM PT RETURN 9FT ADLT (ELECTROSURGICAL) ×4
ELECTRODE BLDE 4.0 EZ CLN MEGD (MISCELLANEOUS) ×2 IMPLANT
ELECTRODE REM PT RTRN 9FT ADLT (ELECTROSURGICAL) ×2 IMPLANT
GAUZE SPONGE 4X4 12PLY STRL (GAUZE/BANDAGES/DRESSINGS) ×4 IMPLANT
GLOVE BIOGEL PI IND STRL 6 (GLOVE) ×4 IMPLANT
GLOVE BIOGEL PI INDICATOR 6 (GLOVE) ×4
GLOVE EUDERMIC 7 POWDERFREE (GLOVE) ×8 IMPLANT
GOWN STRL REUS W/ TWL LRG LVL3 (GOWN DISPOSABLE) ×4 IMPLANT
GOWN STRL REUS W/ TWL XL LVL3 (GOWN DISPOSABLE) ×2 IMPLANT
GOWN STRL REUS W/TWL LRG LVL3 (GOWN DISPOSABLE) ×4
GOWN STRL REUS W/TWL XL LVL3 (GOWN DISPOSABLE) ×2
HEMOSTAT SURGICEL 2X14 (HEMOSTASIS) ×4 IMPLANT
IMPL BIOMEC 54 ~~LOC~~ (Pacemaker) ×4 IMPLANT
IMPLANT BIOMEC 54 ~~LOC~~ (Pacemaker) ×8 IMPLANT
KIT BASIN OR (CUSTOM PROCEDURE TRAY) ×4 IMPLANT
KIT ROOM TURNOVER OR (KITS) ×4 IMPLANT
KIT SUCTION CATH 14FR (SUCTIONS) ×4 IMPLANT
NS IRRIG 1000ML POUR BTL (IV SOLUTION) ×16 IMPLANT
PACK CHEST (CUSTOM PROCEDURE TRAY) ×4 IMPLANT
PAD ARMBOARD 7.5X6 YLW CONV (MISCELLANEOUS) ×8 IMPLANT
SEALANT SURG COSEAL 4ML (VASCULAR PRODUCTS) IMPLANT
SEALANT SURG COSEAL 8ML (VASCULAR PRODUCTS) IMPLANT
SUT PROLENE 3 0 SH DA (SUTURE) IMPLANT
SUT SILK  1 MH (SUTURE) ×4
SUT SILK 1 MH (SUTURE) ×4 IMPLANT
SUT SILK 1 TIES 10X30 (SUTURE) ×4 IMPLANT
SUT SILK 2 0 SH (SUTURE) ×8 IMPLANT
SUT SILK 2 0SH CR/8 30 (SUTURE) ×4 IMPLANT
SUT SILK 3 0SH CR/8 30 (SUTURE) IMPLANT
SUT VIC AB 1 CTX 36 (SUTURE) ×2
SUT VIC AB 1 CTX36XBRD ANBCTR (SUTURE) ×2 IMPLANT
SUT VIC AB 2-0 CT1 27 (SUTURE)
SUT VIC AB 2-0 CT1 TAPERPNT 27 (SUTURE) IMPLANT
SUT VIC AB 2-0 CTX 36 (SUTURE) ×4 IMPLANT
SUT VIC AB 2-0 UR6 27 (SUTURE) IMPLANT
SUT VIC AB 3-0 MH 27 (SUTURE) IMPLANT
SUT VIC AB 3-0 SH 27 (SUTURE) ×2
SUT VIC AB 3-0 SH 27X BRD (SUTURE) ×2 IMPLANT
SUT VIC AB 3-0 X1 27 (SUTURE) ×8 IMPLANT
SUT VICRYL 2 TP 1 (SUTURE) ×4 IMPLANT
SYSTEM SAHARA CHEST DRAIN ATS (WOUND CARE) ×4 IMPLANT
TIP APPLICATOR SPRAY EXTEND 16 (VASCULAR PRODUCTS) IMPLANT
TOWEL OR 17X24 6PK STRL BLUE (TOWEL DISPOSABLE) ×4 IMPLANT
TOWEL OR 17X26 10 PK STRL BLUE (TOWEL DISPOSABLE) ×4 IMPLANT
TRAP SPECIMEN MUCOUS 40CC (MISCELLANEOUS) IMPLANT
TRAY FOLEY CATH 14FRSI W/METER (CATHETERS) IMPLANT
WATER STERILE IRR 1000ML POUR (IV SOLUTION) ×8 IMPLANT

## 2015-02-07 NOTE — H&P (Signed)
Roger Crosby [address]       [place],Roger Crosby [id number]             [phone]      Cardiothoracic Surgery History and Physical   PCP is No PCP Per Patient Referring Provider is Deboraha Sprang, MD  Chief Complaint  Patient presents with  . Congestive Heart Failure    eval for lead placement    HPI:  The patient is a 54 year old gentleman with a history of HTN, stage 2 CKD, hypercholesterolemia, non-obstructive coronary disease and non-ischemic cardiomyopathy with an EF of 25-30% with a dilated LV by echo 09/2014. He does give a history of alcohol use and crack cocaine use. He underwent insertion of a dual chamber ICD on 12/14/2014 by Dr. Caryl Comes but he could not get an LV lead placed. He has dyspnea with mild exertion even walking on a level surface as well as some peripheral edema.  Past Medical History  Diagnosis Date  . HTN (hypertension)   . GERD (gastroesophageal reflux disease)   . LBP (low back pain)   . LBBB (left bundle branch block)     dx 2011  . Hx of echocardiogram     echo 5/11: EF 45-50%, mild HK of Ap AS wall, ap Ant wall and true apex (LAD distribution), mild LAE  . NICM (nonischemic cardiomyopathy)   . CAD (coronary artery disease)     nonobstructive by [facility] 6/11: oOM1 40%, mild luminal irregs elsewhere  . Systolic heart failure   . AICD (automatic cardioverter/defibrillator) present     a. Unsuccessful LV lead placement 11/2014  . High cholesterol   . CHF (congestive heart failure)   . Sleep apnea   . CKD (chronic kidney disease)     Past Surgical History  Procedure Laterality Date  . Left heart catheterization with coronary angiogram N/A 08/04/2013    Procedure: LEFT HEART CATHETERIZATION WITH CORONARY ANGIOGRAM; Surgeon: Larey Dresser, MD; Location: [facility] CATH LAB; Service: Cardiovascular; Laterality: N/A;  . Cardiac defibrillator placement  12/14/2014     with failed LV lead placeemnt  . Cardiac catheterization Left 07/2013    with angiogram  . Bi-ventricular implantable cardioverter defibrillator N/A 12/14/2014    Procedure: BI-VENTRICULAR IMPLANTABLE CARDIOVERTER DEFIBRILLATOR (CRT-D); Surgeon: Deboraha Sprang, MD; Location: [facility] CATH LAB; Service: Cardiovascular; Laterality: N/A;    Family History  Problem Relation Age of Onset  . Heart attack Mother     in 54's  . Heart disease Mother     ischemic  . Heart attack Father     in 42's  . Heart disease Father     ischemic  . Congestive Heart Failure Mother   . Diabetes Mother   . Hypertension Mother   . Heart Problems Father   . Crohn's disease Sister   . Prostate cancer Brother   . HIV/AIDS Brother     Social History History  Substance Use Topics  . Smoking status: Former Smoker -- 0.12 packs/day for 25 years    Types: Cigarettes    Quit date: 07/31/2014  . Smokeless tobacco: Never Used  . Alcohol Use: Yes     Comment: 12/14/2014 "might have a mixed drink once/month"    Current Outpatient Prescriptions  Medication Sig Dispense Refill  . aspirin EC 81 MG tablet Take 1 tablet (81 mg total) by mouth daily.    . carvedilol (COREG) 12.5 MG tablet Take 1.5 tablets (18.75 mg total) by mouth  2 (two) times daily with a meal. 90 tablet 3  . furosemide (LASIX) 20 MG tablet Take 1 tablet (20 mg total) by mouth daily. 30 tablet 6  . HYDROcodone-acetaminophen (NORCO) 5-325 MG per tablet Take 2 tablets by mouth every 6 (six) hours as needed for severe pain. 20 tablet 0  . omeprazole (PRILOSEC) 20 MG capsule TAKE ONE CAPSULE BY MOUTH EVERY DAY 30 capsule 0  . Sacubitril-Valsartan (ENTRESTO) 49-51 MG TABS Take 1 tablet by mouth 2 (two) times daily. 60 tablet 3  . spironolactone (ALDACTONE) 25 MG tablet Take 1 tablet (25 mg total) by mouth daily. 30  tablet 3   No current facility-administered medications for this visit.    No Known Allergies  Review of Systems  Constitutional: Positive for fatigue.  HENT: Negative.  Eyes: Negative.  Respiratory: Positive for shortness of breath.  Cardiovascular: Positive for leg swelling. Negative for chest pain.   Denies orthopnea and PND  Gastrointestinal: Negative.  Endocrine: Negative.  Genitourinary: Negative.  Musculoskeletal: Negative.  Skin: Negative.  Neurological: Negative.  Hematological: Negative.  Psychiatric/Behavioral: Negative.    BP 130/89 mmHg  Pulse 75  Resp 16  Ht 5\' 8"  (1.727 m)  Wt 235 lb (106.595 kg)  BMI 35.74 kg/m2  SpO2 98% Physical Exam  Constitutional: He is oriented to person, place, and time. He appears well-developed and well-nourished. No distress.  HENT:  Head: Normocephalic and atraumatic.  Mouth/Throat: Oropharynx is clear and moist.  Eyes: Pupils are equal, round, and reactive to light.  Neck: Normal range of motion. Neck supple. No JVD present. No thyromegaly present.  Cardiovascular: Normal rate, regular rhythm, normal heart sounds and intact distal pulses.  No murmur heard. Pulmonary/Chest: Effort normal and breath sounds normal. No respiratory distress.  ICD generator left anterior chest in subcutaneous pocket  Abdominal: Soft. Bowel sounds are normal. He exhibits no distension and no mass. There is no tenderness.  Musculoskeletal: Normal range of motion. He exhibits no edema.  Lymphadenopathy:   He has no cervical adenopathy.  Neurological: He is alert and oriented to person, place, and time. He has normal strength. No cranial nerve deficit or sensory deficit.  Skin: Skin is warm and dry.  Psychiatric: He has a normal mood and affect.     Impression:  He has non-ischemic cardiomyopathy with NYHA class 3 symptoms of heart failure. A percutaneous LV pacing lead could not be placed due to anatomical constraints.  I agree with the potential benefit of placing an epicardial lead via a small left thoracotomy. I discussed the procedure with the patient and his family including alternatives, benefits, and risks including but not limited to bleeding, infection, injury to the heart, malfunction of the lead or ICD system requiring revision and he understands and agrees to proceed.  Plan:      Placement of LV epicardial lead via left thoracotomy and revision of his ICD system.

## 2015-02-07 NOTE — Op Note (Signed)
CARDIOTHORACIC SURGERY OPERATIVE NOTE  02/07/2015 Roger Crosby [id number]  Surgeon:  Gaye Pollack, MD  First Assistant: Jadene Pierini, PA-C   Preoperative Diagnosis:  Non-ischemic cardiomyopathy  Postoperative Diagnosis: same  Procedure:  1. Left anterolateral thoracotomy 2. Insertion of 2 left ventricular epicardial pacing leads 3. Revision of Biventricular pacing system  Anesthesia:  General Endotracheal   Clinical History/Surgical Indication:  The patient is a 54 year old gentleman with a history of HTN, stage 2 CKD, hypercholesterolemia, non-obstructive coronary disease and non-ischemic cardiomyopathy with an EF of 25-30% with a dilated LV by echo 09/2014. He does give a history of alcohol use and crack cocaine use. He underwent insertion of a dual chamber ICD on 12/14/2014 by Dr. Caryl Comes but he could not get an LV lead placed. He has dyspnea with mild exertion even walking on a level surface as well as some peripheral edema. He has non-ischemic cardiomyopathy with NYHA class 3 symptoms of heart failure. A percutaneous LV pacing lead could not be placed due to anatomical constraints. I agree with the potential benefit of placing an epicardial lead via a small left thoracotomy. I discussed the procedure with the patient and his family including alternatives, benefits, and risks including but not limited to bleeding, infection, injury to the heart, malfunction of the lead or ICD system requiring revision and he understands and agrees to proceed.  Preparation:  The patient was seen in the preoperative holding area and the correct patient, correct operation, correct operative side were confirmed with the patient after reviewing the medical record. The consent was signed by me. Preoperative antibiotics were given.  The patient was taken back to the operating room and positioned supine on the operating room table. After being placed under general endotracheal anesthesia by the anesthesia team  using a double lumen tube a roll was place beneath the left back to tilt the left side up slightly. The chest was prepped with betadine soap and solution.  A surgical time-out was taken and the correct patient,operative side, and operative procedure were confirmed with the nursing and anesthesia staff.   Operative Procedure:  A short anterolateral thoracotomy incision was made below the pectoralis border. The subcutaneous tissue was divided using electrocautery. The serratus muscle was split along its fibers. The pleural space was entered through the 6th intercostal space. The pericardium was immediately adjacent to the chest wall due to cardiac enlargement. The pericardium was opened. A site was chosen for pacing lead insertion on the mid-lateral wall. A [employer] pacing lead was screwed into the myocardium ( model [id number], SN [id number]). It was tested and the Rs was 23.3 mv. Impedence was 660. Threshold was 0.8@ 0.4ms. Another lead of the same type was screwed into the myocardium about 2 cm away from the first ( model [id number], SN [id number]). Testing showed an Rs of 16.2 mv, Impedence of 749, and a threshold of 1.9@ 0.5 ms. The generator pocket was opened through the prior incision. There was a small amout of serous fluid present in the pocket. The generator was removed. The two LV epicardial leads were brought out through the interspace and tunneled along the subcutaneous tissue of the chest wall up to the generator pocket. The first lead ([id number]) was connected to the generator and the other lead was capped. The pocket was enlarged slightly inferiorly. The generator was replaced in the pocket and the excess lead coiled behind it. The system was tested by the [facility] [employer]. Jude representative and was functioning well with significant  narrowing of the QRS complex. The pocket was irrigated with saline. The subcutaneous tissue was closed with 3-0 vicryl continuous suture. The skin was closed with 4-0 vicryl subcuticular  suture. The thoracotomy incision was closed using continuous #0 vicryl suture for the serratus muscle, 2-0 vicryl subcutaneous suture and 3-0 vicryl subcuticular suture. Dermabond was applied.  All sponge, needle, and instrument counts were reported correct at the end of the case. Dry sterile dressings were placed over the incisions and around the chest tubes which were connected to pleurevac suction. The patient was extubated and transported to the PACU in satisfactory and stable condition.

## 2015-02-07 NOTE — Progress Notes (Signed)
CT surgery p.m. Rounds  Patient stable after left mini thoracotomy for placement of LV epicardial wire and revision of AICD  Regular rhythm, breathing comfortably, stable blood pressure Minimal chest tube drainage without airleak Patient using PCA but some residual discomfort-because of stage II CKD will not use Toradol but continue with full dose fentanyl PCA

## 2015-02-07 NOTE — Anesthesia Procedure Notes (Addendum)
Anesthesia Procedure Note CVP: Timeout, sterile prep, drape, FBP L neck.  Trendelenburg position.  1% lido local, finder and trocar LIJ 1st pass with US guidance.  Unable to pass wire, despite good flow and [place] confirmation of vein.  Re-prep, drape, FBP R neck, 1% lido local, finder and trocar RIJ 1st pass with US guidance, 2 lumen placed over J wire. Biopatch and sterile dressing on.  Patient tolerated well.  VSS.  Jenita Seashore, MD [phone]   Procedure Name: Intubation Date/Time: 02/07/2015 9:49 AM Performed by: Eligha Bridegroom Pre-anesthesia Checklist: Patient identified, Timeout performed, Emergency Drugs available, Suction available and Patient being monitored Patient Re-evaluated:Patient Re-evaluated prior to inductionOxygen Delivery Method: Circle system utilized Preoxygenation: Pre-oxygenation with 100% oxygen Intubation Type: IV induction Ventilation: Mask ventilation without difficulty Laryngoscope Size: Mac and 4 Grade View: Grade II Endobronchial tube: Left and 39 Fr Number of attempts: 1 Airway Equipment and Method: Stylet Tube secured with: Tape Dental Injury: Teeth and Oropharynx as per pre-operative assessment

## 2015-02-07 NOTE — Transfer of Care (Signed)
Immediate Anesthesia Transfer of Care Note  Patient: Roger Crosby  Procedure(s) Performed: Procedure(s): THORACOTOMY MAJOR (Left) INSERTION OF LV EPICARDIAL PACING LEAD (N/A)  Patient Location: PACU  Anesthesia Type:General  Level of Consciousness: awake, alert  and oriented  Airway & Oxygen Therapy: Patient Spontanous Breathing and Patient connected to nasal cannula oxygen  Post-op Assessment: Report given to RN and Post -op Vital signs reviewed and stable  Post vital signs: Reviewed and stable  Last Vitals:  Filed Vitals:   02/07/15 1209  BP: 125/82  Pulse: 77  Temp:   Resp: 16    Complications: No apparent anesthesia complications

## 2015-02-07 NOTE — Anesthesia Postprocedure Evaluation (Signed)
  Anesthesia Post-op Note  Patient: Roger Crosby  Procedure(s) Performed: Procedure(s): THORACOTOMY MAJOR (Left) INSERTION OF LV EPICARDIAL PACING LEAD (N/A)  Patient Location: PACU  Anesthesia Type:General  Level of Consciousness: awake, alert , oriented and patient cooperative  Airway and Oxygen Therapy: Patient Spontanous Breathing and Patient connected to nasal cannula oxygen  Post-op Pain: mild  Post-op Assessment: Post-op Vital signs reviewed, Patient's Cardiovascular Status Stable, Respiratory Function Stable, Patent Airway, No signs of Nausea or vomiting and Pain level controlled  Post-op Vital Signs: Reviewed and stable  Last Vitals:  Filed Vitals:   02/07/15 1408  BP:   Pulse:   Temp: 36.3 C  Resp:     Complications: No apparent anesthesia complications

## 2015-02-07 NOTE — Progress Notes (Signed)
ELECTROPHYSIOLOGY ROUNDING NOTE    SUBJECTIVE: The patient is doing well today.  At this time, he denies chest pain, shortness of breath, or any new concerns.  S/p epicardial LV lead placement 02/07/15.  CURRENT MEDICATIONS: . acetaminophen  1,000 mg Oral 4 times per day   Or  . acetaminophen (TYLENOL) oral liquid 160 mg/5 mL  1,000 mg Oral 4 times per day  . aspirin EC  81 mg Oral Daily  . bisacodyl  10 mg Oral Daily  . carvedilol  18.75 mg Oral BID WC  . cefUROXime (ZINACEF)  IV  1.5 g Intravenous Q12H  . fentaNYL   Intravenous 6 times per day  . furosemide  20 mg Oral Daily  . pantoprazole  40 mg Oral Daily  . sacubitril-valsartan  1 tablet Oral BID  . senna-docusate  1 tablet Oral QHS  . spironolactone  25 mg Oral Daily   . sodium chloride 50 mL/hr at 02/08/15 0500    OBJECTIVE: Telemetry reveals sinus rhythm with ventricular pacing  Physical Exam: Filed Vitals:   02/08/15 0400 02/08/15 0404 02/08/15 0500 02/08/15 0600  BP:   128/59 131/66  Pulse:   87 89  Temp:  98.2 F (36.8 C)    TempSrc:  Oral    Resp: 14  17 23   Height:      Weight:      SpO2: 98%  96% 96%    Intake/Output Summary (Last 24 hours) at 02/08/15 0655 Last data filed at 02/08/15 0500  Gross per 24 hour  Intake 2327.5 ml  Output   1396 ml  Net  931.5 ml    GEN- The patient is well appearing, alert and oriented x 3 today.   HEENT: normocephalic, atraumatic; sclera clear, conjunctiva pink; hearing intact; oropharynx clear; neck supple, no JVP Lymph- no cervical lymphadenopathy Lungs- Clear to ausculation bilaterally, normal work of breathing.  No wheezes, rales, rhonchi Heart- Regular rate and rhythm, no murmurs, rubs or gallops  GI- soft, non-tender, non-distended, bowel sounds present, no hepatosplenomegaly Extremities- no clubbing, cyanosis, 1+ BLE MS- no significant deformity or atrophy Skin- warm and dry, no rash or lesion, chest tube in place Psych- euthymic mood, full  affect Neuro- strength and sensation are intact   LABS: Basic Metabolic Panel:  Recent Labs  02/05/15 1400 02/08/15 0305  NA 140 137  K 4.1 3.8  CL 108 105  CO2 26 25  GLUCOSE 82 116*  BUN 16 8  CREATININE 1.36* 1.16  CALCIUM 9.1 7.9*   Liver Function Tests:  Recent Labs  02/05/15 1400  AST 14  ALT 19  ALKPHOS 67  BILITOT 0.6  PROT 6.7  ALBUMIN 3.7   CBC:  Recent Labs  02/05/15 1400 02/08/15 0305  WBC 8.3 12.6*  HGB 14.1 12.9*  HCT 43.5 39.9  MCV 80.0 80.1  PLT 278 262    RADIOLOGY: Dg Chest 2 View 02/05/2015   CLINICAL DATA:  Preop for lead placement. Increasing shortness of breath. Congestive heart failure.  EXAM: CHEST - 2 VIEW  COMPARISON:  Two-view chest x-ray 12/15/2014.  FINDINGS: A dual lead pacemaker/ AICD is in place. The heart size is normal. The lungs are clear. The visualized soft tissues and bony thorax are unremarkable.  IMPRESSION: 1. No acute cardiopulmonary disease. 2. Stable and satisfactory positioning of the pacing wires.   Electronically Signed   By: San Morelle M.D.   On: 02/05/2015 15:56   Dg Chest Port 1 View 02/07/2015  CLINICAL DATA:  Status post ICD placement.  EXAM: PORTABLE CHEST - 1 VIEW  COMPARISON:  02/05/2015  FINDINGS: There is a new right jugular central venous catheter with tip projecting over the lower SVC. A left-sided chest tube has been placed with tip near the medial apex. There is a left-sided ICD with leads terminating over the expected right atrium and right ventricle. Two epicardial leads have also been placed in the region of the ventricular apex. Lung volumes are diminished with mild pulmonary vascular congestion. No confluent airspace opacity or overt pulmonary edema is identified. No sizable pleural effusion or pneumothorax is identified. Cardiac silhouette appears mildly enlarged, accentuated by portable AP technique and shallow inspiration.  IMPRESSION: 1. Interval postoperative changes as above. 2. Low lung  volumes.   Electronically Signed   By: Logan Bores   On: 02/07/2015 14:18    ASSESSMENT AND PLAN:  Active Problems:   S/P ICD (internal cardiac defibrillator) procedure  1.  Non-ischemic cardiomyopathy/chronic systolic heart failure Unable to place LV lead endocardially at time of device implant 11/2014 Appreciate Dr Vivi Martens help with epicardial LV lead placement 02/07/15 Device interrogation this morning reviewed and normal CXR demonstrates stable lead placement Will check EKG this morning  Will plan follow up in our office 02/21/15  Electrophysiology team to see as needed while here. Please call with questions.   Chanetta Marshall, NP 02/08/2015 6:55 AM

## 2015-02-07 NOTE — Anesthesia Preprocedure Evaluation (Addendum)
Anesthesia Evaluation  Patient identified by MRN, date of birth, ID band Patient awake    Reviewed: Allergy & Precautions, NPO status , Patient's Chart, lab work & pertinent test results, reviewed documented beta blocker date and time   History of Anesthesia Complications Negative for: history of anesthetic complications  Airway Mallampati: II  TM Distance: >3 FB Neck ROM: Full    Dental  (+) Dental Advisory Given   Pulmonary shortness of breath, former smoker (quit 2015),  breath sounds clear to auscultation        Cardiovascular hypertension, Pt. on medications and Pt. on home beta blockers + CAD (non-obstructive by cath '14) and +CHF + dysrhythmias Rhythm:Regular Rate:Normal  '15 ECHO: EF 09-23%, grade 1 diastolic dysfunction, valves OK   Neuro/Psych negative neurological ROS     GI/Hepatic Neg liver ROS, GERD-  Medicated and Controlled,  Endo/Other  Morbid obesity  Renal/GU Renal InsufficiencyRenal disease (creat 1.36)     Musculoskeletal   Abdominal (+) + obese,   Peds  Hematology   Anesthesia Other Findings   Reproductive/Obstetrics                          Anesthesia Physical Anesthesia Plan  ASA: III  Anesthesia Plan: General   Post-op Pain Management:    Induction: Intravenous  Airway Management Planned: Oral ETT and Double Lumen EBT  Additional Equipment: Arterial line, CVP and Ultrasound Guidance Line Placement  Intra-op Plan:   Post-operative Plan: Extubation in OR  Informed Consent: I have reviewed the patients History and Physical, chart, labs and discussed the procedure including the risks, benefits and alternatives for the proposed anesthesia with the patient or authorized representative who has indicated his/her understanding and acceptance.   Dental advisory given  Plan Discussed with: CRNA and Surgeon  Anesthesia Plan Comments: (Plan routine monitors, A line,  CVP, GETA with DLT)        Anesthesia Quick Evaluation

## 2015-02-07 NOTE — Progress Notes (Signed)
Called Dr.Jackson for sign out  

## 2015-02-07 NOTE — Progress Notes (Signed)
Dr.Jackson notified of BP and ABP. Orders received to go by cuff pressures and cont to treat pain at this time. Will cont to monitor.

## 2015-02-07 NOTE — Brief Op Note (Signed)
02/07/2015      [address]       [place] [address]             [phone]     02/07/2015  11:54 AM  PATIENT:  Roger Crosby  54 y.o. male  PRE-OPERATIVE DIAGNOSIS:  CHF  POST-OPERATIVE DIAGNOSIS:  CHF  PROCEDURE:  Procedure(s): THORACOTOMY MAJOR INSERTION OF LV EPICARDIAL PACING LEADX2  SURGEON:  Surgeon(s): Gaye Pollack, MD  PHYSICIAN ASSISTANT: Shardee Dieu PA-C  ANESTHESIA:   general  SPECIMEN:  No Specimen  DISPOSITION OF SPECIMEN:  Pathology  DRAINS: 1 Chest Tube(s) in the LEFT HEMITHORAX   PATIENT CONDITION:  PACU - hemodynamically stable.  PRE-OPERATIVE WEIGHT: 104kg  EBL: MINIMAL  COMPLICATIONS: NO KNOWN

## 2015-02-07 NOTE — Progress Notes (Signed)
Darlina Guys from [facility] notified that pt is about ready to go back for surgery.  She will check back after surgeon has seen pt and will turn off device in the OR.

## 2015-02-07 NOTE — Progress Notes (Signed)
Report to Angel RN

## 2015-02-07 NOTE — Progress Notes (Signed)
Pt reports that blood band was too tight so pt removed it, sample redrawn and sent to lab.

## 2015-02-07 NOTE — Progress Notes (Addendum)
Dr. Prescott Gum aware of patient having trouble voiding. Will place foley if no void by 2000.Roger Crosby

## 2015-02-07 NOTE — Interval H&P Note (Signed)
History and Physical Interval Note:  02/07/2015 9:07 AM  Roger Crosby  has presented today for surgery, with the diagnosis of CHF  The various methods of treatment have been discussed with the patient and family. After consideration of risks, benefits and other options for treatment, the patient has consented to  Procedure(s): THORACOTOMY MAJOR (Left) INSERTION OF LV EPICARDIAL PACING LEADS (N/A) as a surgical intervention .  The patient's history has been reviewed, patient examined, no change in status, stable for surgery.  I have reviewed the patient's chart and labs.  Questions were answered to the patient's satisfaction.     Gaye Pollack

## 2015-02-08 DIAGNOSIS — I429 Cardiomyopathy, unspecified: Secondary | ICD-10-CM

## 2015-02-08 LAB — CBC
HCT: 39.9 % (ref 39.0–52.0)
Hemoglobin: 12.9 g/dL — ABNORMAL LOW (ref 13.0–17.0)
MCH: 25.9 pg — ABNORMAL LOW (ref 26.0–34.0)
MCHC: 32.3 g/dL (ref 30.0–36.0)
MCV: 80.1 fL (ref 78.0–100.0)
Platelets: 262 10*3/uL (ref 150–400)
RBC: 4.98 MIL/uL (ref 4.22–5.81)
RDW: 15.1 % (ref 11.5–15.5)
WBC: 12.6 10*3/uL — ABNORMAL HIGH (ref 4.0–10.5)

## 2015-02-08 LAB — BLOOD GAS, ARTERIAL
Acid-Base Excess: 1.1 mmol/L (ref 0.0–2.0)
Bicarbonate: 25.6 mEq/L — ABNORMAL HIGH (ref 20.0–24.0)
DRAWN BY: 252031
O2 Content: 2 L/min
O2 SAT: 95.4 %
PCO2 ART: 43.4 mmHg (ref 35.0–45.0)
Patient temperature: 98.6
TCO2: 26.9 mmol/L (ref 0–100)
pH, Arterial: 7.388 (ref 7.350–7.450)
pO2, Arterial: 75.2 mmHg — ABNORMAL LOW (ref 80.0–100.0)

## 2015-02-08 LAB — BASIC METABOLIC PANEL
ANION GAP: 7 (ref 5–15)
BUN: 8 mg/dL (ref 6–23)
CALCIUM: 7.9 mg/dL — AB (ref 8.4–10.5)
CO2: 25 mmol/L (ref 19–32)
CREATININE: 1.16 mg/dL (ref 0.50–1.35)
Chloride: 105 mmol/L (ref 96–112)
GFR calc Af Amer: 81 mL/min — ABNORMAL LOW (ref >90)
GFR calc non Af Amer: 70 mL/min — ABNORMAL LOW (ref >90)
GLUCOSE: 116 mg/dL — AB (ref 70–99)
Potassium: 3.8 mmol/L (ref 3.5–5.1)
Sodium: 137 mmol/L (ref 135–145)

## 2015-02-08 NOTE — Discharge Summary (Signed)
Physician Discharge Summary  Patient ID: Roger Crosby MRN: [id number] DOB/AGE: [birth year] 54 y.o.  Admit date: 02/07/2015 Discharge date: 02/10/2015   Admission Diagnoses:  Patient Active Problem List   Diagnosis Date Noted  . NICM (nonischemic cardiomyopathy) 12/15/2014  . Chronic systolic heart failure 53/29/9242  . Chronic systolic CHF (congestive heart failure) 08/23/2013  . DENTAL PAIN 04/04/2010  . DYSPNEA 04/04/2010  . LBBB (left bundle branch block) 02/14/2010  . SKIN TAG 02/14/2010  . LEG PAIN 02/14/2010  . HYPERLIPIDEMIA 01/07/2010  . ERECTILE DYSFUNCTION 04/09/2009  . 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 Benign essential HTN 01/28/2009  . GERD 01/28/2009  . LOW BACK PAIN 01/28/2009    Discharge Diagnoses:   Patient Active Problem List   Diagnosis Date Noted  . S/P ICD (internal cardiac defibrillator) procedure 02/07/2015  . NICM (nonischemic cardiomyopathy) 12/15/2014  . Chronic systolic heart failure 68/34/1962  . Chronic systolic CHF (congestive heart failure) 08/23/2013  . DENTAL PAIN 04/04/2010  . DYSPNEA 04/04/2010  . LBBB (left bundle branch block) 02/14/2010  . SKIN TAG 02/14/2010  . LEG PAIN 02/14/2010  . HYPERLIPIDEMIA 01/07/2010  . ERECTILE DYSFUNCTION 04/09/2009  . 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 Benign essential HTN 01/28/2009  . GERD 01/28/2009  . LOW BACK PAIN 01/28/2009   Postop urinary retention   Discharged Condition: good   History of Present Illness:  [name] is a 54 yo male with history of HTN, CKD Stage II, Hypercholesterolemia, Non- Obstructive CAD, and Non Ischemic Cardiomyopathy with a reduced EF of 25-30%.  He also has a history of alcohol and crack cocaine use.  He underwent placement of dual chamber ICD in January by Dr. Caryl Comes.  During that procedure they were unable to place the LV Lead.  Therefore, he was referred to [facility] for epicardial lead placement.  He was evaluated  by Dr. Cyndia Bent on 01/13/2015.  He discussed placing a LV Lead via Thoracotomy procedure and the patient was agreeable to proceed.  The risks and benefits of the procedure were explained to the patient and he was agreeable to proceed.     Hospital Course:   [name] presented to [facility] on 02/07/2015.  He was taken to the operating room and underwent Left Thoracotomy with Insertion of LV Epicardial pacing leads x 2.  He tolerated the procedure well, was extubated and taken to the PACU in stable condition.  Post operative the patient had difficulty with urinary retention.  Foley catheter was placed.  His chest tube did not show evidence of air leak and was later removed once drainage was minimal.  He was maintaining NSR and his device was interrogated and felt to be working properly.  He was felt medically stable for transfer to the telemetry unit on POD #2.  He continues to do well.  He did have urinary retention after his Foley catheter was removed and required reinsertion.  A voiding trial was ordered after 24 hours with the catheter in place, and the patient has been voiding without difficulty since that time.  He is ambulating independently.  He is tolerating a cardiac diet. He has been evaluated on today's date and is medically stable for discharge home.   Treatments: surgery:   1. Left anterolateral thoracotomy 2. Insertion of 2 left ventricular epicardial pacing leads 3. Revision of Biventricular pacing system   Disposition: 01-Home or Self Care   Discharge Medications:    Medication List    STOP taking  these medications        HYDROcodone-acetaminophen 5-325 MG per tablet  Commonly known as:  NORCO      TAKE these medications        aspirin EC 81 MG tablet  Take 1 tablet (81 mg total) by mouth daily.     carvedilol 12.5 MG tablet  Commonly known as:  COREG  Take 1.5 tablets (18.75 mg total) by mouth 2 (two) times daily with a meal.     furosemide 20 MG tablet   Commonly known as:  LASIX  Take 1 tablet (20 mg total) by mouth daily.     omeprazole 20 MG capsule  Commonly known as:  PRILOSEC  TAKE ONE CAPSULE BY MOUTH EVERY DAY     oxyCODONE 5 MG immediate release tablet  Commonly known as:  Oxy IR/ROXICODONE  Take 1-2 tablets (5-10 mg total) by mouth every 4 (four) hours as needed for severe pain.     pseudoephedrine 30 MG tablet  Commonly known as:  SUDAFED  Take 1 tablet (30 mg total) by mouth every 6 (six) hours as needed for congestion.     sacubitril-valsartan 49-51 MG  Commonly known as:  ENTRESTO  Take 1 tablet by mouth 2 (two) times daily.     spironolactone 25 MG tablet  Commonly known as:  ALDACTONE  Take 1 tablet (25 mg total) by mouth daily.         Follow Up:  Follow-up Information    Follow up with Patsey Berthold, Student-NP On 02/21/2015.   Specialty:  Nurse Practitioner   Why:  at 2:30PM   Contact information:   [place] [address] [phone]       Follow up with Gaye Pollack, MD On 02/20/2015.   Specialty:  Cardiothoracic Surgery   Why:  Appointment is at 2:00   Contact information:   [address] [place] [address] [phone]       Follow up with [facility] On 02/20/2015.   Why:  Please get CXR at 1:00   Contact information:   [facility]       Signed: Burke Keels 02/10/2015, 8:56 AM

## 2015-02-08 NOTE — Progress Notes (Signed)
Fentanyl PCA syringe wasted with Vicente Masson RN witness.

## 2015-02-08 NOTE — Discharge Instructions (Signed)
Thoracotomy, Care After °Refer to this sheet in the next few weeks. These instructions provide you with information on caring for yourself after your procedure. Your health care provider may also give you more specific instructions. Your treatment has been planned according to current medical practices, but problems sometimes occur. Call your health care provider if you have any problems or questions after your procedure. °WHAT TO EXPECT AFTER YOUR PROCEDURE °After your procedure, it is typical to have the following sensations: °· You may feel pain at the incision site. °· You may be constipated from the pain medicine given and the change in your level of activity. °· You may feel extremely tired. °HOME CARE INSTRUCTIONS °· Take over-the-counter or prescription medicines for pain, discomfort, or fever only as directed by your health care provider. It is very important to take pain relieving medicine before your pain becomes severe. You will be able to breathe and cough more comfortably if your pain is well controlled. °· Take deep breaths. Deep breathing helps to keep your lungs inflated and protects against a lung infection (pneumonia). °· Cough frequently. Even though coughing may cause discomfort, coughing is important to clear mucus (phlegm) and expand your lungs. Coughing helps prevent pneumonia. If it hurts to cough, hold a pillow against your chest when you cough. This may help with the discomfort. °· Continue to use an incentive spirometer as directed. The use of an incentive spirometer helps to keep your lungs inflated and protects against pneumonia. °· Change the bandages over your incision as needed or as directed by your health care provider. °· Remove the bandages over your chest tube site as directed by your health care provider. °· Resume your normal diet as directed. It is important to have adequate protein, calories, vitamins, and minerals to promote healing. °· Prevent constipation. °¨ Eat  high-fiber foods such as whole grain cereals and breads, brown rice, beans, and fresh fruits and vegetables. °¨ Drink enough water and fluids to keep your urine clear or pale yellow. Avoid drinking beverages containing caffeine. Beverages containing caffeine can cause dehydration and harden your stool. °¨ Talk to your health care provider about taking a stool softener or laxative. °· Avoid lifting until you are instructed otherwise. °· Do not drive until directed by your health care provider.  Do not drive while taking pain medicines (narcotics). °· Do not bathe, swim, or use a hot tub until directed by your health care provider. You may shower instead. Gently wash the area of your incision with water and soap as directed. Do not use anything else to clean your incision except as directed by your health care provider. °· Do not use any tobacco products including cigarettes, chewing tobacco, or electronic cigarettes. °· Avoid secondhand smoke. °· Schedule an appointment for stitch (suture) or staple removal as directed. °· Schedule and attend all follow-up visits as directed by your health care provider. It is important to keep all your appointments. °· Participate in pulmonary rehabilitation as directed by your health care provider. °· Do not travel by airplane for 2 weeks after your chest tube is removed. °SEEK MEDICAL CARE IF: °· You are bleeding from your wounds. °· Your heartbeat seems irregular. °· You have redness, swelling, or increasing pain in the wounds. °· There is pus coming from your wounds. °· There is a bad smell coming from the wound or dressing. °· You have a fever or chills. °· You have nausea or are vomiting. °· You have muscle aches. °SEEK   IMMEDIATE MEDICAL CARE IF: °· You have a rash. °· You have difficulty breathing. °· You have a reaction or side effect to medicines given. °· You have persistent nausea. °· You have lightheadedness or feel faint. °· You have shortness of breath or chest  pain. °· You have persistent pain. °Document Released: 05/01/2011 Document Revised: 11/21/2013 Document Reviewed: 07/05/2013 °Exit[employer]® Patient Information ©2015 [employer]. This information is not intended to replace advice given to you by your health care provider. Make sure you discuss any questions you have with your health care provider. ° °

## 2015-02-08 NOTE — Progress Notes (Signed)
1 Day Post-Op Procedure(s) (LRB): THORACOTOMY MAJOR (Left) INSERTION OF LV EPICARDIAL PACING LEAD (N/A) Subjective: No complaints  Objective: Vital signs in last 24 hours: Temp:  [97.4 F (36.3 C)-98.2 F (36.8 C)] 98.1 F (36.7 C) (03/11 0835) Pulse Rate:  [45-106] 81 (03/11 0900) Cardiac Rhythm:  [-] A-V Sequential paced (03/11 0730) Resp:  [13-29] 16 (03/11 0900) BP: (97-189)/(58-98) 97/68 mmHg (03/11 0900) SpO2:  [94 %-100 %] 97 % (03/11 0900) Arterial Line BP: (127-222)/(68-133) 152/79 mmHg (03/11 0900)  Hemodynamic parameters for last 24 hours:    Intake/Output from previous day: 03/10 0701 - 03/11 0700 In: 2427.5 [I.V.:2427.5] Out: 1496 [Urine:1280; Chest Tube:216] Intake/Output this shift: Total I/O In: 460 [P.O.:360; I.V.:50; IV Piggyback:50] Out: 480 [Urine:435; Chest Tube:45]  General appearance: alert and cooperative Neurologic: intact Heart: regular rate and rhythm, S1, S2 normal, no murmur, click, rub or gallop Lungs: clear to auscultation bilaterally Wound: incisions ok chest tube output minimal  Lab Results:  Recent Labs  02/05/15 1400 02/08/15 0305  WBC 8.3 12.6*  HGB 14.1 12.9*  HCT 43.5 39.9  PLT 278 262   BMET:  Recent Labs  02/05/15 1400 02/08/15 0305  NA 140 137  K 4.1 3.8  CL 108 105  CO2 26 25  GLUCOSE 82 116*  BUN 16 8  CREATININE 1.36* 1.16  CALCIUM 9.1 7.9*    PT/INR:  Recent Labs  02/05/15 1400  LABPROT 12.9  INR 0.96   ABG    Component Value Date/Time   PHART 7.388 02/08/2015 0300   HCO3 25.6* 02/08/2015 0300   TCO2 26.9 02/08/2015 0300   ACIDBASEDEF 1.0 02/05/2015 1400   O2SAT 95.4 02/08/2015 0300   CBG (last 3)  No results for input(s): GLUCAP in the last 72 hours.  Assessment/Plan: S/P Procedure(s) (LRB): THORACOTOMY MAJOR (Left) INSERTION OF LV EPICARDIAL PACING LEAD (N/A) Mobilize d/c tubes/lines Continue foley due to acute urinary retention and bladder outlet obstruction. Foley had to be  inserted last pm. Will keep it in today and plan to remove tomorrow am. Transfer to 2W circle   LOS: 1 day    Gaye Pollack 02/08/2015

## 2015-02-09 MED ORDER — ALUM & MAG HYDROXIDE-SIMETH 200-200-20 MG/5ML PO SUSP
30.0000 mL | ORAL | Status: DC | PRN
  Administered 2015-02-10: 30 mL via ORAL
  Filled 2015-02-09: qty 30

## 2015-02-09 NOTE — Progress Notes (Addendum)
       [address]       [place] [address]             [phone]          2 Days Post-Op Procedure(s) (LRB): THORACOTOMY MAJOR (Left) INSERTION OF LV EPICARDIAL PACING LEAD (N/A)  Subjective: Feels well overall, just sore at incision site. Passing flatus, but no BM yet.    Objective: Vital signs in last 24 hours: Patient Vitals for the past 24 hrs:  BP Temp Temp src Pulse Resp SpO2  02/09/15 0500 120/79 mmHg 98.2 F (36.8 C) Oral 95 17 92 %  02/08/15 2013 131/62 mmHg 98.5 F (36.9 C) Oral 93 17 97 %  02/08/15 1731 (!) 149/89 mmHg - - - - -  02/08/15 1715 (!) 160/92 mmHg 98.5 F (36.9 C) Oral 88 20 -  02/08/15 1600 133/74 mmHg - - 86 19 91 %  02/08/15 1400 133/72 mmHg - - 82 (!) 25 94 %  02/08/15 1300 (!) 151/75 mmHg - - 79 20 95 %  02/08/15 1228 - 98.5 F (36.9 C) Oral - - -  02/08/15 1200 (!) 144/61 mmHg - - 87 (!) 21 93 %  02/08/15 1130 - - - 91 15 96 %  02/08/15 1100 128/77 mmHg - - 87 (!) 23 93 %  02/08/15 1000 117/64 mmHg - - 81 20 96 %  02/08/15 0900 97/68 mmHg - - 81 16 97 %  02/08/15 0835 - 98.1 F (36.7 C) Oral - - -   Current Weight  02/07/15 230 lb (104.327 kg)     Intake/Output from previous day: 03/11 0701 - 03/12 0700 In: 635 [P.O.:360; I.V.:225; IV Piggyback:50] Out: 2935 [Urine:2890; Chest Tube:45]    PHYSICAL EXAM:  Heart: RRR, paced Lungs: Clear Wound: Clean and dry    Lab Results: CBC: Recent Labs  02/08/15 0305  WBC 12.6*  HGB 12.9*  HCT 39.9  PLT 262   BMET:  Recent Labs  02/08/15 0305  NA 137  K 3.8  CL 105  CO2 25  GLUCOSE 116*  BUN 8  CREATININE 1.16  CALCIUM 7.9*    PT/INR: No results for input(s): LABPROT, INR in the last 72 hours.    Assessment/Plan: S/P Procedure(s) (LRB): THORACOTOMY MAJOR (Left) INSERTION OF LV EPICARDIAL PACING LEAD (N/A)  CV- Pacer functioning appropriately.  Bps stable. GU- postop urinary retention requiring Foley reinsertion.  Will d/c Foley and  attempt voiding trial today. GI- LOC today. Hopefully home soon if he can void and otherwise remains stable.   LOS: 2 days    COLLINS,GINA H 02/09/2015  Foley out today, if voiding ok tomorrow plan d/c I have seen and examined Enzo Bi and agree with the above assessment  and plan.  Grace Isaac MD [facility] [phone] Office [phone] 02/09/2015 10:12 AM

## 2015-02-09 NOTE — Progress Notes (Signed)
Foley d/c per MD order; pt given pain meds per pt request for L surgical site pain; pt encouraged to ambulate; pt states he will ambulate about 1030 this morn; pt also encouraged to get OOB to chair; pt stated he would do this after he ambulated; will cont. To monitor.

## 2015-02-09 NOTE — Progress Notes (Signed)
Pt sitting up in chair and c/o nausea; pt given IV Zofran at this time; pt assisted back to bed; call bell wi reach; will cont. To monitor.

## 2015-02-09 NOTE — Progress Notes (Signed)
Pt reports nausea has improved; will cont. To monitor.

## 2015-02-09 NOTE — Progress Notes (Signed)
Pt ambulated 350 feet; pt back to room to chair; will cont. To monitor.

## 2015-02-10 ENCOUNTER — Inpatient Hospital Stay (HOSPITAL_COMMUNITY): Payer: Medicaid Other

## 2015-02-10 ENCOUNTER — Encounter (HOSPITAL_COMMUNITY): Payer: Self-pay | Admitting: Surgery

## 2015-02-10 MED ORDER — OXYCODONE HCL 5 MG PO TABS: 5.0000 mg | ORAL_TABLET | ORAL | Status: DC | PRN

## 2015-02-10 MED ORDER — ONDANSETRON HCL 4 MG PO TABS: 4.0000 mg | ORAL_TABLET | Freq: Three times a day (TID) | ORAL | Status: DC | PRN

## 2015-02-10 NOTE — Progress Notes (Signed)
Patient's pacemaker failing to capture. Wide QRS. Patient asymptomatic. VSS. Rate 80's-90's. EP MD notified. EP MD to see patient in the am. Cardiothoracic MD made aware as well. No orders given at this time. Will continue to monitor.  Domingo Dimes RN

## 2015-02-10 NOTE — Care Management Note (Signed)
    Page 1 of 1   02/10/2015     1:48:28 PM CARE MANAGEMENT NOTE 02/10/2015  Patient:  Roger Crosby, Roger Crosby   Account Number:  [id number]  Date Initiated:  02/10/2015  Documentation initiated by:  [facility]  Subjective/Objective Assessment:   adm: placing a LV Lead     Action/Plan:   discharge planning   Anticipated DC Date:  02/10/2015   Anticipated DC Plan:  [place]  CM consult      Choice offered to / List presented to:             Status of service:  Completed, signed off Medicare Important Message given?   (If response is "NO", the following Medicare IM given date fields will be blank) Date Medicare IM given:   Medicare IM given by:   Date Additional Medicare IM given:   Additional Medicare IM given by:    Discharge Disposition:  HOME/SELF CARE  Per UR Regulation:    If discussed at Long Length of Stay Meetings, dates discussed:    Comments:  02/10/15 13:40 CM notes pt has HH order for aide.  Cm spoke with RN and explained [facility] does not stand alone and pt does not need any other disciplines.  Pt is Medicaid and condition does not warrant HHPT/OT coverage.  No other CM needs were communicated.  Mariane Masters, BSN, Cm [phone].

## 2015-02-10 NOTE — Progress Notes (Signed)
Pt to see EP MD prior to d/c home today; EP MD paged at this time; will await callback.

## 2015-02-10 NOTE — Progress Notes (Addendum)
       [address]       [place] [address]             [phone]          3 Days Post-Op Procedure(s) (LRB): THORACOTOMY MAJOR (Left) INSERTION OF LV EPICARDIAL PACING LEAD (N/A)  Subjective: Feels well, voiding without difficulty since Foley removed.  Had an episode last night where pacer was misfiring, failing to capture.  EP notified and will see pt this am.   Objective: Vital signs in last 24 hours: Patient Vitals for the past 24 hrs:  BP Temp Temp src Pulse Resp SpO2  02/10/15 0424 122/63 mmHg 98.7 F (37.1 C) Oral 80 13 93 %  02/09/15 2133 126/77 mmHg 99 F (37.2 C) Oral 88 16 97 %  02/09/15 1429 135/73 mmHg 98.5 F (36.9 C) Oral 79 18 91 %  02/09/15 1231 130/86 mmHg - - 70 - -   Current Weight  02/07/15 230 lb (104.327 kg)     Intake/Output from previous day: 03/12 0701 - 03/13 0700 In: 480 [P.O.:480] Out: 850 [Urine:850]    PHYSICAL EXAM:  Heart: RRR, paced Lungs: Clear Wound: Clean and dry     Lab Results: CBC: Recent Labs  02/08/15 0305  WBC 12.6*  HGB 12.9*  HCT 39.9  PLT 262   BMET:  Recent Labs  02/08/15 0305  NA 137  K 3.8  CL 105  CO2 25  GLUCOSE 116*  BUN 8  CREATININE 1.16  CALCIUM 7.9*    PT/INR: No results for input(s): LABPROT, INR in the last 72 hours.  CXR: FINDINGS: LEFT subclavian pacemaker leads project at RIGHT atrium and RIGHT ventricle.  Epicardial pacing leads also identified.  Interval removal of RIGHT jugular line and LEFT thoracostomy tube.  Enlargement of cardiac silhouette.  Mediastinal contours and pulmonary vascularity normal.  RIGHT basilar atelectasis.  Upper lungs clear.  No pleural effusion or pneumothorax.  Subcutaneous emphysema at LEFT lateral chest wall and at LEFT supraclavicular region likely related to prior chest tube.  IMPRESSION: Increased RIGHT basilar atelectasis.  Enlargement of cardiac silhouette.   Assessment/Plan: S/P Procedure(s)  (LRB): THORACOTOMY MAJOR (Left) INSERTION OF LV EPICARDIAL PACING LEAD (N/A) CV- overnight had an episode where pacer did not capture, firing inappropriately.  Currently appears to be pacing normally on telemetry. EP to see. Urinary retention- voiding without difficulty after Foley removed. Stable for discharge home today after seen by EP/cardiology.   LOS: 3 days    COLLINS,GINA H 02/10/2015  Home after clearance from ep about functioning of device I have seen and examined Enzo Bi and agree with the above assessment  and plan.  Grace Isaac MD Beeper [phone] Office [phone] 02/10/2015 12:31 PM

## 2015-02-10 NOTE — Progress Notes (Signed)
IV and tele monitor d/c at this time; pt given d/c instructions and prescriptions; pt to d/c home with wife; friend at bedside to transport pt home; will cont. To monitor.

## 2015-02-10 NOTE — Progress Notes (Signed)
There was a question of failure to sense on telemetry. I had pacer rep interrogate device and it is functioning normally. OK for discharge, keep f/u as scheduled.  Kerin Ransom PA-C 02/10/2015 1:23 PM

## 2015-02-10 NOTE — Progress Notes (Signed)
Per PA, leave chest tube suture in place; suture to be removed at follow up appointment with surgeon.

## 2015-02-10 NOTE — Progress Notes (Signed)
Pt not wanting to ambulate at this time; pt states he just feels "tired" this morning; will cont. To monitor.

## 2015-02-14 ENCOUNTER — Other Ambulatory Visit: Payer: Self-pay

## 2015-02-14 DIAGNOSIS — G8918 Other acute postprocedural pain: Secondary | ICD-10-CM

## 2015-02-14 MED ORDER — OXYCODONE HCL 5 MG PO TABS: 5.0000 mg | ORAL_TABLET | Freq: Four times a day (QID) | ORAL | Status: DC | PRN

## 2015-02-14 NOTE — Telephone Encounter (Signed)
RX refill for Oxycodone 5 mg 1 tab every 6 hours prn pain No refills printed out. Patient will pickup at front desk

## 2015-02-18 ENCOUNTER — Other Ambulatory Visit: Payer: Self-pay | Admitting: Surgery

## 2015-02-18 DIAGNOSIS — Z95810 Presence of automatic (implantable) cardiac defibrillator: Secondary | ICD-10-CM

## 2015-02-20 ENCOUNTER — Ambulatory Visit (INDEPENDENT_AMBULATORY_CARE_PROVIDER_SITE_OTHER): Payer: Medicaid Other | Admitting: Surgery

## 2015-02-20 ENCOUNTER — Encounter: Payer: Self-pay | Admitting: Surgery

## 2015-02-20 ENCOUNTER — Ambulatory Visit
Admission: RE | Admit: 2015-02-20 | Discharge: 2015-02-20 | Disposition: A | Discharge status: Home / Self Care | Payer: Medicaid Other | Source: Ambulatory Visit | Attending: Surgery | Admitting: Surgery

## 2015-02-20 VITALS — BP 129/79 | HR 80 | Resp 20 | Ht 68.0 in | Wt 230.0 lb

## 2015-02-20 DIAGNOSIS — Z95810 Presence of automatic (implantable) cardiac defibrillator: Secondary | ICD-10-CM

## 2015-02-20 DIAGNOSIS — I429 Cardiomyopathy, unspecified: Secondary | ICD-10-CM

## 2015-02-20 NOTE — Progress Notes (Signed)
      HPI: Patient returns for routine postoperative follow-up having undergone left thoracotomy for placement of LV epicardial pacing leads and revision to a BiV system on 02/07/2015. The patient's early postoperative recovery while in the hospital was notable for an uncomplicated postop course. Since hospital discharge the patient reports that he has been feeling fairly well. He has mild left chest wall soreness. The Oxycodone was causing nausea so he is only taking tylenol now. He has been walking short distances without shortness of breath. He had some dizziness this am but that was the first time.   Current Outpatient Prescriptions  Medication Sig Dispense Refill  . aspirin EC 81 MG tablet Take 1 tablet (81 mg total) by mouth daily.    . carvedilol (COREG) 12.5 MG tablet Take 1.5 tablets (18.75 mg total) by mouth 2 (two) times daily with a meal. 90 tablet 3  . furosemide (LASIX) 20 MG tablet Take 1 tablet (20 mg total) by mouth daily. 30 tablet 6  . omeprazole (PRILOSEC) 20 MG capsule TAKE ONE CAPSULE BY MOUTH EVERY DAY (Patient taking differently: Take 20mg  by mouth once daily) 30 capsule 0  . ondansetron (ZOFRAN) 4 MG tablet Take 1 tablet (4 mg total) by mouth every 8 (eight) hours as needed for nausea or vomiting. 20 tablet 0  . Sacubitril-Valsartan (ENTRESTO) 49-51 MG TABS Take 1 tablet by mouth 2 (two) times daily. 60 tablet 3  . spironolactone (ALDACTONE) 25 MG tablet Take 1 tablet (25 mg total) by mouth daily. 30 tablet 3   No current facility-administered medications for this visit.    Physical Exam: BP 129/79 mmHg  Pulse 80  Resp 20  Ht 5\' 8"  (1.727 m)  Wt 230 lb (104.327 kg)  BMI 34.98 kg/m2  SpO2 97% He looks well Cardiac exam shows a regular rate and rhythm with normal heart sounds Lung exam shows slight decrease in breath sounds at the left base The left chest incision is healing well. The chest tube suture was removed. The pacer pocket incision is healing  well. There is no peripheral edema.  Diagnostic Tests:  CLINICAL DATA: Pain and shortness of breath, history of defibrillator placement in January of 2016 with thoraco are commonly and epicardial pacing leads placed on February 07, 2015.  EXAM: CHEST 2 VIEW  COMPARISON: PA and lateral chest of February 10, 2015  FINDINGS: The right lung base is clear. There is persistent increased density at the left lung base posteriorly. There is a small left pleural effusion. The cardiac silhouette is top-normal in size. The permanent pacemaker defibrillator leads are in reasonable position radiographically. Left ventricular epicardial pacing leads are present.  IMPRESSION: Interval clearing of right lower lobe atelectasis. There is persistent left lower lobe atelectasis and small left pleural effusion.   Electronically Signed  By: David Martinique  On: 02/20/2015 13:36   Impression:  He is doing well following his surgery. I encouraged him to continue walking and to use his incentive spirometer.  Plan:  He has an appt with Dr. Caryl Comes tomorrow. I will see him in 6 weeks with a CXR to be sure that his pleural effusion resolves.   Gaye Pollack, MD [facility] [phone]

## 2015-02-21 ENCOUNTER — Encounter: Payer: Self-pay | Admitting: Nurse Practitioner

## 2015-02-21 ENCOUNTER — Ambulatory Visit (INDEPENDENT_AMBULATORY_CARE_PROVIDER_SITE_OTHER): Payer: Medicaid Other | Admitting: Nurse Practitioner

## 2015-02-21 VITALS — BP 110/70 | HR 82 | Ht 68.0 in | Wt 229.8 lb

## 2015-02-21 DIAGNOSIS — I5022 Chronic systolic (congestive) heart failure: Secondary | ICD-10-CM | POA: Diagnosis not present

## 2015-02-21 DIAGNOSIS — I447 Left bundle-branch block, unspecified: Secondary | ICD-10-CM

## 2015-02-21 DIAGNOSIS — I428 Other cardiomyopathies: Secondary | ICD-10-CM

## 2015-02-21 DIAGNOSIS — I429 Cardiomyopathy, unspecified: Secondary | ICD-10-CM

## 2015-02-21 NOTE — Patient Instructions (Signed)
Your physician recommends that you continue on your current medications as directed. Please refer to the Current Medication list given to you today.  Please reschedule appointment with Dr. Caryl Comes for after appointment with Dr. Cyndia Bent on 5/4

## 2015-02-22 ENCOUNTER — Encounter: Payer: Self-pay | Admitting: Nurse Practitioner

## 2015-02-22 ENCOUNTER — Other Ambulatory Visit: Payer: Self-pay | Admitting: Internal Medicine

## 2015-02-22 LAB — MDC_IDC_ENUM_SESS_TYPE_INCLINIC
Implantable Pulse Generator Serial Number: 7209167
Lead Channel Setting Pacing Amplitude: 3.5 V
Lead Channel Setting Pacing Pulse Width: 0.5 ms
Lead Channel Setting Sensing Sensitivity: 0.5 mV
MDC IDC SET LEADCHNL RA PACING AMPLITUDE: 3.5 V
MDC IDC SET ZONE DETECTION INTERVAL: 300 ms
Zone Setting Detection Interval: 250 ms

## 2015-02-22 NOTE — Progress Notes (Signed)
Electrophysiology Office Note Date: 02/22/2015  ID:  Roger Crosby, DOB [birth year], MRN [id number]  PCP: No PCP Per Patient Primary Cardiologist: [place] Electrophysiologist: Caryl Comes   CC: Post hospital ICD follow-up  Roger Crosby is a 54 y.o. male is seen today for Dr Caryl Comes.  He presents today for post hospital electrophysiology followup. He recently underwent epicardial LV lead placement by Dr Cyndia Bent.  Since discharge, the patient reports doing very well.  He has had significant improvement in shortness of breath.  He is still having mild incisional pain.  He was seen by Dr Cyndia Bent yesterday and CXR was obtained which demonstrated persistent small pericardial effusion.   He denies chest pain, palpitations, dyspnea, PND, orthopnea, nausea, vomiting, dizziness, syncope, edema, weight gain, or early satiety.  He has not had ICD shocks.   Device History: STJ CRTD implanted 11/2014 for non ischemic cardiomyopathy and chronic systolic heart failure with failed LV placement at that time. Underwent epicardial LV lead placement 01/2015 with Dr Cyndia Bent with significant improvement in CHF symptoms History of appropriate therapy: No History of AAD therapy: No   Past Medical History  Diagnosis Date  . HTN (hypertension)   . GERD (gastroesophageal reflux disease)   . LBP (low back pain)   . LBBB (left bundle branch block)   . Hx of echocardiogram     echo 5/11: EF 45-50%, mild HK of Ap AS wall, ap Ant wall and true apex (LAD distribution), mild LAE  . NICM (nonischemic cardiomyopathy)   . CAD (coronary artery disease)     nonobstructive by [facility] 6/11: oOM1 40%, mild luminal irregs elsewhere  . Systolic heart failure   . AICD (automatic cardioverter/defibrillator) present     a.  Unsuccessful LV lead placement 11/2014  . High cholesterol   . CKD (chronic kidney disease)    Past Surgical History  Procedure Laterality Date  . Left heart catheterization with coronary angiogram N/A 08/04/2013   Procedure: LEFT HEART CATHETERIZATION WITH CORONARY ANGIOGRAM;  Surgeon: Larey Dresser, MD;  Location: [facility] CATH LAB;  Service: Cardiovascular;  Laterality: N/A;  . Cardiac catheterization Left 07/2013    with angiogram  . Bi-ventricular implantable cardioverter defibrillator N/A 12/14/2014    with failed LV lead placeemnt  . Thoracotomy Left 02/07/2015    Procedure: THORACOTOMY MAJOR;  Surgeon: Gaye Pollack, MD;  Location: [facility] OR;  Service: Thoracic;  Laterality: Left;  . Epicardial pacing lead placement N/A 02/07/2015    eicardial LV lead placed by Dr Laverta Baltimore    Current Outpatient Prescriptions  Medication Sig Dispense Refill  . aspirin EC 81 MG tablet Take 1 tablet (81 mg total) by mouth daily.    . carvedilol (COREG) 12.5 MG tablet Take 1.5 tablets (18.75 mg total) by mouth 2 (two) times daily with a meal. 90 tablet 3  . furosemide (LASIX) 20 MG tablet Take 1 tablet (20 mg total) by mouth daily. 30 tablet 6  . omeprazole (PRILOSEC) 20 MG capsule TAKE ONE CAPSULE BY MOUTH EVERY DAY (Patient taking differently: Take 20mg  by mouth once daily) 30 capsule 0  . ondansetron (ZOFRAN) 4 MG tablet Take 1 tablet (4 mg total) by mouth every 8 (eight) hours as needed for nausea or vomiting. 20 tablet 0  . Sacubitril-Valsartan (ENTRESTO) 49-51 MG TABS Take 1 tablet by mouth 2 (two) times daily. 60 tablet 3  . spironolactone (ALDACTONE) 25 MG tablet Take 1 tablet (25 mg total) by mouth daily. 30 tablet 3   No current facility-administered  medications for this visit.    Allergies:   Review of patient's allergies indicates no known allergies.   Social History: History   Social History  . Marital Status: Married    Spouse Name: N/A  . Number of Children: N/A  . Years of Education: N/A   Occupational History  . Not on file.   Social History Main Topics  . Smoking status: Former Smoker -- 0.12 packs/day for 25 years    Types: Cigarettes    Quit date: 07/31/2014  . Smokeless tobacco: Never Used    . Alcohol Use: No  . Drug Use: Yes    Special: "Crack" cocaine     Comment: 12/14/2014 "last crack was in 2014"  . Sexual Activity: Yes   Other Topics Concern  . Not on file   Social History Narrative    Family History: Family History  Problem Relation Age of Onset  . Heart attack Mother     in 54's  . Heart disease Mother     ischemic  . Heart attack Father     in 62's  . Heart disease Father     ischemic  . Congestive Heart Failure Mother   . Diabetes Mother   . Hypertension Mother   . Heart Problems Father   . Crohn's disease Sister   . Prostate cancer Brother   . HIV/AIDS Brother     Review of Systems: General: No chills, fever, night sweats or weight changes  Cardiovascular:  No chest pain, dyspnea on exertion, edema, orthopnea, palpitations, paroxysmal nocturnal dyspnea; denies ICD shocks Dermatological: No rash, lesions or masses Respiratory: No cough, dyspnea Urologic: No hematuria, dysuria Abdominal: No nausea, vomiting, diarrhea, bright red blood per rectum, melena, or hematemesis Neurologic: No visual changes, weakness, changes in mental status All other systems reviewed and are otherwise negative except as noted above.   Physical Exam: VS:  BP 110/70 mmHg  Pulse 82  Ht 5\' 8"  (1.727 m)  Wt 229 lb 12.8 oz (104.237 kg)  BMI 34.95 kg/m2 , BMI Body mass index is 34.95 kg/(m^2).  GEN- The patient is well appearing, alert and oriented x 3 today.   HEENT: normocephalic, atraumatic; sclera clear, conjunctiva pink; hearing intact; oropharynx clear; neck supple, no JVP Lymph- no cervical lymphadenopathy Lungs- Clear to ausculation bilaterally, normal work of breathing.  No wheezes, rales, rhonchi Heart- Regular rate and rhythm, no murmurs, rubs or gallops, PMI not laterally displaced GI- soft, non-tender, non-distended, bowel sounds present, no hepatosplenomegaly Extremities- no clubbing, cyanosis, or edema; DP/PT/radial pulses 2+ bilaterally MS- no  significant deformity or atrophy Skin- warm and dry, no rash or lesion; ICD pocket well healed Psych- euthymic mood, full affect Neuro- strength and sensation are intact  ICD interrogation- reviewed in detail today,  See PACEART report  EKG:  EKG is not ordered today.   Recent Labs: 10/24/2014: Pro B Natriuretic peptide (BNP) 285.6* 02/05/2015: ALT 19 02/08/2015: BUN 8; Creatinine 1.16; Hemoglobin 12.9*; Platelets 262; Potassium 3.8; Sodium 137   Wt Readings from Last 3 Encounters:  02/21/15 229 lb 12.8 oz (104.237 kg)  02/20/15 230 lb (104.327 kg)  02/07/15 230 lb (104.327 kg)     Other studies Reviewed: Additional studies/ records that were reviewed today include: hospital records, Dr Vivi Martens office notes    Assessment and Plan:  1.  Chronic systolic dysfunction euvolemic today Significantly improved symptomatically post epicardial LV lead placement.  EKG post procedure demonstrated narrowing of QRS from >125msec to 146msec. Device interrogation today  demonstrates >99% Bi-V pacing with LV threshold of 1.25V@0 .2msec (stable from implant) Continue BB, Entresto, diuretics per AHF clinic See Pace Art report No changes today  2.  Orthostatic dizziness The patient states that following surgery he had significant orthostatic intolerance which has improved over time.  He has an appt with Dr Haroldine Laws on Monday. Not orthostatic in the office today.  Will leave medications unchanged and encouraged the patient to track symptoms and discuss with Dr Haroldine Laws Monday.   3  Pericardial effusion Pt has follow up with Dr Cyndia Bent in 6 weeks to re-evaluate  Encouraged use of IS  Current medicines are reviewed at length with the patient today.   The patient does not have concerns regarding his medicines.  The following changes were made today:  none  Labs/ tests ordered today include: none  Disposition:   Follow up with Dr Caryl Comes 8 weeks. Follow up with AHF clinic and Dr Cyndia Bent as  scheduled.    Signed, Chanetta Marshall, NP 02/22/2015 5:07 PM  [address] [place] [address] [phone] (office) [phone] (fax

## 2015-02-25 ENCOUNTER — Encounter (HOSPITAL_COMMUNITY): Payer: Medicaid Other

## 2015-02-25 ENCOUNTER — Other Ambulatory Visit (HOSPITAL_COMMUNITY): Payer: Self-pay

## 2015-02-25 MED ORDER — OMEPRAZOLE 20 MG PO CPDR: 20.0000 mg | DELAYED_RELEASE_CAPSULE | Freq: Every day | ORAL | Status: DC

## 2015-03-06 ENCOUNTER — Encounter (HOSPITAL_COMMUNITY): Payer: Medicaid Other

## 2015-03-11 ENCOUNTER — Encounter: Payer: Self-pay | Admitting: Internal Medicine

## 2015-03-15 ENCOUNTER — Other Ambulatory Visit (HOSPITAL_COMMUNITY)
Admission: RE | Admit: 2015-03-15 | Discharge: 2015-03-15 | Disposition: A | Discharge status: Home / Self Care | Payer: Medicaid Other | Source: Ambulatory Visit | Attending: Internal Medicine | Admitting: Internal Medicine

## 2015-03-15 ENCOUNTER — Encounter: Payer: Self-pay | Admitting: Internal Medicine

## 2015-03-15 ENCOUNTER — Ambulatory Visit: Payer: Medicaid Other | Attending: Internal Medicine | Admitting: Internal Medicine

## 2015-03-15 VITALS — BP 144/91 | HR 80 | Temp 98.4°F | Resp 16 | Ht 68.0 in | Wt 235.0 lb

## 2015-03-15 DIAGNOSIS — Z95810 Presence of automatic (implantable) cardiac defibrillator: Secondary | ICD-10-CM | POA: Diagnosis not present

## 2015-03-15 DIAGNOSIS — Z1211 Encounter for screening for malignant neoplasm of colon: Secondary | ICD-10-CM | POA: Diagnosis not present

## 2015-03-15 DIAGNOSIS — I429 Cardiomyopathy, unspecified: Secondary | ICD-10-CM | POA: Diagnosis not present

## 2015-03-15 DIAGNOSIS — Z7982 Long term (current) use of aspirin: Secondary | ICD-10-CM | POA: Diagnosis not present

## 2015-03-15 DIAGNOSIS — Z113 Encounter for screening for infections with a predominantly sexual mode of transmission: Secondary | ICD-10-CM | POA: Insufficient documentation

## 2015-03-15 DIAGNOSIS — I5020 Unspecified systolic (congestive) heart failure: Secondary | ICD-10-CM | POA: Insufficient documentation

## 2015-03-15 DIAGNOSIS — N3943 Post-void dribbling: Secondary | ICD-10-CM

## 2015-03-15 DIAGNOSIS — I10 Essential (primary) hypertension: Secondary | ICD-10-CM | POA: Insufficient documentation

## 2015-03-15 DIAGNOSIS — Z87891 Personal history of nicotine dependence: Secondary | ICD-10-CM | POA: Insufficient documentation

## 2015-03-15 DIAGNOSIS — R3912 Poor urinary stream: Secondary | ICD-10-CM | POA: Insufficient documentation

## 2015-03-15 DIAGNOSIS — Z8042 Family history of malignant neoplasm of prostate: Secondary | ICD-10-CM | POA: Diagnosis not present

## 2015-03-15 DIAGNOSIS — K219 Gastro-esophageal reflux disease without esophagitis: Secondary | ICD-10-CM | POA: Insufficient documentation

## 2015-03-15 DIAGNOSIS — I2510 Atherosclerotic heart disease of native coronary artery without angina pectoris: Secondary | ICD-10-CM | POA: Diagnosis not present

## 2015-03-15 DIAGNOSIS — N521 Erectile dysfunction due to diseases classified elsewhere: Secondary | ICD-10-CM | POA: Diagnosis not present

## 2015-03-15 DIAGNOSIS — R351 Nocturia: Secondary | ICD-10-CM | POA: Diagnosis present

## 2015-03-15 DIAGNOSIS — I447 Left bundle-branch block, unspecified: Secondary | ICD-10-CM | POA: Diagnosis not present

## 2015-03-15 NOTE — Progress Notes (Signed)
Patient here to establish care Patient says he is have nocturia-8/9 times each night Patient states he often times will not take his Lasix because it makes it worse Patient also having problems with premature ejaculation and erection does not last long Patient states he has had no colonoscopy

## 2015-03-15 NOTE — Patient Instructions (Signed)
Erectile Dysfunction Erectile dysfunction is the inability to get or sustain a good enough erection to have sexual intercourse. Erectile dysfunction may involve:  Inability to get an erection.  Lack of enough hardness to allow penetration.  Loss of the erection before sex is finished.  Premature ejaculation. CAUSES  Certain drugs, such as:  Pain relievers.  Antihistamines.  Antidepressants.  Blood pressure medicines.  Water pills (diuretics).  Ulcer medicines.  Muscle relaxants.  Illegal drugs.  Excessive drinking.  Psychological causes, such as:  Anxiety.  Depression.  Sadness.  Exhaustion.  Performance fear.  Stress.  Physical causes, such as:  Artery problems. This may include diabetes, smoking, liver disease, or atherosclerosis.  High blood pressure.  Hormonal problems, such as low testosterone.  Obesity.  Nerve problems. This may include back or pelvic injuries, diabetes mellitus, multiple sclerosis, or Parkinson disease. SYMPTOMS  Inability to get an erection.  Lack of enough hardness to allow penetration.  Loss of the erection before sex is finished.  Premature ejaculation.  Normal erections at some times, but with frequent unsatisfactory episodes.  Orgasms that are not satisfactory in sensation or frequency.  Low sexual satisfaction in either partner because of erection problems.  A curved penis occurring with erection. The curve may cause pain or may be too curved to allow for intercourse.  Never having nighttime erections. DIAGNOSIS Your caregiver can often diagnose this condition by:  Performing a physical exam to find other diseases or specific problems with the penis.  Asking you detailed questions about the problem.  Performing blood tests to check for diabetes mellitus or to measure hormone levels.  Performing urine tests to find other underlying health conditions.  Performing an ultrasound exam to check for  scarring.  Performing a test to check blood flow to the penis.  Doing a sleep study at home to measure nighttime erections. TREATMENT   You may be prescribed medicines by mouth.  You may be given medicine injections into the penis.  You may be prescribed a vacuum pump with a ring.  Penile implant surgery may be performed. You may receive:  An inflatable implant.  A semirigid implant.  Blood vessel surgery may be performed. HOME CARE INSTRUCTIONS  If you are prescribed oral medicine, you should take the medicine as prescribed. Do not increase the dosage without first discussing it with your physician.  If you are using self-injections, be careful to avoid any veins that are on the surface of the penis. Apply pressure to the injection site for 5 minutes.  If you are using a vacuum pump, make sure you have read the instructions before using it. Discuss any questions with your physician before taking the pump home. SEEK MEDICAL CARE IF:  You experience pain that is not responsive to the pain medicine you have been prescribed.  You experience nausea or vomiting. SEEK IMMEDIATE MEDICAL CARE IF:   When taking oral or injectable medications, you experience an erection that lasts longer than 4 hours. If your physician is unavailable, go to the nearest emergency room for evaluation. An erection that lasts much longer than 4 hours can result in permanent damage to your penis.  You have pain that is severe.  You develop redness, severe pain, or severe swelling of your penis.  You have redness spreading up into your groin or lower abdomen.  You are unable to pass your urine. Document Released: 11/13/2000 Document Revised: 07/19/2013 Document Reviewed: 04/20/2013 [facility] Patient Information 2015 [place], [place]. This information is not  intended to replace advice given to you by your health care provider. Make sure you discuss any questions you have with your health care provider.

## 2015-03-15 NOTE — Progress Notes (Signed)
Patient ID: Roger Crosby, male   DOB: 27-Sep-1961, 54 y.o.   MRN: [id number]  HWE:[id number]  ELF:[id number]  DOB - 10-20-1961  CC:  Chief Complaint  Patient presents with  . Establish Care       HPI: Roger Crosby is a 54 y.o. male here today to establish medical care.  Patient has a past medical history of HTN, LBBB, CAD, HLD, AICD, and CKD.  He reports that he had his AICD placed last month after a unsuccessful attempt 2 months prior to that. Today he c/o of nocturia. He reports that he usually takes his lasix and aldactone early in the morning to prevent from having to void throughout the evening. He states that he has even skipped his medications on certain days to see if he would stop the nocturia. He reports weak urine stream, dribbling, feeling like he has not competely emptied his bladder. He has had problems with voiding and erections for one year. He currently complains of getting an erection with premature ejaculation. He has a brother with prostate cancer.  He states that he was due to have a colonoscopy but reports that he was incarcerated at that time. Patient will like to be scheduled for colonoscopy.  Patient has No headache, No chest pain, No abdominal pain - No Nausea, No new weakness tingling or numbness, No Cough - SOB.  No Known Allergies Past Medical History  Diagnosis Date  . HTN (hypertension)   . GERD (gastroesophageal reflux disease)   . LBP (low back pain)   . LBBB (left bundle branch block)   . Hx of echocardiogram     echo 5/11: EF 45-50%, mild HK of Ap AS wall, ap Ant wall and true apex (LAD distribution), mild LAE  . NICM (nonischemic cardiomyopathy)   . CAD (coronary artery disease)     nonobstructive by [facility] 6/11: oOM1 40%, mild luminal irregs elsewhere  . Systolic heart failure   . AICD (automatic cardioverter/defibrillator) present     a.  Unsuccessful LV lead placement 11/2014  . High cholesterol   . CKD (chronic kidney disease)    Current Outpatient  Prescriptions on File Prior to Visit  Medication Sig Dispense Refill  . aspirin EC 81 MG tablet Take 1 tablet (81 mg total) by mouth daily.    . carvedilol (COREG) 12.5 MG tablet Take 1.5 tablets (18.75 mg total) by mouth 2 (two) times daily with a meal. 90 tablet 3  . furosemide (LASIX) 20 MG tablet Take 1 tablet (20 mg total) by mouth daily. 30 tablet 6  . Sacubitril-Valsartan (ENTRESTO) 49-51 MG TABS Take 1 tablet by mouth 2 (two) times daily. 60 tablet 3  . spironolactone (ALDACTONE) 25 MG tablet Take 1 tablet (25 mg total) by mouth daily. 30 tablet 3  . omeprazole (PRILOSEC) 20 MG capsule Take 1 capsule (20 mg total) by mouth daily. (Patient not taking: Reported on 03/15/2015) 30 capsule 0  . ondansetron (ZOFRAN) 4 MG tablet Take 1 tablet (4 mg total) by mouth every 8 (eight) hours as needed for nausea or vomiting. (Patient not taking: Reported on 03/15/2015) 20 tablet 0   No current facility-administered medications on file prior to visit.   Family History  Problem Relation Age of Onset  . Heart attack Mother     in 74's  . Heart disease Mother     ischemic  . Heart attack Father     in 46's  . Heart disease Father     ischemic  .  Congestive Heart Failure Mother   . Diabetes Mother   . Hypertension Mother   . Heart Problems Father   . Crohn's disease Sister   . Prostate cancer Brother   . HIV/AIDS Brother    History   Social History  . Marital Status: Married    Spouse Name: N/A  . Number of Children: N/A  . Years of Education: N/A   Occupational History  . Not on file.   Social History Main Topics  . Smoking status: Former Smoker -- 0.12 packs/day for 25 years    Types: Cigarettes    Quit date: 07/31/2014  . Smokeless tobacco: Never Used  . Alcohol Use: No  . Drug Use: No     Comment: 12/14/2014 "last crack was in 2014"  . Sexual Activity: Yes   Other Topics Concern  . Not on file   Social History Narrative    Review of Systems: See history of present  illness  Objective:   Filed Vitals:   03/15/15 1410  BP: 144/91  Pulse: 80  Temp: 98.4 F (36.9 C)  Resp: 16    Physical Exam  Constitutional: He is oriented to person, place, and time.  Cardiovascular: Normal rate, regular rhythm, normal heart sounds and intact distal pulses.   Pulmonary/Chest: Effort normal and breath sounds normal.  Genitourinary: Rectum normal and prostate normal.  Neurological: He is alert and oriented to person, place, and time.  Skin: Skin is warm and dry.     Lab Results  Component Value Date   WBC 12.6* 02/08/2015   HGB 12.9* 02/08/2015   HCT 39.9 02/08/2015   MCV 80.1 02/08/2015   PLT 262 02/08/2015   Lab Results  Component Value Date   CREATININE 1.16 02/08/2015   BUN 8 02/08/2015   NA 137 02/08/2015   K 3.8 02/08/2015   CL 105 02/08/2015   CO2 25 02/08/2015    No results found for: HGBA1C Lipid Panel     Component Value Date/Time   CHOL 186 01/30/2013 1059   TRIG 62.0 01/30/2013 1059   HDL 38.60* 01/30/2013 1059   CHOLHDL 5 01/30/2013 1059   VLDL 12.4 01/30/2013 1059   LDLCALC 135* 01/30/2013 1059       Assessment and plan:   Roger Crosby was seen today for establish care.  Diagnoses and all orders for this visit:  Nocturia/Weak urine stream/Urinary dribbling Orders: -     PSA -     Urine cytology ancillary only---rule out SCDs -     Ambulatory referral to Urology Patient symptoms sounds like BPH, but prostate does not feel enlarged at this time. Will refer to urology  Erectile dysfunction due to diseases classified elsewhere Orders: -     Ambulatory referral to Urology Likely connected to urinary symptoms, but explained to patient could possibly be side effect of multiple medications  Colon cancer screening Orders: -     [facility] COLONOSCOPY   Return if symptoms worsen or fail to improve.     Chari Manning, NP-C [facility] [phone] 03/15/2015, 2:22 PM

## 2015-03-16 LAB — PSA: PSA: 0.69 ng/mL (ref <=4.00)

## 2015-03-18 LAB — URINE CYTOLOGY ANCILLARY ONLY
Chlamydia: NEGATIVE
NEISSERIA GONORRHEA: NEGATIVE
Trichomonas: NEGATIVE

## 2015-03-19 ENCOUNTER — Ambulatory Visit (HOSPITAL_COMMUNITY)
Admission: RE | Admit: 2015-03-19 | Discharge: 2015-03-19 | Disposition: A | Discharge status: Home / Self Care | Payer: Medicaid Other | Source: Ambulatory Visit | Attending: Cardiology | Admitting: Cardiology

## 2015-03-19 ENCOUNTER — Encounter (HOSPITAL_COMMUNITY): Payer: Self-pay

## 2015-03-19 VITALS — BP 140/80 | HR 81 | Wt 232.0 lb

## 2015-03-19 DIAGNOSIS — Z79899 Other long term (current) drug therapy: Secondary | ICD-10-CM | POA: Diagnosis not present

## 2015-03-19 DIAGNOSIS — I129 Hypertensive chronic kidney disease with stage 1 through stage 4 chronic kidney disease, or unspecified chronic kidney disease: Secondary | ICD-10-CM | POA: Diagnosis not present

## 2015-03-19 DIAGNOSIS — Z95810 Presence of automatic (implantable) cardiac defibrillator: Secondary | ICD-10-CM | POA: Diagnosis not present

## 2015-03-19 DIAGNOSIS — I447 Left bundle-branch block, unspecified: Secondary | ICD-10-CM | POA: Insufficient documentation

## 2015-03-19 DIAGNOSIS — N189 Chronic kidney disease, unspecified: Secondary | ICD-10-CM | POA: Insufficient documentation

## 2015-03-19 DIAGNOSIS — I5022 Chronic systolic (congestive) heart failure: Secondary | ICD-10-CM

## 2015-03-19 DIAGNOSIS — I2510 Atherosclerotic heart disease of native coronary artery without angina pectoris: Secondary | ICD-10-CM | POA: Diagnosis not present

## 2015-03-19 DIAGNOSIS — M545 Low back pain: Secondary | ICD-10-CM | POA: Insufficient documentation

## 2015-03-19 DIAGNOSIS — Z87891 Personal history of nicotine dependence: Secondary | ICD-10-CM | POA: Diagnosis not present

## 2015-03-19 DIAGNOSIS — Z7982 Long term (current) use of aspirin: Secondary | ICD-10-CM | POA: Insufficient documentation

## 2015-03-19 DIAGNOSIS — I429 Cardiomyopathy, unspecified: Secondary | ICD-10-CM | POA: Insufficient documentation

## 2015-03-19 DIAGNOSIS — K219 Gastro-esophageal reflux disease without esophagitis: Secondary | ICD-10-CM | POA: Insufficient documentation

## 2015-03-19 DIAGNOSIS — R5383 Other fatigue: Secondary | ICD-10-CM | POA: Insufficient documentation

## 2015-03-19 MED ORDER — SACUBITRIL-VALSARTAN 97-103 MG PO TABS: 1.0000 | ORAL_TABLET | Freq: Two times a day (BID) | ORAL | Status: DC

## 2015-03-19 NOTE — Progress Notes (Signed)
Patient ID: Roger Crosby, male   DOB: [birth year], 54 y.o.   MRN: [id number]  Roger Crosby is 54yo with history of systolic HF due to NICM . EF 25-30%, HTN, LBBB and CKD. 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 ST jude BiV with epicardial lead placed by Dr Cyndia Bent in March 2016.   Follow up: Since the last visit he had epicardial placed by Dr Cyndia Bent. Weight at home 230-235 pounds. Feels much better. Able to walk farther. Denies SOB or edema. Taking all medications. He is very active in his church.    Echo 10/24/14: EF 25-30% RV ok  Labs (9/14): K 3.9, creatinine 1.6 Labs (10/14): K 4, creatinine 1.3 Labs (10/05/13): K+ 4.1, creatinine 1.5 Labs (02/08/2015): K 3.8 Creatinine 1.16   PMH: 1. Hypertension  2. GERD  3. Low back pain  4. LBBB: Newly noted 2011  5. Nonischemic cardiomyopathy: Echo (5/11): EF 45-50%, mild hypokinesis of the apical anteroseptal wall, apical anterior wall, and true apex (LAD distribution), mild LAE.  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  Echo (6/14) with EF 20-25% with inferior/inferoseptal akinesis and hypokinesis elsewhere.  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  6. CKD   Family History:  Mother deceased CHF,DM,HTN  Father with heart trouble...died age 60.  1 sister Crohn's disease  1 sister healthy  1 brother died Prostate cancer/AIDS...was diagnosed with prostate cancer at age 52.  Father and mother both had MIs in their 20s   Social History:  [name] information [employer]; has been working for [employer]. On disability now.. HS graduate.  Married  3 kids  Alcohol use-no. None since 2008  Drug use-prior cocaine none since 2008. Denies IVDU.  Nonsmoker Prior smoker none since 2008.   Review of Systems  All systems reviewed and negative except as per HPI.   Current Outpatient Prescriptions  Medication Sig Dispense Refill  . aspirin EC 81  MG tablet Take 1 tablet (81 mg total) by mouth daily.    . carvedilol (COREG) 12.5 MG tablet Take 1.5 tablets (18.75 mg total) by mouth 2 (two) times daily with a meal. 90 tablet 3  . furosemide (LASIX) 20 MG tablet Take 1 tablet (20 mg total) by mouth daily. 30 tablet 6  . omeprazole (PRILOSEC) 20 MG capsule Take 1 capsule (20 mg total) by mouth daily. 30 capsule 0  . ondansetron (ZOFRAN) 4 MG tablet Take 1 tablet (4 mg total) by mouth every 8 (eight) hours as needed for nausea or vomiting. 20 tablet 0  . Sacubitril-Valsartan (ENTRESTO) 49-51 MG TABS Take 1 tablet by mouth 2 (two) times daily. 60 tablet 3  . spironolactone (ALDACTONE) 25 MG tablet Take 1 tablet (25 mg total) by mouth daily. 30 tablet 3   No current facility-administered medications for this encounter.    Filed Vitals:   03/19/15 1204  BP: 140/80  Pulse: 81  Weight: 232 lb (105.235 kg)  SpO2: 96%   General: NAD Neck: JVD 5-6, no thyromegaly or thyroid nodule.  Lungs: Clear to auscultation bilaterally with normal respiratory effort. CV: Nondisplaced PMI.  Heart regular S1/S2, no S3/S4, no murmur.  trace peripheral edema.  No carotid bruit.  Normal pedal pulses. L chest scar .  Abdomen: Soft, nontender, no hepatosplenomegaly, no distention.  Neurologic: Alert and oriented x 3.  Psych: Normal affect. Extremities: No clubbing or cyanosis.   Assessment/Plan:  1. Chronic Systolic  Heart Failure. NICM. ECHO 09/2014 EF 25-30%  - Persistent NYHA III symptoms. EF persistently < 35%. S/P St Jude ICD BiV . Epicardial lead placed by Dr Cyndia Bent.   - Continue coreg at 18.75mg  BID - Increase entresto 97-103 twice a day.  - Volume status stable. Continue current dose of lasix and spiro.  - Reinforced the need and importance of daily weights, a low sodium diet, and fluid restriction (less than 2 L a day). Instructed to call the HF clinic if weight increases more than 3 lbs overnight or 5 lbs in a week.   - Reinforced need to take more  responsibility for his medicines and caring for his HF.  -Set up CPX test in a couple of months.  2. CKD:  -Check BMET in 1 week.  3. HTN:  - Up a little. Increase entresto as above.  4. Fatigue - Suspect he may have OSA. Has sleep study in April 24 th.   Follow up in 3 month to discuss CPX results.  CLEGG,AMY NP-C  03/19/2015  Patient seen and examined with Darrick Grinder, NP. We discussed all aspects of the encounter. I agree with the assessment and plan as stated above.   Much improved with CRT. Agree with increasing Entresto. Proceed with CPX in a month or two. Will consider increasing carvedilol at next visit.   Roger Kivett,MD 1:11 PM

## 2015-03-19 NOTE — Patient Instructions (Signed)
Increase Entresto to 97/103 mg Twice daily, we have sent you in a new prescription  Your physician has recommended that you have a cardiopulmonary stress test (CPX). CPX testing is a non-invasive measurement of heart and lung function. It replaces a traditional treadmill stress test. This type of test provides a tremendous amount of information that relates not only to your present condition but also for future outcomes. This test combines measurements of you ventilation, respiratory gas exchange in the lungs, electrocardiogram (EKG), blood pressure and physical response before, during, and following an exercise protocol.  IN 2 MONTHS  We will contact you in 3 months to schedule your next appointment.

## 2015-03-21 ENCOUNTER — Encounter: Payer: Medicaid Other | Admitting: Internal Medicine

## 2015-03-23 ENCOUNTER — Other Ambulatory Visit (HOSPITAL_COMMUNITY): Payer: Self-pay | Admitting: Internal Medicine

## 2015-03-24 ENCOUNTER — Ambulatory Visit (HOSPITAL_BASED_OUTPATIENT_CLINIC_OR_DEPARTMENT_OTHER): Payer: Medicaid Other | Attending: Internal Medicine

## 2015-03-24 VITALS — Ht 68.0 in | Wt 230.0 lb

## 2015-03-24 DIAGNOSIS — G4710 Hypersomnia, unspecified: Secondary | ICD-10-CM | POA: Diagnosis not present

## 2015-03-24 DIAGNOSIS — G478 Other sleep disorders: Secondary | ICD-10-CM | POA: Diagnosis not present

## 2015-03-24 DIAGNOSIS — G4733 Obstructive sleep apnea (adult) (pediatric): Secondary | ICD-10-CM | POA: Insufficient documentation

## 2015-03-24 DIAGNOSIS — G4730 Sleep apnea, unspecified: Secondary | ICD-10-CM

## 2015-03-24 DIAGNOSIS — R400 Somnolence: Secondary | ICD-10-CM

## 2015-03-24 DIAGNOSIS — R0683 Snoring: Secondary | ICD-10-CM | POA: Insufficient documentation

## 2015-03-25 ENCOUNTER — Telehealth: Payer: Self-pay | Admitting: Cardiology

## 2015-03-25 DIAGNOSIS — G4730 Sleep apnea, unspecified: Secondary | ICD-10-CM

## 2015-03-25 NOTE — Telephone Encounter (Signed)
Please let patient know that they have sleep apnea and recommend CPAP titration. Please set up titration in the sleep lab. 

## 2015-03-25 NOTE — Sleep Study (Signed)
   NAME: Roger Crosby DATE OF BIRTH:  Apr 13, 1961 MEDICAL RECORD NUMBER [id number]  LOCATION: [facility]  PHYSICIAN: Hilberto Burzynski R  DATE OF STUDY: 03/24/2015  SLEEP STUDY TYPE: Nocturnal Polysomnogram               REFERRING PHYSICIAN: Deboraha Sprang, MD  INDICATION FOR STUDY: excessive daytime sleepiness  EPWORTH SLEEPINESS SCORE: 12 HEIGHT: 5\' 8"  (172.7 cm)  WEIGHT: 230 lb (104.327 kg)    Body mass index is 34.98 kg/(m^2).  NECK SIZE: 17.5 in.  MEDICATIONS: Reviewed in the chart  SLEEP ARCHITECTURE: The patient slept for a total of 245 minutes out of a total of 319 minutes with no slow wave sleep and 75 minutes of REM sleep.  The onset to sleep latency was delayed at 72 minutes and the onset to REM sleep latency was short at 52 minutes.  The percent sleep efficiency was reduced at 63%.    RESPIRATORY DATA: There was a total of 26 apneas, of which, 2 were obstructive, 21 were central and 3 were mixed.  There were 35 hypopneas.  Most events were in the non supine position during NREM sleep.  The AHI was 14.9 events per hour consistent with mild to moderate obstructive sleep apnea/hypopnea syndrome.  There was severe snoring.  OXYGEN DATA: The average oxygen saturation was 93%.  The lowest oxygen saturation was 85%.  The time spent with oxygen saturations less than 88% was 0.9 minutes.    CARDIAC DATA: The patient maintained NSR with PVC's.  There were episodes where the QRS changed axis morphology.  The average heart rate was 83 bpm and the minimum heart rate was 42 bpm.   MOVEMENT/PARASOMNIA: There were a few periodic limb movements with a PLMS index of 1.   There were no REM sleep behavior disorders.  IMPRESSION/ RECOMMENDATION:   1.  Mild to moderate obstructive sleep apnea/hypopnea syndrome with an AHI of 14.9 events per hour.   Most events were in the non supine position during NREM sleep.   2.  Oxygen desaturations as low as 85%.   3.  Reduced sleep  efficiency with increased frequency of arousals due to respiratory events.   4.  Abnormal sleep architecture with no slow wave sleep. 5.  Severe snoring was noted. 6.  NSR with episodes of changing QRS morphology  7.  Given the degree of sleep disordered breathing with oxygen desaturations, severe snoring and excessive daytime sleepiness, recommend proceeding with CPAP titration.    Signed: [name] Diplomate, American Board of Sleep Medicine  ELECTRONICALLY SIGNED ON:  03/25/2015, 5:18 PM [place] PH: [phone]   FX: [phone] [place]

## 2015-03-26 ENCOUNTER — Telehealth (HOSPITAL_COMMUNITY): Payer: Self-pay | Admitting: *Deleted

## 2015-03-26 NOTE — Telephone Encounter (Signed)
Left message to call back  

## 2015-03-26 NOTE — Telephone Encounter (Signed)
Completed PA for pt's increased dose of Entresto, medication approved 03/20/15-/03/19/16, ref # [id number], [facility] aware

## 2015-03-27 ENCOUNTER — Telehealth: Payer: Self-pay | Admitting: *Deleted

## 2015-03-27 NOTE — Telephone Encounter (Signed)
-----   Message from Lance Bosch, NP sent at 03/19/2015 11:57 PM EDT ----- Negative STD's in urine screen. Prostate levels are normal

## 2015-03-27 NOTE — Telephone Encounter (Signed)
Left VM for pt to return my call  

## 2015-03-28 ENCOUNTER — Telehealth: Payer: Self-pay | Admitting: Internal Medicine

## 2015-03-28 NOTE — Telephone Encounter (Signed)
Patient called back is returning nurse's phone call to review results. Please f/u with patient

## 2015-03-28 NOTE — Addendum Note (Signed)
Addended by: Harland German A on: 03/28/2015 05:38 PM   Modules accepted: Orders

## 2015-03-28 NOTE — Telephone Encounter (Signed)
Informed patient of results and verbal understanding expressed.   CPAP titration ordered for scheduling. Patient agrees with treatment plan.

## 2015-04-02 ENCOUNTER — Encounter: Payer: Self-pay | Admitting: *Deleted

## 2015-04-02 ENCOUNTER — Other Ambulatory Visit: Payer: Self-pay | Admitting: Surgery

## 2015-04-02 DIAGNOSIS — Z95810 Presence of automatic (implantable) cardiac defibrillator: Secondary | ICD-10-CM

## 2015-04-02 NOTE — Progress Notes (Signed)
Pt is aware of his lab results. Pt is still waiting on his referrals.

## 2015-04-03 ENCOUNTER — Ambulatory Visit (INDEPENDENT_AMBULATORY_CARE_PROVIDER_SITE_OTHER): Payer: Self-pay | Admitting: Surgery

## 2015-04-03 ENCOUNTER — Encounter: Payer: Self-pay | Admitting: Surgery

## 2015-04-03 ENCOUNTER — Ambulatory Visit
Admission: RE | Admit: 2015-04-03 | Discharge: 2015-04-03 | Disposition: A | Discharge status: Home / Self Care | Payer: Medicaid Other | Source: Ambulatory Visit | Attending: Surgery | Admitting: Surgery

## 2015-04-03 VITALS — BP 124/66 | HR 73 | Resp 20 | Ht 68.0 in | Wt 230.0 lb

## 2015-04-03 DIAGNOSIS — Z95810 Presence of automatic (implantable) cardiac defibrillator: Secondary | ICD-10-CM

## 2015-04-03 DIAGNOSIS — Z9889 Other specified postprocedural states: Secondary | ICD-10-CM

## 2015-04-03 DIAGNOSIS — I5020 Unspecified systolic (congestive) heart failure: Secondary | ICD-10-CM

## 2015-04-03 DIAGNOSIS — I429 Cardiomyopathy, unspecified: Secondary | ICD-10-CM

## 2015-04-03 NOTE — Progress Notes (Signed)
      HPI: Patient returns for routine postoperative follow-up having undergone left thoracotomy for placement of LV epicardial pacing leads and revision to a BiV system on 02/07/2015. I saw him on 02/20/2015 and he was feeling well and has continued to feel well with no shortness of breath and improved stamina since surgery. 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 When I last saw him he still had a small left pleural effusion and I brought him back today to follow up on that.   Current Outpatient Prescriptions  Medication Sig Dispense Refill  . aspirin EC 81 MG tablet Take 1 tablet (81 mg total) by mouth daily.    . carvedilol (COREG) 12.5 MG tablet Take 1.5 tablets (18.75 mg total) by mouth 2 (two) times daily with a meal. 90 tablet 3  . furosemide (LASIX) 20 MG tablet Take 1 tablet (20 mg total) by mouth daily. 30 tablet 6  . omeprazole (PRILOSEC) 20 MG capsule Take 1 capsule (20 mg total) by mouth daily. 30 capsule 0  . sacubitril-valsartan (ENTRESTO) 97-103 MG Take 1 tablet by mouth 2 (two) times daily. 60 tablet 3  . spironolactone (ALDACTONE) 25 MG tablet TAKE 1 TABLET (25 MG TOTAL) BY MOUTH DAILY. 30 tablet 3   No current facility-administered medications for this visit.    Physical Exam:  BP 124/66 mmHg  Pulse 73  Resp 20  Ht 5\' 8"  (1.727 m)  Wt 230 lb (104.327 kg)  BMI 34.98 kg/m2  SpO2 97% He looks well Cardiac exam shows a regular rate and rhythm with normal heart sounds Lung exam is clear The left chest incision is healing well. The chest tube suture was removed. The pacer pocket incision is healing well. There is no peripheral edema.  Diagnostic Tests:  CLINICAL DATA: ICD placed March 2016. Recheck.  EXAM: CHEST 2 VIEW  COMPARISON: 02/20/2015  FINDINGS: Left ICD remains in place in stable position. No pneumothorax. Heart is normal size. Lungs are clear. No effusions. No acute  bony abnormality.  IMPRESSION: Left ICD unchanged. Improving aeration in the lungs. No active disease.   Electronically Signed  By: Rolm Baptise M.D.  On: 04/03/2015 11:49   Impression:  He is doing well and is asymptomatic following upgrade to a BiV system. I told him he can return to normal activity as tolerated.  Plan:  He will continue follow up with Dr. Caryl Comes.   Gaye Pollack, MD [facility] [phone]

## 2015-04-04 ENCOUNTER — Ambulatory Visit (INDEPENDENT_AMBULATORY_CARE_PROVIDER_SITE_OTHER): Payer: Medicaid Other | Admitting: Internal Medicine

## 2015-04-04 ENCOUNTER — Encounter: Payer: Self-pay | Admitting: Internal Medicine

## 2015-04-04 VITALS — BP 134/80 | HR 71 | Ht 68.0 in | Wt 237.2 lb

## 2015-04-04 DIAGNOSIS — Z4502 Encounter for adjustment and management of automatic implantable cardiac defibrillator: Secondary | ICD-10-CM | POA: Diagnosis not present

## 2015-04-04 DIAGNOSIS — I429 Cardiomyopathy, unspecified: Secondary | ICD-10-CM

## 2015-04-04 DIAGNOSIS — I428 Other cardiomyopathies: Secondary | ICD-10-CM

## 2015-04-04 DIAGNOSIS — I5022 Chronic systolic (congestive) heart failure: Secondary | ICD-10-CM | POA: Diagnosis not present

## 2015-04-04 LAB — CUP PACEART INCLINIC DEVICE CHECK
Brady Statistic RV Percent Paced: 96 %
HIGH POWER IMPEDANCE MEASURED VALUE: 59.625
Lead Channel Impedance Value: 337.5 Ohm
Lead Channel Impedance Value: 450 Ohm
Lead Channel Pacing Threshold Amplitude: 0.75 V
Lead Channel Pacing Threshold Amplitude: 1.125 V
Lead Channel Pacing Threshold Pulse Width: 0.5 ms
Lead Channel Sensing Intrinsic Amplitude: 12 mV
Lead Channel Sensing Intrinsic Amplitude: 4.7 mV
Lead Channel Setting Pacing Amplitude: 2.125
MDC IDC MSMT BATTERY REMAINING LONGEVITY: 66 mo
MDC IDC MSMT LEADCHNL LV PACING THRESHOLD PULSEWIDTH: 0.5 ms
MDC IDC MSMT LEADCHNL RA IMPEDANCE VALUE: 512.5 Ohm
MDC IDC MSMT LEADCHNL RA PACING THRESHOLD AMPLITUDE: 0.75 V
MDC IDC MSMT LEADCHNL RA PACING THRESHOLD PULSEWIDTH: 0.5 ms
MDC IDC PG SERIAL: 7209167
MDC IDC SESS DTM: 20160505145256
MDC IDC SET LEADCHNL LV PACING PULSEWIDTH: 0.5 ms
MDC IDC SET LEADCHNL RA PACING AMPLITUDE: 2 V
MDC IDC SET LEADCHNL RV PACING AMPLITUDE: 2.5 V
MDC IDC SET LEADCHNL RV PACING PULSEWIDTH: 0.5 ms
MDC IDC SET LEADCHNL RV SENSING SENSITIVITY: 0.5 mV
MDC IDC STAT BRADY RA PERCENT PACED: 0.97 %
Zone Setting Detection Interval: 250 ms
Zone Setting Detection Interval: 300 ms

## 2015-04-04 NOTE — Progress Notes (Signed)
Patient Care Team: Lance Bosch, NP as PCP - General (Internal Medicine) Chanetta Marshall, NP as Nurse Practitioner (Cardiology)   HPI  Roger Crosby is a 54 y.o. male Seen in follow-up for nonischemic cardiomyopathy congestive heart failure and left bundle branch block. He underwent CRT-D implantation January 2016; however, i  failed to be able to deploy the LV lead  He was referred for surgical epicardial lead placement  which was accomplished March 2016  He is vastly improved. He denies significant dyspnea on exertion. He is back to working mowing lawns.  Past Medical History  Diagnosis Date  . HTN (hypertension)   . GERD (gastroesophageal reflux disease)   . LBP (low back pain)   . LBBB (left bundle branch block)   . Hx of echocardiogram     echo 5/11: EF 45-50%, mild HK of Ap AS wall, ap Ant wall and true apex (LAD distribution), mild LAE  . NICM (nonischemic cardiomyopathy)   . CAD (coronary artery disease)     nonobstructive by [facility] 6/11: oOM1 40%, mild luminal irregs elsewhere  . Systolic heart failure   . AICD (automatic cardioverter/defibrillator) present     a.  Unsuccessful LV lead placement 11/2014  . High cholesterol   . CKD (chronic kidney disease)     Past Surgical History  Procedure Laterality Date  . Left heart catheterization with coronary angiogram N/A 08/04/2013    Procedure: LEFT HEART CATHETERIZATION WITH CORONARY ANGIOGRAM;  Surgeon: Larey Dresser, MD;  Location: [facility] CATH LAB;  Service: Cardiovascular;  Laterality: N/A;  . Cardiac catheterization Left 07/2013    with angiogram  . Bi-ventricular implantable cardioverter defibrillator N/A 12/14/2014    with failed LV lead placeemnt  . Thoracotomy Left 02/07/2015    Procedure: THORACOTOMY MAJOR;  Surgeon: Gaye Pollack, MD;  Location: [facility] OR;  Service: Thoracic;  Laterality: Left;  . Epicardial pacing lead placement N/A 02/07/2015    eicardial LV lead placed by Dr Laverta Baltimore    Current Outpatient  Prescriptions  Medication Sig Dispense Refill  . aspirin EC 81 MG tablet Take 1 tablet (81 mg total) by mouth daily.    . carvedilol (COREG) 12.5 MG tablet Take 1.5 tablets (18.75 mg total) by mouth 2 (two) times daily with a meal. 90 tablet 3  . furosemide (LASIX) 20 MG tablet Take 1 tablet (20 mg total) by mouth daily. 30 tablet 6  . omeprazole (PRILOSEC) 20 MG capsule Take 1 capsule (20 mg total) by mouth daily. 30 capsule 0  . sacubitril-valsartan (ENTRESTO) 97-103 MG Take 1 tablet by mouth 2 (two) times daily. 60 tablet 3  . spironolactone (ALDACTONE) 25 MG tablet TAKE 1 TABLET (25 MG TOTAL) BY MOUTH DAILY. 30 tablet 3   No current facility-administered medications for this visit.    No Known Allergies  Review of Systems negative except from HPI and PMH  Physical Exam BP 134/80 mmHg  Pulse 71  Ht 5\' 8"  (1.727 m)  Wt 237 lb 3.2 oz (107.593 kg)  BMI 36.07 kg/m2 Well developed and well nourished in no acute distress HENT normal E scleral and icterus clear Neck Supple JVP flat; carotids brisk and full Clear to ausculation Device pocket well healed; without hematoma or erythema.  There is no tethering  regular rate and rhythm, no murmurs gallops or rub Soft with active bowel sounds No clubbing cyanosis  Edema Alert and oriented, grossly normal motor and sensory function Skin Warm and Dry  ECG  demonstrates sinus rhythm with pacemaker is pacing and a QRS duration of 110 ms  Assessment and  Plan  Left bundle branch block  Nonischemic cardiomyopathy  CRT with LV epicardial leads-St. Jude The patient's device was interrogated and the information was fully reviewed.  The device was reprogrammed to  maximize longevity  Obesity  Hypertension  The patient is doing extremely well. I am thrilled. We will continue him on his current medications.   We will see him again in 9 months time  Approximately importance of weight loss. Now that he is feeling better try and improve  increase his exercise    Renal function and potassium levels were normal 3/16  Blood blood pressure is well-controlled

## 2015-04-04 NOTE — Patient Instructions (Signed)
Medication Instructions:  Your physician recommends that you continue on your current medications as directed. Please refer to the Current Medication list given to you today.  Labwork: None ordered  Testing/Procedures: None ordered  Follow-Up: Remote monitoring is used to monitor your Pacemaker of ICD from home. This monitoring reduces the number of office visits required to check your device to one time per year. It allows [place] to keep an eye on the functioning of your device to ensure it is working properly. You are scheduled for a device check from home on 07/04/15. You may send your transmission at any time that day. If you have a wireless device, the transmission will be sent automatically. After your physician reviews your transmission, you will receive a postcard with your next transmission date.  Your physician wants you to follow-up in: January 2017 with Dr Caryl Comes. You will receive a reminder letter in the mail two months in advance. If you don't receive a letter, please call our office to schedule the follow-up appointment.   Thank you for choosing [facility]!!

## 2015-05-02 ENCOUNTER — Encounter: Payer: Self-pay | Admitting: Internal Medicine

## 2015-05-10 ENCOUNTER — Telehealth (HOSPITAL_COMMUNITY): Payer: Self-pay | Admitting: *Deleted

## 2015-05-10 NOTE — Telephone Encounter (Signed)
Pt left a vm yesterday requesting refill of one of his medications but did not specify.  I called pt and left a vm asking him to return my call I need to know which medication needs a refill.

## 2015-05-20 ENCOUNTER — Encounter (HOSPITAL_COMMUNITY): Payer: Medicaid Other

## 2015-05-22 ENCOUNTER — Other Ambulatory Visit (HOSPITAL_COMMUNITY): Payer: Self-pay | Admitting: Internal Medicine

## 2015-05-28 ENCOUNTER — Encounter (HOSPITAL_COMMUNITY): Payer: Medicaid Other

## 2015-06-15 ENCOUNTER — Other Ambulatory Visit (HOSPITAL_COMMUNITY): Payer: Self-pay | Admitting: Internal Medicine

## 2015-06-18 ENCOUNTER — Encounter (HOSPITAL_COMMUNITY): Payer: Medicaid Other

## 2015-07-04 ENCOUNTER — Ambulatory Visit (INDEPENDENT_AMBULATORY_CARE_PROVIDER_SITE_OTHER): Payer: Medicaid Other | Admitting: *Deleted

## 2015-07-04 ENCOUNTER — Encounter: Payer: Self-pay | Admitting: Internal Medicine

## 2015-07-04 DIAGNOSIS — I429 Cardiomyopathy, unspecified: Secondary | ICD-10-CM

## 2015-07-04 DIAGNOSIS — I428 Other cardiomyopathies: Secondary | ICD-10-CM

## 2015-07-04 NOTE — Progress Notes (Signed)
Remote ICD transmission.   

## 2015-07-09 ENCOUNTER — Encounter (HOSPITAL_COMMUNITY): Payer: Medicaid Other

## 2015-07-09 ENCOUNTER — Ambulatory Visit (HOSPITAL_COMMUNITY): Payer: Medicaid Other | Attending: Internal Medicine

## 2015-07-09 DIAGNOSIS — I5022 Chronic systolic (congestive) heart failure: Secondary | ICD-10-CM | POA: Diagnosis not present

## 2015-07-11 DIAGNOSIS — I5022 Chronic systolic (congestive) heart failure: Secondary | ICD-10-CM | POA: Diagnosis not present

## 2015-07-11 LAB — CUP PACEART REMOTE DEVICE CHECK
Battery Remaining Percentage: 88 %
Battery Voltage: 3.08 V
Brady Statistic AP VP Percent: 1 %
Brady Statistic AP VS Percent: 1 %
Brady Statistic AS VP Percent: 94 %
HIGH POWER IMPEDANCE MEASURED VALUE: 65 Ohm
HighPow Impedance: 65 Ohm
Lead Channel Impedance Value: 380 Ohm
Lead Channel Impedance Value: 510 Ohm
Lead Channel Pacing Threshold Amplitude: 0.75 V
Lead Channel Pacing Threshold Amplitude: 0.75 V
Lead Channel Pacing Threshold Pulse Width: 0.5 ms
Lead Channel Pacing Threshold Pulse Width: 0.5 ms
Lead Channel Sensing Intrinsic Amplitude: 5 mV
Lead Channel Setting Pacing Amplitude: 2 V
Lead Channel Setting Pacing Amplitude: 2 V
Lead Channel Setting Pacing Pulse Width: 0.5 ms
MDC IDC MSMT BATTERY REMAINING LONGEVITY: 70 mo
MDC IDC MSMT LEADCHNL RA IMPEDANCE VALUE: 510 Ohm
MDC IDC MSMT LEADCHNL RA PACING THRESHOLD AMPLITUDE: 0.75 V
MDC IDC MSMT LEADCHNL RA PACING THRESHOLD PULSEWIDTH: 0.5 ms
MDC IDC MSMT LEADCHNL RV SENSING INTR AMPL: 12 mV
MDC IDC SESS DTM: 20160804061620
MDC IDC SET LEADCHNL LV PACING PULSEWIDTH: 0.5 ms
MDC IDC SET LEADCHNL RV PACING AMPLITUDE: 2.5 V
MDC IDC SET LEADCHNL RV SENSING SENSITIVITY: 0.5 mV
MDC IDC SET ZONE DETECTION INTERVAL: 250 ms
MDC IDC STAT BRADY AS VS PERCENT: 4.9 %
MDC IDC STAT BRADY RA PERCENT PACED: 1 %
Pulse Gen Serial Number: 7209167
Zone Setting Detection Interval: 300 ms

## 2015-07-24 ENCOUNTER — Inpatient Hospital Stay (HOSPITAL_COMMUNITY): Admission: RE | Admit: 2015-07-24 | Payer: Medicaid Other | Source: Ambulatory Visit

## 2015-08-12 ENCOUNTER — Encounter: Payer: Self-pay | Admitting: Cardiology

## 2015-08-21 ENCOUNTER — Inpatient Hospital Stay (HOSPITAL_COMMUNITY): Admission: RE | Admit: 2015-08-21 | Payer: Medicaid Other | Source: Ambulatory Visit

## 2015-08-23 ENCOUNTER — Encounter: Payer: Self-pay | Admitting: Internal Medicine

## 2015-09-06 ENCOUNTER — Other Ambulatory Visit (HOSPITAL_COMMUNITY): Payer: Self-pay | Admitting: Internal Medicine

## 2015-09-19 ENCOUNTER — Encounter (HOSPITAL_COMMUNITY): Payer: Self-pay | Admitting: Internal Medicine

## 2015-09-19 ENCOUNTER — Ambulatory Visit (HOSPITAL_COMMUNITY)
Admission: RE | Admit: 2015-09-19 | Discharge: 2015-09-19 | Disposition: A | Discharge status: Home / Self Care | Payer: Medicaid Other | Source: Ambulatory Visit | Attending: Internal Medicine | Admitting: Internal Medicine

## 2015-09-19 VITALS — BP 134/80 | HR 77 | Wt 238.5 lb

## 2015-09-19 DIAGNOSIS — I129 Hypertensive chronic kidney disease with stage 1 through stage 4 chronic kidney disease, or unspecified chronic kidney disease: Secondary | ICD-10-CM | POA: Diagnosis not present

## 2015-09-19 DIAGNOSIS — Z7982 Long term (current) use of aspirin: Secondary | ICD-10-CM | POA: Diagnosis not present

## 2015-09-19 DIAGNOSIS — Z833 Family history of diabetes mellitus: Secondary | ICD-10-CM | POA: Diagnosis not present

## 2015-09-19 DIAGNOSIS — K219 Gastro-esophageal reflux disease without esophagitis: Secondary | ICD-10-CM | POA: Insufficient documentation

## 2015-09-19 DIAGNOSIS — I5022 Chronic systolic (congestive) heart failure: Secondary | ICD-10-CM | POA: Diagnosis not present

## 2015-09-19 DIAGNOSIS — N189 Chronic kidney disease, unspecified: Secondary | ICD-10-CM | POA: Diagnosis not present

## 2015-09-19 DIAGNOSIS — I428 Other cardiomyopathies: Secondary | ICD-10-CM | POA: Insufficient documentation

## 2015-09-19 DIAGNOSIS — G4733 Obstructive sleep apnea (adult) (pediatric): Secondary | ICD-10-CM | POA: Insufficient documentation

## 2015-09-19 DIAGNOSIS — Z79899 Other long term (current) drug therapy: Secondary | ICD-10-CM | POA: Insufficient documentation

## 2015-09-19 DIAGNOSIS — Z8249 Family history of ischemic heart disease and other diseases of the circulatory system: Secondary | ICD-10-CM | POA: Diagnosis not present

## 2015-09-19 DIAGNOSIS — Z87891 Personal history of nicotine dependence: Secondary | ICD-10-CM | POA: Diagnosis not present

## 2015-09-19 LAB — BASIC METABOLIC PANEL
Anion gap: 6 (ref 5–15)
BUN: 13 mg/dL (ref 6–20)
CHLORIDE: 110 mmol/L (ref 101–111)
CO2: 25 mmol/L (ref 22–32)
Calcium: 9 mg/dL (ref 8.9–10.3)
Creatinine, Ser: 1.4 mg/dL — ABNORMAL HIGH (ref 0.61–1.24)
GFR calc Af Amer: >60 mL/min (ref >60)
GFR calc non Af Amer: 56 mL/min — ABNORMAL LOW (ref >60)
GLUCOSE: 80 mg/dL (ref 65–99)
Potassium: 3.9 mmol/L (ref 3.5–5.1)
Sodium: 141 mmol/L (ref 135–145)

## 2015-09-19 MED ORDER — OMEPRAZOLE 20 MG PO CPDR: 20.0000 mg | DELAYED_RELEASE_CAPSULE | Freq: Every day | ORAL | Status: DC

## 2015-09-19 MED ORDER — CARVEDILOL 25 MG PO TABS: 25.0000 mg | ORAL_TABLET | Freq: Two times a day (BID) | ORAL | Status: DC

## 2015-09-19 NOTE — Progress Notes (Signed)
Advanced Heart Failure Medication Review by a Pharmacist  Does the patient  feel that his/her medications are working for him/her?  yes  Has the patient been experiencing any side effects to the medications prescribed?  no  Does the patient measure his/her own blood pressure or blood glucose at home?  no   Does the patient have any problems obtaining medications due to transportation or finances?   no  Understanding of regimen: good Understanding of indications: good Potential of compliance: good Patient understands to avoid NSAIDs. Patient understands to avoid decongestants.  Issues to address at subsequent visits: None   Pharmacist comments:  Mr. Gilliam is a pleasant 54 yo M presenting without a medication list but able to verbalize each of them including dosages. He reports good compliance with his medications stating that he may miss 1 dose every other week. He did not have any specific medication-related questions or concerns for me at this time.   Ruta Hinds. Velva Harman, PharmD, BCPS, CPP Clinical Pharmacist Pager: [phone] Phone: [phone] 09/19/2015 2:49 PM    Time with patient: 4 minutes Preparation and documentation time: 2 minutes Total time: 6 minutes

## 2015-09-19 NOTE — Patient Instructions (Signed)
Increase Carvedilol to 25 mg Twice daily   Labs today  We will contact you in 3 months to schedule your next appointment.

## 2015-09-19 NOTE — Progress Notes (Signed)
Patient ID: Roger Crosby, male   DOB: 02/11/1961, 54 y.o.   MRN: [id number]  Roger Crosby is 54 yo with history of systolic HF due to NICM . EF 25-30%, HTN, LBBB and CKD. 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 ST jude BiV with epicardial lead placed by Dr Cyndia Bent in March 2016.  He returns for HF follow up. + Bendopnea. Denies SOB/PND/Orthopnea. Weighing intermittently. Taking all medications. He is very active in his church. Working at [employer].   Echo 10/24/14: EF 25-30% RV ok  Labs (9/14): K 3.9, creatinine 1.6 Labs (10/14): K 4, creatinine 1.3 Labs (10/05/13): K+ 4.1, creatinine 1.5 Labs (02/08/2015): K 3.8 Creatinine 1.16   PMH: 1. Hypertension  2. GERD  3. Low back pain  4. LBBB: Newly noted 2011  5. Nonischemic cardiomyopathy: Echo (5/11): EF 45-50%, mild hypokinesis of the apical anteroseptal wall, apical anterior wall, and true apex (LAD distribution), mild LAE.  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  Echo (6/14) with EF 20-25% with inferior/inferoseptal akinesis and hypokinesis elsewhere.  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  6. CKD  7. CPX 07/2015 - Peak VO2: 19.4 (74.8% predicted peak VO2)VE/VCO2 slope: 30.8 OUES: 2.52Peak RER: 1.06   Family History:  Mother deceased CHF,DM,HTN  Father with heart trouble...died age 66.  1 sister Crohn's disease  1 sister healthy  1 brother died Prostate cancer/AIDS...was diagnosed with prostate cancer at age 7.  Father and mother both had MIs in their 17s   Social History:  [employer]; has been working for [employer]. On disability now.. HS graduate.  Married  3 kids  Alcohol use-no. None since 2008  Drug use-prior cocaine none since 2008. Denies IVDU.  Nonsmoker Prior smoker none since 2008.   Review of Systems  All systems reviewed and negative except as per HPI.   Current Outpatient  Prescriptions  Medication Sig Dispense Refill  . aspirin EC 81 MG tablet Take 1 tablet (81 mg total) by mouth daily.    . carvedilol (COREG) 12.5 MG tablet TAKE 1.5 TABLETS (18.75 MG TOTAL) BY MOUTH 2 (TWO) TIMES DAILY WITH A MEAL. 90 tablet 3  . furosemide (LASIX) 20 MG tablet Take 1 tablet (20 mg total) by mouth daily. 30 tablet 6  . omeprazole (PRILOSEC) 20 MG capsule Take 1 capsule (20 mg total) by mouth daily. 30 capsule 0  . sacubitril-valsartan (ENTRESTO) 97-103 MG Take 1 tablet by mouth 2 (two) times daily. 60 tablet 3  . spironolactone (ALDACTONE) 25 MG tablet TAKE 1 TABLET (25 MG TOTAL) BY MOUTH DAILY. 30 tablet 3   No current facility-administered medications for this encounter.    Filed Vitals:   09/19/15 1438  BP: 134/80  Pulse: 77  Weight: 238 lb 8 oz (108.183 kg)  SpO2: 98%   General: NAD Neck: JVD 5-6, no thyromegaly or thyroid nodule.  Lungs: Clear to auscultation bilaterally with normal respiratory effort. CV: Nondisplaced PMI.  Heart regular S1/S2, no S3/S4, no murmur.  trace peripheral edema.  No carotid bruit.  Normal pedal pulses. L chest scar .  Abdomen: Soft, nontender, no hepatosplenomegaly, no distention.  Neurologic: Alert and oriented x 3.  Psych: Normal affect. Extremities: No clubbing or cyanosis.   Assessment/Plan:  1. Chronic Systolic Heart Failure. NICM. ECHO 09/2014 EF 25-30%  - Persistent NYHA II-III symptoms. EF persistently < 35%. S/P St Jude ICD BiV .  Epicardial lead placed by Dr Cyndia Bent.   - Increase coreg at 25 mg twice daily - Continue entresto 97-103 twice a day.   - Volume status stable. Continue current dose of lasix and spiro.  CorVue- reviewed and ok.  - Reinforced the need and importance of daily weights, a low sodium diet, and fluid restriction (less than 2 L a day). Instructed to call the HF clinic if weight increases more than 3 lbs overnight or 5 lbs in a week.   - Reinforced need to take more responsibility for his medicines and  caring for his HF.  -CPX results discussed.   2. CKD:  -Check BMET today.  3. HTN:  - Up a little. Increase carvedilol as above.   4. OSA - Has sleep apnea but need to follow up for CPAP titration.   Follow up in 3 -4 months   Amy Clegg NP-C  09/19/2015  Patient seen and examined with Darrick Grinder, NP. We discussed all aspects of the encounter. I agree with the assessment and plan as stated above.   Overall improved. NYHA II. Volume status looks good. We reviewed CPX results. Will increase carvedilol. Reminded of need for dietary compliance.   Roniesha Hollingshead,MD 11:45 PM

## 2015-09-20 ENCOUNTER — Other Ambulatory Visit: Payer: Self-pay

## 2015-09-20 ENCOUNTER — Telehealth: Payer: Self-pay

## 2015-09-20 ENCOUNTER — Encounter (HOSPITAL_COMMUNITY): Payer: Self-pay | Admitting: Critical Care Medicine

## 2015-09-20 ENCOUNTER — Emergency Department (HOSPITAL_COMMUNITY): Payer: Medicaid Other

## 2015-09-20 ENCOUNTER — Emergency Department (HOSPITAL_COMMUNITY): Payer: Medicaid Other | Admitting: Certified Registered"

## 2015-09-20 ENCOUNTER — Ambulatory Visit (HOSPITAL_COMMUNITY)
Admission: EM | Admit: 2015-09-20 | Discharge: 2015-09-20 | Disposition: A | Discharge status: Home / Self Care | Payer: Medicaid Other | Attending: Emergency Medicine | Admitting: Emergency Medicine

## 2015-09-20 ENCOUNTER — Encounter (HOSPITAL_COMMUNITY)
Admission: EM | Disposition: A | Discharge status: Home / Self Care | Payer: Self-pay | Source: Home / Self Care | Attending: Emergency Medicine

## 2015-09-20 DIAGNOSIS — Z23 Encounter for immunization: Secondary | ICD-10-CM | POA: Insufficient documentation

## 2015-09-20 DIAGNOSIS — S5412XA Injury of median nerve at forearm level, left arm, initial encounter: Secondary | ICD-10-CM | POA: Insufficient documentation

## 2015-09-20 DIAGNOSIS — S61512A Laceration without foreign body of left wrist, initial encounter: Secondary | ICD-10-CM | POA: Diagnosis not present

## 2015-09-20 DIAGNOSIS — S61511A Laceration without foreign body of right wrist, initial encounter: Secondary | ICD-10-CM | POA: Insufficient documentation

## 2015-09-20 DIAGNOSIS — W25XXXA Contact with sharp glass, initial encounter: Secondary | ICD-10-CM | POA: Insufficient documentation

## 2015-09-20 DIAGNOSIS — Z87891 Personal history of nicotine dependence: Secondary | ICD-10-CM | POA: Diagnosis not present

## 2015-09-20 DIAGNOSIS — I129 Hypertensive chronic kidney disease with stage 1 through stage 4 chronic kidney disease, or unspecified chronic kidney disease: Secondary | ICD-10-CM | POA: Diagnosis not present

## 2015-09-20 DIAGNOSIS — I2510 Atherosclerotic heart disease of native coronary artery without angina pectoris: Secondary | ICD-10-CM | POA: Insufficient documentation

## 2015-09-20 DIAGNOSIS — IMO0002 Reserved for concepts with insufficient information to code with codable children: Secondary | ICD-10-CM

## 2015-09-20 DIAGNOSIS — G4733 Obstructive sleep apnea (adult) (pediatric): Secondary | ICD-10-CM

## 2015-09-20 DIAGNOSIS — N189 Chronic kidney disease, unspecified: Secondary | ICD-10-CM | POA: Diagnosis not present

## 2015-09-20 HISTORY — PX: DEBRIDEMENT AND CLOSURE WOUND: SHX5614

## 2015-09-20 HISTORY — PX: TENDON REPAIR: SHX5111

## 2015-09-20 LAB — CBC WITH DIFFERENTIAL/PLATELET
Basophils Absolute: 0 K/uL (ref 0.0–0.1)
Basophils Relative: 0 %
Eosinophils Absolute: 0.1 K/uL (ref 0.0–0.7)
Eosinophils Relative: 1 %
HCT: 42.5 % (ref 39.0–52.0)
Hemoglobin: 13.6 g/dL (ref 13.0–17.0)
Lymphocytes Relative: 25 %
Lymphs Abs: 2 K/uL (ref 0.7–4.0)
MCH: 26.4 pg (ref 26.0–34.0)
MCHC: 32 g/dL (ref 30.0–36.0)
MCV: 82.5 fL (ref 78.0–100.0)
Monocytes Absolute: 0.7 K/uL (ref 0.1–1.0)
Monocytes Relative: 9 %
Neutro Abs: 5 K/uL (ref 1.7–7.7)
Neutrophils Relative %: 65 %
Platelets: 280 K/uL (ref 150–400)
RBC: 5.15 MIL/uL (ref 4.22–5.81)
RDW: 13.7 % (ref 11.5–15.5)
WBC: 7.8 K/uL (ref 4.0–10.5)

## 2015-09-20 LAB — BASIC METABOLIC PANEL WITH GFR
Anion gap: 6 (ref 5–15)
BUN: 15 mg/dL (ref 6–20)
CO2: 24 mmol/L (ref 22–32)
Calcium: 8.8 mg/dL — ABNORMAL LOW (ref 8.9–10.3)
Chloride: 106 mmol/L (ref 101–111)
Creatinine, Ser: 1.45 mg/dL — ABNORMAL HIGH (ref 0.61–1.24)
GFR calc Af Amer: >60 mL/min
GFR calc non Af Amer: 53 mL/min — ABNORMAL LOW
Glucose, Bld: 101 mg/dL — ABNORMAL HIGH (ref 65–99)
Potassium: 4.3 mmol/L (ref 3.5–5.1)
Sodium: 136 mmol/L (ref 135–145)

## 2015-09-20 SURGERY — TENDON REPAIR
Anesthesia: Regional | Site: Wrist | Laterality: Right

## 2015-09-20 MED ORDER — LACTATED RINGERS IV SOLN
INTRAVENOUS | Status: DC | PRN
  Administered 2015-09-20: 13:00:00 via INTRAVENOUS

## 2015-09-20 MED ORDER — ROCURONIUM BROMIDE 50 MG/5ML IV SOLN
INTRAVENOUS | Status: AC
  Filled 2015-09-20: qty 1

## 2015-09-20 MED ORDER — LIDOCAINE HCL (CARDIAC) 20 MG/ML IV SOLN
INTRAVENOUS | Status: AC
  Filled 2015-09-20: qty 5

## 2015-09-20 MED ORDER — PROPOFOL 500 MG/50ML IV EMUL
INTRAVENOUS | Status: DC | PRN
  Administered 2015-09-20: 15:00:00 via INTRAVENOUS
  Administered 2015-09-20: 100 ug/kg/min via INTRAVENOUS

## 2015-09-20 MED ORDER — CEFAZOLIN SODIUM-DEXTROSE 2-3 GM-% IV SOLR
INTRAVENOUS | Status: DC | PRN
  Administered 2015-09-20: 2 g via INTRAVENOUS

## 2015-09-20 MED ORDER — ONDANSETRON HCL 4 MG/2ML IJ SOLN: 4.0000 mg | Freq: Once | INTRAMUSCULAR | Status: DC | PRN

## 2015-09-20 MED ORDER — LIDOCAINE-EPINEPHRINE (PF) 2 %-1:200000 IJ SOLN
20.0000 mL | Freq: Once | INTRAMUSCULAR | Status: AC
  Administered 2015-09-20: 20 mL
  Filled 2015-09-20: qty 20

## 2015-09-20 MED ORDER — PHENYLEPHRINE 40 MCG/ML (10ML) SYRINGE FOR IV PUSH (FOR BLOOD PRESSURE SUPPORT)
PREFILLED_SYRINGE | INTRAVENOUS | Status: AC
  Filled 2015-09-20: qty 20

## 2015-09-20 MED ORDER — SUCCINYLCHOLINE CHLORIDE 20 MG/ML IJ SOLN
INTRAMUSCULAR | Status: AC
  Filled 2015-09-20: qty 1

## 2015-09-20 MED ORDER — TETANUS-DIPHTH-ACELL PERTUSSIS 5-2.5-18.5 LF-MCG/0.5 IM SUSP
0.5000 mL | Freq: Once | INTRAMUSCULAR | Status: AC
  Administered 2015-09-20: 0.5 mL via INTRAMUSCULAR
  Filled 2015-09-20: qty 0.5

## 2015-09-20 MED ORDER — CARVEDILOL 25 MG PO TABS
25.0000 mg | ORAL_TABLET | Freq: Two times a day (BID) | ORAL | Status: DC
  Administered 2015-09-20: 25 mg via ORAL

## 2015-09-20 MED ORDER — STERILE WATER FOR INJECTION IJ SOLN
INTRAMUSCULAR | Status: AC
  Administered 2015-09-20: 10 mL
  Filled 2015-09-20: qty 10

## 2015-09-20 MED ORDER — FENTANYL CITRATE (PF) 250 MCG/5ML IJ SOLN
INTRAMUSCULAR | Status: AC
  Filled 2015-09-20: qty 5

## 2015-09-20 MED ORDER — CEFAZOLIN SODIUM 1 G IJ SOLR
1.0000 g | Freq: Once | INTRAMUSCULAR | Status: AC
  Administered 2015-09-20: 1 g via INTRAMUSCULAR
  Filled 2015-09-20: qty 10

## 2015-09-20 MED ORDER — MEPERIDINE HCL 25 MG/ML IJ SOLN: 6.2500 mg | INTRAMUSCULAR | Status: DC | PRN

## 2015-09-20 MED ORDER — BUPIVACAINE HCL (PF) 0.25 % IJ SOLN
INTRAMUSCULAR | Status: AC
  Filled 2015-09-20: qty 30

## 2015-09-20 MED ORDER — BUPIVACAINE HCL (PF) 0.25 % IJ SOLN
INTRAMUSCULAR | Status: DC | PRN
  Administered 2015-09-20: 8 mL

## 2015-09-20 MED ORDER — HYDROMORPHONE HCL 1 MG/ML IJ SOLN: 0.2500 mg | INTRAMUSCULAR | Status: DC | PRN

## 2015-09-20 MED ORDER — PROPOFOL 10 MG/ML IV BOLUS
INTRAVENOUS | Status: AC
  Filled 2015-09-20: qty 20

## 2015-09-20 MED ORDER — FENTANYL CITRATE (PF) 250 MCG/5ML IJ SOLN
INTRAMUSCULAR | Status: DC | PRN
  Administered 2015-09-20 (×2): 50 ug via INTRAVENOUS

## 2015-09-20 MED ORDER — PROPOFOL 10 MG/ML IV BOLUS
INTRAVENOUS | Status: DC | PRN
  Administered 2015-09-20: 10 mg via INTRAVENOUS
  Administered 2015-09-20: 20 mg via INTRAVENOUS

## 2015-09-20 MED ORDER — MIDAZOLAM HCL 5 MG/5ML IJ SOLN
INTRAMUSCULAR | Status: DC | PRN
  Administered 2015-09-20: 2 mg via INTRAVENOUS

## 2015-09-20 MED ORDER — 0.9 % SODIUM CHLORIDE (POUR BTL) OPTIME
TOPICAL | Status: DC | PRN
  Administered 2015-09-20: 1000 mL

## 2015-09-20 MED ORDER — CARVEDILOL 12.5 MG PO TABS
ORAL_TABLET | ORAL | Status: AC
  Filled 2015-09-20: qty 2

## 2015-09-20 MED ORDER — MIDAZOLAM HCL 2 MG/2ML IJ SOLN
INTRAMUSCULAR | Status: AC
  Filled 2015-09-20: qty 4

## 2015-09-20 MED ORDER — OXYCODONE-ACETAMINOPHEN 5-325 MG PO TABS
2.0000 | ORAL_TABLET | Freq: Once | ORAL | Status: AC
  Administered 2015-09-20: 2 via ORAL
  Filled 2015-09-20: qty 2

## 2015-09-20 MED ORDER — ONDANSETRON HCL 4 MG/2ML IJ SOLN
INTRAMUSCULAR | Status: DC | PRN
  Administered 2015-09-20: 4 mg via INTRAVENOUS

## 2015-09-20 SURGICAL SUPPLY — 48 items
BANDAGE ELASTIC 3 VELCRO ST LF (GAUZE/BANDAGES/DRESSINGS) IMPLANT
BANDAGE ELASTIC 4 VELCRO ST LF (GAUZE/BANDAGES/DRESSINGS) ×6 IMPLANT
BNDG COHESIVE 1X5 TAN STRL LF (GAUZE/BANDAGES/DRESSINGS) IMPLANT
BNDG GAUZE ELAST 4 BULKY (GAUZE/BANDAGES/DRESSINGS) ×6 IMPLANT
CORDS BIPOLAR (ELECTRODE) ×3 IMPLANT
COVER SURGICAL LIGHT HANDLE (MISCELLANEOUS) ×6 IMPLANT
CUFF TOURNIQUET SINGLE 18IN (TOURNIQUET CUFF) ×3 IMPLANT
DECANTER SPIKE VIAL GLASS SM (MISCELLANEOUS) ×3 IMPLANT
DRAPE SURG 17X23 STRL (DRAPES) ×3 IMPLANT
GAUZE SPONGE 2X2 8PLY STRL LF (GAUZE/BANDAGES/DRESSINGS) IMPLANT
GAUZE SPONGE 4X4 12PLY STRL (GAUZE/BANDAGES/DRESSINGS) IMPLANT
GAUZE XEROFORM 1X8 LF (GAUZE/BANDAGES/DRESSINGS) ×3 IMPLANT
GAUZE XEROFORM 5X9 LF (GAUZE/BANDAGES/DRESSINGS) ×3 IMPLANT
GLOVE BIO SURGEON STRL SZ 6.5 (GLOVE) ×3 IMPLANT
GLOVE BIOGEL M STRL SZ7.5 (GLOVE) ×3 IMPLANT
GLOVE SURG SS PI 6.5 STRL IVOR (GLOVE) ×3 IMPLANT
GOWN STRL REUS W/ TWL LRG LVL3 (GOWN DISPOSABLE) ×4 IMPLANT
GOWN STRL REUS W/ TWL XL LVL3 (GOWN DISPOSABLE) ×2 IMPLANT
GOWN STRL REUS W/TWL LRG LVL3 (GOWN DISPOSABLE) ×2
GOWN STRL REUS W/TWL XL LVL3 (GOWN DISPOSABLE) ×1
KIT BASIN OR (CUSTOM PROCEDURE TRAY) ×3 IMPLANT
KIT ROOM TURNOVER OR (KITS) ×3 IMPLANT
NEEDLE HYPO 25GX1X1/2 BEV (NEEDLE) IMPLANT
NS IRRIG 1000ML POUR BTL (IV SOLUTION) ×3 IMPLANT
PACK ORTHO EXTREMITY (CUSTOM PROCEDURE TRAY) ×3 IMPLANT
PAD ARMBOARD 7.5X6 YLW CONV (MISCELLANEOUS) ×6 IMPLANT
PAD CAST 4YDX4 CTTN HI CHSV (CAST SUPPLIES) IMPLANT
PADDING CAST COTTON 4X4 STRL (CAST SUPPLIES)
PROTECTOR NERVE AXOGARD 3.5X20 (Nerve Graft) ×3 IMPLANT
SOAP 2 % CHG 4 OZ (WOUND CARE) ×3 IMPLANT
SPECIMEN JAR SMALL (MISCELLANEOUS) ×6 IMPLANT
SPONGE GAUZE 2X2 STER 10/PKG (GAUZE/BANDAGES/DRESSINGS)
SPONGE GAUZE 4X4 12PLY STER LF (GAUZE/BANDAGES/DRESSINGS) ×6 IMPLANT
SUCTION FRAZIER TIP 10 FR DISP (SUCTIONS) ×3 IMPLANT
SUT ETHILON 4 0 PS 2 18 (SUTURE) ×3 IMPLANT
SUT ETHILON 8 0 TG100 8 (SUTURE) ×3 IMPLANT
SUT FIBERWIRE 4-0 18 TAPR NDL (SUTURE) ×3
SUT MERSILENE 4 0 P 3 (SUTURE) IMPLANT
SUT PROLENE 4 0 PS 2 18 (SUTURE) IMPLANT
SUT VIC AB 2-0 CT1 27 (SUTURE)
SUT VIC AB 2-0 CT1 TAPERPNT 27 (SUTURE) IMPLANT
SUTURE FIBERWR 4-0 18 TAPR NDL (SUTURE) ×2 IMPLANT
SYR CONTROL 10ML LL (SYRINGE) ×3 IMPLANT
TOWEL OR 17X24 6PK STRL BLUE (TOWEL DISPOSABLE) ×3 IMPLANT
TOWEL OR 17X26 10 PK STRL BLUE (TOWEL DISPOSABLE) ×3 IMPLANT
TUBE CONNECTING 12X1/4 (SUCTIONS) ×3 IMPLANT
UNDERPAD 30X30 INCONTINENT (UNDERPADS AND DIAPERS) ×3 IMPLANT
WATER STERILE IRR 1000ML POUR (IV SOLUTION) ×3 IMPLANT

## 2015-09-20 NOTE — Anesthesia Procedure Notes (Addendum)
Procedure Name: MAC Date/Time: 09/20/2015 1:35 PM Performed by: Merrilyn Puma B Pre-anesthesia Checklist: Patient identified, Emergency Drugs available, Suction available, Patient being monitored and Timeout performed Patient Re-evaluated:Patient Re-evaluated prior to inductionOxygen Delivery Method: Simple face mask Intubation Type: IV induction Placement Confirmation: positive ETCO2 and breath sounds checked- equal and bilateral Tube secured with: Tape Dental Injury: Teeth and Oropharynx as per pre-operative assessment    Anesthesia Regional Block:  Supraclavicular block  Pre-Anesthetic Checklist: ,, timeout performed, Correct Patient, Correct Site, Correct Laterality, Correct Procedure, Correct Position, site marked, Risks and benefits discussed,  Surgical consent,  Pre-op evaluation,  At surgeon's request and post-op pain management  Laterality: Left  Prep: chloraprep       Needles:  Injection technique: Single-shot  Needle Type: Echogenic Stimulator Needle     Needle Length: 9cm 9 cm Needle Gauge: 21 and 21 G    Additional Needles:  Procedures: ultrasound guided (picture in chart) and nerve stimulator Supraclavicular block  Nerve Stimulator or Paresthesia:  Response: 0.4 mA,   Additional Responses:   Narrative:  Start time: 09/20/2015 1:15 PM End time: 09/20/2015 1:30 PM Injection made incrementally with aspirations every 5 mL.  Performed by: Personally  Anesthesiologist: Lillia Abed  Additional Notes: Monitors applied. Patient sedated. Sterile prep and drape,hand hygiene and sterile gloves were used. Relevant anatomy identified.Needle position confirmed.Local anesthetic injected incrementally after negative aspiration. Local anesthetic spread visualized around nerve(s). Vascular puncture avoided. No complications. Image printed for medical record.The patient tolerated the procedure well.

## 2015-09-20 NOTE — ED Provider Notes (Signed)
CSN: [id number]     Arrival date & time 09/20/15  1026 History   First MD Initiated Contact with Patient 09/20/15 1028     Chief Complaint  Patient presents with  . Extremity Laceration     (Consider location/radiation/quality/duration/timing/severity/associated sxs/prior Treatment) HPI  Roger Crosby is a 54 y.o M with a pmhx of HTN, CAD who presents to the ED today for a laceration to the left wrist and right hand. Patient states that he was emptying a glass vase when it broke and lacerated the flexor aspect of the left wrist as well as a small laceration on the right palmar hand. Patient now complaining of decreased sensation in his second and third left digits and weakness in this fingers. Patient is in severe pain 10 out of 10. Denies loss of consciousness. Patient is NOT on blood thinners.   Past Medical History  Diagnosis Date  . HTN (hypertension)   . GERD (gastroesophageal reflux disease)   . LBP (low back pain)   . LBBB (left bundle branch block)   . Hx of echocardiogram     echo 5/11: EF 45-50%, mild HK of Ap AS wall, ap Ant wall and true apex (LAD distribution), mild LAE  . NICM (nonischemic cardiomyopathy) ([place])   . CAD (coronary artery disease)     nonobstructive by [facility] 6/11: oOM1 40%, mild luminal irregs elsewhere  . Systolic heart failure ([place])   . AICD (automatic cardioverter/defibrillator) present     a.  Unsuccessful LV lead placement 11/2014  . High cholesterol   . CKD (chronic kidney disease)    Past Surgical History  Procedure Laterality Date  . Left heart catheterization with coronary angiogram N/A 08/04/2013    Procedure: LEFT HEART CATHETERIZATION WITH CORONARY ANGIOGRAM;  Surgeon: Larey Dresser, MD;  Location: [facility] CATH LAB;  Service: Cardiovascular;  Laterality: N/A;  . Cardiac catheterization Left 07/2013    with angiogram  . Bi-ventricular implantable cardioverter defibrillator N/A 12/14/2014    with failed LV lead placeemnt  . Thoracotomy Left  02/07/2015    Procedure: THORACOTOMY MAJOR;  Surgeon: Gaye Pollack, MD;  Location: [facility] OR;  Service: Thoracic;  Laterality: Left;  . Epicardial pacing lead placement N/A 02/07/2015    eicardial LV lead placed by Dr Laverta Baltimore   Family History  Problem Relation Age of Onset  . Heart attack Mother     in 36's  . Heart disease Mother     ischemic  . Heart attack Father     in 58's  . Heart disease Father     ischemic  . Congestive Heart Failure Mother   . Diabetes Mother   . Hypertension Mother   . Heart Problems Father   . Crohn's disease Sister   . Prostate cancer Brother   . HIV/AIDS Brother    Social History  Substance Use Topics  . Smoking status: Former Smoker -- 0.12 packs/day for 25 years    Types: Cigarettes    Quit date: 07/31/2014  . Smokeless tobacco: Never Used  . Alcohol Use: No    Review of Systems  All other systems reviewed and are negative.     Allergies  Review of patient's allergies indicates no known allergies.  Home Medications   Prior to Admission medications   Medication Sig Start Date End Date Taking? Authorizing Provider  aspirin EC 81 MG tablet Take 1 tablet (81 mg total) by mouth daily. 08/29/12   Liliane Shi, PA-C  carvedilol (COREG) 25  MG tablet Take 1 tablet (25 mg total) by mouth 2 (two) times daily with a meal. 09/19/15   Jolaine Artist, MD  furosemide (LASIX) 20 MG tablet Take 1 tablet (20 mg total) by mouth daily. 12/27/14   Jolaine Artist, MD  omeprazole (PRILOSEC) 20 MG capsule Take 1 capsule (20 mg total) by mouth daily. 09/19/15   Shaune Pascal Bensimhon, MD  sacubitril-valsartan (ENTRESTO) 97-103 MG Take 1 tablet by mouth 2 (two) times daily. 03/19/15   Jolaine Artist, MD  spironolactone (ALDACTONE) 25 MG tablet TAKE 1 TABLET (25 MG TOTAL) BY MOUTH DAILY. 09/06/15   Shaune Pascal Bensimhon, MD   BP 173/102 mmHg  Pulse 75  Temp(Src) 98.2 F (36.8 C) (Oral)  Resp 18  Ht 5\' 8"  (1.727 m)  Wt 238 lb (107.956 kg)  BMI 36.20 kg/m2   SpO2 95% Physical Exam  Constitutional: He is oriented to person, place, and time. He appears well-developed and well-nourished. No distress.  HENT:  Head: Normocephalic and atraumatic.  Mouth/Throat: Oropharynx is clear and moist. No oropharyngeal exudate.  Eyes: Conjunctivae and EOM are normal. Pupils are equal, round, and reactive to light. Right eye exhibits no discharge. Left eye exhibits no discharge. No scleral icterus.  Neck: Normal range of motion. Neck supple.  Cardiovascular: Normal rate, regular rhythm, normal heart sounds and intact distal pulses.  Exam reveals no gallop and no friction rub.   No murmur heard. Pulmonary/Chest: Effort normal and breath sounds normal. No respiratory distress. He has no wheezes. He has no rales. He exhibits no tenderness.  Abdominal: Soft. Bowel sounds are normal. He exhibits no distension and no mass. There is no tenderness. There is no rebound and no guarding.  Musculoskeletal: Normal range of motion. He exhibits no edema.  Deep gaping laceration to flexor surface of left wrist. Suspect median nerve injury as second and third digits are completely numb. Possible tendon injury as second and third digits are also very weak. Able to flex and extend other digits. Also possible arterial injury, when evaluating wound blood was forcefully pulsing out of it  Additional 1 cm laceration to palmar aspect of right hand. Superficial.  Lymphadenopathy:    He has no cervical adenopathy.  Neurological: He is alert and oriented to person, place, and time. No cranial nerve deficit.  Skin: Skin is warm and dry. No rash noted. He is not diaphoretic. No erythema. No pallor.  Psychiatric: He has a normal mood and affect. His behavior is normal.  Nursing note and vitals reviewed.   ED Course  Procedures (including critical care time) Labs Review Labs Reviewed - No data to display  Imaging Review Dg Wrist Complete Left  09/20/2015  CLINICAL DATA:  Laceration  volar aspect of left wrist from glass vase. EXAM: LEFT WRIST - COMPLETE 3+ VIEW COMPARISON:  None. FINDINGS: Bandage overlies the lower aspect of the wrist. Bony structures and joint spaces are within normal. There is no fracture or dislocation. Linear faint 2.5 mm density over the ulnar soft tissues of the wrist overlying the bandage as this may be related to the bandage although cannot exclude an underlying subtle foreign body within the soft tissue. IMPRESSION: No fracture dislocation. Bandage overlies the wrist. Subtle linear 2.5 mm density over the ulnar aspect of the wrist which may be related to the overlying bandage although cannot exclude an underlying small foreign body. Electronically Signed   By: Marin Olp M.D.   On: 09/20/2015 12:52   I have  personally reviewed and evaluated these images and lab results as part of my medical decision-making.   EKG Interpretation None      MDM   Final diagnoses:  Laceration    Patient presents with deep gaping laceration to flexor aspect of left wrist after slicing it on a broken vase by accident. Possible tendon, nerve and arterial involvement. Second and third digits of left hand have no sensation and are weak, strength to out of 5. Pulsatile expulsion of blood from area upon examination suggesting arterial involvement. Tetanus shot given 1 g Ancef given consult to hand surgery made.  Spoke with Dr. Lenon Curt with hand surgery who will take patient to the operating room today.  X-ray performed that revealed no acute fracture however possible foreign body.  Patient taken to the operating room.  Patient was discussed with and seen by Dr. Zenia Resides who agrees with the treatment plan.      Dondra Spry [place], PA-C 09/20/15 1922

## 2015-09-20 NOTE — H&P (Signed)
Reason for Consult:bilateral wrist lacerations Referring Physician: ER  CC:A vase broke on me  HPI:  Roger Crosby is an 54 y.o. right handed male who presents with   Lacerations to bilateral wrists.  Pt was working with a glass vase at work when vase broke lacerating both, hands/wrists.  Pt c/o pain, bleeding, altered sensation of R fingers    .   Pain is rated at   8 /10 and is described as sharp.  Pain is constant.  Pain is made better by rest/immobilization, worse with motion.   Associated signs/symptoms:numbness of right median nerve distribution fingers Previous treatment:  Previous ? [facility] [facility]  Past Medical History  Diagnosis Date  . HTN (hypertension)   . GERD (gastroesophageal reflux disease)   . LBP (low back pain)   . LBBB (left bundle branch block)   . Hx of echocardiogram     echo 5/11: EF 45-50%, mild HK of Ap AS wall, ap Ant wall and true apex (LAD distribution), mild LAE  . NICM (nonischemic cardiomyopathy) ([place])   . CAD (coronary artery disease)     nonobstructive by [facility] 6/11: oOM1 40%, mild luminal irregs elsewhere  . Systolic heart failure ([place])   . AICD (automatic cardioverter/defibrillator) present     a.  Unsuccessful LV lead placement 11/2014  . High cholesterol   . CKD (chronic kidney disease)     Past Surgical History  Procedure Laterality Date  . Left heart catheterization with coronary angiogram N/A 08/04/2013    Procedure: LEFT HEART CATHETERIZATION WITH CORONARY ANGIOGRAM;  Surgeon: Larey Dresser, MD;  Location: [facility] CATH LAB;  Service: Cardiovascular;  Laterality: N/A;  . Cardiac catheterization Left 07/2013    with angiogram  . Bi-ventricular implantable cardioverter defibrillator N/A 12/14/2014    with failed LV lead placeemnt  . Thoracotomy Left 02/07/2015    Procedure: THORACOTOMY MAJOR;  Surgeon: Gaye Pollack, MD;  Location: [facility] OR;  Service: Thoracic;  Laterality: Left;  . Epicardial pacing lead placement N/A 02/07/2015    eicardial LV lead placed  by Dr Laverta Baltimore    Family History  Problem Relation Age of Onset  . Heart attack Mother     in 12's  . Heart disease Mother     ischemic  . Heart attack Father     in 39's  . Heart disease Father     ischemic  . Congestive Heart Failure Mother   . Diabetes Mother   . Hypertension Mother   . Heart Problems Father   . Crohn's disease Sister   . Prostate cancer Brother   . HIV/AIDS Brother     Social History:  reports that he quit smoking about 13 months ago. His smoking use included Cigarettes. He has a 3 pack-year smoking history. He has never used smokeless tobacco. He reports that he does not drink alcohol or use illicit drugs.  Allergies: No Known Allergies  Medications: I have reviewed the patient's current medications.  Results for orders placed or performed during the hospital encounter of 09/20/15 (from the past 48 hour(s))  Basic metabolic panel     Status: Abnormal   Collection Time: 09/20/15 12:04 PM  Result Value Ref Range   Sodium 136 135 - 145 mmol/L   Potassium 4.3 3.5 - 5.1 mmol/L   Chloride 106 101 - 111 mmol/L   CO2 24 22 - 32 mmol/L   Glucose, Bld 101 (H) 65 - 99 mg/dL   BUN 15 6 - 20 mg/dL  Creatinine, Ser 1.45 (H) 0.61 - 1.24 mg/dL   Calcium 8.8 (L) 8.9 - 10.3 mg/dL   GFR calc non Af Amer 53 (L) >60 mL/min   GFR calc Af Amer >60 >60 mL/min    Comment: (NOTE) The eGFR has been calculated using the CKD EPI equation. This calculation has not been validated in all clinical situations. eGFR's persistently <60 mL/min signify possible Chronic Kidney Disease.    Anion gap 6 5 - 15  CBC with Differential     Status: None   Collection Time: 09/20/15 12:04 PM  Result Value Ref Range   WBC 7.8 4.0 - 10.5 K/uL   RBC 5.15 4.22 - 5.81 MIL/uL   Hemoglobin 13.6 13.0 - 17.0 g/dL   HCT 42.5 39.0 - 52.0 %   MCV 82.5 78.0 - 100.0 fL   MCH 26.4 26.0 - 34.0 pg   MCHC 32.0 30.0 - 36.0 g/dL   RDW 13.7 11.5 - 15.5 %   Platelets 280 150 - 400 K/uL   Neutrophils  Relative % 65 %   Neutro Abs 5.0 1.7 - 7.7 K/uL   Lymphocytes Relative 25 %   Lymphs Abs 2.0 0.7 - 4.0 K/uL   Monocytes Relative 9 %   Monocytes Absolute 0.7 0.1 - 1.0 K/uL   Eosinophils Relative 1 %   Eosinophils Absolute 0.1 0.0 - 0.7 K/uL   Basophils Relative 0 %   Basophils Absolute 0.0 0.0 - 0.1 K/uL    Dg Wrist Complete Left  09/20/2015  CLINICAL DATA:  Laceration volar aspect of left wrist from glass vase. EXAM: LEFT WRIST - COMPLETE 3+ VIEW COMPARISON:  None. FINDINGS: Bandage overlies the lower aspect of the wrist. Bony structures and joint spaces are within normal. There is no fracture or dislocation. Linear faint 2.5 mm density over the ulnar soft tissues of the wrist overlying the bandage as this may be related to the bandage although cannot exclude an underlying subtle foreign body within the soft tissue. IMPRESSION: No fracture dislocation. Bandage overlies the wrist. Subtle linear 2.5 mm density over the ulnar aspect of the wrist which may be related to the overlying bandage although cannot exclude an underlying small foreign body. Electronically Signed   By: Marin Olp M.D.   On: 09/20/2015 12:52    Pertinent items are noted in HPI. Temp:  [98.2 F (36.8 C)] 98.2 F (36.8 C) (10/21 1033) Pulse Rate:  [70-77] 74 (10/21 1307) Resp:  [18] 18 (10/21 1242) BP: (134-173)/(80-102) 156/87 mmHg (10/21 1242) SpO2:  [95 %-98 %] 97 % (10/21 1242) Weight:  [107.956 kg (238 lb)-108.183 kg (238 lb 8 oz)] 107.956 kg (238 lb) (10/21 1033) General appearance: alert and cooperative Resp: clear to auscultation bilaterally Cardio: regular rate and rhythm GI: soft, non-tender; bowel sounds normal; no masses,  no organomegaly Extremities: Left wrist with larger volar laceration, + active bleeding, decreased sensation to THUMB, IF, LF, weak flexion of  IF, LF, RF; laceration of R hand, flexor tendon function appears wnl, n/v status wnl   Assessment: Lacerations bilateral hands, suspect  tendon nerve laceration L; ? Tendon nerve laceration R Plan: Will explore both wounds, repair structures as needed. I have discussed this treatment plan in detail with patient , including the risks of the recommended treatment and surgery, the benefits and the alternatives.  The patient  understands that additional treatment may be necessary.  Kindle Strohmeier CHRISTOPHER 09/20/2015, 1:23 PM

## 2015-09-20 NOTE — Anesthesia Postprocedure Evaluation (Signed)
Anesthesia Post Note  Patient: Roger Crosby  Procedure(s) Performed: Procedure(s) (LRB): repair nerve tendon wrist (Left) DEBRIDEMENT AND CLOSURE WOUND (Right)  Anesthesia type: regional  Patient location: PACU  Post pain: Pain level controlled  Post assessment: Patient's Cardiovascular Status Stable  Last Vitals:  Filed Vitals:   09/20/15 1600  BP: 119/85  Pulse: 72  Temp: 36.4 C  Resp: 18    Post vital signs: Reviewed and stable  Level of consciousness: awake  Complications: No apparent anesthesia complications

## 2015-09-20 NOTE — ED Notes (Signed)
Dr. Zenia Resides and Sam PA at bedside.

## 2015-09-20 NOTE — Progress Notes (Signed)
Orthopedic Tech Progress Note Patient Details:  Roger Crosby 01/09/1961 [id number]  Ortho Devices Type of Ortho Device: Arm sling Ortho Device/Splint Location: LUE Ortho Device/Splint Interventions: Ordered, Application   Braulio Bosch 09/20/2015, 4:05 PM

## 2015-09-20 NOTE — Anesthesia Preprocedure Evaluation (Addendum)
Anesthesia Evaluation  Patient identified by MRN, date of birth, ID band Patient awake    Reviewed: Allergy & Precautions, NPO status , Patient's Chart, lab work & pertinent test results, reviewed documented beta blocker date and time   Airway Mallampati: I  TM Distance: >3 FB Neck ROM: Full    Dental  (+) Dental Advisory Given   Pulmonary former smoker,    Pulmonary exam normal        Cardiovascular hypertension, Pt. on medications and Pt. on home beta blockers + CAD and +CHF  Normal cardiovascular exam+ pacemaker + Cardiac Defibrillator   Echo 09/2014 - Left ventricle: The cavity size was moderately dilated. Systolic function was severely reduced. The estimated ejection fractionwas in the range of 25% to 30%. Diffuse hypokinesis. There is akinesis of the anteroseptal and inferoseptal myocardium. Doppler parameters are consistent with abnormal left ventricular relaxation (grade 1 diastolic dysfunction). - Mitral valve: There was mild regurgitation. - Left atrium: The atrium was moderately dilated.   Neuro/Psych    GI/Hepatic GERD  Medicated,  Endo/Other    Renal/GU Renal InsufficiencyRenal disease     Musculoskeletal   Abdominal   Peds  Hematology   Anesthesia Other Findings   Reproductive/Obstetrics                          Anesthesia Physical Anesthesia Plan  ASA: III  Anesthesia Plan: Regional   Post-op Pain Management:    Induction: Intravenous  Airway Management Planned: Natural Airway  Additional Equipment:   Intra-op Plan:   Post-operative Plan:   Informed Consent: I have reviewed the patients History and Physical, chart, labs and discussed the procedure including the risks, benefits and alternatives for the proposed anesthesia with the patient or authorized representative who has indicated his/her understanding and acceptance.   Dental advisory given  Plan  Discussed with: Surgeon and CRNA  Anesthesia Plan Comments:        Anesthesia Quick Evaluation

## 2015-09-20 NOTE — Discharge Instructions (Signed)
Discharge Instructions:  Keep your dressing clean, dry and in place until instructed to remove by Dr. Malick Netz.  If the dressing becomes dirty or wet call the office for instructions during business hours. Elevate the extremity to help with swelling, this will also help with any discomfort. Take your medication as prescribed. No lifting with the injured  extremity. If you feel that the dressing is too tight, you may loosen it, but keep it on; finger tips should be pink; if there is a concern, call the office. [phone] Ice may be used if the injury is a fracture, do not apply ice directly to the skin. Please call the office on the next business day after discharge to arrange a follow up appointment.  Call [phone] between the hours of 9am - 5pm M-Th or 9am - 1pm on Fri. For most hand injuries and/or conditions, you may return to work using the uninjured hand (one handed duty) within 24-72 hours.  A detailed note will be provided to you at your follow up appointment or may contact the office prior to your follow up.    

## 2015-09-20 NOTE — ED Provider Notes (Signed)
Medical screening examination/treatment/procedure(s) were conducted as a shared visit with non-physician practitioner(s) and myself.  I personally evaluated the patient during the encounter.   EKG Interpretation None     Patient here after suffering laceration to the volar aspect of his left wrist. Has decreased sensation at the distal second and third digits of the left hand. Also has some possible arterial bleeding coming from the large laceration on the wrist. Will consult hand surgery  Lacretia Leigh, MD 09/20/15 1109

## 2015-09-20 NOTE — Telephone Encounter (Signed)
Called and left generic message for patient to return phone call to [phone] and just ask for [name]. Need to speak with patient about getting his titration that was supposed to be schedule around 03/2015 scheduled. [facility] center has patient scheduled for Thursday January 5th 2016 at 8pm.    Call Documentation      [name], RN at 03/28/2015 5:37 PM     Status: Signed       Expand All Collapse All   Informed patient of results and verbal understanding expressed.  CPAP titration ordered for scheduling. Patient agrees with treatment plan.            [name], RN at 03/26/2015 7:45 PM     Status: Signed       Expand All Collapse All   Left message to call back.            [name], MD at 03/25/2015 5:31 PM     Status: Signed       Expand All Collapse All   Please let patient know that they have sleep apnea and recommend CPAP titration. Please set up titration in the sleep lab.

## 2015-09-20 NOTE — Transfer of Care (Signed)
Immediate Anesthesia Transfer of Care Note  Patient: Roger Crosby  Procedure(s) Performed: Procedure(s): repair nerve tendon wrist (Left) DEBRIDEMENT AND CLOSURE WOUND (Right)  Patient Location: PACU  Anesthesia Type:MAC  Level of Consciousness: awake, alert  and oriented  Airway & Oxygen Therapy: Patient Spontanous Breathing and Patient connected to nasal cannula oxygen  Post-op Assessment: Report given to RN, Post -op Vital signs reviewed and stable and Patient moving all extremities X 4  Post vital signs: Reviewed and stable  Last Vitals:  Filed Vitals:   09/20/15 1307  BP:   Pulse: 74  Temp:   Resp:     Complications: No apparent anesthesia complications

## 2015-09-20 NOTE — ED Notes (Signed)
Pt arrives via [facility] from work. Pt had a vase filled with rocks and was shaking the vase trying to shake the rocks out. The vase broke and cut the pt on his right hand and to left wrist. Pt is on blood thinners.

## 2015-09-21 NOTE — Op Note (Signed)
Roger Crosby, Roger Crosby               ACCOUNT NO.:  [id number]  MEDICAL RECORD NO.:  [id number]  LOCATION:  [facility]                         FACILITY:  [facility]  PHYSICIAN:  Dennie Bible, MD    DATE OF BIRTH:  [birth year]  DATE OF PROCEDURE:  09/20/2015 DATE OF DISCHARGE:  09/20/2015                              OPERATIVE REPORT   PREOPERATIVE DIAGNOSES: 1. Complex laceration to the left wrist with presumed flexor tendon     and median nerve injury. 2. Laceration to the right wrist hypothenar area.  POSTOPERATIVE DIAGNOSES: 1. Complex laceration to the left wrist with presumed flexor tendon     and median nerve injury. 2. Laceration to the right wrist hypothenar area.  PROCEDURES PERFORMED:  Left wrist exploration of complex wound, repair of flexor digitorum superficialis to the index finger and long fingers, repair of median nerve primary repair with overlap of an AxoGen, AxoGuard nerve protector.  Exploration of wound of the right palm hypothenar area with simple closure of the right hand wound, complex closure of the left wrist wound measuring 7 cm.  INDICATIONS:  Mr. Pressley is a 54 year old gentleman who was working, setting up some supplies for a wedding, was working with a glass vase. The glass vase broken in his hand and sustained lacerations to both the left wrist and right hand, and presented to the emergency department. There was significant amount of bleeding in the emergency department. There was loss of function of the left fingers as well as altered sensation of the left fingers and the median nerve distribution.  In the right hand, there was a stellate-type laceration over the hypothenar eminence that was actively bleeding.  On evaluation, he indeed had evidence of median nerve laceration.  On the left hand, he had evidence of some weak flexion of his index and long fingers consistent with flexor tendon injury.  On the right, there was an obvious deep puncture- type  wound over the hypothenar eminence; however, there was no hard evidence of flexor tendon or nerve injury of the right hand.  Risks, benefits and alternative exploration of these wounds and repair of structures were discussed with the patient.  He agreed with the risks, agreed with surgery and consent was obtained.  DESCRIPTION OF PROCEDURE:  The patient was taken to the operating room and placed supine on the operating room table.  Due to his cardiac history, a supraclavicular block was performed by the Anesthesia Service and IV sedation was performed for surgery.  Beginning on the left, the left upper extremity was prepped and draped in a normal sterile fashion. Tourniquet was used on the upper arm.  The arm was exsanguinated and the tourniquet was inflated to 250 mmHg.  The laceration on the left wrist was approximately 7 cm long, it was somewhat stellate, longitudinal and jagged.  The edges were debrided.  Thorough irrigation of the wound was then performed.  It was evident that the wound was deep.  The median nerve was in the wound, it was completely transected.  There was evidence of a flexor tendon laceration as well.  After adequate irrigation of the wound, the wound was explored, there was no glass  foreign bodies that were encountered.  The flexor tendon sheath was explored.  There was laceration to the FDS of the index finger and FDS to the long finger.  The FDS to the ring and small fingers were intact as well as the FDP tendons.  The ulnar side of the wrist, the ulnar neurovascular bundle and FCU tendon were intact.  Following each tendon both the FDS to the index and FDS to long were individually repaired with 4-0 FiberWire sutures, nicely approximating each tendon end.  Next, the median nerve was isolated and freed up proximally, so a non-tension repair could be performed.  An epineural repair was performed with 8-0 nylon for total of five interrupted sutures.  Next, an  AxoGen, AxoGuard nerve protector was placed posterior to the nerve and wrapped anteriorly.  Afterwards, the skin edges were fashioned and complex wound was closed with multiple interrupted and mattress 4-0 nylon sutures. Sterile dressing was applied and the patient was placed in a dorsal blocking splint with the wrist slightly flexed.  The tourniquet was released.  All fingertips were returned to nice pink color.  Next, direction was taken to the right hand.  Right hand was re-prepped and draped in a sterile fashion.  The wound again was over the hypothenar eminence, small amount of hematoma was evacuated from the wound.  The wound was thoroughly irrigated.  There was no active bleeding.  Probing of the wound and movement of the long and ring fingers did not reveal any significant altered tension or evidence of flexor tendon injury. The wound was thoroughly irrigated and then repaired with several interrupted 4-0 nylon sutures.  A sterile dressing was applied.  The patient tolerated the procedure well, was taken to the recovery room in stable condition.     Dennie Bible, MD     HCC/MEDQ  D:  09/20/2015  T:  09/21/2015  Job:  [id number]

## 2015-09-23 ENCOUNTER — Encounter (HOSPITAL_COMMUNITY): Payer: Self-pay | Admitting: General Surgery

## 2015-09-28 ENCOUNTER — Encounter (HOSPITAL_COMMUNITY): Payer: Self-pay | Admitting: Family Medicine

## 2015-09-28 ENCOUNTER — Emergency Department (HOSPITAL_COMMUNITY): Payer: Medicaid Other

## 2015-09-28 ENCOUNTER — Emergency Department (HOSPITAL_COMMUNITY)
Admission: EM | Admit: 2015-09-28 | Discharge: 2015-09-28 | Disposition: A | Discharge status: Home / Self Care | Payer: Medicaid Other | Attending: Emergency Medicine | Admitting: Emergency Medicine

## 2015-09-28 DIAGNOSIS — F1110 Opioid abuse, uncomplicated: Secondary | ICD-10-CM | POA: Insufficient documentation

## 2015-09-28 DIAGNOSIS — I5020 Unspecified systolic (congestive) heart failure: Secondary | ICD-10-CM | POA: Diagnosis not present

## 2015-09-28 DIAGNOSIS — N189 Chronic kidney disease, unspecified: Secondary | ICD-10-CM | POA: Insufficient documentation

## 2015-09-28 DIAGNOSIS — Z87891 Personal history of nicotine dependence: Secondary | ICD-10-CM | POA: Insufficient documentation

## 2015-09-28 DIAGNOSIS — I2510 Atherosclerotic heart disease of native coronary artery without angina pectoris: Secondary | ICD-10-CM | POA: Insufficient documentation

## 2015-09-28 DIAGNOSIS — Z79899 Other long term (current) drug therapy: Secondary | ICD-10-CM | POA: Insufficient documentation

## 2015-09-28 DIAGNOSIS — I129 Hypertensive chronic kidney disease with stage 1 through stage 4 chronic kidney disease, or unspecified chronic kidney disease: Secondary | ICD-10-CM | POA: Insufficient documentation

## 2015-09-28 DIAGNOSIS — F419 Anxiety disorder, unspecified: Secondary | ICD-10-CM | POA: Insufficient documentation

## 2015-09-28 DIAGNOSIS — Z8639 Personal history of other endocrine, nutritional and metabolic disease: Secondary | ICD-10-CM | POA: Insufficient documentation

## 2015-09-28 DIAGNOSIS — Z7982 Long term (current) use of aspirin: Secondary | ICD-10-CM | POA: Diagnosis not present

## 2015-09-28 DIAGNOSIS — K219 Gastro-esophageal reflux disease without esophagitis: Secondary | ICD-10-CM | POA: Diagnosis not present

## 2015-09-28 DIAGNOSIS — E876 Hypokalemia: Secondary | ICD-10-CM | POA: Diagnosis not present

## 2015-09-28 DIAGNOSIS — F1410 Cocaine abuse, uncomplicated: Secondary | ICD-10-CM | POA: Insufficient documentation

## 2015-09-28 DIAGNOSIS — Z792 Long term (current) use of antibiotics: Secondary | ICD-10-CM | POA: Diagnosis not present

## 2015-09-28 DIAGNOSIS — F1910 Other psychoactive substance abuse, uncomplicated: Secondary | ICD-10-CM

## 2015-09-28 LAB — RAPID URINE DRUG SCREEN, HOSP PERFORMED
AMPHETAMINES: NOT DETECTED
BARBITURATES: NOT DETECTED
Benzodiazepines: NOT DETECTED
COCAINE: POSITIVE — AB
Opiates: POSITIVE — AB
TETRAHYDROCANNABINOL: NOT DETECTED

## 2015-09-28 LAB — CBC
HEMATOCRIT: 45.4 % (ref 39.0–52.0)
HEMOGLOBIN: 15.2 g/dL (ref 13.0–17.0)
MCH: 27.2 pg (ref 26.0–34.0)
MCHC: 33.5 g/dL (ref 30.0–36.0)
MCV: 81.2 fL (ref 78.0–100.0)
Platelets: 360 10*3/uL (ref 150–400)
RBC: 5.59 MIL/uL (ref 4.22–5.81)
RDW: 13.6 % (ref 11.5–15.5)
WBC: 12.6 10*3/uL — ABNORMAL HIGH (ref 4.0–10.5)

## 2015-09-28 LAB — COMPREHENSIVE METABOLIC PANEL
ALBUMIN: 3.9 g/dL (ref 3.5–5.0)
ALT: 17 U/L (ref 17–63)
AST: 19 U/L (ref 15–41)
Alkaline Phosphatase: 73 U/L (ref 38–126)
Anion gap: 13 (ref 5–15)
BUN: 11 mg/dL (ref 6–20)
CHLORIDE: 105 mmol/L (ref 101–111)
CO2: 23 mmol/L (ref 22–32)
Calcium: 9.7 mg/dL (ref 8.9–10.3)
Creatinine, Ser: 1.39 mg/dL — ABNORMAL HIGH (ref 0.61–1.24)
GFR calc Af Amer: >60 mL/min (ref >60)
GFR, EST NON AFRICAN AMERICAN: 56 mL/min — AB (ref >60)
GLUCOSE: 118 mg/dL — AB (ref 65–99)
POTASSIUM: 3.1 mmol/L — AB (ref 3.5–5.1)
Sodium: 141 mmol/L (ref 135–145)
Total Bilirubin: 0.7 mg/dL (ref 0.3–1.2)
Total Protein: 7.6 g/dL (ref 6.5–8.1)

## 2015-09-28 LAB — I-STAT TROPONIN, ED
TROPONIN I, POC: 0.01 ng/mL (ref 0.00–0.08)
Troponin i, poc: 0.02 ng/mL (ref 0.00–0.08)

## 2015-09-28 LAB — ETHANOL

## 2015-09-28 MED ORDER — IBUPROFEN 800 MG PO TABS
800.0000 mg | ORAL_TABLET | Freq: Once | ORAL | Status: AC
  Administered 2015-09-28: 800 mg via ORAL
  Filled 2015-09-28 (×2): qty 1

## 2015-09-28 MED ORDER — POTASSIUM CHLORIDE CRYS ER 20 MEQ PO TBCR
10.0000 meq | EXTENDED_RELEASE_TABLET | Freq: Once | ORAL | Status: AC
  Administered 2015-09-28: 10 meq via ORAL
  Filled 2015-09-28: qty 1

## 2015-09-28 MED ORDER — DIAZEPAM 5 MG PO TABS
5.0000 mg | ORAL_TABLET | Freq: Once | ORAL | Status: AC
  Administered 2015-09-28: 5 mg via ORAL
  Filled 2015-09-28: qty 1

## 2015-09-28 MED ORDER — SODIUM CHLORIDE 0.9 % IV BOLUS (SEPSIS)
500.0000 mL | Freq: Once | INTRAVENOUS | Status: AC
  Administered 2015-09-28: 500 mL via INTRAVENOUS

## 2015-09-28 NOTE — ED Notes (Signed)
Pt able to dress independently (minus assistance with his belt due to his hand being in a cast) and pt provided with ace bandage to wrap exposed areas of cast.  Pt able to ambulate, gait steady and even.  Pt verbalized understanding of all discharge instructions and verbalized game plan for following up with resources for detox.  Signature pad in room not working.  All questions answered.

## 2015-09-28 NOTE — Discharge Instructions (Signed)
Follow up with primary care physician within 3 days to discuss your health.  The resources you have requested are attached.   Behavioral Health Resources in the [facility]  Intensive Outpatient Programs: [facility]      [place]. [place], [place] Both a day and evening program       [facility] Outpatient     [address]        [place], [place] [address] [phone]         ADS: Alcohol & Drug Svcs [place]: [phone] or [phone] [address] [place], [place] [address] [email or web address]  Mobile Crisis Teams:                                        Therapeutic Alternatives         Mobile Crisis Care Unit [phone]             Assertive Psychotherapeutic Services [address] Lady [place] [address] [place] [phone]  Self-Help/Support Groups: Mental Health Assoc. of [employer] of support groups [phone] (call for more info)  Narcotics Anonymous (NA) Caring Services [address] [place] - 2 meetings at this location  Residential Treatment Programs:  [employer]       [place] [address] [place], [place]  [address] [place]  [address] [place], [place] [address] [phone] Admissions: 8am-3pm M-F  Incentives Substance [place]     [address] [place], [place] [address]       [phone]         [facility] [address] Jadene Pierini [place], [place]  [facility] [address] [place], [place]  Insight Programs - Intensive  Outpatient      [address] [place] [address]     [place], [place]         [facility]     [place], [place] or [phone]  Residential Treatment Services (RTS)  [place], [place]  Fellowship [address]                                               [place]  [facility] Resources: [phone]               General Therapy  Domenic Schwab, PhD        [place], [place] [address]         [facility]   [address] [place], [place] [address] [phone]  [facility] Recovery [address] [place], [place] [address] [phone] Insurance/Medicaid/sponsorship through [facility] and Families                                              [address] [place]                                        [place],  [address]    Therapy/tele-psych/case         [place] [address] [place],   [address]  Adolescent/group home/case management [phone]                                           Rosette Reveal PhD       General therapy       Insurance   [phone]         Dr. Adele Schilder Insurance 336- [place] Detox/Residential Medicaid, sponsorship [phone]   Emergency Department Resource Guide 1) Find a Doctor and Pay Out of Pocket Although you won't have to find out who is covered by your insurance plan, it is a good idea to ask around and get recommendations. You will then need to call the office and see if the doctor you have chosen will accept you as a new patient and what types of options they offer for patients who are self-pay. Some doctors offer discounts or will set up payment plans for their patients who do not  have insurance, but you will need to ask so you aren't surprised when you get to your appointment.  2) Contact Your Local Health Department Not all health departments have doctors that can see patients for sick visits, but many do, so it is worth a call to see if yours does. If you don't know where your local health department is, you can check in your phone book. The CDC also has a tool to help you locate your state's health department, and many state websites also have listings of all of their local health departments.  3) Find a [facility] If your illness is not likely to be very severe or complicated, you may want to try a walk in clinic. These are popping up all over the country in pharmacies, drugstores, and shopping centers. They're usually staffed by nurse practitioners or physician assistants that have been trained to treat common illnesses and complaints. They're usually fairly quick and inexpensive. However, if you have serious medical issues or chronic medical problems, these are probably not your best option.  No Primary Care Doctor: - Call Health Connect at  [phone] - they can  help you locate a primary care doctor that  accepts your insurance, provides certain services, etc. - Physician Referral Service- [phone]  Chronic Pain Problems: Organization         Address  Phone   Notes  [facility]  [phone] Patients need to be referred by their primary care doctor.   Medication Assistance: Organization         Address  Phone   Notes  [facility] Medication [facility] [address] [place], [place] [address] [phone] --Must be a resident of [facility] -- Must have NO insurance coverage whatsoever (no Medicaid/ Medicare, etc.) -- The pt. MUST have a primary care doctor that directs their care regularly and follows them in the community   MedAssist  [phone]   [employer]  [phone]    Agencies that  provide inexpensive medical care: Organization         Address  Phone   Notes  [place]  [phone]   [facility]    [phone]   [facility] [place], [place] [address] [phone]   [place] [address], [place] [phone]   Planned Parenthood    [phone]   [facility]    [phone]   [place] and [place] [address], [place] Phone:  [phone], Fax:  [phone] Hours of Operation:  9 am - 6 pm, M-F.  Also accepts Medicaid/Medicare and self-pay.  [facility] for [place], [address], [place] Phone: [phone], Fax: [phone]. Hours of Operation:  8:30 am - 5:30 pm, M-F.  Also accepts Medicaid and self-pay.  [facility] [place] [address], [place] Phone: [phone]   [place], [place], [place] [phone], Ext. [phone] Mondays & Thursdays: 7-9 AM.  First 15 patients are seen on a first come, first serve basis.    [place] Providers:  Organization         Address  Phone   Notes  [facility] [address], [place] [phone] Also accepts self-pay patients.  [facility] [address], [place]  [phone]   [place], [address], [place] [phone]   [facility] [address] [place] [phone]   Lucianne Lei [address], [place]   [phone] Only accepts [place] Access [place] patients after they have their name applied to their card.   Self-Pay (no insurance) in [facility]:  Organization         Address  Phone   Notes  Sickle Cell Patients, [facility] Internal Medicine [place] [phone]   [facility]  Urgent Care [place] [phone]   [facility] [place]  [place], [place], [place] [phone]   [facility]/Dr. Osei-Bonsu  [address], [place] or [address], [place] [phone] Phone number for both [place] and [place] locations is the same.  Urgent Medical and [facility] [address] [place] [phone]   [facility] [address], [place] or [place]  Dr [phone]   [facility] [place] [phone], phone; [phone], fax Sees patients 1st and 3rd Saturday of every month.  Must not qualify for public or private insurance (i.e. Medicaid, Medicare, [place] Health Choice, Veterans' Benefits)  Household income should be no more than 200% of the poverty level The clinic cannot treat you if you are pregnant or think you are pregnant  Sexually transmitted diseases are not treated at the clinic.    Dental Care: Organization         Address  Phone  Notes  [facility] Department of [facility] [place] [phone] Accepts children up to age 11 who are enrolled in [place] or [place]; pregnant women with a Medicaid card; and children who have applied for Medicaid or [place] Health Choice, but were declined, whose parents can pay a reduced fee at time of service.  [facility] Department of [facility]  [address], [place] [phone] Accepts children up to age 7 who are enrolled in [place] or [place]; pregnant women with a Medicaid card; and children who have applied for Medicaid or [place] Health Choice, but were declined, whose parents can pay a reduced fee at time of service.  [place] Adult Dental Access PROGRAM  [place] [phone] Patients are seen by appointment only. Walk-ins  are not accepted. [place] will see patients 82 years of age and older. Monday - Tuesday (8am-5pm) Most Wednesdays (8:30-5pm) $30 per visit, cash only  [facility] Adult Dental Access PROGRAM  [address], [facility] [phone] Patients are seen by appointment only. Walk-ins are not accepted. Daniel will see patients 56 years of age and older. One Wednesday Evening (Monthly: Volunteer Based).  $30 per visit, cash only  [facility]  [phone] for adults; Children under age 35, call Graduate Pediatric Dentistry at [phone]. Children aged 12-14, please call [phone] to request a pediatric application.  Dental services are provided in all areas of dental care including fillings, crowns and bridges, complete and partial dentures, implants, gum treatment, root canals, and extractions. Preventive care is also provided. Treatment is provided to both adults and children. Patients are selected via a lottery and there is often a waiting list.   [facility] [address], [place]  [phone] [email or web address]   [facility] [address] [place], [place] [phone], Ext. [phone] Second and Fourth Thursday of each month, opens at 6:30 AM; Clinic ends at 9 AM.  Patients are seen on a first-come first-served basis, and a limited number are seen during each clinic.   [facility]  [address] Hillard Danker [place], [place] [phone]   Eligibility Requirements You must have lived in [place], [place], or [place] counties for at least the last three months.   You cannot be eligible for state or federal sponsored [employer], including [employer], [place], or [employer].   You generally cannot be eligible for healthcare insurance through your employer.    How to apply: Eligibility screenings are held every Tuesday and Wednesday afternoon from 1:00 pm until 4:00 pm. You do not need an appointment  for the interview!  [facility] [address] [place], [place]   [place]  [place] Department  [place] in the Community: Intensive Outpatient Programs Organization         Address  Phone  Notes  [place]. [address], [place], [place] [phone]   [facility] Outpatient [address] [place], [place]   ADS: Alcohol & Drug Svcs [address], [place], [place]   [place] [address]  [place], [place] or [phone]   Substance Abuse Resources Organization         Address  Phone  Notes  Alcohol and Drug Services  [phone]   [place]  [phone]   The [place]   Chinita Pester  9471802068   Residential & Outpatient Substance Abuse Program  [phone]   Psychological Services Organization         Address  Phone  Notes  [facility]  [place]  [phone]   [place] [address], [place] or [phone]    Mobile Crisis Teams Organization         Address  Phone  Notes  Therapeutic Alternatives, Mobile Crisis Care Unit  [phone]   Assertive Psychotherapeutic Services  [address] [place], [place]   [place] [address], [place] [phone]    Self-Help/Support Groups Organization         Address  Phone             Notes  [place]. of [place] - variety of support groups  [place] Call for more information  Narcotics Anonymous (NA), Caring Services [address], [employer] [place]  2 meetings at this location   Special educational needs teacher         Address  Phone  Notes  ASAP Residential  Treatment [place],    [place]  [phone]   [facility]  [address], [place] [address], [place], [place]   [place], [place] [phone] Admissions: 8am-3pm M-F  Incentives Substance [place] [address]    [place], [place] [phone]   [facility] [address] [place], [place], [place]   [facility] [address]  [place], [place]   Insight Programs - Intensive Outpatient [address] Kristeen Mans [address], [place], [place]   [facility] [place].,  [place], [place] [phone] or [phone]   Residential Treatment Services (RTS) [address] [place], [place] Accepts Medicaid  Fellowship [place] [address]  [place] [phone] Substance Abuse/Addiction Treatment   [facility] Organization         Address  Phone  Notes  [facility]  9087814677   Domenic Schwab, PhD [address] Arlis Porta [place], [place]   [phone] or [phone]   [place], [place] [phone]   [place] [address], [place], [place] [phone] Insurance/Medicaid/sponsorship through [employer] and Families [address], [place]                                    [place], [place] (  [phone] [place], [place] [phone]    Dr. Adele Schilder  [phone]   [facility] [place] Dept. 1) [address] [place] 2) [place] 3)  [place] [address], [place] [phone]  [phone]   [place] [phone] or [phone] (After Hours)

## 2015-09-28 NOTE — ED Notes (Signed)
Pt here for detox from crack cocaine. sts last use PTA. Denies SI.

## 2015-09-28 NOTE — ED Provider Notes (Signed)
CSN: [id number]     Arrival date & time 09/28/15  1517 History   First MD Initiated Contact with Patient 09/28/15 1607     Chief Complaint  Patient presents with  . Drug Problem     (Consider location/radiation/quality/duration/timing/severity/associated sxs/prior Treatment) Patient is a 54 y.o. male presenting with drug problem. The history is provided by the patient and medical records. No language interpreter was used.  Drug Problem Pertinent negatives include no abdominal pain, congestion, coughing, headaches, nausea, neck pain, rash, sore throat, vomiting or weakness.  Roger Crosby is a 54 y.o. male  with a PMH of HTN, CAD who presents to the Emergency Department for help with cocaine detox. Patient states he had approximately 0.5 ounces of cocaine one hour prior to arrival. He admits that he has gotten clean in the past, but when he gets upset, he has trouble resisting the urge to use cocaine. Today, he had a fight with his wife which led to this episode.This has been an off-and-on problem for many years now, and he states he would now like to get help.  Patient denies chest pain, diaphoresis. Only complaint at this time is his hand hurting - he was here for lac repair on 10/21.  Prior to entering the room, patient urinated on self. He states this only occurs when he is using drugs.  Past Medical History  Diagnosis Date  . HTN (hypertension)   . GERD (gastroesophageal reflux disease)   . LBP (low back pain)   . LBBB (left bundle branch block)   . Hx of echocardiogram     echo 5/11: EF 45-50%, mild HK of Ap AS wall, ap Ant wall and true apex (LAD distribution), mild LAE  . NICM (nonischemic cardiomyopathy) ([place])   . CAD (coronary artery disease)     nonobstructive by [facility] 6/11: oOM1 40%, mild luminal irregs elsewhere  . Systolic heart failure ([place])   . AICD (automatic cardioverter/defibrillator) present     a.  Unsuccessful LV lead placement 11/2014  . High cholesterol   . CKD  (chronic kidney disease)    Past Surgical History  Procedure Laterality Date  . Left heart catheterization with coronary angiogram N/A 08/04/2013    Procedure: LEFT HEART CATHETERIZATION WITH CORONARY ANGIOGRAM;  Surgeon: Larey Dresser, MD;  Location: [facility] CATH LAB;  Service: Cardiovascular;  Laterality: N/A;  . Cardiac catheterization Left 07/2013    with angiogram  . Bi-ventricular implantable cardioverter defibrillator N/A 12/14/2014    with failed LV lead placeemnt  . Thoracotomy Left 02/07/2015    Procedure: THORACOTOMY MAJOR;  Surgeon: Gaye Pollack, MD;  Location[facility] OR;  Service: Thoracic;  Laterality: Left;  . Epicardial pacing lead placement N/A 02/07/2015    eicardial LV lead placed by Dr Laverta Baltimore  . Tendon repair Left 09/20/2015    Procedure: repair nerve tendon wrist;  Surgeon: Dayna Barker, MD;  Location: [place];  Service: Plastics;  Laterality: Left;  . Debridement and closure wound Right 09/20/2015    Procedure: DEBRIDEMENT AND CLOSURE WOUND;  Surgeon: Dayna Barker, MD;  Location: [place];  Service: Plastics;  Laterality: Right;   Family History  Problem Relation Age of Onset  . Heart attack Mother     in 40's  . Heart disease Mother     ischemic  . Heart attack Father     in 44's  . Heart disease Father     ischemic  . Congestive Heart Failure Mother   . Diabetes Mother   .  Hypertension Mother   . Heart Problems Father   . Crohn's disease Sister   . Prostate cancer Brother   . HIV/AIDS Brother    Social History  Substance Use Topics  . Smoking status: Former Smoker -- 0.12 packs/day for 25 years    Types: Cigarettes    Quit date: 07/31/2014  . Smokeless tobacco: Never Used  . Alcohol Use: No    Review of Systems  Constitutional: Negative.   HENT: Negative for congestion, hearing loss, rhinorrhea and sore throat.   Eyes: Negative for visual disturbance.  Respiratory: Negative for cough, shortness of breath and wheezing.   Cardiovascular: Negative.    Gastrointestinal: Negative for nausea, vomiting, abdominal pain, diarrhea and constipation.  Endocrine: Negative for polydipsia and polyuria.  Musculoskeletal: Negative for back pain and neck pain.       Pain of left hand.   Skin: Negative for rash.  Neurological: Negative for dizziness, weakness and headaches.  Psychiatric/Behavioral: Negative for hallucinations, behavioral problems and agitation. The patient is nervous/anxious.        Denies suicidal/homicidal ideations.      Allergies  Review of patient's allergies indicates no known allergies.  Home Medications   Prior to Admission medications   Medication Sig Start Date End Date Taking? Authorizing Provider  aspirin EC 81 MG tablet Take 1 tablet (81 mg total) by mouth daily. 08/29/12  Yes Scott T Kathlen Mody, PA-C  carvedilol (COREG) 25 MG tablet Take 1 tablet (25 mg total) by mouth 2 (two) times daily with a meal. 09/19/15  Yes Jolaine Artist, MD  cephALEXin (KEFLEX) 500 MG capsule Take 500 mg by mouth 4 (four) times daily. 09/20/15  Yes Historical Provider, MD  furosemide (LASIX) 20 MG tablet Take 1 tablet (20 mg total) by mouth daily. 12/27/14  Yes Jolaine Artist, MD  HYDROcodone-acetaminophen (NORCO) 7.5-325 MG tablet Take 1 tablet by mouth daily as needed. 09/20/15  Yes Historical Provider, MD  omeprazole (PRILOSEC) 20 MG capsule Take 1 capsule (20 mg total) by mouth daily. 09/19/15  Yes Shaune Pascal Bensimhon, MD  sacubitril-valsartan (ENTRESTO) 97-103 MG Take 1 tablet by mouth 2 (two) times daily. 03/19/15  Yes Jolaine Artist, MD  spironolactone (ALDACTONE) 25 MG tablet TAKE 1 TABLET (25 MG TOTAL) BY MOUTH DAILY. 09/06/15  Yes Shaune Pascal Bensimhon, MD   BP 125/67 mmHg  Pulse 101  Temp(Src) 98.2 F (36.8 C)  Resp 26  SpO2 97% Physical Exam  Constitutional: He is oriented to person, place, and time.  WDWN AAM who appears anxious but in NAD  HENT:  Head: Normocephalic and atraumatic.  Mouth/Throat: Oropharynx is clear  and moist. No oropharyngeal exudate.  Eyes: Conjunctivae and EOM are normal. Pupils are equal, round, and reactive to light. Right eye exhibits no discharge. Left eye exhibits no discharge. No scleral icterus.  Neck: Normal range of motion. Neck supple.  Cardiovascular: Intact distal pulses.   Tachy but regular; no murmurs, rubs, or gallops  Pulmonary/Chest: Effort normal and breath sounds normal. No respiratory distress. He has no wheezes. He has no rales. He exhibits no tenderness.  Abdominal: Soft. Bowel sounds are normal. He exhibits no distension. There is no tenderness. There is no rebound and no guarding.  Musculoskeletal: He exhibits no edema.  Two sutures to right palmar lac. Wound appears to be healing well; clean and dry with no erythema.  Left hand wrapped in splint- surgery for laceration when here 10/21  Neurological: He is alert and oriented to person,  place, and time. No cranial nerve deficit.  Psychiatric: He has a normal mood and affect. His behavior is normal. Judgment and thought content normal.  Anxious, tearing up when telling story, but no suicidal or homicidal ideations; appears to be thinking clearly and behaving normally for the situation at hand.   Nursing note and vitals reviewed.   ED Course  Procedures (including critical care time) Labs Review Labs Reviewed  COMPREHENSIVE METABOLIC PANEL - Abnormal; Notable for the following:    Potassium 3.1 (*)    Glucose, Bld 118 (*)    Creatinine, Ser 1.39 (*)    GFR calc non Af Amer 56 (*)    All other components within normal limits  CBC - Abnormal; Notable for the following:    WBC 12.6 (*)    All other components within normal limits  URINE RAPID DRUG SCREEN, HOSP PERFORMED - Abnormal; Notable for the following:    Opiates POSITIVE (*)    Cocaine POSITIVE (*)    All other components within normal limits  ETHANOL  I-STAT TROPOININ, ED  Roger Crosby, ED    Imaging Review Dg Chest 2 View  09/28/2015   CLINICAL DATA:  Detox, currently shortness of breath. EXAM: CHEST  2 VIEW COMPARISON:  Chest x-ray dated 04/03/2015. FINDINGS: Cardiomediastinal silhouette is stable in size and configuration. Heart size is upper normal. Left chest wall pacemaker/ AICD is stable in position. Lungs are clear. Lung volumes are normal. No pleural effusion. No pneumothorax. No acute osseous abnormality. Mild degenerative change again noted within the thoracic spine. IMPRESSION: Stable chest x-ray. No evidence of acute cardiopulmonary abnormality. Lungs are clear. Electronically Signed   By: Franki Cabot M.D.   On: 09/28/2015 16:16   I have personally reviewed and evaluated these images and lab results as part of my medical decision-making.   EKG Interpretation   Date/Time:  Saturday September 28 2015 15:44:14 EDT Ventricular Rate:  116 PR Interval:  136 QRS Duration: 120 QT Interval:  358 QTC Calculation: 497 R Axis:   5 Text Interpretation:  Sinus tachycardia with occasional Premature  ventricular complexes Non-specific intra-ventricular conduction delay  Marked ST abnormality, possible inferolateral subendocardial injury  Abnormal ECG Sinus tachycardia Premature ventricular complexes  Non-specific intra-ventricular conduction delay ST-t wave abnormality No  significant change since last tracing Abnormal ekg Confirmed by Carmin Muskrat  MD [id number]) on 09/28/2015 4:42:44 PM      MDM   Final diagnoses:  Substance abuse  Hypokalemia   Renato Snead presents for detox from cocaine use one hour prior to arrival. Denies SI/HI, audio and visual hallucinations. CXR, EKG, and basic labs ordered.  4:44 PM - patient re-evaluated, tachy in room 106-110. Appears anxious and upset. Will give 5 mg oral valium and 500 NS bolus. Labs at this point nonspecific: Troponin 0.02; WBC 12.6; all others WDL. Will delta trop to ensure no cardiac involvement.   5:17 PM - CMP reviewed. K+ low at 3.1, will give 4mEq K+ to  replenish. Creatinine 1.39; Patient complaining of left hand pain, ibuprofen 800 mg ordered.   6:19 PM - UDS positive for opiates and cocaine; ETOH <5  8:49 PM - 2nd troponin of 0.01. Vitals are stable. Patient is alert and oriented.  Patient is to be discharged home with behavioral health resources for finding a detoxification program. It has been determined that no acute conditions requiring further emergency intervention are present at this time. The patient has been advised of the diagnosis and plan. We  have discussed signs and symptoms that warrant return to the ED, such as changes or worsening in symptoms.   Vital signs are stable at discharge.   BP 125/67 mmHg  Pulse 101  Temp(Src) 98.2 F (36.8 C)  Resp 26  SpO2 97%  Patient has voiced understanding and agreed to follow-up with the PCP.  Patient seen by and discussed with Dr. Vanita Panda who agrees with treatment plan.      [facility] Ward, PA-C 09/28/15 2051  Carmin Muskrat, MD 09/29/15 Dyann Kief

## 2015-09-28 NOTE — ED Notes (Signed)
Patient transported to X-ray 

## 2015-09-28 NOTE — ED Notes (Signed)
Pt in radiology tech will transport pt to room after xray.

## 2015-09-28 NOTE — ED Notes (Signed)
Pt provide with a urinal for a urine specimen. Pt unable to provide a specimen at this time.

## 2015-10-03 ENCOUNTER — Encounter: Payer: Self-pay | Admitting: Internal Medicine

## 2015-10-07 ENCOUNTER — Telehealth: Payer: Self-pay | Admitting: Cardiology

## 2015-10-07 ENCOUNTER — Encounter: Payer: Medicaid Other | Admitting: *Deleted

## 2015-10-07 NOTE — Telephone Encounter (Signed)
Spoke with pt and reminded pt of remote transmission that is due today. Pt verbalized understanding.   

## 2015-10-08 ENCOUNTER — Encounter: Payer: Self-pay | Admitting: Cardiology

## 2015-12-05 ENCOUNTER — Ambulatory Visit (INDEPENDENT_AMBULATORY_CARE_PROVIDER_SITE_OTHER): Payer: Medicaid Other | Admitting: *Deleted

## 2015-12-05 ENCOUNTER — Encounter (HOSPITAL_BASED_OUTPATIENT_CLINIC_OR_DEPARTMENT_OTHER): Payer: Medicaid Other

## 2015-12-05 DIAGNOSIS — I5022 Chronic systolic (congestive) heart failure: Secondary | ICD-10-CM | POA: Diagnosis not present

## 2015-12-05 DIAGNOSIS — I429 Cardiomyopathy, unspecified: Secondary | ICD-10-CM | POA: Diagnosis not present

## 2015-12-05 DIAGNOSIS — I428 Other cardiomyopathies: Secondary | ICD-10-CM

## 2015-12-11 NOTE — Progress Notes (Signed)
Remote ICD transmission.   

## 2015-12-24 LAB — CUP PACEART REMOTE DEVICE CHECK
Battery Remaining Percentage: 83 %
Battery Voltage: 2.98 V
Brady Statistic AP VP Percent: 1 %
Brady Statistic AP VS Percent: 1 %
Brady Statistic AS VP Percent: 96 %
Date Time Interrogation Session: 20170105225126
HIGH POWER IMPEDANCE MEASURED VALUE: 62 Ohm
HIGH POWER IMPEDANCE MEASURED VALUE: 62 Ohm
Implantable Lead Location: 753858
Implantable Lead Location: 753859
Implantable Lead Location: 753860
Implantable Lead Model: 5076
Implantable Lead Serial Number: 247936
Lead Channel Impedance Value: 390 Ohm
Lead Channel Impedance Value: 430 Ohm
Lead Channel Pacing Threshold Amplitude: 0.75 V
Lead Channel Pacing Threshold Amplitude: 0.875 V
Lead Channel Pacing Threshold Pulse Width: 0.5 ms
Lead Channel Pacing Threshold Pulse Width: 0.5 ms
Lead Channel Sensing Intrinsic Amplitude: 12 mV
Lead Channel Sensing Intrinsic Amplitude: 4.1 mV
Lead Channel Setting Pacing Amplitude: 2.5 V
Lead Channel Setting Sensing Sensitivity: 0.5 mV
MDC IDC LEAD IMPLANT DT: 20160115
MDC IDC LEAD IMPLANT DT: 20160115
MDC IDC LEAD IMPLANT DT: 20160310
MDC IDC LEAD MODEL: 511212
MDC IDC MSMT BATTERY REMAINING LONGEVITY: 65 mo
MDC IDC MSMT LEADCHNL RA IMPEDANCE VALUE: 450 Ohm
MDC IDC MSMT LEADCHNL RA PACING THRESHOLD AMPLITUDE: 0.75 V
MDC IDC MSMT LEADCHNL RA PACING THRESHOLD PULSEWIDTH: 0.5 ms
MDC IDC SET LEADCHNL LV PACING AMPLITUDE: 2 V
MDC IDC SET LEADCHNL LV PACING PULSEWIDTH: 0.5 ms
MDC IDC SET LEADCHNL RA PACING AMPLITUDE: 2 V
MDC IDC SET LEADCHNL RV PACING PULSEWIDTH: 0.5 ms
MDC IDC STAT BRADY AS VS PERCENT: 3.1 %
MDC IDC STAT BRADY RA PERCENT PACED: 1 %
Pulse Gen Serial Number: 7209167

## 2015-12-30 ENCOUNTER — Telehealth (HOSPITAL_COMMUNITY): Payer: Self-pay

## 2015-12-30 MED ORDER — SPIRONOLACTONE 25 MG PO TABS: ORAL_TABLET | ORAL | Status: DC

## 2015-12-30 MED ORDER — SACUBITRIL-VALSARTAN 97-103 MG PO TABS: 1.0000 | ORAL_TABLET | Freq: Two times a day (BID) | ORAL | Status: DC

## 2015-12-30 NOTE — Telephone Encounter (Signed)
RX refill given montly for 6 refills: Entresto and Aldactone

## 2016-01-08 ENCOUNTER — Encounter: Payer: Self-pay | Admitting: Cardiology

## 2016-01-23 ENCOUNTER — Encounter (HOSPITAL_COMMUNITY): Payer: Self-pay | Admitting: Internal Medicine

## 2016-01-23 ENCOUNTER — Ambulatory Visit (HOSPITAL_COMMUNITY)
Admission: RE | Admit: 2016-01-23 | Discharge: 2016-01-23 | Disposition: A | Discharge status: Home / Self Care | Payer: Medicaid Other | Source: Ambulatory Visit | Attending: Internal Medicine | Admitting: Internal Medicine

## 2016-01-23 VITALS — BP 140/86 | HR 84 | Wt 241.2 lb

## 2016-01-23 DIAGNOSIS — Z8249 Family history of ischemic heart disease and other diseases of the circulatory system: Secondary | ICD-10-CM | POA: Diagnosis not present

## 2016-01-23 DIAGNOSIS — Z87891 Personal history of nicotine dependence: Secondary | ICD-10-CM | POA: Diagnosis not present

## 2016-01-23 DIAGNOSIS — Z7982 Long term (current) use of aspirin: Secondary | ICD-10-CM | POA: Insufficient documentation

## 2016-01-23 DIAGNOSIS — K219 Gastro-esophageal reflux disease without esophagitis: Secondary | ICD-10-CM | POA: Insufficient documentation

## 2016-01-23 DIAGNOSIS — I428 Other cardiomyopathies: Secondary | ICD-10-CM | POA: Diagnosis not present

## 2016-01-23 DIAGNOSIS — I130 Hypertensive heart and chronic kidney disease with heart failure and stage 1 through stage 4 chronic kidney disease, or unspecified chronic kidney disease: Secondary | ICD-10-CM | POA: Diagnosis not present

## 2016-01-23 DIAGNOSIS — Z8042 Family history of malignant neoplasm of prostate: Secondary | ICD-10-CM | POA: Insufficient documentation

## 2016-01-23 DIAGNOSIS — Z833 Family history of diabetes mellitus: Secondary | ICD-10-CM | POA: Insufficient documentation

## 2016-01-23 DIAGNOSIS — I2510 Atherosclerotic heart disease of native coronary artery without angina pectoris: Secondary | ICD-10-CM | POA: Diagnosis not present

## 2016-01-23 DIAGNOSIS — Z95810 Presence of automatic (implantable) cardiac defibrillator: Secondary | ICD-10-CM | POA: Diagnosis not present

## 2016-01-23 DIAGNOSIS — N189 Chronic kidney disease, unspecified: Secondary | ICD-10-CM | POA: Diagnosis not present

## 2016-01-23 DIAGNOSIS — I5022 Chronic systolic (congestive) heart failure: Secondary | ICD-10-CM | POA: Insufficient documentation

## 2016-01-23 DIAGNOSIS — Z79899 Other long term (current) drug therapy: Secondary | ICD-10-CM | POA: Diagnosis not present

## 2016-01-23 LAB — BASIC METABOLIC PANEL
Anion gap: 9 (ref 5–15)
BUN: 13 mg/dL (ref 6–20)
CALCIUM: 9.3 mg/dL (ref 8.9–10.3)
CO2: 20 mmol/L — AB (ref 22–32)
CREATININE: 1.45 mg/dL — AB (ref 0.61–1.24)
Chloride: 110 mmol/L (ref 101–111)
GFR calc non Af Amer: 53 mL/min — ABNORMAL LOW (ref >60)
Glucose, Bld: 92 mg/dL (ref 65–99)
Potassium: 4.4 mmol/L (ref 3.5–5.1)
SODIUM: 139 mmol/L (ref 135–145)

## 2016-01-23 LAB — BRAIN NATRIURETIC PEPTIDE: B Natriuretic Peptide: 18.3 pg/mL (ref 0.0–100.0)

## 2016-01-23 MED ORDER — HYDRALAZINE HCL 25 MG PO TABS: 25.0000 mg | ORAL_TABLET | Freq: Three times a day (TID) | ORAL | Status: DC

## 2016-01-23 NOTE — Progress Notes (Signed)
Patient ID: Roger Crosby, male   DOB: Mar 31, 1961, 55 y.o.   MRN: [id number]    Advanced Heart Failure Clinic Note   [name] is 55 yo with history of systolic HF due to NICM . EF 25-30%, HTN, LBBB and CKD. 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 ST jude BiV with epicardial lead placed by Dr Cyndia Bent in March 2016.  He returns for today HF follow up. Overall feels pretty good.  Still having bendopnea. Denies DOE. No SOB walking around grocery store, walks as long as he wants on flat ground.  Mild SOB with stairs or up a hill. Weighs himself when he things about it. Taking all medications. He is very active in his church, [employer]. No longer working at [employer]. Got a bad cut on his hand working there, still doing therapy for a severed nerve.    Echo 10/24/14: EF 25-30% RV ok  Labs (9/14): K Roger.9, creatinine 1.6 Labs (10/14): K 4, creatinine 1.Roger Labs (10/05/13): K+ 4.1, creatinine 1.5 Labs (Roger/10/2015): K Roger.8 Creatinine 1.16   PMH: 1. Hypertension  Roger. GERD  Roger. Low back pain  4. LBBB: Newly noted 2011  5. Nonischemic cardiomyopathy: Echo (5/11): EF 45-50%, mild hypokinesis of the apical anteroseptal wall, apical anterior wall, and true apex (LAD distribution), mild LAE.  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  Echo (6/14) with EF 20-25% with inferior/inferoseptal akinesis and hypokinesis elsewhere.  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  6. CKD  7. CPX 07/2015 - Peak VO2: 19.4 (74.8% predicted peak VO2)VE/VCO2 slope: 30.8 OUES: Roger.52Peak RER: 1.06   Family History:  Mother deceased CHF,DM,HTN  Father with heart trouble...died age 72.  1 sister Crohn's disease  1 sister healthy  1 brother died Prostate cancer/AIDS...was diagnosed with prostate cancer at age 10.  Father and mother both had MIs in their 51s   Social History:  Roger [facility]by  [employer]; has been working for [employer]. On disability now.. [employer].  Married  Roger [employer]  Alcohol use-no. None since 2008  Drug use-prior cocaine none since 2008. Denies IVDU.  Nonsmoker Prior smoker none since 2008.   Review of Systems  All systems reviewed and negative except as per HPI.   Current Outpatient Prescriptions  Medication Sig Dispense Refill  . aspirin EC 81 MG tablet Take 1 tablet (81 mg total) by mouth daily.    . carvedilol (COREG) 25 MG tablet Take 1 tablet (25 mg total) by mouth Roger (two) times daily with a meal. 60 tablet Roger  . cephALEXin (KEFLEX) 500 MG capsule Take 500 mg by mouth 4 (four) times daily.  0  . furosemide (LASIX) 20 MG tablet Take 1 tablet (20 mg total) by mouth daily. 30 tablet 6  . omeprazole (PRILOSEC) 20 MG capsule Take 1 capsule (20 mg total) by mouth daily. 30 capsule 6  . sacubitril-valsartan (ENTRESTO) 97-103 MG Take 1 tablet by mouth Roger (two) times daily. 60 tablet 6  . spironolactone (ALDACTONE) 25 MG tablet TAKE 1 TABLET (25 MG TOTAL) BY MOUTH DAILY. 30 tablet 6   No current facility-administered medications for this encounter.    Filed Vitals:   01/23/16 1351  BP: 140/86  Pulse: 84  Weight: 241 lb 4 oz (109.43 kg)  SpO2: 98%   Wt Readings from Last Roger Encounters:  01/23/16 241 lb  4 oz (109.43 kg)  09/20/15 238 lb (107.956 kg)  09/19/15 238 lb 8 oz (108.183 kg)    General: NAD Neck: JVD 5-6, no thyromegaly or thyroid nodule.  Lungs: Clear to auscultation bilaterally with normal respiratory effort. CV: Nondisplaced PMI.  Heart regular S1/S2, no S3/S4, no murmur.  trace peripheral edema.  No carotid bruit.  Normal pedal pulses. L chest scar .  Abdomen: Soft, nontender, no hepatosplenomegaly, no distention.  Neurologic: Alert and oriented x Roger.  Psych: Normal affect. Extremities: No clubbing or cyanosis.   Assessment/Plan:  1. Chronic Systolic Heart Failure. NICM. ECHO 09/2014 EF 25-30%  - Persistent  NYHA II-III symptoms. EF persistently < 35%. S/P St Jude ICD BiV . Epicardial lead placed by Dr Cyndia Bent.  - Continue coreg 25 mg twice daily - Continue entresto 97-103 twice a day.   - Start hydralazine 25 mg TID. No imdur with occasional ED drug use.  - Volume status stable by exam and corevue.  - Continue lasix 20 mg daily  - Continue spironolactone 25 mg daily.  - Reinforced the need and importance of daily weights, a low sodium diet, and fluid restriction (less than Roger L a day). Instructed to call the HF clinic if weight increases more than Roger lbs overnight or 5 lbs in a week.   - Reinforced need to take more responsibility for his medicines and caring for his HF.  -CPX results discussed.   - Due for Echo.  Roger. CKD:  -Recheck BMET today.  Roger. HTN:  - Up a little. Adding hydralazine as above.  4. OSA - Has sleep study in April  Follow up in Roger-4 months. Schedule Echo.   [name] PA-C  Roger/23/2017  Patient seen and examined with [name], PA-C. We discussed all aspects of the encounter. I agree with the assessment and plan as stated above.   Doing well. Start hydralazine (no imdur with PDE-5 use). Repeat echo. Await results of sleep study.   Bensimhon, Daniel,MD 9:46 PM

## 2016-01-23 NOTE — Patient Instructions (Signed)
Start Hydralazine 25 mg Three times a day   Labs today  Your physician has requested that you have an echocardiogram. Echocardiography is a painless test that uses sound waves to create images of your heart. It provides your doctor with information about the size and shape of your heart and how well your heart's chambers and valves are working. This procedure takes approximately one hour. There are no restrictions for this procedure.  Your physician recommends that you schedule a follow-up appointment in: 3-4 months

## 2016-01-24 ENCOUNTER — Ambulatory Visit (HOSPITAL_BASED_OUTPATIENT_CLINIC_OR_DEPARTMENT_OTHER): Payer: Medicaid Other

## 2016-02-06 ENCOUNTER — Other Ambulatory Visit (HOSPITAL_COMMUNITY): Payer: Medicaid Other

## 2016-02-10 ENCOUNTER — Other Ambulatory Visit (HOSPITAL_COMMUNITY): Payer: Medicaid Other

## 2016-02-11 ENCOUNTER — Other Ambulatory Visit: Payer: Self-pay | Admitting: *Deleted

## 2016-02-11 DIAGNOSIS — G4733 Obstructive sleep apnea (adult) (pediatric): Secondary | ICD-10-CM

## 2016-02-25 ENCOUNTER — Other Ambulatory Visit (HOSPITAL_COMMUNITY): Payer: Medicaid Other

## 2016-03-10 ENCOUNTER — Ambulatory Visit (HOSPITAL_COMMUNITY): Payer: Medicaid Other

## 2016-03-18 ENCOUNTER — Encounter (HOSPITAL_BASED_OUTPATIENT_CLINIC_OR_DEPARTMENT_OTHER): Payer: Medicaid Other

## 2016-03-22 ENCOUNTER — Other Ambulatory Visit (HOSPITAL_COMMUNITY): Payer: Self-pay | Admitting: Internal Medicine

## 2016-04-01 ENCOUNTER — Other Ambulatory Visit (HOSPITAL_COMMUNITY): Payer: Medicaid Other

## 2016-04-03 ENCOUNTER — Other Ambulatory Visit (HOSPITAL_COMMUNITY): Payer: Medicaid Other

## 2016-04-06 ENCOUNTER — Emergency Department (HOSPITAL_COMMUNITY)
Admission: EM | Admit: 2016-04-06 | Discharge: 2016-04-06 | Disposition: A | Discharge status: Home / Self Care | Payer: Medicaid Other | Source: Home / Self Care

## 2016-04-06 ENCOUNTER — Encounter (HOSPITAL_COMMUNITY): Payer: Self-pay | Admitting: *Deleted

## 2016-04-06 ENCOUNTER — Emergency Department (HOSPITAL_COMMUNITY): Payer: Medicaid Other

## 2016-04-06 ENCOUNTER — Encounter (HOSPITAL_COMMUNITY): Payer: Self-pay | Admitting: Emergency Medicine

## 2016-04-06 ENCOUNTER — Observation Stay (HOSPITAL_COMMUNITY)
Admission: EM | Admit: 2016-04-06 | Discharge: 2016-04-08 | Disposition: A | Discharge status: Home / Self Care | Payer: Medicaid Other | Attending: Internal Medicine | Admitting: Internal Medicine

## 2016-04-06 DIAGNOSIS — R0789 Other chest pain: Principal | ICD-10-CM | POA: Insufficient documentation

## 2016-04-06 DIAGNOSIS — R61 Generalized hyperhidrosis: Secondary | ICD-10-CM | POA: Insufficient documentation

## 2016-04-06 DIAGNOSIS — R079 Chest pain, unspecified: Secondary | ICD-10-CM

## 2016-04-06 DIAGNOSIS — F1910 Other psychoactive substance abuse, uncomplicated: Secondary | ICD-10-CM

## 2016-04-06 DIAGNOSIS — Z95810 Presence of automatic (implantable) cardiac defibrillator: Secondary | ICD-10-CM | POA: Insufficient documentation

## 2016-04-06 DIAGNOSIS — N183 Chronic kidney disease, stage 3 unspecified: Secondary | ICD-10-CM

## 2016-04-06 DIAGNOSIS — I2510 Atherosclerotic heart disease of native coronary artery without angina pectoris: Secondary | ICD-10-CM | POA: Insufficient documentation

## 2016-04-06 DIAGNOSIS — Z87891 Personal history of nicotine dependence: Secondary | ICD-10-CM | POA: Insufficient documentation

## 2016-04-06 DIAGNOSIS — I5022 Chronic systolic (congestive) heart failure: Secondary | ICD-10-CM | POA: Diagnosis present

## 2016-04-06 DIAGNOSIS — R0602 Shortness of breath: Secondary | ICD-10-CM | POA: Insufficient documentation

## 2016-04-06 DIAGNOSIS — Z79899 Other long term (current) drug therapy: Secondary | ICD-10-CM | POA: Diagnosis not present

## 2016-04-06 DIAGNOSIS — Z7982 Long term (current) use of aspirin: Secondary | ICD-10-CM | POA: Insufficient documentation

## 2016-04-06 DIAGNOSIS — K219 Gastro-esophageal reflux disease without esophagitis: Secondary | ICD-10-CM | POA: Diagnosis not present

## 2016-04-06 DIAGNOSIS — I428 Other cardiomyopathies: Secondary | ICD-10-CM

## 2016-04-06 DIAGNOSIS — F17210 Nicotine dependence, cigarettes, uncomplicated: Secondary | ICD-10-CM

## 2016-04-06 DIAGNOSIS — I429 Cardiomyopathy, unspecified: Secondary | ICD-10-CM | POA: Diagnosis not present

## 2016-04-06 DIAGNOSIS — I129 Hypertensive chronic kidney disease with stage 1 through stage 4 chronic kidney disease, or unspecified chronic kidney disease: Secondary | ICD-10-CM | POA: Insufficient documentation

## 2016-04-06 DIAGNOSIS — N189 Chronic kidney disease, unspecified: Secondary | ICD-10-CM | POA: Insufficient documentation

## 2016-04-06 DIAGNOSIS — I5020 Unspecified systolic (congestive) heart failure: Secondary | ICD-10-CM

## 2016-04-06 LAB — BASIC METABOLIC PANEL
ANION GAP: 11 (ref 5–15)
BUN: 13 mg/dL (ref 6–20)
CALCIUM: 9.1 mg/dL (ref 8.9–10.3)
CO2: 20 mmol/L — AB (ref 22–32)
CREATININE: 1.43 mg/dL — AB (ref 0.61–1.24)
Chloride: 108 mmol/L (ref 101–111)
GFR, EST NON AFRICAN AMERICAN: 54 mL/min — AB (ref >60)
GLUCOSE: 99 mg/dL (ref 65–99)
Potassium: 3.8 mmol/L (ref 3.5–5.1)
Sodium: 139 mmol/L (ref 135–145)

## 2016-04-06 LAB — CBC
HCT: 41.8 % (ref 39.0–52.0)
Hemoglobin: 13.7 g/dL (ref 13.0–17.0)
MCH: 25.7 pg — AB (ref 26.0–34.0)
MCHC: 32.8 g/dL (ref 30.0–36.0)
MCV: 78.3 fL (ref 78.0–100.0)
PLATELETS: 272 10*3/uL (ref 150–400)
RBC: 5.34 MIL/uL (ref 4.22–5.81)
RDW: 14.9 % (ref 11.5–15.5)
WBC: 8.5 10*3/uL (ref 4.0–10.5)

## 2016-04-06 LAB — I-STAT TROPONIN, ED: TROPONIN I, POC: 0 ng/mL (ref 0.00–0.08)

## 2016-04-06 NOTE — ED Notes (Signed)
Pt states he had bloodwork done at [facility] prior to arrival as he left from there.

## 2016-04-06 NOTE — ED Notes (Signed)
Pt reports onset of mid chest pressure approx one hour ago and pt is feeling sob, appears slightly diaphoretic at triage. ekg done. BP 94/49.

## 2016-04-06 NOTE — ED Notes (Addendum)
Pt states that has had CP since yesterday. SOB. Alert and oriented. 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

## 2016-04-06 NOTE — ED Notes (Signed)
Called pt to obtain vitals, pt was not in waiting room.

## 2016-04-07 ENCOUNTER — Encounter (HOSPITAL_COMMUNITY): Payer: Self-pay | Admitting: *Deleted

## 2016-04-07 DIAGNOSIS — R0789 Other chest pain: Secondary | ICD-10-CM | POA: Diagnosis not present

## 2016-04-07 DIAGNOSIS — I5022 Chronic systolic (congestive) heart failure: Secondary | ICD-10-CM | POA: Diagnosis not present

## 2016-04-07 DIAGNOSIS — N183 Chronic kidney disease, stage 3 unspecified: Secondary | ICD-10-CM

## 2016-04-07 DIAGNOSIS — F1910 Other psychoactive substance abuse, uncomplicated: Secondary | ICD-10-CM | POA: Diagnosis not present

## 2016-04-07 HISTORY — DX: Other chest pain: R07.89

## 2016-04-07 LAB — I-STAT TROPONIN, ED: TROPONIN I, POC: 0 ng/mL (ref 0.00–0.08)

## 2016-04-07 LAB — RAPID URINE DRUG SCREEN, HOSP PERFORMED
AMPHETAMINES: NOT DETECTED
BENZODIAZEPINES: NOT DETECTED
Barbiturates: NOT DETECTED
Cocaine: POSITIVE — AB
OPIATES: NOT DETECTED
Tetrahydrocannabinol: NOT DETECTED

## 2016-04-07 LAB — TROPONIN I

## 2016-04-07 MED ORDER — ASPIRIN 81 MG PO CHEW
324.0000 mg | CHEWABLE_TABLET | Freq: Once | ORAL | Status: AC
  Administered 2016-04-07: 324 mg via ORAL
  Filled 2016-04-07: qty 4

## 2016-04-07 MED ORDER — CARVEDILOL 25 MG PO TABS
25.0000 mg | ORAL_TABLET | Freq: Two times a day (BID) | ORAL | Status: DC
  Administered 2016-04-07: 25 mg via ORAL
  Filled 2016-04-07: qty 1

## 2016-04-07 MED ORDER — SPIRONOLACTONE 25 MG PO TABS
25.0000 mg | ORAL_TABLET | Freq: Every day | ORAL | Status: DC
  Administered 2016-04-07 – 2016-04-08 (×2): 25 mg via ORAL
  Filled 2016-04-07 (×2): qty 1

## 2016-04-07 MED ORDER — ASPIRIN EC 81 MG PO TBEC
81.0000 mg | DELAYED_RELEASE_TABLET | Freq: Every day | ORAL | Status: DC
  Administered 2016-04-08: 81 mg via ORAL
  Filled 2016-04-07: qty 1

## 2016-04-07 MED ORDER — ALUM & MAG HYDROXIDE-SIMETH 200-200-20 MG/5ML PO SUSP
30.0000 mL | ORAL | Status: DC | PRN
  Administered 2016-04-07: 30 mL via ORAL
  Filled 2016-04-07: qty 30

## 2016-04-07 MED ORDER — NITROGLYCERIN 0.4 MG SL SUBL: 0.4000 mg | SUBLINGUAL_TABLET | SUBLINGUAL | Status: DC | PRN

## 2016-04-07 MED ORDER — FUROSEMIDE 20 MG PO TABS
20.0000 mg | ORAL_TABLET | Freq: Every day | ORAL | Status: DC
  Administered 2016-04-07 – 2016-04-08 (×2): 20 mg via ORAL
  Filled 2016-04-07 (×2): qty 1

## 2016-04-07 MED ORDER — ASPIRIN EC 81 MG PO TBEC
81.0000 mg | DELAYED_RELEASE_TABLET | Freq: Every day | ORAL | Status: DC
Start: 2016-04-07 — End: 2016-04-07

## 2016-04-07 MED ORDER — SACUBITRIL-VALSARTAN 97-103 MG PO TABS
1.0000 | ORAL_TABLET | Freq: Two times a day (BID) | ORAL | Status: DC
  Administered 2016-04-07 – 2016-04-08 (×2): 1 via ORAL
  Filled 2016-04-07 (×4): qty 1

## 2016-04-07 MED ORDER — ENOXAPARIN SODIUM 40 MG/0.4ML ~~LOC~~ SOLN
40.0000 mg | SUBCUTANEOUS | Status: DC
  Administered 2016-04-07: 40 mg via SUBCUTANEOUS
  Filled 2016-04-07: qty 0.4

## 2016-04-07 MED ORDER — ASPIRIN EC 81 MG PO TBEC: 81.0000 mg | DELAYED_RELEASE_TABLET | Freq: Every day | ORAL | Status: DC

## 2016-04-07 MED ORDER — ACETAMINOPHEN 325 MG PO TABS: 650.0000 mg | ORAL_TABLET | ORAL | Status: DC | PRN

## 2016-04-07 MED ORDER — HYDRALAZINE HCL 25 MG PO TABS
25.0000 mg | ORAL_TABLET | Freq: Three times a day (TID) | ORAL | Status: DC
  Administered 2016-04-07 – 2016-04-08 (×3): 25 mg via ORAL
  Filled 2016-04-07 (×3): qty 1

## 2016-04-07 MED ORDER — ONDANSETRON HCL 4 MG/2ML IJ SOLN: 4.0000 mg | Freq: Four times a day (QID) | INTRAMUSCULAR | Status: DC | PRN

## 2016-04-07 MED ORDER — PANTOPRAZOLE SODIUM 40 MG PO TBEC
40.0000 mg | DELAYED_RELEASE_TABLET | Freq: Every day | ORAL | Status: DC
  Administered 2016-04-07 – 2016-04-08 (×2): 40 mg via ORAL
  Filled 2016-04-07 (×2): qty 1

## 2016-04-07 NOTE — H&P (Signed)
History & Physical    Patient ID: Roger Crosby MRN: [id number], DOB/AGE: 03-26-1961   Admit date: 04/06/2016  Primary Physician: Lance Bosch, NP Primary Cardiologist: Dr. Haroldine Laws  History of Present Illness    Roger Crosby is a 55 y.o. male with past medical history of nonischemic cardiomyopathy (EF 25-30%, s/p St. 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  The patient reports having a shooting pain along his left pectoral region for the past 3 weeks. Reports the discomfort can present at rest or with exertion and only lasts for several seconds. Denies any associated diaphoresis, nausea, vomiting or radiating pain. Says the pain seems to radiate from his left nipple, with his nipple being tender to palpation. Does report experiencing mild dyspnea yesterday with the pain and that is what prompted him to seek evaluation. He initially came to [facility] yesterday but left prior to being seen due to his pain resolving. His pain represented this AM and that is when he went to [facility].   He reports good compliance with his cardiac medications. Reports a stable weight of approximately 230 lbs. Denies any recent orthopnea, PND, or lower extremity edema.   Reports a strong family history of CAD with both of his parents having MI's (mom in her 64's, father in his 33's). He reports smoking 2-3 cigarettes per day. Occasional 1-2 beers on the weekend. Denies any recent cocaine use (reports last using 2+ years ago, UDS was positive for opiates and cocaine 6 months ago).   While in the ED, his initial two troponin values were negative. BMET shows a stable creatinine of 1.43. WBC 8.5. Hgb 13.7. CXR with no acute disease noted. His initial EKG on 04/06/2016 showed TWI in the inferolateral leads. In his  most recent tracing, the TWI has resolved.  He denies any pain at the time of this encounter.    Past Medical History    Past Medical History  Diagnosis Date  . HTN (hypertension)   . GERD (gastroesophageal reflux disease)   . LBP (low back pain)   . LBBB (left bundle branch block)   . Hx of echocardiogram     echo 5/11: EF 45-50%, mild HK of Ap AS wall, ap Ant wall and true apex (LAD distribution), mild LAE  . NICM (nonischemic cardiomyopathy) ([place])   . CAD (coronary artery disease)     nonobstructive by [facility] 6/11: oOM1 40%, mild luminal irregs elsewhere  . Systolic heart failure ([place])   . AICD (automatic cardioverter/defibrillator) present     a.  Unsuccessful LV lead placement 11/2014  . High cholesterol   . CKD (chronic kidney disease)     Past Surgical History  Procedure Laterality Date  . Left heart catheterization with coronary angiogram N/A 08/04/2013    Procedure: LEFT HEART CATHETERIZATION WITH CORONARY ANGIOGRAM;  Surgeon: Larey Dresser, MD;  Location: [facility] CATH LAB;  Service: Cardiovascular;  Laterality: N/A;  . Cardiac catheterization Left 07/2013    with angiogram  . Bi-ventricular implantable cardioverter defibrillator N/A 12/14/2014    with failed LV lead placeemnt  . Thoracotomy Left 02/07/2015    Procedure: THORACOTOMY MAJOR;  Surgeon: Gaye Pollack, MD;  Location: [facility] OR;  Service: Thoracic;  Laterality: Left;  . Epicardial pacing lead placement N/A  02/07/2015    eicardial LV lead placed by Dr Laverta Baltimore  . Tendon repair Left 09/20/2015    Procedure: repair nerve tendon wrist;  Surgeon: Dayna Barker, MD;  Location: [place];  Service: Plastics;  Laterality: Left;  . Debridement and closure wound Right 09/20/2015    Procedure: DEBRIDEMENT AND CLOSURE WOUND;  Surgeon: Dayna Barker, MD;  Location: [place];  Service: Plastics;  Laterality: Right;     Allergies  No Known Allergies   Home Medications    Prior to Admission medications   Medication Sig Start Date End  Date Taking? Authorizing Provider  aspirin EC 81 MG tablet Take 1 tablet (81 mg total) by mouth daily. 08/29/12  Yes Scott T Kathlen Mody, PA-C  carvedilol (COREG) 25 MG tablet TAKE 1 TABLET (25 MG TOTAL) BY MOUTH 2 (TWO) TIMES DAILY WITH A MEAL. 03/23/16  Yes Jolaine Artist, MD  furosemide (LASIX) 20 MG tablet Take 1 tablet (20 mg total) by mouth daily. 12/27/14  Yes Jolaine Artist, MD  hydrALAZINE (APRESOLINE) 25 MG tablet Take 1 tablet (25 mg total) by mouth 3 (three) times daily. 01/23/16  Yes Jolaine Artist, MD  omeprazole (PRILOSEC) 20 MG capsule Take 1 capsule (20 mg total) by mouth daily. 09/19/15  Yes Shaune Pascal Bensimhon, MD  sacubitril-valsartan (ENTRESTO) 97-103 MG Take 1 tablet by mouth 2 (two) times daily. 12/30/15  Yes Jolaine Artist, MD  spironolactone (ALDACTONE) 25 MG tablet TAKE 1 TABLET (25 MG TOTAL) BY MOUTH DAILY. 12/30/15  Yes Jolaine Artist, MD    Family History    Family History  Problem Relation Age of Onset  . Heart attack Mother     in 93's  . Heart disease Mother     ischemic  . Heart attack Father     in 1's  . Heart disease Father     ischemic  . Congestive Heart Failure Mother   . Diabetes Mother   . Hypertension Mother   . Heart Problems Father   . Crohn's disease Sister   . Prostate cancer Brother   . HIV/AIDS Brother     Social History    Social History   Social History  . Marital Status: Married    Spouse Name: N/A  . Number of Children: N/A  . Years of Education: N/A   Occupational History  . Not on file.   Social History Main Topics  . Smoking status: Current Every Day Smoker -- 0.12 packs/day for 25 years    Types: Cigarettes  . Smokeless tobacco: Never Used  . Alcohol Use: No  . Drug Use: No     Comment: 12/14/2014 "last crack was in 2014"  . Sexual Activity: Yes   Other Topics Concern  . Not on file   Social History Narrative     Review of Systems    General:  No chills, fever, night sweats or weight changes.    Cardiovascular:  No dyspnea on exertion, edema, orthopnea, palpitations, paroxysmal nocturnal dyspnea. Positive for chest pain.  Dermatological: No rash, lesions/masses Respiratory: No cough, dyspnea Urologic: No hematuria, dysuria Abdominal:   No nausea, vomiting, diarrhea, bright red blood per rectum, melena, or hematemesis Neurologic:  No visual changes, wkns, changes in mental status. All other systems reviewed and are otherwise negative except as noted above.  Physical Exam    Blood pressure 150/86, pulse 72, temperature 98.4 F (36.9 C), temperature source Oral, resp. rate 16, height 5\' 8"  (1.727 m), weight 230 lb (  104.327 kg), SpO2 98 %.  General: Well developed, well nourished,male in no acute distress. Head: Normocephalic, atraumatic, sclera non-icteric, no xanthomas, nares are without discharge. Dentition:  Neck: No carotid bruits. JVD not elevated.  Lungs: Respirations regular and unlabored, without wheezes or rales.  Heart: Regular rate and rhythm. No S3 or S4.  No murmur, no rubs, or gallops appreciated. Tender to palpation along left pectoral region. Abdomen: Soft, non-tender, non-distended with normoactive bowel sounds. No hepatomegaly. No rebound/guarding. No obvious abdominal masses. Msk:  Strength and tone appear normal for age. No joint deformities or effusions. Extremities: No clubbing or cyanosis. No edema.  Distal pedal pulses are 2+ bilaterally. Neuro: Alert and oriented X 3. Moves all extremities spontaneously. No focal deficits noted. Psych:  Responds to questions appropriately with a normal affect. Skin: No rashes or lesions noted.  Labs    Troponin [facility] of Care Test)  Recent Labs  04/07/16 0640  TROPIPOC 0.00   No results for input(s): CKTOTAL, CKMB, TROPONINI in the last 72 hours. Lab Results  Component Value Date   WBC 8.5 04/06/2016   HGB 13.7 04/06/2016   HCT 41.8 04/06/2016   MCV 78.3 04/06/2016   PLT 272 04/06/2016     Recent Labs Lab  04/06/16 1500  NA 139  K 3.8  CL 108  CO2 20*  BUN 13  CREATININE 1.43*  CALCIUM 9.1  GLUCOSE 99   Lab Results  Component Value Date   CHOL 186 01/30/2013   HDL 38.60* 01/30/2013   LDLCALC 135* 01/30/2013   TRIG 62.0 01/30/2013   No results found for: DDIMER   B NATRIURETIC PEPTIDE  Date/Time Value Ref Range Status  01/23/2016 02:45 PM 18.3 0.0 - 100.0 pg/mL Final   PRO B NATRIURETIC PEPTIDE (BNP)  Date/Time Value Ref Range Status  10/24/2014 12:47 PM 285.6* 0 - 125 pg/mL Final  08/22/2014 11:33 AM 659.6* 0 - 125 pg/mL Final   No results for input(s): INR in the last 72 hours.    Radiology Studies    Dg Chest 2 View: 04/06/2016  CLINICAL DATA:  Chest pain and shortness of breath beginning at 1:30 p.m. today. Initial encounter. EXAM: CHEST  2 VIEW COMPARISON:  PA and lateral chest 09/28/2015 and 04/03/2015. FINDINGS: The lungs are clear. No pneumothorax or pleural effusion. Heart size is normal. Pacing device remains in place. No focal bony abnormality. IMPRESSION: No acute disease. Electronically Signed   By: Inge Rise M.D.   On: 04/06/2016 15:22    EKG & Cardiac Imaging    EKG: 04/06/2016: NSR, HR 81. TWI in inferolateral leads (now resolved by tracing on 04/07/2016)  ECHOCARDIOGRAM: 10/24/2014 Study Conclusions: - Left ventricle: The cavity size was moderately dilated. Systolic function was severely reduced. The estimated ejection fraction was in the range of 25% to 30%. Diffuse hypokinesis. There is akinesis of the anteroseptal and inferoseptal myocardium. Doppler parameters are consistent with abnormal left ventricular relaxation (grade 1 diastolic dysfunction). - Mitral valve: There was mild regurgitation. - Left atrium: The atrium was moderately dilated.  Impressions: - Compared to the prior study, there has been no significant interval change.  Assessment & Plan    1. Atypical chest pain - reports having a shooting pain along his left  pectoral region, radiating from his left nipple and only lasting for seconds with each episode. Pain is reproducible with palpation. Reports associated dyspnea which started yesterday. No exertional component noted. - his initial EKG on 04/06/2016 showed TWI in the inferolateral  leads. In his most recent tracing, the TWI has resolved. - initial two troponin values have been negative. Will continue to cyclic enzymes. - overall his pain sounds atypical. His last catheterization was in 2014 and showed no significant CAD. Underwent an exercise test in 07/2015 which "demonstrated a mild functional impairment when compared to matched sedentary norms. The major limitation to exercise appears to be the patient's obesity. There was mild chronotropic incompetence."  - discussed with Dr. Acie Fredrickson, will plan for a [facility] tomorrow morning. NPO after midnight and hold morning BB dose.  - will not start Heparin at this time with his atypical symptoms. If troponin values become elevated, will start at that time.  2. Nonischemic Cardiomyopathy/ Chronic systolic CHF - EF [id number] by echo in 09/2014, s/p St. Jude BiV 01/2015. Cath in 07/2013 showing no significant CAD. - does not appear volume overloaded on physical exam.  - continue BB, Entresto, Lasix, and Spironolactone.  3. CKD (chronic kidney disease) stage 3, GFR 30-59 ml/min - creatinine stable at 1.43 on admission. - continue to monitor.  4. History of Substance Abuse - denies any recent cocaine use (reports last using 2+ years ago, although UDS was positive for opiates and cocaine 6 months ago).  - repeat UDS pending.  Arna Medici, PA-C 04/07/2016, 11:54 AM Pager: [phone]  Attending Note:   The patient was seen and examined.  Agree with assessment and plan as noted above.  Changes made to the above note as needed.  Pt presents with some atypical CP  The symptoms are somewhat atypical  Given his hx , will get a myoview  tomorrow Continue to cycle troponins .   Thayer Headings, Brooke Bonito., MD, [facility] 04/07/2016, 12:46 PM [address]  [place] Pager [phone]

## 2016-04-07 NOTE — ED Provider Notes (Signed)
CSN: [id number]     Arrival date & time 04/06/16  2003 History   First MD Initiated Contact with Patient 04/07/16 [phone]     Chief Complaint  Patient presents with  . Chest Pain     (Consider location/radiation/quality/duration/timing/severity/associated sxs/prior Treatment) HPI Comments: 55yo M w/ extensive PMH including AICD, NICM, CAD, LBBB, CKD, sCHF, GERD who p/w chest pain. 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 The chest pressure was associated with shortness of breath and diaphoresis. It continued throughout the day and gradually worsened. He presented to [facility], where he had lab work drawn and later left to come here prior to evaluation. He reports that his symptoms resolved by 9 PM last night and have not returned. Currently he denies any symptoms. He states he is compliant with his medications. No fevers, vomiting, diarrhea, cough/cold symptoms, or recent illness. He denies recent cocaine use.  Patient is a 55 y.o. male presenting with chest pain. The history is provided by the patient.  Chest Pain   Past Medical History  Diagnosis Date  . HTN (hypertension)   . GERD (gastroesophageal reflux disease)   . LBP (low back pain)   . LBBB (left bundle branch block)   . Hx of echocardiogram     echo 5/11: EF 45-50%, mild HK of Ap AS wall, ap Ant wall and true apex (LAD distribution), mild LAE  . NICM (nonischemic cardiomyopathy) ([place])   . CAD (coronary artery disease)     nonobstructive by [facility] 6/11: oOM1 40%, mild luminal irregs elsewhere  . Systolic heart failure ([place])   . AICD (automatic cardioverter/defibrillator) present     a.  Unsuccessful LV lead placement 11/2014  . High cholesterol   . CKD (chronic kidney disease)    Past Surgical History  Procedure Laterality Date  . Left heart catheterization with coronary angiogram N/A 08/04/2013    Procedure: LEFT HEART CATHETERIZATION  WITH CORONARY ANGIOGRAM;  Surgeon: Larey Dresser, MD;  Location: [facility] CATH LAB;  Service: Cardiovascular;  Laterality: N/A;  . Cardiac catheterization Left 07/2013    with angiogram  . Bi-ventricular implantable cardioverter defibrillator N/A 12/14/2014    with failed LV lead placeemnt  . Thoracotomy Left 02/07/2015    Procedure: THORACOTOMY MAJOR;  Surgeon: Gaye Pollack, MD;  Location: [facility] OR;  Service: Thoracic;  Laterality: Left;  . Epicardial pacing lead placement N/A 02/07/2015    eicardial LV lead placed by Dr Laverta Baltimore  . Tendon repair Left 09/20/2015    Procedure: repair nerve tendon wrist;  Surgeon: Dayna Barker, MD;  Location: [place];  Service: Plastics;  Laterality: Left;  . Debridement and closure wound Right 09/20/2015    Procedure: DEBRIDEMENT AND CLOSURE WOUND;  Surgeon: Dayna Barker, MD;  Location: [place];  Service: Plastics;  Laterality: Right;   Family History  Problem Relation Age of Onset  . Heart attack Mother     in 46's  . Heart disease Mother     ischemic  . Heart attack Father     in 37's  . Heart disease Father     ischemic  . Congestive Heart Failure Mother   . Diabetes Mother   . Hypertension Mother   . Heart Problems Father   . Crohn's disease Sister   . Prostate cancer Brother   . HIV/AIDS Brother    Social History  Substance Use Topics  . Smoking status: Former  Smoker -- 0.12 packs/day for 25 years    Types: Cigarettes    Quit date: 07/31/2014  . Smokeless tobacco: Never Used  . Alcohol Use: No    Review of Systems  Cardiovascular: Positive for chest pain.   10 Systems reviewed and are negative for acute change except as noted in the HPI.    Allergies  Review of patient's allergies indicates no known allergies.  Home Medications   Prior to Admission medications   Medication Sig Start Date End Date Taking? Authorizing Provider  aspirin EC 81 MG tablet Take 1 tablet (81 mg total) by mouth daily. 08/29/12  Yes Scott T Kathlen Mody, PA-C   carvedilol (COREG) 25 MG tablet TAKE 1 TABLET (25 MG TOTAL) BY MOUTH 2 (TWO) TIMES DAILY WITH A MEAL. 03/23/16  Yes Jolaine Artist, MD  furosemide (LASIX) 20 MG tablet Take 1 tablet (20 mg total) by mouth daily. 12/27/14  Yes Jolaine Artist, MD  hydrALAZINE (APRESOLINE) 25 MG tablet Take 1 tablet (25 mg total) by mouth 3 (three) times daily. 01/23/16  Yes Jolaine Artist, MD  omeprazole (PRILOSEC) 20 MG capsule Take 1 capsule (20 mg total) by mouth daily. 09/19/15  Yes Shaune Pascal Bensimhon, MD  sacubitril-valsartan (ENTRESTO) 97-103 MG Take 1 tablet by mouth 2 (two) times daily. 12/30/15  Yes Jolaine Artist, MD  spironolactone (ALDACTONE) 25 MG tablet TAKE 1 TABLET (25 MG TOTAL) BY MOUTH DAILY. 12/30/15  Yes Shaune Pascal Bensimhon, MD   BP 133/85 mmHg  Pulse 73  Temp(Src) 98.8 F (37.1 C) (Oral)  Resp 20  SpO2 99% Physical Exam  Constitutional: He is oriented to person, place, and time. He appears well-developed and well-nourished. No distress.  HENT:  Head: Normocephalic and atraumatic.  Moist mucous membranes  Eyes: Pupils are equal, round, and reactive to light.  B/l conjunctival injection  Neck: Neck supple.  Cardiovascular: Normal rate, regular rhythm and normal heart sounds.   No murmur heard. Pulmonary/Chest: Effort normal and breath sounds normal.  Pacemaker in L upper chest  Abdominal: Soft. Bowel sounds are normal. He exhibits no distension. There is no tenderness.  Musculoskeletal: He exhibits no edema.  Neurological: He is alert and oriented to person, place, and time.  Fluent speech  Skin: Skin is warm and dry.  Psychiatric: He has a normal mood and affect. Judgment normal.  Nursing note and vitals reviewed.   ED Course  Procedures (including critical care time) Labs Review Labs Reviewed  Randolm Idol, ED     Results for orders placed or performed during the hospital encounter of [id number]  Basic metabolic panel  Result Value Ref Range   Sodium 139  135 - 145 mmol/L   Potassium 3.8 3.5 - 5.1 mmol/L   Chloride 108 101 - 111 mmol/L   CO2 20 (L) 22 - 32 mmol/L   Glucose, Bld 99 65 - 99 mg/dL   BUN 13 6 - 20 mg/dL   Creatinine, Ser 1.43 (H) 0.61 - 1.24 mg/dL   Calcium 9.1 8.9 - 10.3 mg/dL   GFR calc non Af Amer 54 (L) >60 mL/min   GFR calc Af Amer >60 >60 mL/min   Anion gap 11 5 - 15  CBC  Result Value Ref Range   WBC 8.5 4.0 - 10.5 K/uL   RBC 5.34 4.22 - 5.81 MIL/uL   Hemoglobin 13.7 13.0 - 17.0 g/dL   HCT 41.8 39.0 - 52.0 %   MCV 78.3 78.0 - 100.0 fL  MCH 25.7 (L) 26.0 - 34.0 pg   MCHC 32.8 30.0 - 36.0 g/dL   RDW 14.9 11.5 - 15.5 %   Platelets 272 150 - 400 K/uL  I-stat troponin, ED  Result Value Ref Range   Troponin i, poc 0.00 0.00 - 0.08 ng/mL   Comment 3            Imaging Review Dg Chest 2 View  04/06/2016  CLINICAL DATA:  Chest pain and shortness of breath beginning at 1:30 p.m. today. Initial encounter. EXAM: CHEST  2 VIEW COMPARISON:  PA and lateral chest 09/28/2015 and 04/03/2015. FINDINGS: The lungs are clear. No pneumothorax or pleural effusion. Heart size is normal. Pacing device remains in place. No focal bony abnormality. IMPRESSION: No acute disease. Electronically Signed   By: Inge Rise M.D.   On: 04/06/2016 15:22   I have personally reviewed and evaluated these lab results as part of my medical decision-making.   EKG Interpretation   Date/Time:  Tuesday Apr 07 2016 05:13:01 EDT Ventricular Rate:  76 PR Interval:  152 QRS Duration: 120 QT Interval:  422 QTC Calculation: 474 R Axis:   -37 Text Interpretation:  Failure to sense and/or capture (?magnet) Sinus  bradycardia Nonspecific IVCD with LAD Consider anterior infarct paced  rhythm, T wave inversion in II, aVF, V4-V6 on previous EKG have normalized  Confirmed by Shristi Scheib MD, Evetta Renner [id number]) on 04/07/2016 5:35:27 AM Also  confirmed by Heath Tesler MD, Sheretha Shadd [phone]), editor [place]  CCT, [place]  ([id number])  on 04/07/2016 7:25:39 AM     Medications   aspirin chewable tablet 324 mg (324 mg Oral Given 04/07/16 0600)    MDM   Final diagnoses:  Chest pain, unspecified chest pain type  Shortness of breath   PT w/ extensive cardiac hx p/w episode of CP associated w/ SOB and diaphoresis 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 He presented to [facility], where he was noted to be diaphoretic and hypotensive with a BP 94/49 at triage. He later left without waiting room and came to this facility. Here, he has been normotensive. On my exam, he was resting comfortably with no complaints. EKG shows paced rhythm, With normalization of T wave inversions that were present in inferior and lateral leads on previous EKG from earlier tonight. Gave the patient aspirin and obtain repeat troponin which was normal. Discussed with cardiology, Dr. Eula Fried, given the patient's extensive cardiac history. He recommended transfer to [facility] for cardiology admission. Pt is in agreement. He will be transferred to [facility] for admission.   Sharlett Iles, MD 04/07/16 [phone]

## 2016-04-07 NOTE — ED Notes (Signed)
Attempted to call report.  RN not available. To call back in 15 min.

## 2016-04-07 NOTE — ED Notes (Signed)
Patient states he will be needing a dr. Note for work.

## 2016-04-07 NOTE — ED Notes (Signed)
Patient states he will be needing a note for work.

## 2016-04-08 ENCOUNTER — Observation Stay (HOSPITAL_COMMUNITY): Payer: Medicaid Other

## 2016-04-08 ENCOUNTER — Encounter (HOSPITAL_COMMUNITY): Payer: Self-pay | Admitting: Student

## 2016-04-08 ENCOUNTER — Observation Stay (HOSPITAL_BASED_OUTPATIENT_CLINIC_OR_DEPARTMENT_OTHER): Payer: Medicaid Other

## 2016-04-08 DIAGNOSIS — I129 Hypertensive chronic kidney disease with stage 1 through stage 4 chronic kidney disease, or unspecified chronic kidney disease: Secondary | ICD-10-CM | POA: Diagnosis not present

## 2016-04-08 DIAGNOSIS — R0602 Shortness of breath: Secondary | ICD-10-CM | POA: Diagnosis not present

## 2016-04-08 DIAGNOSIS — R079 Chest pain, unspecified: Secondary | ICD-10-CM

## 2016-04-08 DIAGNOSIS — N189 Chronic kidney disease, unspecified: Secondary | ICD-10-CM | POA: Diagnosis not present

## 2016-04-08 DIAGNOSIS — F1910 Other psychoactive substance abuse, uncomplicated: Secondary | ICD-10-CM | POA: Diagnosis not present

## 2016-04-08 DIAGNOSIS — I5022 Chronic systolic (congestive) heart failure: Secondary | ICD-10-CM | POA: Diagnosis not present

## 2016-04-08 DIAGNOSIS — R0789 Other chest pain: Secondary | ICD-10-CM | POA: Diagnosis not present

## 2016-04-08 HISTORY — DX: Other psychoactive substance abuse, uncomplicated: F19.10

## 2016-04-08 LAB — NM MYOCAR MULTI W/SPECT W/WALL MOTION / EF
CHL CUP MPHR: 165 {beats}/min
CHL CUP NUCLEAR SDS: 1
CHL CUP NUCLEAR SRS: 10
CHL CUP NUCLEAR SSS: 12
CSEPEW: 1 METS
LHR: 0.29
LV sys vol: 60 mL
LVDIAVOL: 120 mL (ref 62–150)
NUC STRESS TID: 1.14
Peak HR: 103 {beats}/min
Percent HR: 62 %
Rest HR: 68 {beats}/min

## 2016-04-08 LAB — TROPONIN I: Troponin I: <0.03 ng/mL (ref <0.031)

## 2016-04-08 LAB — LIPID PANEL
CHOLESTEROL: 171 mg/dL (ref 0–200)
HDL: 32 mg/dL — ABNORMAL LOW (ref >40)
LDL Cholesterol: 120 mg/dL — ABNORMAL HIGH (ref 0–99)
TRIGLYCERIDES: 94 mg/dL (ref <150)
Total CHOL/HDL Ratio: 5.3 RATIO
VLDL: 19 mg/dL (ref 0–40)

## 2016-04-08 LAB — CBC
HCT: 41 % (ref 39.0–52.0)
Hemoglobin: 13.2 g/dL (ref 13.0–17.0)
MCH: 26 pg (ref 26.0–34.0)
MCHC: 32.2 g/dL (ref 30.0–36.0)
MCV: 80.7 fL (ref 78.0–100.0)
Platelets: 252 10*3/uL (ref 150–400)
RBC: 5.08 MIL/uL (ref 4.22–5.81)
RDW: 15.3 % (ref 11.5–15.5)
WBC: 6.7 10*3/uL (ref 4.0–10.5)

## 2016-04-08 LAB — BASIC METABOLIC PANEL
Anion gap: 11 (ref 5–15)
BUN: 10 mg/dL (ref 6–20)
CALCIUM: 8.8 mg/dL — AB (ref 8.9–10.3)
CO2: 23 mmol/L (ref 22–32)
CREATININE: 1.45 mg/dL — AB (ref 0.61–1.24)
Chloride: 108 mmol/L (ref 101–111)
GFR calc Af Amer: >60 mL/min (ref >60)
GFR, EST NON AFRICAN AMERICAN: 53 mL/min — AB (ref >60)
Glucose, Bld: 117 mg/dL — ABNORMAL HIGH (ref 65–99)
POTASSIUM: 3.6 mmol/L (ref 3.5–5.1)
SODIUM: 142 mmol/L (ref 135–145)

## 2016-04-08 MED ORDER — TECHNETIUM TC 99M SESTAMIBI - CARDIOLITE
10.0000 | Freq: Once | INTRAVENOUS | Status: AC | PRN
  Administered 2016-04-08: 10 via INTRAVENOUS

## 2016-04-08 MED ORDER — TECHNETIUM TC 99M SESTAMIBI - CARDIOLITE
30.0000 | Freq: Once | INTRAVENOUS | Status: AC | PRN
  Administered 2016-04-08: 11:00:00 30 via INTRAVENOUS

## 2016-04-08 MED ORDER — REGADENOSON 0.4 MG/5ML IV SOLN
INTRAVENOUS | Status: AC
  Filled 2016-04-08: qty 5

## 2016-04-08 MED ORDER — ATORVASTATIN CALCIUM 20 MG PO TABS
20.0000 mg | ORAL_TABLET | Freq: Every day | ORAL | Status: DC
  Administered 2016-04-08: 20 mg via ORAL
  Filled 2016-04-08: qty 1

## 2016-04-08 MED ORDER — REGADENOSON 0.4 MG/5ML IV SOLN
0.4000 mg | Freq: Once | INTRAVENOUS | Status: AC
  Administered 2016-04-08: 0.4 mg via INTRAVENOUS
  Filled 2016-04-08: qty 5

## 2016-04-08 MED ORDER — ATORVASTATIN CALCIUM 20 MG PO TABS: 20.0000 mg | ORAL_TABLET | Freq: Every day | ORAL | Status: DC

## 2016-04-08 NOTE — [place] Notes (Signed)
     The patient was seen in nuclear medicine for a Lexiscan myoview. He tolerated the procedure well. No acute ST or TW changes on ECG.    Jettie Booze, NP

## 2016-04-08 NOTE — Progress Notes (Signed)
Hospital Problem List     Principal Problem:   Atypical chest pain Active Problems:   Chronic systolic CHF (congestive heart failure) (HCC)   NICM (nonischemic cardiomyopathy) (HCC)   CKD (chronic kidney disease) stage 3, GFR 30-59 ml/min    Patient Profile:   Primary Cardiologist: Dr. Haroldine Laws  55 y.o. male w/ PMH of nonischemic cardiomyopathy (EF 25-30%, s/p St. 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  Subjective   Reports mild discomfort overnight, still reproducible with palpation. Did have 8 beats NSVT. Talked with the patient about his positive UDS, says he "was at a party this weekend and did not realize he used the substance".   Inpatient Medications    . aspirin EC  81 mg Oral Daily  . carvedilol  25 mg Oral BID WC  . enoxaparin (LOVENOX) injection  40 mg Subcutaneous Q24H  . furosemide  20 mg Oral Daily  . hydrALAZINE  25 mg Oral TID  . pantoprazole  40 mg Oral Daily  . sacubitril-valsartan  1 tablet Oral BID  . spironolactone  25 mg Oral Daily    Vital Signs    Filed Vitals:   04/07/16 1358 04/07/16 1718 04/07/16 2040 04/08/16 0539  BP: 128/70 138/83 147/76 119/69  Pulse: 73 72 72 75  Temp: 98.4 F (36.9 C)  98 F (36.7 C) 98.1 F (36.7 C)  TempSrc: Oral  Oral Oral  Resp:   18 18  Height:      Weight:    230 lb (104.327 kg)  SpO2: 97%  99% 99%    Intake/Output Summary (Last 24 hours) at 04/08/16 0805 Last data filed at 04/08/16 0600  Gross per 24 hour  Intake    660 ml  Output    300 ml  Net    360 ml   Filed Weights   04/07/16 1002 04/08/16 0539  Weight: 230 lb (104.327 kg) 230 lb (104.327 kg)    Physical Exam    General: Well developed, well nourished, male appearing in no acute distress. Head: Normocephalic,  atraumatic.  Neck: Supple without bruits, JVD not elevated. Lungs:  Resp regular and unlabored, CTA without wheezing or rales. Heart: RRR, S1, S2, no S3, S4, or murmur; no rub. Abdomen: Soft, non-tender, non-distended with normoactive bowel sounds. No hepatomegaly. No rebound/guarding. No obvious abdominal masses. Extremities: No clubbing, cyanosis, or edema. Distal pedal pulses are 2+ bilaterally. Neuro: Alert and oriented X 3. Moves all extremities spontaneously. Psych: Normal affect.  Labs    CBC  Recent Labs  04/06/16 1500 04/08/16 0530  WBC 8.5 6.7  HGB 13.7 13.2  HCT 41.8 41.0  MCV 78.3 80.7  PLT 272 [id number]   Basic Metabolic Panel  Recent Labs  04/06/16 1500 04/08/16 0530  NA 139 142  K 3.8 3.6  CL 108 108  CO2 20* 23  GLUCOSE 99 117*  BUN 13 10  CREATININE 1.43* 1.45*  CALCIUM 9.1 8.8*   Cardiac Enzymes  Recent Labs  04/07/16 1154 04/07/16 1750 04/07/16 2341  TROPONINI <0.03 <0.03 <0.03   Fasting Lipid Panel  Recent Labs  04/08/16 0531  CHOL 171  HDL 32*  LDLCALC 120*  TRIG 94  CHOLHDL 5.3    Telemetry    V-paced HR in 70's. 8  beats NSVT.    Cardiac Studies and Radiology    Dg Chest 2 View: 04/06/2016  CLINICAL DATA:  Chest pain and shortness of breath beginning at 1:30 p.m. today. Initial encounter. EXAM: CHEST  2 VIEW COMPARISON:  PA and lateral chest 09/28/2015 and 04/03/2015. FINDINGS: The lungs are clear. No pneumothorax or pleural effusion. Heart size is normal. Pacing device remains in place. No focal bony abnormality. IMPRESSION: No acute disease. Electronically Signed   By: Inge Rise M.D.   On: 04/06/2016 15:22   ECHOCARDIOGRAM: 10/24/2014 Study Conclusions: - Left ventricle: The cavity size was moderately dilated. Systolic function was severely reduced. The estimated ejection fraction was in the range of 25% to 30%. Diffuse hypokinesis. There is akinesis of the anteroseptal and inferoseptal myocardium.  Doppler parameters are consistent with abnormal left ventricular relaxation (grade 1 diastolic dysfunction). - Mitral valve: There was mild regurgitation. - Left atrium: The atrium was moderately dilated.  Impressions: - Compared to the prior study, there has been no significant interval change.  Assessment & Plan    1. Atypical chest pain - reports having a shooting pain along his left pectoral region, radiating from his left nipple and only lasting for seconds with each episode. Pain is reproducible with palpation. Reports associated dyspnea which started yesterday. No exertional component noted. - his initial EKG on 04/06/2016 showed TWI in the inferolateral leads. In his most recent tracing, the TWI has resolved. - cyclic troponin values have been negative. - overall his pain sounds atypical. His last catheterization was in 2014 and showed no significant CAD. Underwent an exercise test in 07/2015 which "demonstrated a mild functional impairment when compared to matched sedentary norms. The major limitation to exercise appears to be the patient's obesity. There was mild chronotropic incompetence."  - scheduled for a [employer] today with his continued CP, transient EKG changes, and NSVT noted on telemetry. If low-risk, he can likely be discharged later today.  2. Nonischemic Cardiomyopathy/ Chronic systolic CHF - EF [id number] by echo in 09/2014, s/p St. Jude BiV 01/2015. Cath in 07/2013 showing no significant CAD. - does not appear volume overloaded on physical exam.  - continue Entresto, Lasix, and Spironolactone. Discontinue BB in setting Cocaine use.   3. CKD (chronic kidney disease) stage 3, GFR 30-59 ml/min - creatinine stable at 1.45 on 04/08/2016. - continue to monitor.  4. History of Substance Abuse - denies any recent cocaine use (reports last using 2+ years ago, although UDS was positive for opiates and cocaine 6 months ago).  - UDS this admission positive for  Cocaine. Will stop BB.  - had an extensive discussion with the patient regarding cocaine use and the effects on his heart.  5. HLD - LDL elevated to 120. - will initiate statin therapy.    Arna Medici , PA-C 8:05 AM 04/08/2016 Pager: [phone]   Attending Note:   The patient was seen and examined.  Agree with assessment and plan as noted above.  Changes made to the above note as needed.  Pt presented with CP Troponins are negative UDS is + for cocaine  CP is likely noncardiac.   Will get the myoview to be sure  I discussed the risks of continued usage of cocaine.   Told him that using it with a beta blocker can lead to more problems - even death. Beta blocker has been held today  He will need to go home on Coreg 25 bid.   Follow up with Dr.  Bensimhon.  Anticipate DC later today   Ramond Dial., MD, [facility] 04/08/2016, 9:07 AM [address],  [place] Pager [phone]

## 2016-04-08 NOTE — Discharge Summary (Signed)
Discharge Summary    Patient ID: Roger Crosby,  MRN: [id number], DOB/AGE: 1961/10/24 55 y.o.  Admit date: 04/06/2016 Discharge date: 04/08/2016  Primary Care Provider: Lance Bosch Primary Cardiologist: Dr. Haroldine Laws  Discharge Diagnoses    Principal Problem:   Atypical chest pain Active Problems:   Chronic systolic CHF (congestive heart failure) (HCC)   NICM (nonischemic cardiomyopathy) (HCC)   CKD (chronic kidney disease) stage 3, GFR 30-59 ml/min   Substance abuse   History of Present Illness     Roger Crosby is a 55 y.o. male with past medical history of nonischemic cardiomyopathy (EF 25-30%, s/p St. 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  The patient reported having a shooting pain along his left pectoral region for the past 3 weeks. Reported the discomfort can present at rest or with exertion and only lasts for several seconds. Denies any associated diaphoresis, nausea, vomiting or radiating pain. Says the pain seems to radiate from his left nipple, with his nipple being tender to palpation. Does report experiencing mild dyspnea yesterday with the pain and that is what prompted him to seek evaluation. He initially came to [facility] yesterday but left prior to being seen due to his pain resolving. His pain represented this AM and that is when he went to [facility]   Reported good compliance with his medications. Denied any recent cocaine use (reports last using 2+ years ago, UDS was positive for opiates and cocaine 6 months ago).   While in the ED, his initial two troponin values were negative. His initial EKG on 04/06/2016 showed TWI in the inferolateral leads. In his most recent tracing, the TWI had resolved.  Hospital Course     Consultants: None   The  patient denied any chest pain at the time of the initial encounter. With a recent clean cath in 07/2013 and his overall atypical symptoms, a nuclear stress test was favored over a repeat cath.  On 04/08/2016, he reported mild chest discomfort overnight, but the pain was reproducible with palpation. He did have 8 beats of NSVT noted on telemetry. His LDL was found to be elevated at 120 and he was started on statin therapy. The patient initially denied any recent Cocaine use but his UDS was positive during this admission. Upon this being readdressed with the patient, he reported last using this past Saturday. An extensive conversation about Cocaine use in the setting of his nonischemic cardiomyopathy and with BB use was held with the patient by both [place] (PA-C) and Dr. Acie Fredrickson. He will be restarted on his BB prior to discharge but continued use of this should be examined in the future if he continues to use Cocaine.   A nuclear stress test was performed and showed a small fixed anteroseptal LBBB artifact and a small mild fixed inferolateral attenuation artifact with no reversible ischemia noted.  He was last examined by Dr. Acie Fredrickson and deemed stable for discharge. He has an outpatient echocardiogram scheduled for next week and an office visit with Dr. Haroldine Laws on 04/29/2016. He was discharged home in stable condition.  Discharge Vitals Blood pressure 135/77, pulse 75, temperature 98.2 F (36.8 C), temperature source Oral, resp. rate 18, height 5\' 8"  (1.727 m), weight 230 lb (104.327 kg), SpO2 94 %.  Filed Weights   04/07/16 1002 04/08/16 0539  Weight: 230 lb (104.327 kg) 230 lb (104.327 kg)    Labs & Radiologic Studies     CBC  Recent Labs  04/06/16 1500 04/08/16 0530  WBC 8.5 6.7  HGB 13.7 13.2  HCT 41.8 41.0  MCV 78.3 80.7  PLT 272 [id number]   Basic Metabolic Panel  Recent Labs  04/06/16 1500 04/08/16 0530  NA 139 142  K 3.8 3.6  CL 108 108  CO2 20* 23  GLUCOSE 99 117*    BUN 13 10  CREATININE 1.43* 1.45*  CALCIUM 9.1 8.8*   Liver Function Tests No results for input(s): AST, ALT, ALKPHOS, BILITOT, PROT, ALBUMIN in the last 72 hours. No results for input(s): LIPASE, AMYLASE in the last 72 hours. Cardiac Enzymes  Recent Labs  04/07/16 1154 04/07/16 1750 04/07/16 2341  TROPONINI <0.03 <0.03 <0.03   Fasting Lipid Panel  Recent Labs  04/08/16 0531  CHOL 171  HDL 32*  LDLCALC 120*  TRIG 94  CHOLHDL 5.3    Dg Chest 2 View: 04/06/2016  CLINICAL DATA:  Chest pain and shortness of breath beginning at 1:30 p.m. today. Initial encounter. EXAM: CHEST  2 VIEW COMPARISON:  PA and lateral chest 09/28/2015 and 04/03/2015. FINDINGS: The lungs are clear. No pneumothorax or pleural effusion. Heart size is normal. Pacing device remains in place. No focal bony abnormality. IMPRESSION: No acute disease. Electronically Signed   By: Inge Rise M.D.   On: 04/06/2016 15:22   Nm Myocar Multi W/spect W/wall Motion / Ef: 04/08/2016   There was no ST segment deviation noted during stress.  Defect 1: There is a medium defect of moderate severity.  This is a low risk study.  Nuclear stress EF: 50%.  No T wave inversion was noted during stress.  Small, mild fixed anteroseptal LBBB artifact. Small mild fixed inferior/inferolateral attenuation artifact. No significant reversible ischemia. LVEF 50% with septal dyskinesis from LBBB. This is a low risk study.    Diagnostic Studies/Procedures    None  Disposition   Pt is being discharged home today in good condition.  Follow-up Plans & Appointments    Follow-up Information    Follow up with [facility] On 04/17/2016.   Specialty:  Cardiology   Why:  Echocardiogram at 3:00PM.   Contact information:   [address], [place] [phone]      Follow up with Glori Bickers, MD On 04/29/2016.   Specialty:  Cardiology   Why:  Cardiology Follow-Up on  04/29/2016 at 2:20PM.   Contact information:   [address] [place] [address] [phone]      Discharge Instructions    Diet - low sodium heart healthy    Complete by:  As directed      Increase activity slowly    Complete by:  As directed            Discharge Medications   Current Discharge Medication List    START taking these medications   Details  atorvastatin (LIPITOR) 20 MG tablet Take 1 tablet (20 mg total) by mouth daily at 6 PM. Qty: 30 tablet, Refills: 6      CONTINUE these medications which have NOT CHANGED   Details  aspirin EC 81 MG tablet Take 1 tablet (81 mg total) by mouth daily.   Associated Diagnoses: Secondary cardiomyopathy, unspecified; Coronary atherosclerosis of native coronary artery; Essential hypertension, benign  carvedilol (COREG) 25 MG tablet TAKE 1 TABLET (25 MG TOTAL) BY MOUTH 2 (TWO) TIMES DAILY WITH A MEAL. Qty: 60 tablet, Refills: 3    furosemide (LASIX) 20 MG tablet Take 1 tablet (20 mg total) by mouth daily. Qty: 30 tablet, Refills: 6    hydrALAZINE (APRESOLINE) 25 MG tablet Take 1 tablet (25 mg total) by mouth 3 (three) times daily. Qty: 90 tablet, Refills: 3    omeprazole (PRILOSEC) 20 MG capsule Take 1 capsule (20 mg total) by mouth daily. Qty: 30 capsule, Refills: 6    sacubitril-valsartan (ENTRESTO) 97-103 MG Take 1 tablet by mouth 2 (two) times daily. Qty: 60 tablet, Refills: 6    spironolactone (ALDACTONE) 25 MG tablet TAKE 1 TABLET (25 MG TOTAL) BY MOUTH DAILY. Qty: 30 tablet, Refills: 6         Allergies No Known Allergies   Outstanding Labs/Studies   None  Duration of Discharge Encounter   Greater than 30 minutes including physician time.  Signed, Erma Heritage, PA-C 04/08/2016, 3:48 PM   Attending Note:   The patient was seen and examined.  Agree with assessment and plan as noted above.  Changes made to the above note as needed.  Pt was admitted with atypical  CP myoview was low risk UDS is + for cocaine  I discussed the risk of continued cocaine use with him - specifically the possible interaction of cocaine and beta blockers.   He will follow up with CHF clinic   Thayer Headings, Brooke Bonito., MD, [facility] 04/08/2016, 7:15 PM [address],  [place] Pager [phone]

## 2016-04-12 ENCOUNTER — Encounter (HOSPITAL_COMMUNITY): Payer: Self-pay | Admitting: *Deleted

## 2016-04-12 ENCOUNTER — Emergency Department (HOSPITAL_COMMUNITY)
Admission: EM | Admit: 2016-04-12 | Discharge: 2016-04-12 | Disposition: A | Discharge status: Home / Self Care | Payer: Medicaid Other | Attending: Emergency Medicine | Admitting: Emergency Medicine

## 2016-04-12 DIAGNOSIS — E7800 Pure hypercholesterolemia, unspecified: Secondary | ICD-10-CM | POA: Insufficient documentation

## 2016-04-12 DIAGNOSIS — F17210 Nicotine dependence, cigarettes, uncomplicated: Secondary | ICD-10-CM | POA: Diagnosis not present

## 2016-04-12 DIAGNOSIS — Z9889 Other specified postprocedural states: Secondary | ICD-10-CM | POA: Diagnosis not present

## 2016-04-12 DIAGNOSIS — Z8739 Personal history of other diseases of the musculoskeletal system and connective tissue: Secondary | ICD-10-CM | POA: Insufficient documentation

## 2016-04-12 DIAGNOSIS — K219 Gastro-esophageal reflux disease without esophagitis: Secondary | ICD-10-CM | POA: Insufficient documentation

## 2016-04-12 DIAGNOSIS — R55 Syncope and collapse: Secondary | ICD-10-CM | POA: Insufficient documentation

## 2016-04-12 DIAGNOSIS — I2510 Atherosclerotic heart disease of native coronary artery without angina pectoris: Secondary | ICD-10-CM | POA: Diagnosis not present

## 2016-04-12 DIAGNOSIS — Z95810 Presence of automatic (implantable) cardiac defibrillator: Secondary | ICD-10-CM | POA: Insufficient documentation

## 2016-04-12 DIAGNOSIS — Z79899 Other long term (current) drug therapy: Secondary | ICD-10-CM | POA: Diagnosis not present

## 2016-04-12 DIAGNOSIS — I129 Hypertensive chronic kidney disease with stage 1 through stage 4 chronic kidney disease, or unspecified chronic kidney disease: Secondary | ICD-10-CM | POA: Diagnosis not present

## 2016-04-12 DIAGNOSIS — N189 Chronic kidney disease, unspecified: Secondary | ICD-10-CM | POA: Insufficient documentation

## 2016-04-12 DIAGNOSIS — I5020 Unspecified systolic (congestive) heart failure: Secondary | ICD-10-CM | POA: Insufficient documentation

## 2016-04-12 DIAGNOSIS — Z7982 Long term (current) use of aspirin: Secondary | ICD-10-CM | POA: Diagnosis not present

## 2016-04-12 LAB — COMPREHENSIVE METABOLIC PANEL
ALBUMIN: 3.2 g/dL — AB (ref 3.5–5.0)
ALT: 23 U/L (ref 17–63)
ANION GAP: 8 (ref 5–15)
AST: 20 U/L (ref 15–41)
Alkaline Phosphatase: 63 U/L (ref 38–126)
BUN: 9 mg/dL (ref 6–20)
CO2: 23 mmol/L (ref 22–32)
Calcium: 8.5 mg/dL — ABNORMAL LOW (ref 8.9–10.3)
Chloride: 108 mmol/L (ref 101–111)
Creatinine, Ser: 1.58 mg/dL — ABNORMAL HIGH (ref 0.61–1.24)
GFR calc Af Amer: 55 mL/min — ABNORMAL LOW (ref >60)
GFR calc non Af Amer: 48 mL/min — ABNORMAL LOW (ref >60)
GLUCOSE: 113 mg/dL — AB (ref 65–99)
POTASSIUM: 3.7 mmol/L (ref 3.5–5.1)
SODIUM: 139 mmol/L (ref 135–145)
TOTAL PROTEIN: 5.9 g/dL — AB (ref 6.5–8.1)
Total Bilirubin: 0.6 mg/dL (ref 0.3–1.2)

## 2016-04-12 LAB — CBC WITH DIFFERENTIAL/PLATELET
BASOS ABS: 0 10*3/uL (ref 0.0–0.1)
BASOS PCT: 0 %
EOS ABS: 0.1 10*3/uL (ref 0.0–0.7)
Eosinophils Relative: 1 %
HEMATOCRIT: 42.4 % (ref 39.0–52.0)
HEMOGLOBIN: 13.5 g/dL (ref 13.0–17.0)
Lymphocytes Relative: 13 %
Lymphs Abs: 1.4 10*3/uL (ref 0.7–4.0)
MCH: 25.8 pg — ABNORMAL LOW (ref 26.0–34.0)
MCHC: 31.8 g/dL (ref 30.0–36.0)
MCV: 80.9 fL (ref 78.0–100.0)
MONO ABS: 0.8 10*3/uL (ref 0.1–1.0)
Monocytes Relative: 7 %
NEUTROS ABS: 8.9 10*3/uL — AB (ref 1.7–7.7)
Neutrophils Relative %: 79 %
Platelets: 230 10*3/uL (ref 150–400)
RBC: 5.24 MIL/uL (ref 4.22–5.81)
RDW: 14.8 % (ref 11.5–15.5)
WBC: 11.3 10*3/uL — ABNORMAL HIGH (ref 4.0–10.5)

## 2016-04-12 LAB — I-STAT TROPONIN, ED: Troponin i, poc: 0.01 ng/mL (ref 0.00–0.08)

## 2016-04-12 MED ORDER — SODIUM CHLORIDE 0.9 % IV SOLN
INTRAVENOUS | Status: DC
  Administered 2016-04-12: 17:00:00 via INTRAVENOUS

## 2016-04-12 MED ORDER — SODIUM CHLORIDE 0.9 % IV BOLUS (SEPSIS): 1000.0000 mL | Freq: Once | INTRAVENOUS | Status: DC

## 2016-04-12 NOTE — ED Provider Notes (Addendum)
CSN: [id number]     Arrival date & time 04/12/16  1407 History   First MD Initiated Contact with Patient 04/12/16 1518     Chief Complaint  Patient presents with  . Loss of Consciousness     (Consider location/radiation/quality/duration/timing/severity/associated sxs/prior Treatment)  HPI  Blood pressure 111/63, pulse 77, temperature 97.5 F (36.4 C), temperature source Oral, resp. rate 20, height 5\' 8"  (1.727 m), weight 104.327 kg, SpO2 98 %.  Jainil Dacosta is a 55 y.o. male PMH significant for AICD, NICM, CAD, LBBB, CKD, sCHF, GERD brought in by EMS for syncopal event with hypotension. Patient states he was on his porch, he had a normal morning eating and drinking normally he had a beer when he went to stand up he felt like "something came over him" patient felt that porch, this was unwitnessed. He denies headache, anticoagulation, nausea, vomiting, cervicalgia, prodrome of chest pain or shortness of breath, he's been eating and drinking normally he denies melena, hematochezia, nausea, vomiting, fever, cough. He has been compliant with his many high blood pressure medications, states he started   Past Medical History  Diagnosis Date  . HTN (hypertension)   . GERD (gastroesophageal reflux disease)   . LBP (low back pain)   . LBBB (left bundle branch block)   . Hx of echocardiogram     echo 5/11: EF 45-50%, mild HK of Ap AS wall, ap Ant wall and true apex (LAD distribution), mild LAE  . NICM (nonischemic cardiomyopathy) ([place])   . CAD (coronary artery disease)     a. nonobstructive by LHC 6/11: oOM1 40%, mild luminal irregs elsewhere b. cath 09/2014L no obstructive disease c. 03/2016: Low-risk NST  . Systolic heart failure ([place])   . AICD (automatic cardioverter/defibrillator) present     a.  Unsuccessful LV lead placement 11/2014  . High cholesterol   . CKD (chronic kidney disease)    Past Surgical History  Procedure Laterality Date  . Left heart catheterization with coronary  angiogram N/A 08/04/2013    Procedure: LEFT HEART CATHETERIZATION WITH CORONARY ANGIOGRAM;  Surgeon: Larey Dresser, MD;  Location: [facility] CATH LAB;  Service: Cardiovascular;  Laterality: N/A;  . Cardiac catheterization Left 07/2013    with angiogram  . Bi-ventricular implantable cardioverter defibrillator N/A 12/14/2014    with failed LV lead placeemnt  . Thoracotomy Left 02/07/2015    Procedure: THORACOTOMY MAJOR;  Surgeon: Gaye Pollack, MD;  Location: [facility] OR;  Service: Thoracic;  Laterality: Left;  . Epicardial pacing lead placement N/A 02/07/2015    eicardial LV lead placed by Dr Laverta Baltimore  . Tendon repair Left 09/20/2015    Procedure: repair nerve tendon wrist;  Surgeon: Dayna Barker, MD;  Location: [place];  Service: Plastics;  Laterality: Left;  . Debridement and closure wound Right 09/20/2015    Procedure: DEBRIDEMENT AND CLOSURE WOUND;  Surgeon: Dayna Barker, MD;  Location: [place];  Service: Plastics;  Laterality: Right;   Family History  Problem Relation Age of Onset  . Heart attack Mother     in 84's  . Heart disease Mother     ischemic  . Heart attack Father     in 69's  . Heart disease Father     ischemic  . Congestive Heart Failure Mother   . Diabetes Mother   . Hypertension Mother   . Heart Problems Father   . Crohn's disease Sister   . Prostate cancer Brother   . HIV/AIDS Brother    Social History  Substance Use Topics  . Smoking status: Current Every Day Smoker -- 0.12 packs/day for 25 years    Types: Cigarettes  . Smokeless tobacco: Never Used  . Alcohol Use: Yes     Comment: social    Review of Systems  10 systems reviewed and found to be negative, except as noted in the HPI.   Allergies  Review of patient's allergies indicates no known allergies.  Home Medications   Prior to Admission medications   Medication Sig Start Date End Date Taking? Authorizing Provider  aspirin EC 81 MG tablet Take 1 tablet (81 mg total) by mouth daily. 08/29/12  Yes Scott T  Kathlen Mody, PA-C  atorvastatin (LIPITOR) 20 MG tablet Take 1 tablet (20 mg total) by mouth daily at 6 PM. Patient taking differently: Take 20 mg by mouth every morning.  04/08/16  Yes Erma Heritage, PA  carvedilol (COREG) 25 MG tablet TAKE 1 TABLET (25 MG TOTAL) BY MOUTH 2 (TWO) TIMES DAILY WITH A MEAL. 03/23/16  Yes Shaune Pascal Bensimhon, MD  diphenhydramine-acetaminophen (TYLENOL PM) 25-500 MG TABS tablet Take 1 tablet by mouth at bedtime as needed.   Yes Historical Provider, MD  furosemide (LASIX) 20 MG tablet Take 1 tablet (20 mg total) by mouth daily. 12/27/14  Yes Jolaine Artist, MD  hydrALAZINE (APRESOLINE) 25 MG tablet Take 1 tablet (25 mg total) by mouth 3 (three) times daily. 01/23/16  Yes Jolaine Artist, MD  omeprazole (PRILOSEC) 20 MG capsule Take 1 capsule (20 mg total) by mouth daily. 09/19/15  Yes Shaune Pascal Bensimhon, MD  sacubitril-valsartan (ENTRESTO) 97-103 MG Take 1 tablet by mouth 2 (two) times daily. 12/30/15  Yes Jolaine Artist, MD  spironolactone (ALDACTONE) 25 MG tablet TAKE 1 TABLET (25 MG TOTAL) BY MOUTH DAILY. 12/30/15  Yes Shaune Pascal Bensimhon, MD   BP 115/71 mmHg  Pulse 65  Temp(Src) 97.5 F (36.4 C) (Oral)  Resp 18  Ht 5\' 8"  (1.727 m)  Wt 104.327 kg  BMI 34.98 kg/m2  SpO2 95% Physical Exam  Constitutional: He is oriented to person, place, and time. He appears well-developed and well-nourished. No distress.  HENT:  Head: Normocephalic.  Mouth/Throat: Oropharynx is clear and moist.  Eyes: Conjunctivae are normal.  Neck: Normal range of motion. No JVD present. No tracheal deviation present.  Cardiovascular: Normal rate, regular rhythm and intact distal pulses.   Radial pulse equal bilaterally  Pulmonary/Chest: Effort normal and breath sounds normal. No stridor. No respiratory distress. He has no wheezes. He has no rales. He exhibits no tenderness.  Abdominal: Soft. He exhibits no distension and no mass. There is no tenderness. There is no rebound and no  guarding.  Musculoskeletal: Normal range of motion. He exhibits no edema or tenderness.  No calf asymmetry, superficial collaterals, palpable cords, edema, Homans sign negative bilaterally.    Neurological: He is alert and oriented to person, place, and time.  Skin: Skin is warm. He is not diaphoretic.  Psychiatric: He has a normal mood and affect.  Nursing note and vitals reviewed.   ED Course  Procedures (including critical care time) Labs Review Labs Reviewed  CBC WITH DIFFERENTIAL/PLATELET - Abnormal; Notable for the following:    WBC 11.3 (*)    MCH 25.8 (*)    Neutro Abs 8.9 (*)    All other components within normal limits  COMPREHENSIVE METABOLIC PANEL - Abnormal; Notable for the following:    Glucose, Bld 113 (*)    Creatinine, Ser 1.58 (*)  Calcium 8.5 (*)    Total Protein 5.9 (*)    Albumin 3.2 (*)    GFR calc non Af Amer 48 (*)    GFR calc Af Amer 55 (*)    All other components within normal limits  I-STAT TROPOININ, ED    Imaging Review No results found. I have personally reviewed and evaluated these images and lab results as part of my medical decision-making.   EKG Interpretation   Date/Time:  Sunday Apr 12 2016 14:22:20 EDT Ventricular Rate:  71 PR Interval:  149 QRS Duration: 121 QT Interval:  389 QTC Calculation: 423 R Axis:   2 Text Interpretation:  Sinus rhythm LVH with secondary repolarization  abnormality Anterior Q waves, possibly due to LVH agree. no sig change  from old Confirmed by Johnney Killian, MD, [place] [phone]) on 04/12/2016 3:07:41 PM      MDM   Final diagnoses:  Syncope and collapse    Filed Vitals:   04/12/16 1500 04/12/16 1600 04/12/16 1630 04/12/16 1700  BP: 111/63 101/65 119/77 115/71  Pulse: 77 64 68 65  Temp:      TempSrc:      Resp: 20 11 18 18   Height:      Weight:      SpO2: 98% 93% 92% 95%    Medications  0.9 %  sodium chloride infusion ( Intravenous New Bag/Given 04/12/16 1647)    Antrell Abraha is 55 y.o.  male presenting with Syncope from standing. Patient had prodrome, there was no associated chest pain. Patient was drinking alcohol at the time. There was no tonic-clonic movement or postictal confusion consistent with seizure. No objective signs of head trauma and patient's neuro exam is nonfocal. Patient is on multiple antihypertensive medication in eating to diuretics. EKG with no arrhythmia, blood work reassuring, mild leukocytosis, patient's creatinine is at his baseline. Patient gently hydrated in the ED, discussed case with attending physician who recommends hold lasix 2 days, apresoline change to BID instead of TID. Patient will follow with his cardiologist this week.  Evaluation does not show pathology that would require ongoing emergent intervention or inpatient treatment. Pt is hemodynamically stable and mentating appropriately. Discussed findings and plan with patient/guardian, who agrees with care plan. All questions answered. Return precautions discussed and outpatient follow up given.      Monico Blitz, PA-C 04/12/16 1733  Charlesetta Shanks, MD 04/18/16 1511  Monico Blitz, PA-C 05/04/16 1548  Charlesetta Shanks, MD 05/04/16 1754

## 2016-04-12 NOTE — ED Notes (Signed)
PT from home with [facility].  Pt states she felt weak at home and sat on the ground. Witnessed LOC 2-5 mins. Denies CP, SOB.  Pt family denies fall or hitting head.    [facility] states diaphoretic on scene: BP 86/54, 96%, No palpable pulse.  16 g L AC, 1056ml Bolus, Pt pacemaker demand pacing.

## 2016-04-12 NOTE — Discharge Instructions (Signed)
Do not take your Lasix for 2 days, for the next 2 days switch to take your alpresolineonly twice a day instead of 3 times a day.  Please make an appointment with your cardiologist as soon as possible, they will clear you to return your normal medications.   Push fluids and go slow when you are standing up. Do not hesitate to return to the emergency room for any new, worsening or concerning symptoms.   Syncope Syncope is a medical term for fainting or passing out. This means you lose consciousness and drop to the ground. People are generally unconscious for less than 5 minutes. You may have some muscle twitches for up to 15 seconds before waking up and returning to normal. Syncope occurs more often in older adults, but it can happen to anyone. While most causes of syncope are not dangerous, syncope can be a sign of a serious medical problem. It is important to seek medical care.  CAUSES  Syncope is caused by a sudden drop in blood flow to the brain. The specific cause is often not determined. Factors that can bring on syncope include:  Taking medicines that lower blood pressure.  Sudden changes in posture, such as standing up quickly.  Taking more medicine than prescribed.  Standing in one place for too long.  Seizure disorders.  Dehydration and excessive exposure to heat.  Low blood sugar (hypoglycemia).  Straining to have a bowel movement.  Heart disease, irregular heartbeat, or other circulatory problems.  Fear, emotional distress, seeing blood, or severe pain. SYMPTOMS  Right before fainting, you may:  Feel dizzy or light-headed.  Feel nauseous.  See all white or all black in your field of vision.  Have cold, clammy skin. DIAGNOSIS  Your health care provider will ask about your symptoms, perform a physical exam, and perform an electrocardiogram (ECG) to record the electrical activity of your heart. Your health care provider may also perform other heart or blood tests to  determine the cause of your syncope which may include:  Transthoracic echocardiogram (TTE). During echocardiography, sound waves are used to evaluate how blood flows through your heart.  Transesophageal echocardiogram (TEE).  Cardiac monitoring. This allows your health care provider to monitor your heart rate and rhythm in real time.  Holter monitor. This is a portable device that records your heartbeat and can help diagnose heart arrhythmias. It allows your health care provider to track your heart activity for several days, if needed.  Stress tests by exercise or by giving medicine that makes the heart beat faster. TREATMENT  In most cases, no treatment is needed. Depending on the cause of your syncope, your health care provider may recommend changing or stopping some of your medicines. HOME CARE INSTRUCTIONS  Have someone stay with you until you feel stable.  Do not drive, use machinery, or play sports until your health care provider says it is okay.  Keep all follow-up appointments as directed by your health care provider.  Lie down right away if you start feeling like you might faint. Breathe deeply and steadily. Wait until all the symptoms have passed.  Drink enough fluids to keep your urine clear or pale yellow.  If you are taking blood pressure or heart medicine, get up slowly and take several minutes to sit and then stand. This can reduce dizziness. SEEK IMMEDIATE MEDICAL CARE IF:   You have a severe headache.  You have unusual pain in the chest, abdomen, or back.  You are bleeding from  your mouth or rectum, or you have black or tarry stool.  You have an irregular or very fast heartbeat.  You have pain with breathing.  You have repeated fainting or seizure-like jerking during an episode.  You faint when sitting or lying down.  You have confusion.  You have trouble walking.  You have severe weakness.  You have vision problems. If you fainted, call your local  emergency services (911 in [place] Do not drive yourself to the hospital.    This information is not intended to replace advice given to you by your health care provider. Make sure you discuss any questions you have with your health care provider.   Document Released: 11/16/2005 Document Revised: 04/02/2015 Document Reviewed: 01/15/2012 Elsevier Interactive Patient Education [employer].

## 2016-04-13 IMAGING — CR DG CHEST 2V
2 series · 2 of 2 positions shown · non-contrast
Comparison: December 21, 2013

CLINICAL DATA: Shortness of breath

EXAM:
CHEST  2 VIEW

[w chest pa]
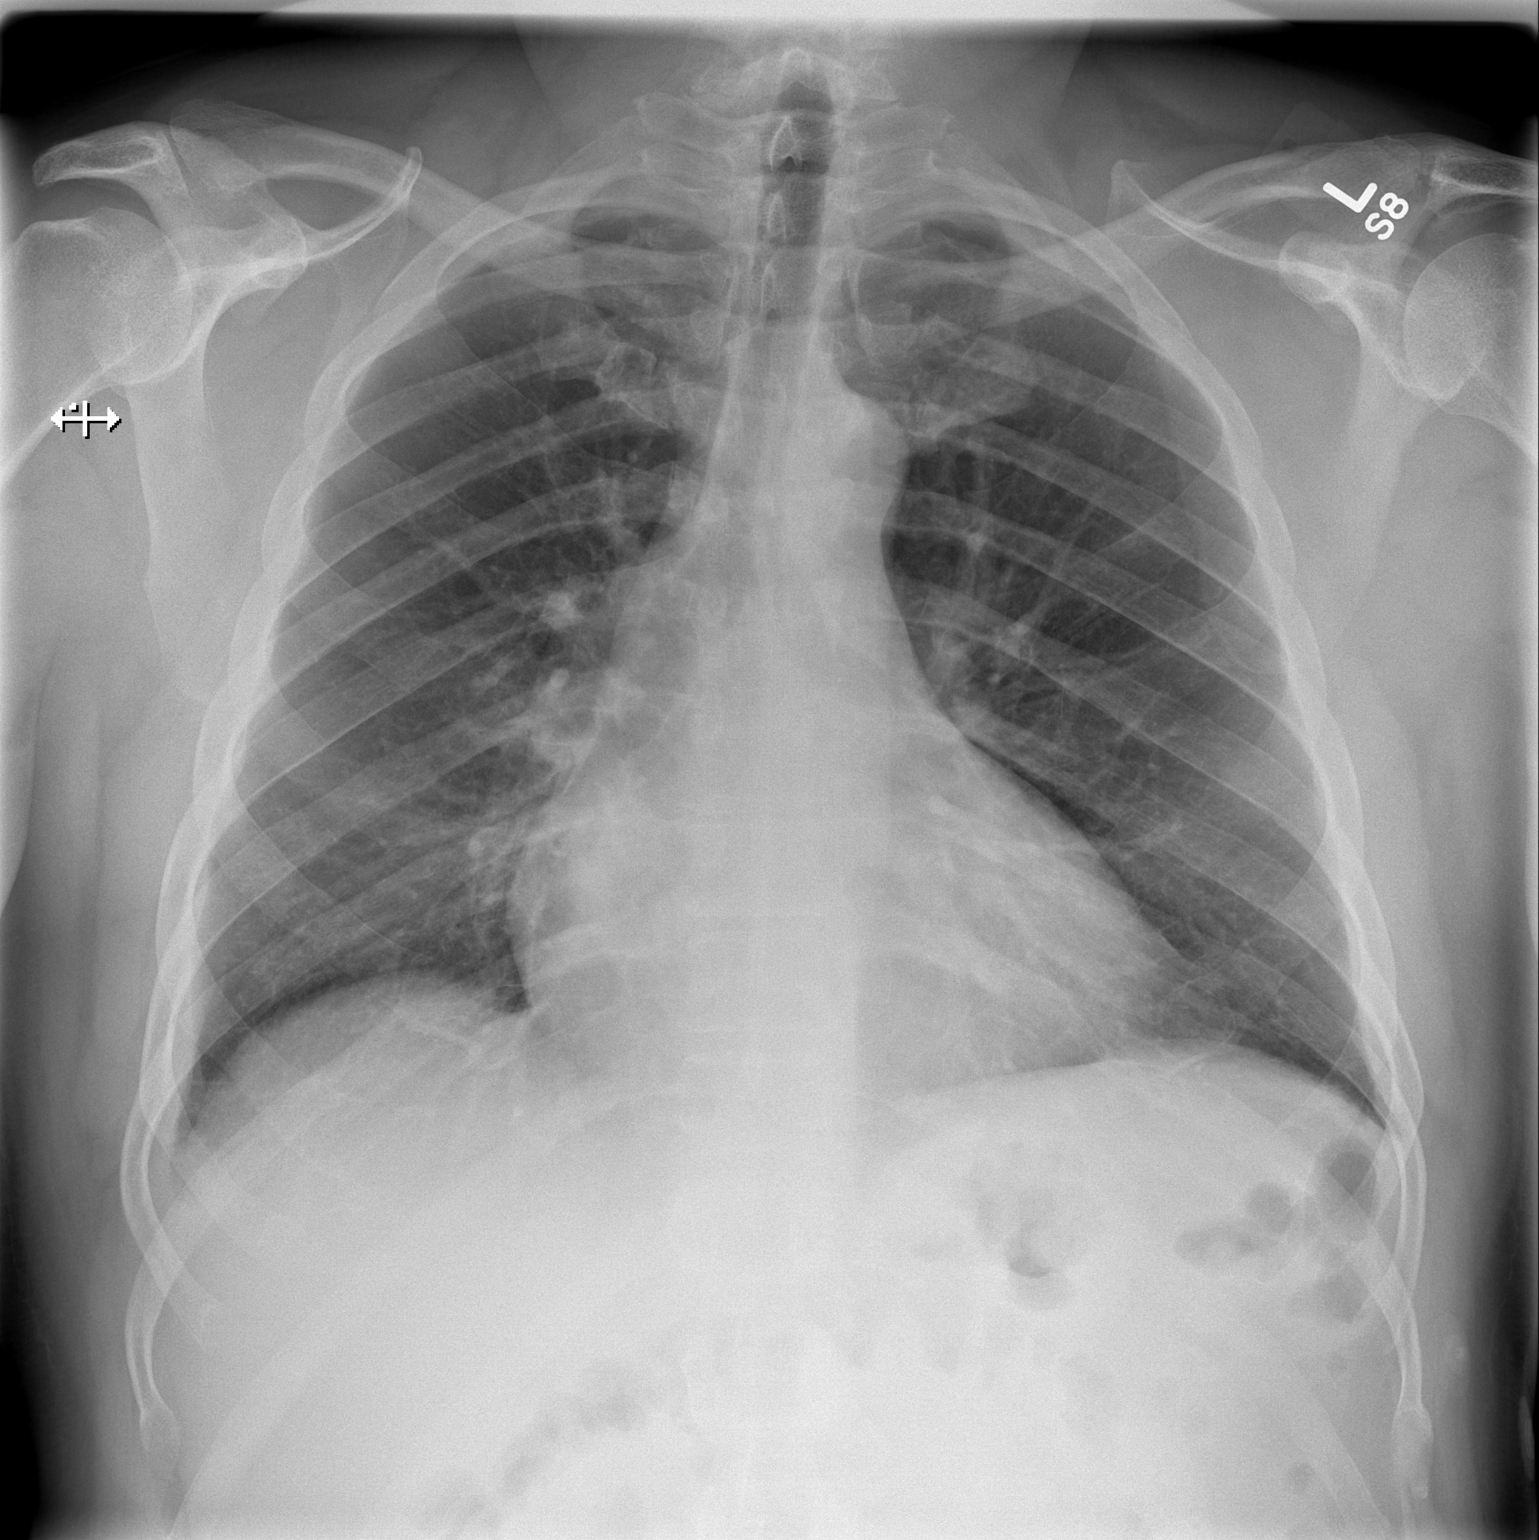

[w chest lat]
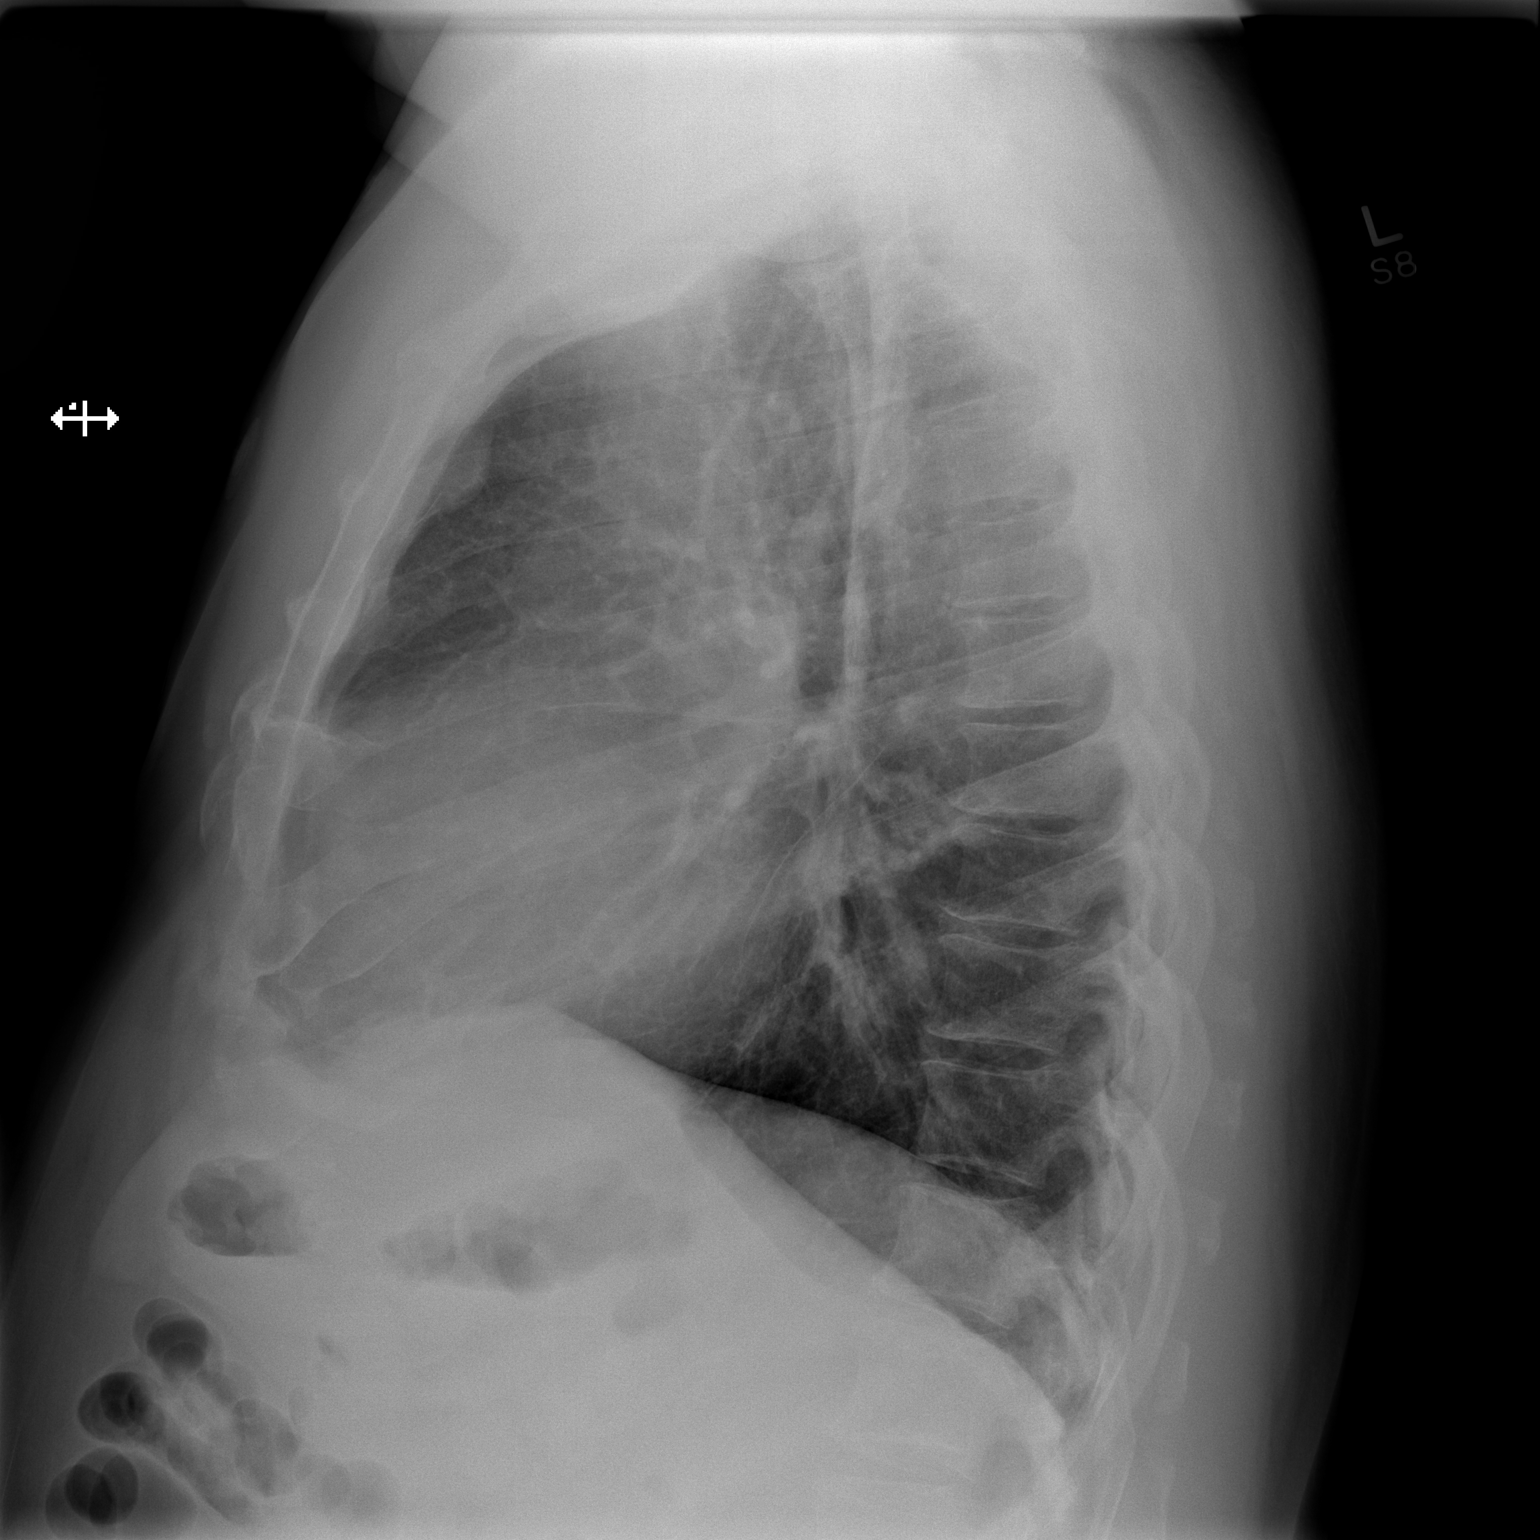

[2 of 2 positions shown; findings below may reference images not displayed]

FINDINGS: The heart size and mediastinal contours are within normal limits.
There is no focal infiltrate, pulmonary edema, or pleural effusion.
The visualized skeletal structures are unremarkable.
IMPRESSION: No active cardiopulmonary disease.

## 2016-04-14 ENCOUNTER — Encounter (HOSPITAL_BASED_OUTPATIENT_CLINIC_OR_DEPARTMENT_OTHER): Payer: Medicaid Other

## 2016-04-17 ENCOUNTER — Other Ambulatory Visit (HOSPITAL_COMMUNITY): Payer: Medicaid Other

## 2016-04-20 ENCOUNTER — Telehealth (HOSPITAL_COMMUNITY): Payer: Self-pay | Admitting: Pharmacist

## 2016-04-20 NOTE — Telephone Encounter (Signed)
Entresto 97-103 mg PA approved by [place] Medicaid from 04/09/16 through 04/04/17.   Ruta Hinds. Velva Harman, PharmD, BCPS, CPP Clinical Pharmacist Pager: [phone] Phone: [phone] 04/20/2016 12:00 PM

## 2016-04-24 ENCOUNTER — Other Ambulatory Visit (HOSPITAL_COMMUNITY): Payer: Medicaid Other

## 2016-04-29 ENCOUNTER — Inpatient Hospital Stay (HOSPITAL_COMMUNITY): Admission: RE | Admit: 2016-04-29 | Payer: Medicaid Other | Source: Ambulatory Visit | Admitting: Internal Medicine

## 2016-05-04 ENCOUNTER — Encounter (HOSPITAL_COMMUNITY): Payer: Self-pay | Admitting: *Deleted

## 2016-05-21 DIAGNOSIS — S5412XA Injury of median nerve at forearm level, left arm, initial encounter: Secondary | ICD-10-CM | POA: Insufficient documentation

## 2016-06-03 ENCOUNTER — Encounter (HOSPITAL_BASED_OUTPATIENT_CLINIC_OR_DEPARTMENT_OTHER): Payer: Medicaid Other

## 2016-06-04 ENCOUNTER — Telehealth: Payer: Self-pay | Admitting: Cardiology

## 2016-06-04 NOTE — Telephone Encounter (Signed)
Walk in pt form-patient needs info about pacemakers-gave to [employer]

## 2016-06-09 ENCOUNTER — Emergency Department (HOSPITAL_COMMUNITY): Payer: Medicaid Other

## 2016-06-09 ENCOUNTER — Emergency Department (HOSPITAL_COMMUNITY)
Admission: EM | Admit: 2016-06-09 | Discharge: 2016-06-09 | Discharge status: Against Medical Advice | Payer: Medicaid Other | Attending: Emergency Medicine | Admitting: Emergency Medicine

## 2016-06-09 ENCOUNTER — Encounter (HOSPITAL_COMMUNITY): Payer: Self-pay

## 2016-06-09 DIAGNOSIS — I5020 Unspecified systolic (congestive) heart failure: Secondary | ICD-10-CM | POA: Insufficient documentation

## 2016-06-09 DIAGNOSIS — I130 Hypertensive heart and chronic kidney disease with heart failure and stage 1 through stage 4 chronic kidney disease, or unspecified chronic kidney disease: Secondary | ICD-10-CM | POA: Diagnosis not present

## 2016-06-09 DIAGNOSIS — N189 Chronic kidney disease, unspecified: Secondary | ICD-10-CM | POA: Insufficient documentation

## 2016-06-09 DIAGNOSIS — I2510 Atherosclerotic heart disease of native coronary artery without angina pectoris: Secondary | ICD-10-CM | POA: Diagnosis not present

## 2016-06-09 DIAGNOSIS — Z7982 Long term (current) use of aspirin: Secondary | ICD-10-CM | POA: Insufficient documentation

## 2016-06-09 DIAGNOSIS — Z95810 Presence of automatic (implantable) cardiac defibrillator: Secondary | ICD-10-CM | POA: Insufficient documentation

## 2016-06-09 DIAGNOSIS — R079 Chest pain, unspecified: Secondary | ICD-10-CM | POA: Diagnosis not present

## 2016-06-09 DIAGNOSIS — F17210 Nicotine dependence, cigarettes, uncomplicated: Secondary | ICD-10-CM | POA: Diagnosis not present

## 2016-06-09 LAB — BASIC METABOLIC PANEL
ANION GAP: 11 (ref 5–15)
BUN: 10 mg/dL (ref 6–20)
CHLORIDE: 108 mmol/L (ref 101–111)
CO2: 21 mmol/L — ABNORMAL LOW (ref 22–32)
Calcium: 9.2 mg/dL (ref 8.9–10.3)
Creatinine, Ser: 1.59 mg/dL — ABNORMAL HIGH (ref 0.61–1.24)
GFR calc Af Amer: 55 mL/min — ABNORMAL LOW (ref >60)
GFR, EST NON AFRICAN AMERICAN: 47 mL/min — AB (ref >60)
GLUCOSE: 122 mg/dL — AB (ref 65–99)
POTASSIUM: 3.4 mmol/L — AB (ref 3.5–5.1)
Sodium: 140 mmol/L (ref 135–145)

## 2016-06-09 LAB — I-STAT TROPONIN, ED: Troponin i, poc: 0.01 ng/mL (ref 0.00–0.08)

## 2016-06-09 LAB — CBC
HEMATOCRIT: 47.4 % (ref 39.0–52.0)
HEMOGLOBIN: 15.6 g/dL (ref 13.0–17.0)
MCH: 26.5 pg (ref 26.0–34.0)
MCHC: 32.9 g/dL (ref 30.0–36.0)
MCV: 80.5 fL (ref 78.0–100.0)
Platelets: 287 10*3/uL (ref 150–400)
RBC: 5.89 MIL/uL — ABNORMAL HIGH (ref 4.22–5.81)
RDW: 14.9 % (ref 11.5–15.5)
WBC: 15.2 10*3/uL — AB (ref 4.0–10.5)

## 2016-06-09 MED ORDER — GI COCKTAIL ~~LOC~~
30.0000 mL | Freq: Once | ORAL | Status: DC
  Filled 2016-06-09: qty 30

## 2016-06-09 MED ORDER — ASPIRIN 81 MG PO CHEW
324.0000 mg | CHEWABLE_TABLET | Freq: Once | ORAL | Status: DC
  Filled 2016-06-09: qty 4

## 2016-06-09 NOTE — ED Notes (Signed)
Pt reports he started having upper central burning chest pain last night after laying down s/p spaghetti.  Pt reports he "burped" just before this RN entered the room and his pain decreased.

## 2016-06-09 NOTE — ED Notes (Signed)
Patient here with sharp chest pain since early am. States he went to [facility] ED this am and left prior to being seen. States he has been diaphoretic with same, was to be in court his am and requesting note for excuse. NAD. Pain worse with inspiration.

## 2016-06-09 NOTE — ED Provider Notes (Signed)
CSN: [id number]     Arrival date & time 06/09/16  0900 History   First MD Initiated Contact with Patient 06/09/16 [phone]     Chief Complaint  Patient presents with  . Chest Pain   HPI Patient presents to the emergency room for evaluation of chest pain. Patient states his symptoms started about 4 hours ago. He woke up at about 4 AM with a burning type sensation in his upper chest. He wonders if it could be acid reflux since he ate some spaghetti last evening. He's had trouble with acid reflux in the past but it has not felt exactly like this. Patient denies any shortness of breath. No fevers. No nausea or vomiting. No abdominal pain. No back pain. While he has been here this am, he burped and is now feeling a lot better.  Patient does have a history of congestive heart failure and nonischemic cardiomyopathy. He has a history of nonobstructive coronary artery disease. He had a cardiac cath last in September 2014 and a low risk NST in April of this year.Patient denies any current drug use. He does have history of a positive urine drug screen for cocaine back in May of this year Past Medical History  Diagnosis Date  . HTN (hypertension)   . GERD (gastroesophageal reflux disease)   . LBP (low back pain)   . LBBB (left bundle branch block)   . Hx of echocardiogram     echo 5/11: EF 45-50%, mild HK of Ap AS wall, ap Ant wall and true apex (LAD distribution), mild LAE  . NICM (nonischemic cardiomyopathy) ([place])   . CAD (coronary artery disease)     a. nonobstructive by LHC 6/11: oOM1 40%, mild luminal irregs elsewhere b. cath 09/2014L no obstructive disease c. 03/2016: Low-risk NST  . Systolic heart failure ([place])   . AICD (automatic cardioverter/defibrillator) present     a.  Unsuccessful LV lead placement 11/2014  . High cholesterol   . CKD (chronic kidney disease)    Past Surgical History  Procedure Laterality Date  . Left heart catheterization with coronary angiogram N/A 08/04/2013    Procedure:  LEFT HEART CATHETERIZATION WITH CORONARY ANGIOGRAM;  Surgeon: Larey Dresser, MD;  Location: [facility] CATH LAB;  Service: Cardiovascular;  Laterality: N/A;  . Cardiac catheterization Left 07/2013    with angiogram  . Bi-ventricular implantable cardioverter defibrillator N/A 12/14/2014    with failed LV lead placeemnt  . Thoracotomy Left 02/07/2015    Procedure: THORACOTOMY MAJOR;  Surgeon: Gaye Pollack, MD;  Location: [facility] OR;  Service: Thoracic;  Laterality: Left;  . Epicardial pacing lead placement N/A 02/07/2015    eicardial LV lead placed by Dr Laverta Baltimore  . Tendon repair Left 09/20/2015    Procedure: repair nerve tendon wrist;  Surgeon: Dayna Barker, MD;  Location: [facility];  Service: Plastics;  Laterality: Left;  . Debridement and closure wound Right 09/20/2015    Procedure: DEBRIDEMENT AND CLOSURE WOUND;  Surgeon: Dayna Barker, MD;  Location: [facility];  Service: Plastics;  Laterality: Right;   Family History  Problem Relation Age of Onset  . Heart attack Mother     in 57's  . Heart disease Mother     ischemic  . Heart attack Father     in 70's  . Heart disease Father     ischemic  . Congestive Heart Failure Mother   . Diabetes Mother   . Hypertension Mother   . Heart Problems Father   . Crohn's  disease Sister   . Prostate cancer Brother   . HIV/AIDS Brother    Social History  Substance Use Topics  . Smoking status: Current Every Day Smoker -- 0.12 packs/day for 25 years    Types: Cigarettes  . Smokeless tobacco: Never Used  . Alcohol Use: Yes     Comment: social    Review of Systems  All other systems reviewed and are negative.     Allergies  Review of patient's allergies indicates no known allergies.  Home Medications   Prior to Admission medications   Medication Sig Start Date End Date Taking? Authorizing Provider  aspirin EC 81 MG tablet Take 1 tablet (81 mg total) by mouth daily. 08/29/12  Yes Scott T Kathlen Mody, PA-C  atorvastatin (LIPITOR) 20 MG tablet Take 1 tablet (20  mg total) by mouth daily at 6 PM. Patient taking differently: Take 20 mg by mouth every morning.  04/08/16  Yes Erma Heritage, PA  carvedilol (COREG) 25 MG tablet TAKE 1 TABLET (25 MG TOTAL) BY MOUTH 2 (TWO) TIMES DAILY WITH A MEAL. 03/23/16  Yes Shaune Pascal Bensimhon, MD  diphenhydramine-acetaminophen (TYLENOL PM) 25-500 MG TABS tablet Take 1 tablet by mouth at bedtime as needed.   Yes Historical Provider, MD  furosemide (LASIX) 20 MG tablet Take 1 tablet (20 mg total) by mouth daily. 12/27/14  Yes Jolaine Artist, MD  hydrALAZINE (APRESOLINE) 25 MG tablet Take 1 tablet (25 mg total) by mouth 3 (three) times daily. 01/23/16  Yes Jolaine Artist, MD  omeprazole (PRILOSEC) 20 MG capsule Take 1 capsule (20 mg total) by mouth daily. 09/19/15  Yes Shaune Pascal Bensimhon, MD  sacubitril-valsartan (ENTRESTO) 97-103 MG Take 1 tablet by mouth 2 (two) times daily. 12/30/15  Yes Jolaine Artist, MD  spironolactone (ALDACTONE) 25 MG tablet TAKE 1 TABLET (25 MG TOTAL) BY MOUTH DAILY. 12/30/15  Yes Shaune Pascal Bensimhon, MD   BP 157/102 mmHg  Pulse 54  Temp(Src) 99.2 F (37.3 C) (Oral)  Resp 24  Ht 5\' 8"  (1.727 m)  Wt 104.327 kg  BMI 34.98 kg/m2  SpO2 93% Physical Exam  Constitutional: He appears well-developed and well-nourished. No distress.  HENT:  Head: Normocephalic and atraumatic.  Right Ear: External ear normal.  Left Ear: External ear normal.  Eyes: Conjunctivae are normal. Right eye exhibits no discharge. Left eye exhibits no discharge. No scleral icterus.  Neck: Neck supple. No tracheal deviation present.  Cardiovascular: Regular rhythm and intact distal pulses.  Tachycardia present.   Pulmonary/Chest: Effort normal and breath sounds normal. No stridor. No respiratory distress. He has no wheezes. He has no rales.  Abdominal: Soft. Bowel sounds are normal. He exhibits no distension. There is no tenderness. There is no rebound and no guarding.  Musculoskeletal: He exhibits no edema or  tenderness.  Neurological: He is alert. He has normal strength. No cranial nerve deficit (no facial droop, extraocular movements intact, no slurred speech) or sensory deficit. He exhibits normal muscle tone. He displays no seizure activity. Coordination normal.  Skin: Skin is warm and dry. No rash noted.  Psychiatric: He has a normal mood and affect.  Nursing note and vitals reviewed.   ED Course  Procedures (including critical care time) Labs Review Labs Reviewed  BASIC METABOLIC PANEL - Abnormal; Notable for the following:    Potassium 3.4 (*)    CO2 21 (*)    Glucose, Bld 122 (*)    Creatinine, Ser 1.59 (*)    GFR calc  non Af Amer 47 (*)    GFR calc Af Amer 55 (*)    All other components within normal limits  CBC - Abnormal; Notable for the following:    WBC 15.2 (*)    RBC 5.89 (*)    All other components within normal limits  D-DIMER, QUANTITATIVE (NOT AT [facility])  URINE RAPID DRUG SCREEN, [place], ED    I have personally reviewed and evaluated these images and lab results as part of my medical decision-making.   EKG Interpretation   Date/Time:  Tuesday June 09 2016 09:12:37 EDT Ventricular Rate:  120 PR Interval:  142 QRS Duration: 112 QT Interval:  358 QTC Calculation: 505 R Axis:   3 Text Interpretation:  Sinus tachycardia Left ventricular hypertrophy with  repolarization abnormality Abnormal ECG Since last tracing rate faster  Confirmed by Kamerin Axford  MD-J, Jyla Hopf ([id number]) on 06/09/2016 9:20:50 AM Also  confirmed by Terrilee Dudzik  MD-J, Diane Hanel [id number]), editor LOGAN, KIMBERLY ([id number])  on  06/09/2016 9:21:32 AM      MDM   1016  CXR, d dimer and second set of cardiac enzymes have not resulted or have been done.  Pt just informed us that his wife went into labor and he needs to leave the ED.  I explained to the patient that we do not have his test results done yet.  I understand that he needs to leave.  Pt can return any time to complete his  evaluation.    Dorie Rank, MD 06/09/16 1018

## 2016-06-09 NOTE — ED Notes (Signed)
Pt reported his wife went into labor and left AMA.

## 2016-08-01 ENCOUNTER — Other Ambulatory Visit (HOSPITAL_COMMUNITY): Payer: Self-pay | Admitting: Internal Medicine

## 2016-08-07 IMAGING — CR DG CHEST 2V
2 series · 2 of 2 positions shown · non-contrast
Comparison: 08/21/2014

CLINICAL DATA: Chronic systolic heart failure. Pacemaker placement.

EXAM:
CHEST  2 VIEW

[chest pa]
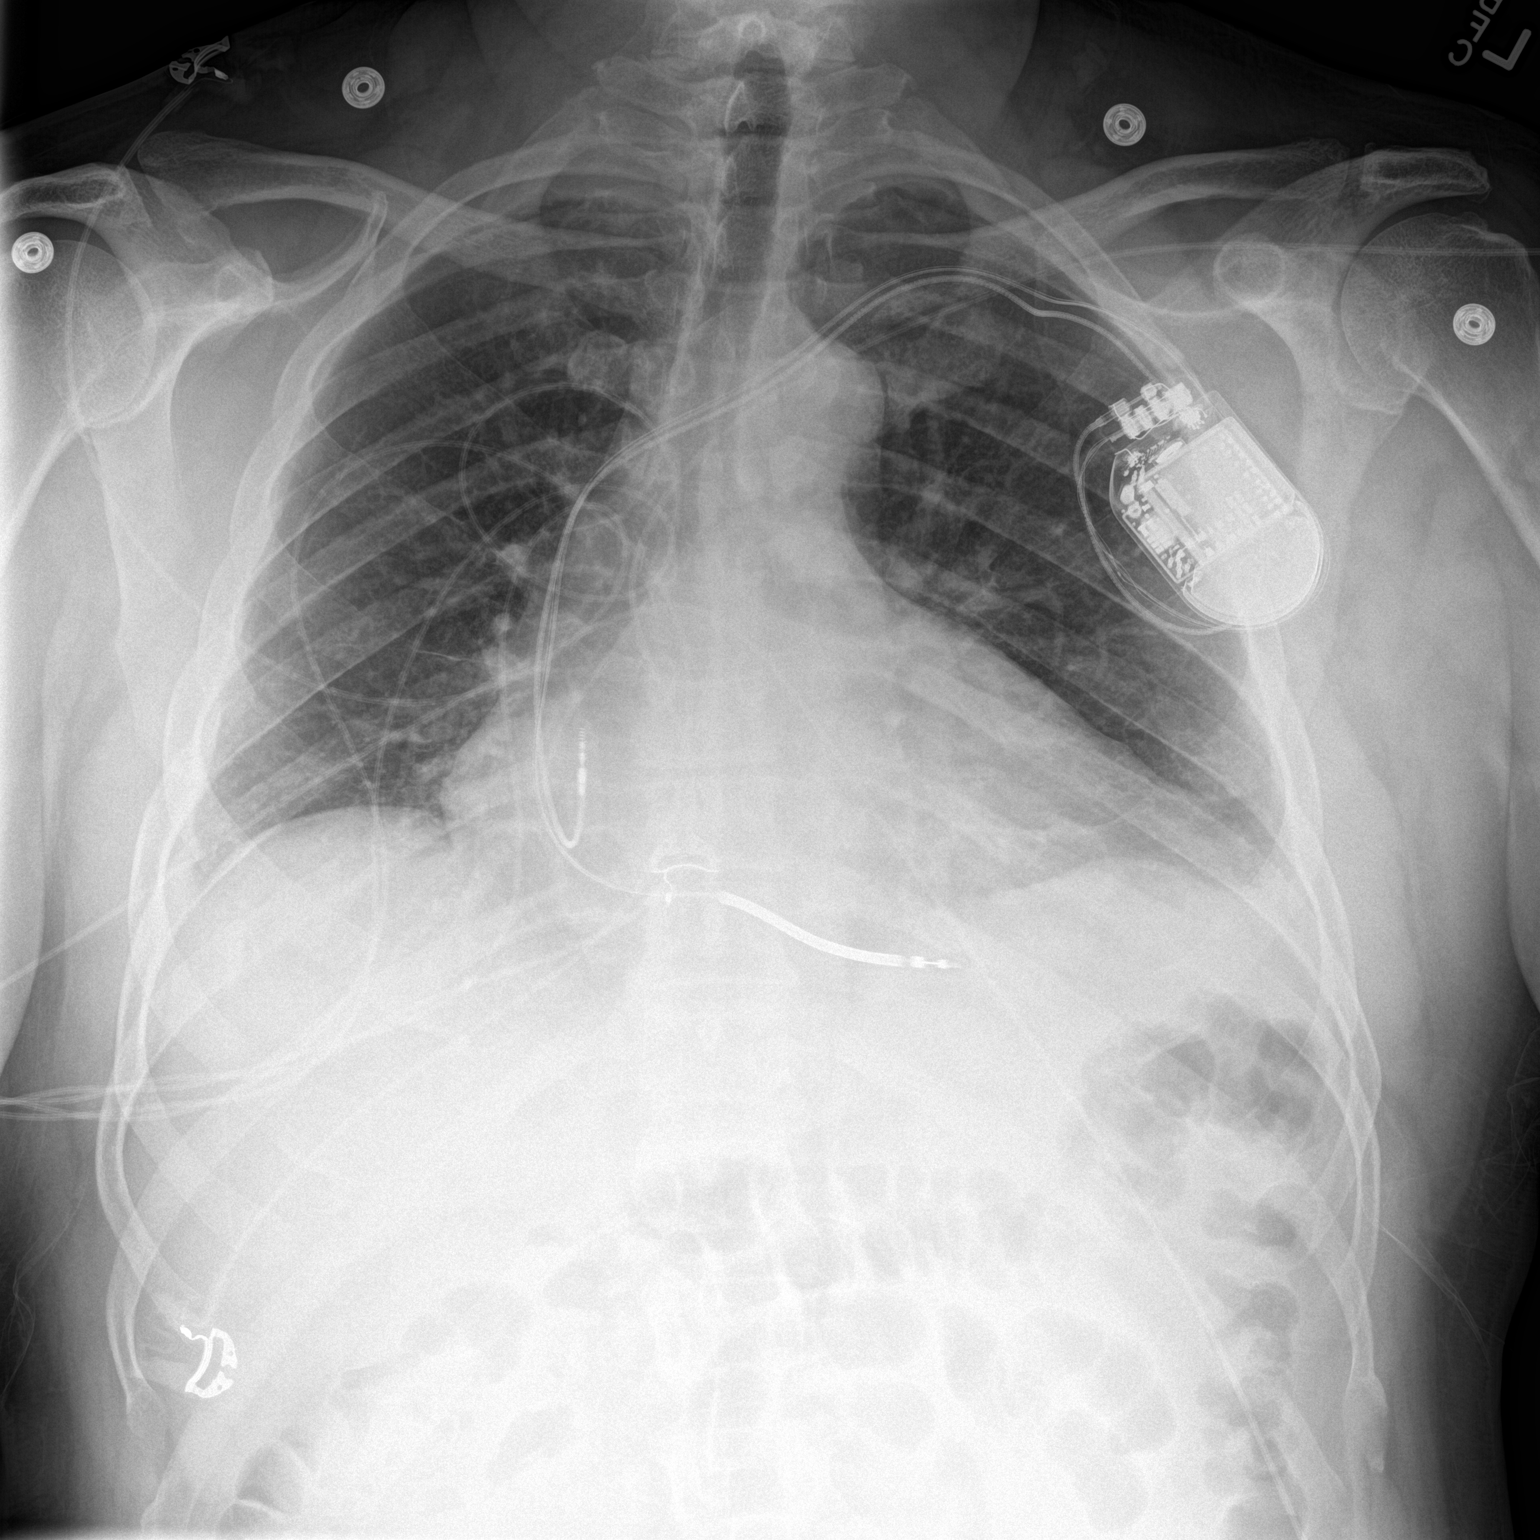

[chest lat]
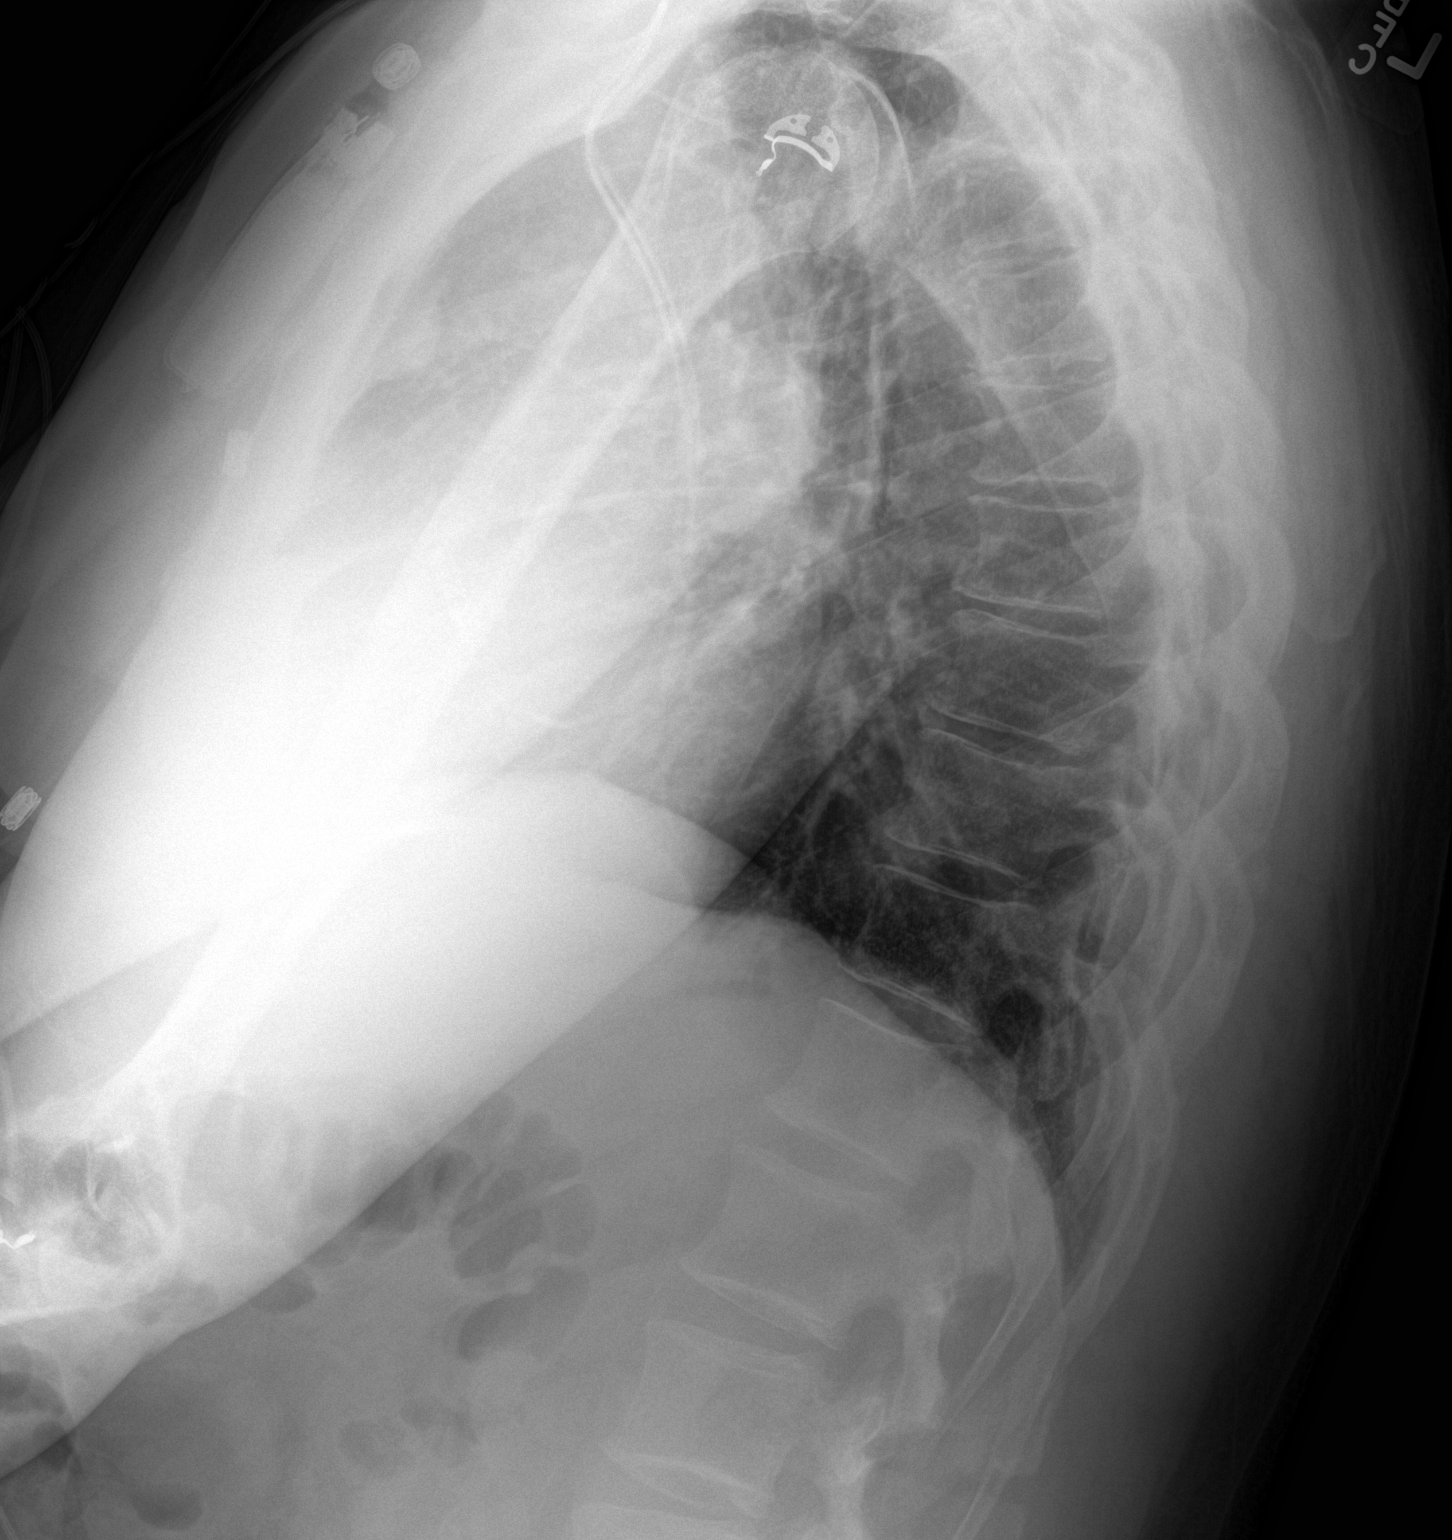

[2 of 2 positions shown; findings below may reference images not displayed]

FINDINGS: Dual lead transvenous pacemaker is seen in appropriate position. No
pneumothorax identified.

Decreased lung volumes noted. Both lungs remain clear. Heart size is
stable.
IMPRESSION: New transvenous pacemaker in appropriate position. No evidence of
pneumothorax or other acute findings.

## 2016-08-20 ENCOUNTER — Telehealth: Payer: Self-pay | Admitting: Cardiology

## 2016-08-20 NOTE — Telephone Encounter (Signed)
LMOVM requesting that pt send manual transmission b/c home monitor has not updated in at least 8 days.   

## 2016-10-13 IMAGING — CR DG CHEST 2V
2 series · 2 of 2 positions shown · non-contrast
Comparison: PA and lateral chest of February 10, 2015

CLINICAL DATA: Pain and shortness of breath, history of
defibrillator placement in Sunday November, 2014 with thoraco are commonly
and epicardial pacing leads placed on February 07, 2015.

EXAM:
CHEST  2 VIEW

[w chest pa]
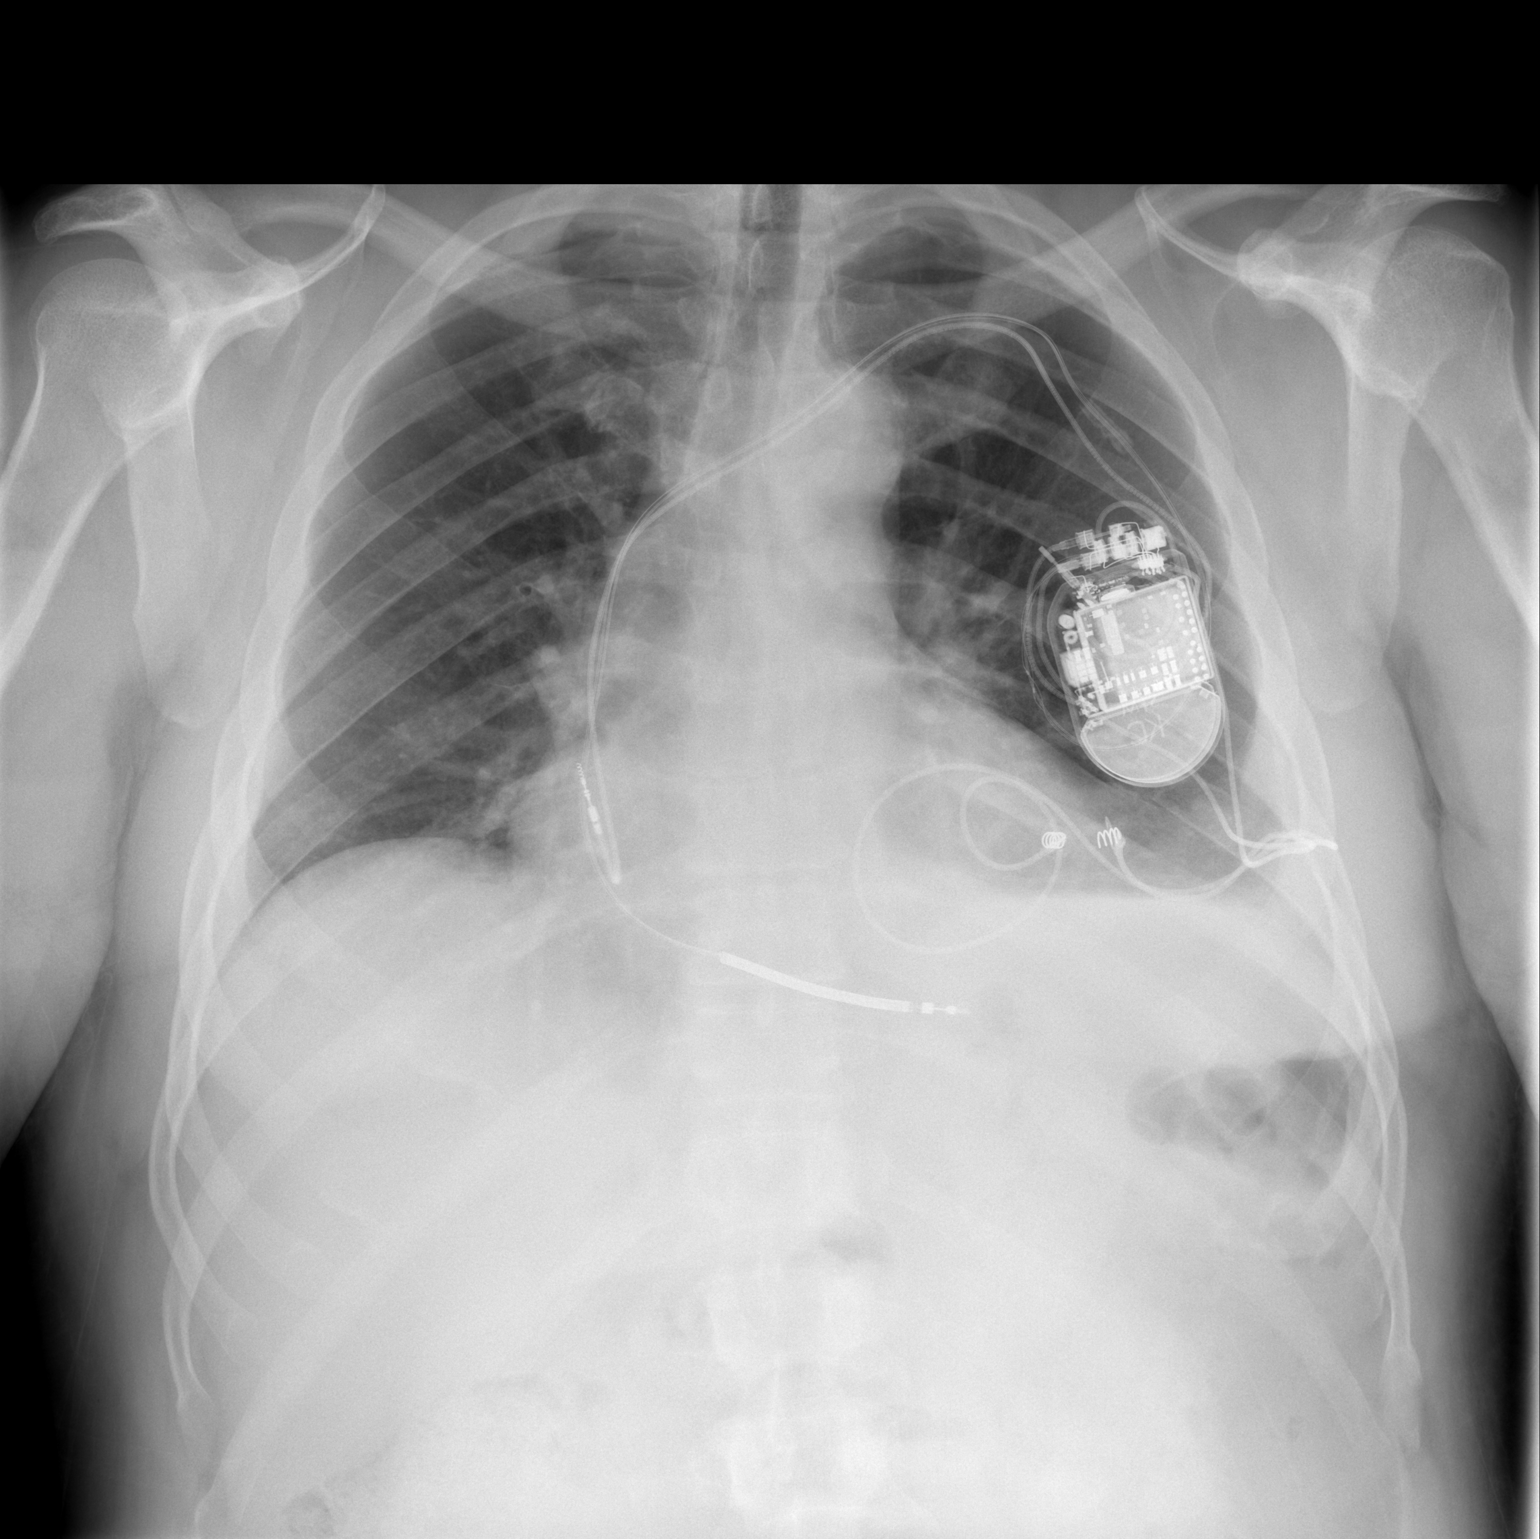

[w chest lat]
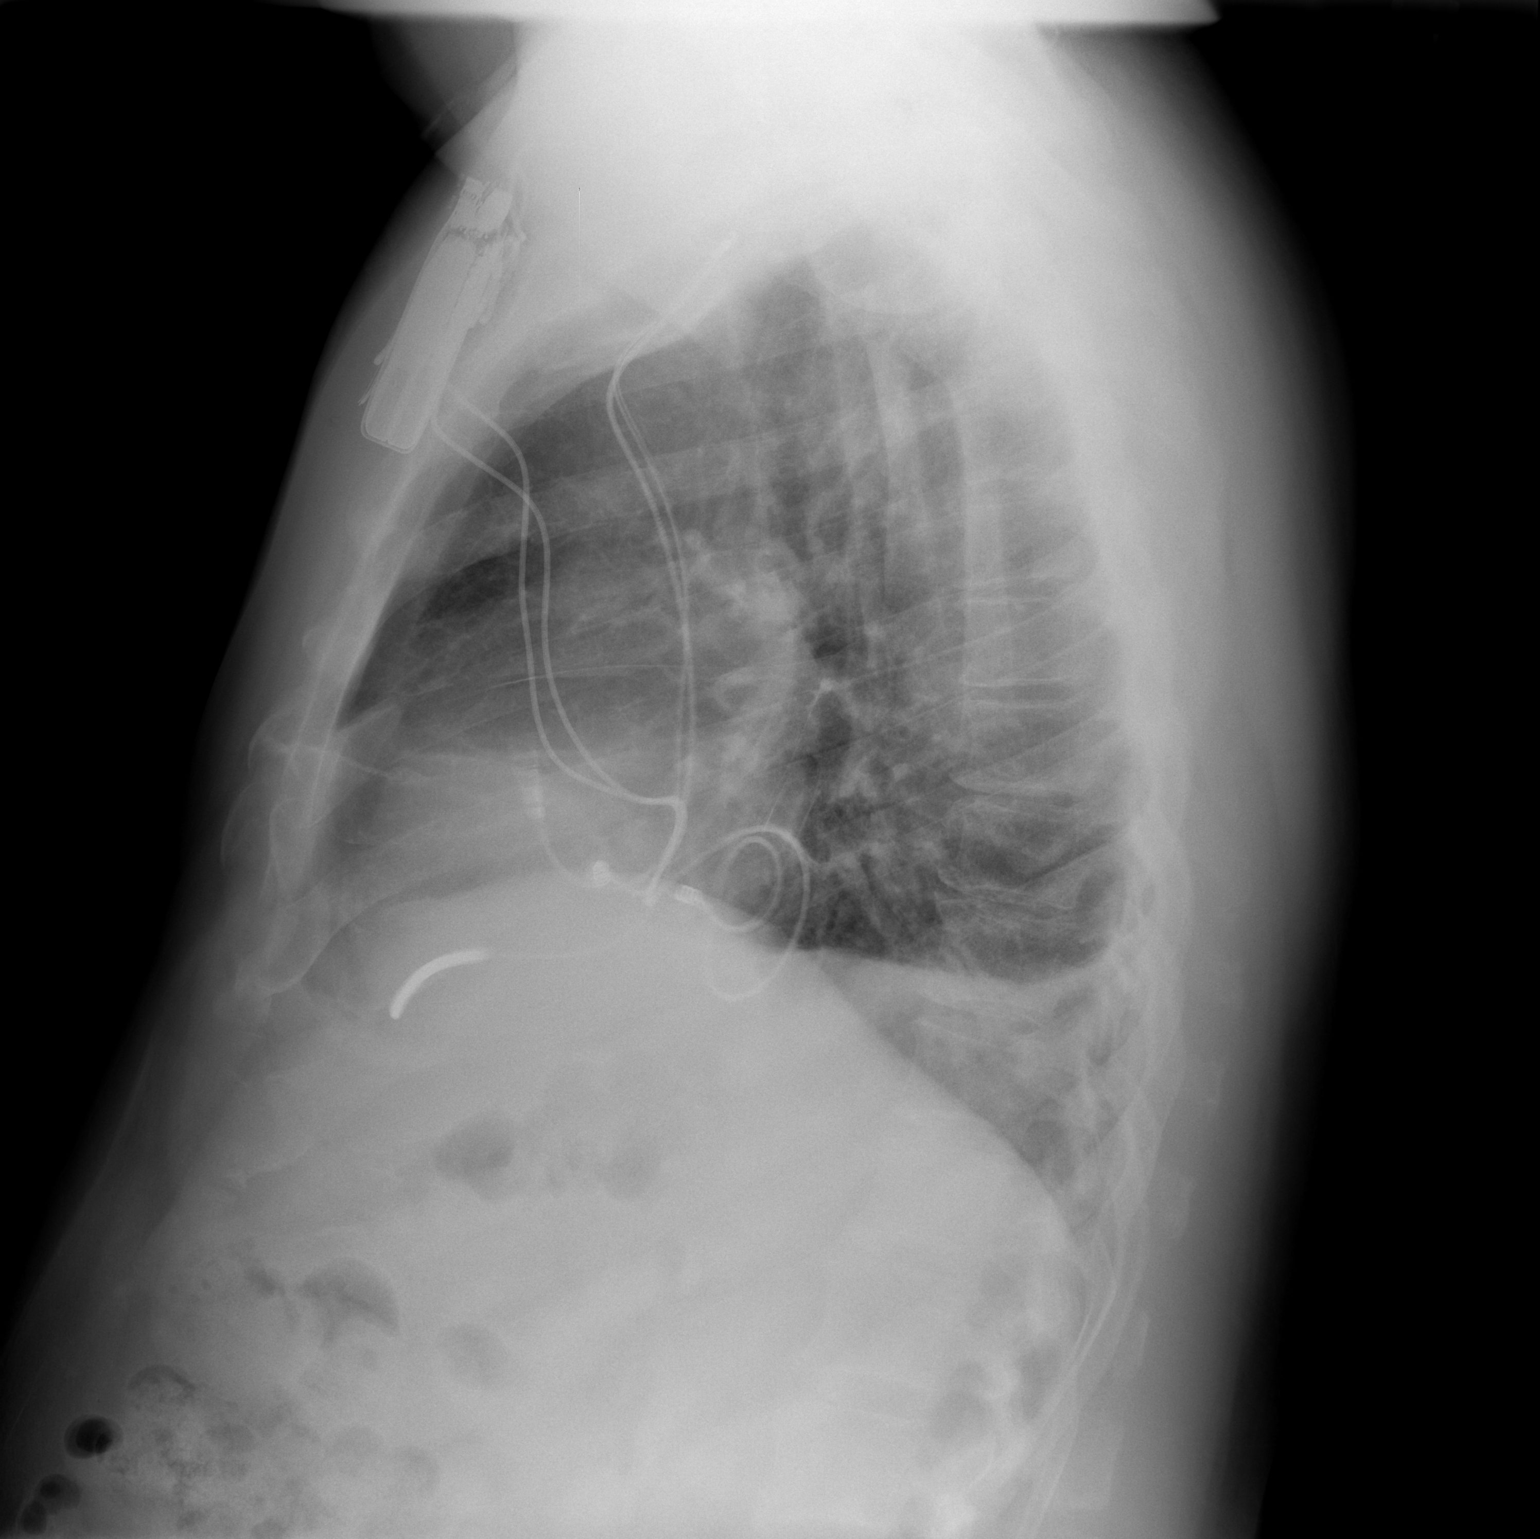

[2 of 2 positions shown; findings below may reference images not displayed]

FINDINGS: The right lung base is clear. There is persistent increased density
at the left lung base posteriorly. There is a small left pleural
effusion. The cardiac silhouette is top-normal in size. The
permanent pacemaker defibrillator leads are in reasonable position
radiographically. Left ventricular epicardial pacing leads are
present.
IMPRESSION: Interval clearing of right lower lobe atelectasis. There is
persistent left lower lobe atelectasis and small left pleural
effusion.

## 2016-12-23 ENCOUNTER — Telehealth: Payer: Self-pay | Admitting: Cardiology

## 2016-12-23 NOTE — Telephone Encounter (Signed)
Spoke w/ pt nurse at the facility he is at. Informed her that we did not receive a remote transmission. She is going to try again tomorrow.

## 2017-01-16 ENCOUNTER — Other Ambulatory Visit (HOSPITAL_COMMUNITY): Payer: Self-pay | Admitting: Internal Medicine

## 2017-01-19 ENCOUNTER — Other Ambulatory Visit (HOSPITAL_COMMUNITY): Payer: Self-pay | Admitting: Internal Medicine

## 2017-03-12 ENCOUNTER — Encounter: Payer: Self-pay | Admitting: Cardiology

## 2017-03-19 ENCOUNTER — Other Ambulatory Visit: Payer: Self-pay | Admitting: Internal Medicine

## 2017-04-23 ENCOUNTER — Ambulatory Visit (INDEPENDENT_AMBULATORY_CARE_PROVIDER_SITE_OTHER): Admitting: *Deleted

## 2017-04-23 DIAGNOSIS — I428 Other cardiomyopathies: Secondary | ICD-10-CM | POA: Diagnosis not present

## 2017-04-27 LAB — CUP PACEART REMOTE DEVICE CHECK
Battery Remaining Longevity: 50 mo
Brady Statistic AS VS Percent: 3.7 %
Brady Statistic RA Percent Paced: 1.1 %
HIGH POWER IMPEDANCE MEASURED VALUE: 68 Ohm
HIGH POWER IMPEDANCE MEASURED VALUE: 68 Ohm
Implantable Lead Implant Date: 20160115
Implantable Lead Location: 753858
Implantable Lead Location: 753860
Implantable Lead Model: 5076
Implantable Pulse Generator Implant Date: 20160115
Lead Channel Impedance Value: 390 Ohm
Lead Channel Pacing Threshold Amplitude: 0.25 V
Lead Channel Pacing Threshold Amplitude: 1 V
Lead Channel Pacing Threshold Pulse Width: 0.5 ms
Lead Channel Setting Pacing Amplitude: 2 V
Lead Channel Setting Sensing Sensitivity: 0.5 mV
MDC IDC LEAD IMPLANT DT: 20160115
MDC IDC LEAD IMPLANT DT: 20160310
MDC IDC LEAD LOCATION: 753859
MDC IDC LEAD SERIAL: 247936
MDC IDC MSMT BATTERY REMAINING PERCENTAGE: 64 %
MDC IDC MSMT BATTERY VOLTAGE: 2.95 V
MDC IDC MSMT LEADCHNL LV PACING THRESHOLD PULSEWIDTH: 0.5 ms
MDC IDC MSMT LEADCHNL RA IMPEDANCE VALUE: 460 Ohm
MDC IDC MSMT LEADCHNL RA PACING THRESHOLD AMPLITUDE: 0.5 V
MDC IDC MSMT LEADCHNL RA SENSING INTR AMPL: 3.2 mV
MDC IDC MSMT LEADCHNL RV IMPEDANCE VALUE: 480 Ohm
MDC IDC MSMT LEADCHNL RV PACING THRESHOLD PULSEWIDTH: 0.5 ms
MDC IDC MSMT LEADCHNL RV SENSING INTR AMPL: 12 mV
MDC IDC SESS DTM: 20180525122017
MDC IDC SET LEADCHNL LV PACING PULSEWIDTH: 0.5 ms
MDC IDC SET LEADCHNL RA PACING AMPLITUDE: 2 V
MDC IDC SET LEADCHNL RV PACING AMPLITUDE: 2.5 V
MDC IDC SET LEADCHNL RV PACING PULSEWIDTH: 0.5 ms
MDC IDC STAT BRADY AP VP PERCENT: 1.1 %
MDC IDC STAT BRADY AP VS PERCENT: 1 %
MDC IDC STAT BRADY AS VP PERCENT: 95 %
Pulse Gen Serial Number: 7209167

## 2017-04-27 NOTE — Progress Notes (Signed)
Remote ICD transmission.   

## 2017-04-30 ENCOUNTER — Encounter: Payer: Self-pay | Admitting: Cardiology

## 2017-07-27 ENCOUNTER — Encounter: Admitting: *Deleted

## 2017-07-30 ENCOUNTER — Encounter: Payer: Self-pay | Admitting: Cardiology

## 2017-08-23 ENCOUNTER — Ambulatory Visit (INDEPENDENT_AMBULATORY_CARE_PROVIDER_SITE_OTHER): Admitting: *Deleted

## 2017-08-23 DIAGNOSIS — I428 Other cardiomyopathies: Secondary | ICD-10-CM | POA: Diagnosis not present

## 2017-08-24 NOTE — Progress Notes (Signed)
Remote ICD transmission.   

## 2017-08-26 ENCOUNTER — Encounter: Payer: Self-pay | Admitting: Cardiology

## 2017-08-27 LAB — CUP PACEART REMOTE DEVICE CHECK
Battery Remaining Longevity: 47 mo
Battery Voltage: 2.93 V
Brady Statistic AP VS Percent: 1 %
Brady Statistic RA Percent Paced: 1.4 %
Date Time Interrogation Session: 20180924163326
HIGH POWER IMPEDANCE MEASURED VALUE: 68 Ohm
HIGH POWER IMPEDANCE MEASURED VALUE: 68 Ohm
Implantable Lead Implant Date: 20160115
Implantable Lead Location: 753859
Implantable Pulse Generator Implant Date: 20160115
Lead Channel Pacing Threshold Amplitude: 0.25 V
Lead Channel Pacing Threshold Amplitude: 1 V
Lead Channel Pacing Threshold Pulse Width: 0.5 ms
Lead Channel Sensing Intrinsic Amplitude: 12 mV
Lead Channel Setting Pacing Amplitude: 2 V
Lead Channel Setting Sensing Sensitivity: 0.5 mV
MDC IDC LEAD IMPLANT DT: 20160115
MDC IDC LEAD IMPLANT DT: 20160310
MDC IDC LEAD LOCATION: 753858
MDC IDC LEAD LOCATION: 753860
MDC IDC LEAD SERIAL: 247936
MDC IDC MSMT BATTERY REMAINING PERCENTAGE: 60 %
MDC IDC MSMT LEADCHNL LV IMPEDANCE VALUE: 400 Ohm
MDC IDC MSMT LEADCHNL RA IMPEDANCE VALUE: 460 Ohm
MDC IDC MSMT LEADCHNL RA PACING THRESHOLD AMPLITUDE: 0.5 V
MDC IDC MSMT LEADCHNL RA PACING THRESHOLD PULSEWIDTH: 0.5 ms
MDC IDC MSMT LEADCHNL RA SENSING INTR AMPL: 4.5 mV
MDC IDC MSMT LEADCHNL RV IMPEDANCE VALUE: 510 Ohm
MDC IDC MSMT LEADCHNL RV PACING THRESHOLD PULSEWIDTH: 0.5 ms
MDC IDC SET LEADCHNL LV PACING AMPLITUDE: 2 V
MDC IDC SET LEADCHNL LV PACING PULSEWIDTH: 0.5 ms
MDC IDC SET LEADCHNL RV PACING AMPLITUDE: 2.5 V
MDC IDC SET LEADCHNL RV PACING PULSEWIDTH: 0.5 ms
MDC IDC STAT BRADY AP VP PERCENT: 1.4 %
MDC IDC STAT BRADY AS VP PERCENT: 95 %
MDC IDC STAT BRADY AS VS PERCENT: 3.2 %
Pulse Gen Serial Number: 7209167

## 2017-08-31 ENCOUNTER — Encounter: Payer: Self-pay | Admitting: Internal Medicine

## 2017-09-08 ENCOUNTER — Telehealth: Payer: Self-pay | Admitting: Internal Medicine

## 2017-09-08 NOTE — Telephone Encounter (Signed)
Called number [phone] back the person that I spoke with had no record of someone calling about Roger Crosby and transmission. Stated that they would call back number to device clinic given to call back.

## 2017-09-08 NOTE — Telephone Encounter (Signed)
New message      1. Has your device fired? no  2. Is you device beeping? no  3. Are you experiencing draining or swelling at device site? no  4. Are you calling to see if we received your device transmission? They can not get the device to transmit from the nursing home   5. Have you passed out? no   Please route to [place]

## 2017-09-08 NOTE — Telephone Encounter (Signed)
Spoke w/ Nurse Donnal Debar and informed her that the reason patient received the letter from 08-31-17 is b/c he is over due for his yearly appt w/ MD. Nurse Donnal Debar verbalized understanding and stated that she would work on getting an order for pt to come for a routine office visit.

## 2017-11-24 ENCOUNTER — Ambulatory Visit (INDEPENDENT_AMBULATORY_CARE_PROVIDER_SITE_OTHER): Admitting: *Deleted

## 2017-11-24 DIAGNOSIS — I428 Other cardiomyopathies: Secondary | ICD-10-CM

## 2017-11-25 ENCOUNTER — Encounter: Payer: Self-pay | Admitting: Cardiology

## 2017-11-25 NOTE — Progress Notes (Signed)
Remote ICD transmission.   

## 2017-12-06 LAB — CUP PACEART REMOTE DEVICE CHECK
Battery Remaining Percentage: 58 %
Battery Voltage: 2.93 V
Brady Statistic AS VP Percent: 96 %
Brady Statistic RA Percent Paced: 1.3 %
HIGH POWER IMPEDANCE MEASURED VALUE: 73 Ohm
HighPow Impedance: 73 Ohm
Implantable Lead Implant Date: 20160115
Implantable Lead Location: 753859
Implantable Lead Location: 753860
Implantable Lead Model: 5076
Implantable Lead Model: 511212
Implantable Lead Serial Number: 247936
Lead Channel Impedance Value: 410 Ohm
Lead Channel Impedance Value: 510 Ohm
Lead Channel Pacing Threshold Amplitude: 0.25 V
Lead Channel Pacing Threshold Amplitude: 0.5 V
Lead Channel Pacing Threshold Amplitude: 1 V
Lead Channel Pacing Threshold Pulse Width: 0.5 ms
Lead Channel Pacing Threshold Pulse Width: 0.5 ms
Lead Channel Sensing Intrinsic Amplitude: 12 mV
Lead Channel Setting Pacing Amplitude: 2 V
Lead Channel Setting Pacing Pulse Width: 0.5 ms
MDC IDC LEAD IMPLANT DT: 20160115
MDC IDC LEAD IMPLANT DT: 20160310
MDC IDC LEAD LOCATION: 753858
MDC IDC MSMT BATTERY REMAINING LONGEVITY: 44 mo
MDC IDC MSMT LEADCHNL RA IMPEDANCE VALUE: 450 Ohm
MDC IDC MSMT LEADCHNL RA PACING THRESHOLD PULSEWIDTH: 0.5 ms
MDC IDC MSMT LEADCHNL RA SENSING INTR AMPL: 3.4 mV
MDC IDC PG IMPLANT DT: 20160115
MDC IDC SESS DTM: 20181221182710
MDC IDC SET LEADCHNL LV PACING AMPLITUDE: 2 V
MDC IDC SET LEADCHNL LV PACING PULSEWIDTH: 0.5 ms
MDC IDC SET LEADCHNL RV PACING AMPLITUDE: 2.5 V
MDC IDC SET LEADCHNL RV SENSING SENSITIVITY: 0.5 mV
MDC IDC STAT BRADY AP VP PERCENT: 1.3 %
MDC IDC STAT BRADY AP VS PERCENT: 1 %
MDC IDC STAT BRADY AS VS PERCENT: 3 %
Pulse Gen Serial Number: 7209167

## 2018-01-21 ENCOUNTER — Encounter (HOSPITAL_COMMUNITY): Payer: Self-pay | Admitting: *Deleted

## 2018-01-21 ENCOUNTER — Emergency Department (HOSPITAL_COMMUNITY)
Admission: EM | Admit: 2018-01-21 | Discharge: 2018-01-21 | Disposition: A | Discharge status: Home / Self Care | Payer: Medicaid Other | Attending: Emergency Medicine | Admitting: Emergency Medicine

## 2018-01-21 ENCOUNTER — Emergency Department (HOSPITAL_COMMUNITY): Payer: Medicaid Other

## 2018-01-21 DIAGNOSIS — F17210 Nicotine dependence, cigarettes, uncomplicated: Secondary | ICD-10-CM | POA: Diagnosis not present

## 2018-01-21 DIAGNOSIS — I2510 Atherosclerotic heart disease of native coronary artery without angina pectoris: Secondary | ICD-10-CM | POA: Insufficient documentation

## 2018-01-21 DIAGNOSIS — N183 Chronic kidney disease, stage 3 (moderate): Secondary | ICD-10-CM | POA: Insufficient documentation

## 2018-01-21 DIAGNOSIS — G8929 Other chronic pain: Secondary | ICD-10-CM | POA: Diagnosis not present

## 2018-01-21 DIAGNOSIS — Z7982 Long term (current) use of aspirin: Secondary | ICD-10-CM | POA: Insufficient documentation

## 2018-01-21 DIAGNOSIS — I130 Hypertensive heart and chronic kidney disease with heart failure and stage 1 through stage 4 chronic kidney disease, or unspecified chronic kidney disease: Secondary | ICD-10-CM | POA: Insufficient documentation

## 2018-01-21 DIAGNOSIS — I5022 Chronic systolic (congestive) heart failure: Secondary | ICD-10-CM | POA: Diagnosis not present

## 2018-01-21 DIAGNOSIS — M25512 Pain in left shoulder: Secondary | ICD-10-CM | POA: Diagnosis present

## 2018-01-21 MED ORDER — NAPROXEN 500 MG PO TABS: 500.0000 mg | ORAL_TABLET | Freq: Two times a day (BID) | ORAL | 0 refills | Status: DC

## 2018-01-21 NOTE — Discharge Instructions (Signed)
X-rays reassuring.  No broken bones.   EKG reassuring.  Please take 500 mg Naprosyn twice a day for pain.  Continue to apply heat over the shoulder for symptom relief.  I have listed the information below to [facility], a clinic across the street from [facility].  Please call to establish care and for follow-up of your ER visit today and ongoing shoulder pain.  Please return to the emergency department if you develop chest pain, trouble breathing, lightheadedness or have any new or worsening symptoms.

## 2018-01-21 NOTE — ED Provider Notes (Signed)
[place] EMERGENCY DEPARTMENT Provider Note   CSN: [id number] Arrival date & time: 01/21/18  4196     History   Chief Complaint Chief Complaint  Patient presents with  . Shoulder Pain    HPI Roger Crosby is a 57 y.o. male.  HPI   Roger Crosby is a 56 year old male with a history of CHF (EF 25-30%), AICD, hypertension, hyperlipidemia, CKD stage III who presents to the emergency department for evaluation of left shoulder pain.  Patient states that he has been having left shoulder pain for the past 5 months now.  Pain is located at the top of the shoulder and is worsened with shoulder abduction and flexion.  He does not have pain at rest.  States that pain is 8/10 severity at its worst and described as "aching" in nature.  Denies inciting injury or recent heavy lifting.  He tried taking Aleve with some relief.  Sometimes the arm feels "heavy."  He denies radiation of pain to the chest.  Denies shortness of breath, nausea/vomiting, diaphoresis, lightheadedness, numbness, weakness.  Per chart review, patient had a heart cath 07/2013 without obstructive disease.  Past Medical History:  Diagnosis Date  . AICD (automatic cardioverter/defibrillator) present    a.  Unsuccessful LV lead placement 11/2014  . CAD (coronary artery disease)    a. nonobstructive by LHC 6/11: oOM1 40%, mild luminal irregs elsewhere b. cath 09/2014L no obstructive disease c. 03/2016: Low-risk NST  . CKD (chronic kidney disease)   . GERD (gastroesophageal reflux disease)   . High cholesterol   . HTN (hypertension)   . Hx of echocardiogram    echo 5/11: EF 45-50%, mild HK of Ap AS wall, ap Ant wall and true apex (LAD distribution), mild LAE  . LBBB (left bundle branch block)   . LBP (low back pain)   . NICM (nonischemic cardiomyopathy) ([place])   . Systolic heart failure [facility])     Patient Active Problem List   Diagnosis Date Noted  . Substance abuse ([place]) 04/08/2016  . Atypical chest pain  04/07/2016  . CKD (chronic kidney disease) stage 3, GFR 30-59 ml/min (HCC) 04/07/2016  . S/P ICD (internal cardiac defibrillator) procedure 02/07/2015  . NICM (nonischemic cardiomyopathy) ([place]) 12/15/2014  . Chronic systolic CHF (congestive heart failure) ([place]) 08/23/2013  . DENTAL PAIN 04/04/2010  . DYSPNEA 04/04/2010  . LBBB (left bundle branch block) 02/14/2010  . SKIN TAG 02/14/2010  . LEG PAIN 02/14/2010  . HYPERLIPIDEMIA 01/07/2010  . ERECTILE DYSFUNCTION 04/09/2009  . 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 Benign essential HTN 01/28/2009  . GERD 01/28/2009  . LOW BACK PAIN 01/28/2009    Past Surgical History:  Procedure Laterality Date  . BI-VENTRICULAR IMPLANTABLE CARDIOVERTER DEFIBRILLATOR N/A 12/14/2014   with failed LV lead placeemnt  . CARDIAC CATHETERIZATION Left 07/2013   with angiogram  . DEBRIDEMENT AND CLOSURE WOUND Right 09/20/2015   Procedure: DEBRIDEMENT AND CLOSURE WOUND;  Surgeon: Dayna Barker, MD;  Location: [place];  Service: Plastics;  Laterality: Right;  . EPICARDIAL PACING LEAD PLACEMENT N/A 02/07/2015   eicardial LV lead placed by Dr Laverta Baltimore  . LEFT HEART CATHETERIZATION WITH CORONARY ANGIOGRAM N/A 08/04/2013   Procedure: LEFT HEART CATHETERIZATION WITH CORONARY ANGIOGRAM;  Surgeon: Larey Dresser, MD;  Location: [facility] CATH LAB;  Service: Cardiovascular;  Laterality: N/A;  . TENDON REPAIR Left 09/20/2015   Procedure: repair nerve tendon wrist;  Surgeon: Dayna Barker, MD;  Location: [place];  Service: Plastics;  Laterality:  Left;  . THORACOTOMY Left 02/07/2015   Procedure: THORACOTOMY MAJOR;  Surgeon: Gaye Pollack, MD;  Location: [facility] OR;  Service: Thoracic;  Laterality: Left;       Home Medications    Prior to Admission medications   Medication Sig Start Date End Date Taking? Authorizing Provider  aspirin EC 81 MG tablet Take 1 tablet (81 mg total) by mouth daily. 08/29/12   Richardson Dopp T, PA-C  atorvastatin (LIPITOR) 20 MG tablet Take  1 tablet (20 mg total) by mouth daily at 6 PM. Patient taking differently: Take 20 mg by mouth every morning.  04/08/16   Strader, Fransisco Hertz, PA-C  carvedilol (COREG) 25 MG tablet TAKE 1 TABLET (25 MG TOTAL) BY MOUTH 2 (TWO) TIMES DAILY WITH A MEAL. 01/18/17   Bensimhon, Shaune Pascal, MD  diphenhydramine-acetaminophen (TYLENOL PM) 25-500 MG TABS tablet Take 1 tablet by mouth at bedtime as needed.    [provider]  ENTRESTO 97-103 MG TAKE 1 TABLET BY MOUTH 2 (TWO) TIMES DAILY. 01/20/17   Bensimhon, Shaune Pascal, MD  furosemide (LASIX) 20 MG tablet Take 1 tablet (20 mg total) by mouth daily. 12/27/14   Bensimhon, Shaune Pascal, MD  hydrALAZINE (APRESOLINE) 25 MG tablet Take 1 tablet (25 mg total) by mouth 3 (three) times daily. 01/23/16   Bensimhon, Shaune Pascal, MD  omeprazole (PRILOSEC) 20 MG capsule TAKE 1 CAPSULE (20 MG TOTAL) BY MOUTH DAILY. 08/04/16   Bensimhon, Shaune Pascal, MD  spironolactone (ALDACTONE) 25 MG tablet TAKE 1 TABLET (25 MG TOTAL) BY MOUTH DAILY. 12/30/15   Bensimhon, Shaune Pascal, MD    Family History Family History  Problem Relation Age of Onset  . Heart attack Mother        in 1's  . Heart disease Mother        ischemic  . Heart attack Father        in 58's  . Heart disease Father        ischemic  . Congestive Heart Failure Mother   . Diabetes Mother   . Hypertension Mother   . Heart Problems Father   . Crohn's disease Sister   . Prostate cancer Brother   . HIV/AIDS Brother     Social History Social History   Tobacco Use  . Smoking status: Current Every Day Smoker    Packs/day: 0.12    Years: 25.00    Pack years: 3.00    Types: Cigarettes  . Smokeless tobacco: Never Used  Substance Use Topics  . Alcohol use: Yes    Comment: social  . Drug use: No    Comment: 12/14/2014 "last crack was in 2014"     Allergies   Patient has no known allergies.   Review of Systems Review of Systems  Constitutional: Negative for diaphoresis.  Respiratory: Negative for shortness of  breath.   Cardiovascular: Negative for chest pain.  Gastrointestinal: Negative for abdominal pain, nausea and vomiting.  Musculoskeletal: Positive for arthralgias (left shoulder).  Skin: Negative for color change, rash and wound.  Neurological: Negative for dizziness, weakness, light-headedness and numbness.  Psychiatric/Behavioral: Negative for agitation.     Physical Exam Updated Vital Signs BP (!) 131/91 (BP Location: Right Arm)   Pulse 82   Temp 98 F (36.7 C) (Oral)   Resp 20   SpO2 100%   Physical Exam  Constitutional: He is oriented to person, place, and time. He appears well-developed and well-nourished. No distress.  HENT:  Head: Normocephalic and atraumatic.  Eyes: Right eye exhibits no discharge. Left eye exhibits no discharge.  Cardiovascular: Normal rate, regular rhythm and intact distal pulses. Exam reveals no friction rub.  No murmur heard. Pulmonary/Chest: Effort normal and breath sounds normal. No stridor. No respiratory distress. He has no wheezes. He has no rales.  Abdominal: Soft. Bowel sounds are normal. There is no tenderness. There is no guarding.  Musculoskeletal:       Arms: Left shoulder with tenderness to palpation as depicted in image. Full active flexion/extension and abduction/adduction. Negative empty can test, negative Neer's, no lift off. No swelling, erythema or ecchymosis present. No step-off, crepitus, or deformity appreciated. 5/5 muscle strength of UE. 2+ radial pulse, sensation intact to light touch and all compartments soft.  Neurological: He is alert and oriented to person, place, and time. Coordination normal.  Skin: Skin is warm and dry. Capillary refill takes less than 2 seconds. He is not diaphoretic.  Psychiatric: He has a normal mood and affect. His behavior is normal.  Nursing note and vitals reviewed.    ED Treatments / Results  Labs (all labs ordered are listed, but only abnormal results are displayed) Labs Reviewed - No data  to display  EKG  EKG Interpretation  Date/Time:  Friday January 21 2018 08:49:33 EST Ventricular Rate:  77 PR Interval:  154 QRS Duration: 106 QT Interval:  394 QTC Calculation: 445 R Axis:   -51 Text Interpretation:  Electronic ventricular pacemaker Confirmed by Nat Christen [phone]) on 01/21/2018 1:35:55 PM       Radiology Dg Shoulder Left  Result Date: 01/21/2018 CLINICAL DATA:  57 year old male with left shoulder pain radiating down the arm for 5 months. EXAM: LEFT SHOULDER - 2+ VIEW COMPARISON:  Chest radiograph 06/09/2016. FINDINGS: Chronic left chest cardiac AICD generator partially projects over the proximal left humerus on the [place] view. No glenohumeral joint dislocation. Proximal left humerus appears intact. The visible left clavicle and scapula are intact. Mild degenerative cortical irregularity about the greater tuberosity. Mild degenerative glenoid subchondral sclerosis and osteophytosis. Negative visible left ribs and lung parenchyma. IMPRESSION: No acute osseous abnormality identified about the left shoulder. Electronically Signed   By: Genevie Ann M.D.   On: 01/21/2018 09:32    Procedures Procedures (including critical care time)  Medications Ordered in ED Medications - No data to display   Initial Impression / Assessment and Plan / ED Course  I have reviewed the triage vital signs and the nursing notes.  Pertinent labs & imaging results that were available during my care of the patient were reviewed by me and considered in my medical decision making (see chart for details).     Presents with left shoulder pain for the past five months. No inciting injury. Pain worsened with movement and relieved with aleve. No pain at rest.  No associated chest pain, shortness of breath, diaphoresis, lightheadedness. On exam patient is in NAD and VSS. Left shoulder is tender to palpation. No erythema, warmth or induration to suggest infection. LUE neurovascularly intact. Xray  without acute abnormality. Given presentation feel that pain is musculoskeletal. Do not suspect ACS given no pain at rest, worsened with movement, reproducible to palpation and no chest pain, SOB, diaphoresis. EKG non-ischemic.   Have counseled patient on use of NSAIDs for pain and heat over the shoulder for symptomatic relief. Patient does not have a primary care provider, have given him information to follow-up with [facility] regarding today's ER visit.  Discussed strict return precautions the patient agrees  and voiced understanding the above plan.  Discussed this patient with Dr. Lacinda Axon who agrees with above plan and discharge home.   Final Clinical Impressions(s) / ED Diagnoses   Final diagnoses:  Chronic left shoulder pain    ED Discharge Orders        Ordered    naproxen (NAPROSYN) 500 MG tablet  2 times daily     01/21/18 1351       Bernarda Caffey 01/21/18 1726    Nat Christen, MD 01/22/18 [phone]

## 2018-01-21 NOTE — ED Triage Notes (Signed)
Pt in c/o left shoulder pain, pain is worse with certain movements but pain is consistent, denies injury, area is not tender to palpation, no distress noted, pain has been going on for 5 months

## 2018-02-23 ENCOUNTER — Telehealth: Payer: Self-pay | Admitting: Cardiology

## 2018-02-23 ENCOUNTER — Ambulatory Visit (INDEPENDENT_AMBULATORY_CARE_PROVIDER_SITE_OTHER): Admitting: *Deleted

## 2018-02-23 DIAGNOSIS — I428 Other cardiomyopathies: Secondary | ICD-10-CM | POA: Diagnosis not present

## 2018-02-23 NOTE — Telephone Encounter (Signed)
Spoke with pt and reminded pt of remote transmission that is due today. Pt verbalized understanding.   

## 2018-02-25 NOTE — Progress Notes (Signed)
Remote ICD transmission.   

## 2018-02-28 ENCOUNTER — Encounter (HOSPITAL_COMMUNITY): Payer: Self-pay | Admitting: Emergency Medicine

## 2018-02-28 ENCOUNTER — Encounter: Payer: Self-pay | Admitting: Cardiology

## 2018-02-28 ENCOUNTER — Emergency Department (HOSPITAL_COMMUNITY)
Admission: EM | Admit: 2018-02-28 | Discharge: 2018-02-28 | Disposition: A | Discharge status: Home / Self Care | Payer: Medicaid Other | Attending: Emergency Medicine | Admitting: Emergency Medicine

## 2018-02-28 DIAGNOSIS — S40862A Insect bite (nonvenomous) of left upper arm, initial encounter: Secondary | ICD-10-CM | POA: Insufficient documentation

## 2018-02-28 DIAGNOSIS — I2510 Atherosclerotic heart disease of native coronary artery without angina pectoris: Secondary | ICD-10-CM | POA: Diagnosis not present

## 2018-02-28 DIAGNOSIS — N183 Chronic kidney disease, stage 3 (moderate): Secondary | ICD-10-CM | POA: Diagnosis not present

## 2018-02-28 DIAGNOSIS — F17210 Nicotine dependence, cigarettes, uncomplicated: Secondary | ICD-10-CM | POA: Insufficient documentation

## 2018-02-28 DIAGNOSIS — Z79899 Other long term (current) drug therapy: Secondary | ICD-10-CM | POA: Diagnosis not present

## 2018-02-28 DIAGNOSIS — Z7982 Long term (current) use of aspirin: Secondary | ICD-10-CM | POA: Insufficient documentation

## 2018-02-28 DIAGNOSIS — W57XXXA Bitten or stung by nonvenomous insect and other nonvenomous arthropods, initial encounter: Secondary | ICD-10-CM | POA: Insufficient documentation

## 2018-02-28 DIAGNOSIS — I130 Hypertensive heart and chronic kidney disease with heart failure and stage 1 through stage 4 chronic kidney disease, or unspecified chronic kidney disease: Secondary | ICD-10-CM | POA: Diagnosis not present

## 2018-02-28 DIAGNOSIS — S4992XA Unspecified injury of left shoulder and upper arm, initial encounter: Secondary | ICD-10-CM | POA: Diagnosis present

## 2018-02-28 DIAGNOSIS — I5022 Chronic systolic (congestive) heart failure: Secondary | ICD-10-CM | POA: Diagnosis not present

## 2018-02-28 DIAGNOSIS — Y929 Unspecified place or not applicable: Secondary | ICD-10-CM | POA: Diagnosis not present

## 2018-02-28 DIAGNOSIS — Y999 Unspecified external cause status: Secondary | ICD-10-CM | POA: Diagnosis not present

## 2018-02-28 DIAGNOSIS — Y939 Activity, unspecified: Secondary | ICD-10-CM | POA: Diagnosis not present

## 2018-02-28 NOTE — ED Provider Notes (Signed)
[place] EMERGENCY DEPARTMENT Provider Note   CSN: [id number] Arrival date & time: 02/28/18  2215     History   Chief Complaint Chief Complaint  Patient presents with  . Insect Bite    HPI Roger Crosby is a 57 y.o. male presents to the emergency department today for insect bite that occurred 2 days prior.  Patient states that he believes he was bit by an insect, did not witness the bite.  States that the area is sore and itchy to the left upper arm.  States he has been scratching at the area.  Has not tried any interventions prior to arrival.  Denies fever, chills, dyspnea, or any other lesions.  He is requesting work note.  HPI  Past Medical History:  Diagnosis Date  . AICD (automatic cardioverter/defibrillator) present    a.  Unsuccessful LV lead placement 11/2014  . CAD (coronary artery disease)    a. nonobstructive by LHC 6/11: oOM1 40%, mild luminal irregs elsewhere b. cath 09/2014L no obstructive disease c. 03/2016: Low-risk NST  . CKD (chronic kidney disease)   . GERD (gastroesophageal reflux disease)   . High cholesterol   . HTN (hypertension)   . Hx of echocardiogram    echo 5/11: EF 45-50%, mild HK of Ap AS wall, ap Ant wall and true apex (LAD distribution), mild LAE  . LBBB (left bundle branch block)   . LBP (low back pain)   . NICM (nonischemic cardiomyopathy) ([place])   . Systolic heart failure [facility])     Patient Active Problem List   Diagnosis Date Noted  . Substance abuse ([place]) 04/08/2016  . Atypical chest pain 04/07/2016  . CKD (chronic kidney disease) stage 3, GFR 30-59 ml/min (HCC) 04/07/2016  . S/P ICD (internal cardiac defibrillator) procedure 02/07/2015  . NICM (nonischemic cardiomyopathy) ([place]) 12/15/2014  . Chronic systolic CHF (congestive heart failure) ([place]) 08/23/2013  . DENTAL PAIN 04/04/2010  . DYSPNEA 04/04/2010  . LBBB (left bundle branch block) 02/14/2010  . SKIN TAG 02/14/2010  . LEG PAIN 02/14/2010  . HYPERLIPIDEMIA  01/07/2010  . ERECTILE DYSFUNCTION 04/09/2009  . 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 Benign essential HTN 01/28/2009  . GERD 01/28/2009  . LOW BACK PAIN 01/28/2009    Past Surgical History:  Procedure Laterality Date  . BI-VENTRICULAR IMPLANTABLE CARDIOVERTER DEFIBRILLATOR N/A 12/14/2014   with failed LV lead placeemnt  . CARDIAC CATHETERIZATION Left 07/2013   with angiogram  . DEBRIDEMENT AND CLOSURE WOUND Right 09/20/2015   Procedure: DEBRIDEMENT AND CLOSURE WOUND;  Surgeon: Dayna Barker, MD;  Location: [place];  Service: Plastics;  Laterality: Right;  . EPICARDIAL PACING LEAD PLACEMENT N/A 02/07/2015   eicardial LV lead placed by Dr Laverta Baltimore  . LEFT HEART CATHETERIZATION WITH CORONARY ANGIOGRAM N/A 08/04/2013   Procedure: LEFT HEART CATHETERIZATION WITH CORONARY ANGIOGRAM;  Surgeon: Larey Dresser, MD;  Location: [facility] CATH LAB;  Service: Cardiovascular;  Laterality: N/A;  . TENDON REPAIR Left 09/20/2015   Procedure: repair nerve tendon wrist;  Surgeon: Dayna Barker, MD;  Location: [place];  Service: Plastics;  Laterality: Left;  . THORACOTOMY Left 02/07/2015   Procedure: THORACOTOMY MAJOR;  Surgeon: Gaye Pollack, MD;  Location: [facility] OR;  Service: Thoracic;  Laterality: Left;        Home Medications    Prior to Admission medications   Medication Sig Start Date End Date Taking? Authorizing Provider  aspirin EC 81 MG tablet Take 1 tablet (81 mg total) by mouth  daily. 08/29/12   Richardson Dopp T, PA-C  atorvastatin (LIPITOR) 20 MG tablet Take 1 tablet (20 mg total) by mouth daily at 6 PM. Patient taking differently: Take 20 mg by mouth every morning.  04/08/16   Strader, Fransisco Hertz, PA-C  carvedilol (COREG) 25 MG tablet TAKE 1 TABLET (25 MG TOTAL) BY MOUTH 2 (TWO) TIMES DAILY WITH A MEAL. 01/18/17   Bensimhon, Shaune Pascal, MD  diphenhydramine-acetaminophen (TYLENOL PM) 25-500 MG TABS tablet Take 1 tablet by mouth at bedtime as needed.    [provider]  ENTRESTO  97-103 MG TAKE 1 TABLET BY MOUTH 2 (TWO) TIMES DAILY. 01/20/17   Bensimhon, Shaune Pascal, MD  furosemide (LASIX) 20 MG tablet Take 1 tablet (20 mg total) by mouth daily. 12/27/14   Bensimhon, Shaune Pascal, MD  hydrALAZINE (APRESOLINE) 25 MG tablet Take 1 tablet (25 mg total) by mouth 3 (three) times daily. 01/23/16   Bensimhon, Shaune Pascal, MD  naproxen (NAPROSYN) 500 MG tablet Take 1 tablet (500 mg total) by mouth 2 (two) times daily. 01/21/18   Glyn Ade, PA-C  omeprazole (PRILOSEC) 20 MG capsule TAKE 1 CAPSULE (20 MG TOTAL) BY MOUTH DAILY. 08/04/16   Bensimhon, Shaune Pascal, MD  spironolactone (ALDACTONE) 25 MG tablet TAKE 1 TABLET (25 MG TOTAL) BY MOUTH DAILY. 12/30/15   Bensimhon, Shaune Pascal, MD    Family History Family History  Problem Relation Age of Onset  . Heart attack Mother        in 40's  . Heart disease Mother        ischemic  . Congestive Heart Failure Mother   . Diabetes Mother   . Hypertension Mother   . Heart attack Father        in 71's  . Heart disease Father        ischemic  . Heart Problems Father   . Crohn's disease Sister   . Prostate cancer Brother   . HIV/AIDS Brother     Social History Social History   Tobacco Use  . Smoking status: Current Every Day Smoker    Packs/day: 0.12    Years: 25.00    Pack years: 3.00    Types: Cigarettes  . Smokeless tobacco: Never Used  Substance Use Topics  . Alcohol use: Yes    Comment: social  . Drug use: No    Types: "Crack" cocaine    Comment: 12/14/2014 "last crack was in 2014"     Allergies   Patient has no known allergies.   Review of Systems Review of Systems  Constitutional: Negative for chills and fever.  Respiratory: Negative for shortness of breath.   Cardiovascular: Negative for chest pain.  Skin: Positive for wound. Negative for rash.     Physical Exam Updated Vital Signs BP (!) 154/91 (BP Location: Right Arm)   Pulse (!) 104   Temp 98.5 F (36.9 C) (Oral)   Resp 16   Ht 5\' 8"  (1.727 m)   Wt  104.3 kg (230 lb)   SpO2 97%   BMI 34.97 kg/m   Physical Exam  Constitutional: He appears well-developed and well-nourished. No distress.  HENT:  Head: Normocephalic and atraumatic.  Eyes: Conjunctivae are normal. Right eye exhibits no discharge. Left eye exhibits no discharge.  Cardiovascular: Normal rate and regular rhythm.  Pulmonary/Chest: Effort normal and breath sounds normal. No stridor. No respiratory distress. He has no wheezes.  Neurological: He is alert.  Clear speech.   Skin:  Approximately 3 mm  diameter scab with mild surrounding erythema and minimal induration to the left upper arm.  Nontender to palpation.  No overlying warmth. No fluctuance. No other lesions.   Psychiatric: He has a normal mood and affect. His behavior is normal. Thought content normal.  Nursing note and vitals reviewed.      ED Treatments / Results  Labs (all labs ordered are listed, but only abnormal results are displayed) Labs Reviewed - No data to display  EKG None  Radiology No results found.  Procedures Procedures (including critical care time)  Medications Ordered in ED Medications - No data to display   Initial Impression / Assessment and Plan / ED Course  I have reviewed the triage vital signs and the nursing notes.  Pertinent labs & imaging results that were available during my care of the patient were reviewed by me and considered in my medical decision making (see chart for details).   Patient presents with concern for insect bite.  He is nontoxic-appearing, in no apparent distress, vitals WNL other than initial tachycardia and hypertension, normalized on my exam and on repeat vitals.  Physical exam as above.  Area does not appear to have superimposed infection.  Bacitracin ointment and Band-Aid applied.  Recommended Tylenol for soreness and Benadryl for pruritus. Instructed PCP follow-up with return precautions.  Provided opportunity for questions, patient confirmed  understanding and is agreement with plan  Vitals:   02/28/18 2221 02/28/18 2338  BP: (!) 154/91 130/77  Pulse: (!) 104 89  Resp: 16 18  Temp: 98.5 F (36.9 C)   SpO2: 97% 97%    Final Clinical Impressions(s) / ED Diagnoses   Final diagnoses:  Insect bite of left upper arm, initial encounter    ED Discharge Orders    None       Amaryllis Dyke, PA-C 02/28/18 2353    Hayden Rasmussen, MD 03/01/18 1217

## 2018-02-28 NOTE — Discharge Instructions (Addendum)
You were seen in the emergency department today for an insect bite.  The area does not appear overly infected.  We recommend you use Tylenol for pain and Benadryl for itching per over-the-counter dosing instructions.  You may apply a topical antibiotic to this area.  Follow-up with your primary care doctor in 1 week if the area has not improved.  Return to the emergency department for new or worsening symptoms including but not limited to fever, chills, increased redness, increased swelling, increased pain, or any other concerns.

## 2018-02-28 NOTE — ED Triage Notes (Signed)
Pt reports insect bite to L upper arm since Saturday. Scab noted approx size of pencil eraser, some redness noted.

## 2018-03-05 LAB — CUP PACEART REMOTE DEVICE CHECK
Battery Remaining Longevity: 42 mo
Battery Remaining Percentage: 54 %
Battery Voltage: 2.92 V
Brady Statistic AP VP Percent: 1.3 %
Brady Statistic AS VS Percent: 2.8 %
Brady Statistic RA Percent Paced: 1.2 %
Date Time Interrogation Session: 20190328015237
HIGH POWER IMPEDANCE MEASURED VALUE: 71 Ohm
HIGH POWER IMPEDANCE MEASURED VALUE: 71 Ohm
Implantable Lead Implant Date: 20160115
Implantable Lead Implant Date: 20160115
Implantable Lead Location: 753858
Implantable Lead Model: 511212
Lead Channel Impedance Value: 410 Ohm
Lead Channel Impedance Value: 480 Ohm
Lead Channel Impedance Value: 510 Ohm
Lead Channel Pacing Threshold Amplitude: 0.25 V
Lead Channel Pacing Threshold Amplitude: 1 V
Lead Channel Pacing Threshold Pulse Width: 0.5 ms
Lead Channel Pacing Threshold Pulse Width: 0.5 ms
Lead Channel Sensing Intrinsic Amplitude: 12 mV
Lead Channel Setting Pacing Pulse Width: 0.5 ms
Lead Channel Setting Sensing Sensitivity: 0.5 mV
MDC IDC LEAD IMPLANT DT: 20160310
MDC IDC LEAD LOCATION: 753859
MDC IDC LEAD LOCATION: 753860
MDC IDC LEAD SERIAL: 247936
MDC IDC MSMT LEADCHNL RA PACING THRESHOLD AMPLITUDE: 0.5 V
MDC IDC MSMT LEADCHNL RA PACING THRESHOLD PULSEWIDTH: 0.5 ms
MDC IDC MSMT LEADCHNL RA SENSING INTR AMPL: 4.2 mV
MDC IDC PG IMPLANT DT: 20160115
MDC IDC PG SERIAL: 7209167
MDC IDC SET LEADCHNL LV PACING AMPLITUDE: 2 V
MDC IDC SET LEADCHNL LV PACING PULSEWIDTH: 0.5 ms
MDC IDC SET LEADCHNL RA PACING AMPLITUDE: 2 V
MDC IDC SET LEADCHNL RV PACING AMPLITUDE: 2.5 V
MDC IDC STAT BRADY AP VS PERCENT: 1 %
MDC IDC STAT BRADY AS VP PERCENT: 96 %

## 2018-03-11 ENCOUNTER — Observation Stay (HOSPITAL_BASED_OUTPATIENT_CLINIC_OR_DEPARTMENT_OTHER): Payer: Medicaid Other

## 2018-03-11 ENCOUNTER — Emergency Department (HOSPITAL_COMMUNITY): Payer: Medicaid Other

## 2018-03-11 ENCOUNTER — Observation Stay (HOSPITAL_COMMUNITY)
Admission: EM | Admit: 2018-03-11 | Discharge: 2018-03-13 | Disposition: A | Discharge status: Home / Self Care | Payer: Medicaid Other | Attending: Student in an Organized Health Care Education/Training Program | Admitting: Student in an Organized Health Care Education/Training Program

## 2018-03-11 ENCOUNTER — Encounter (HOSPITAL_COMMUNITY): Payer: Self-pay

## 2018-03-11 DIAGNOSIS — I10 Essential (primary) hypertension: Secondary | ICD-10-CM

## 2018-03-11 DIAGNOSIS — I340 Nonrheumatic mitral (valve) insufficiency: Secondary | ICD-10-CM | POA: Diagnosis not present

## 2018-03-11 DIAGNOSIS — N183 Chronic kidney disease, stage 3 (moderate): Secondary | ICD-10-CM | POA: Diagnosis not present

## 2018-03-11 DIAGNOSIS — F1490 Cocaine use, unspecified, uncomplicated: Secondary | ICD-10-CM

## 2018-03-11 DIAGNOSIS — I447 Left bundle-branch block, unspecified: Secondary | ICD-10-CM

## 2018-03-11 DIAGNOSIS — Z7982 Long term (current) use of aspirin: Secondary | ICD-10-CM | POA: Insufficient documentation

## 2018-03-11 DIAGNOSIS — Z79899 Other long term (current) drug therapy: Secondary | ICD-10-CM | POA: Insufficient documentation

## 2018-03-11 DIAGNOSIS — I2510 Atherosclerotic heart disease of native coronary artery without angina pectoris: Secondary | ICD-10-CM | POA: Insufficient documentation

## 2018-03-11 DIAGNOSIS — E785 Hyperlipidemia, unspecified: Secondary | ICD-10-CM

## 2018-03-11 DIAGNOSIS — I130 Hypertensive heart and chronic kidney disease with heart failure and stage 1 through stage 4 chronic kidney disease, or unspecified chronic kidney disease: Principal | ICD-10-CM | POA: Insufficient documentation

## 2018-03-11 DIAGNOSIS — I43 Cardiomyopathy in diseases classified elsewhere: Secondary | ICD-10-CM

## 2018-03-11 DIAGNOSIS — F1410 Cocaine abuse, uncomplicated: Secondary | ICD-10-CM

## 2018-03-11 DIAGNOSIS — R0789 Other chest pain: Secondary | ICD-10-CM | POA: Diagnosis present

## 2018-03-11 DIAGNOSIS — I110 Hypertensive heart disease with heart failure: Secondary | ICD-10-CM | POA: Diagnosis not present

## 2018-03-11 DIAGNOSIS — Z8679 Personal history of other diseases of the circulatory system: Secondary | ICD-10-CM | POA: Diagnosis not present

## 2018-03-11 DIAGNOSIS — F17210 Nicotine dependence, cigarettes, uncomplicated: Secondary | ICD-10-CM | POA: Diagnosis not present

## 2018-03-11 DIAGNOSIS — R079 Chest pain, unspecified: Secondary | ICD-10-CM

## 2018-03-11 DIAGNOSIS — F1910 Other psychoactive substance abuse, uncomplicated: Secondary | ICD-10-CM | POA: Diagnosis present

## 2018-03-11 DIAGNOSIS — K219 Gastro-esophageal reflux disease without esophagitis: Secondary | ICD-10-CM | POA: Diagnosis present

## 2018-03-11 DIAGNOSIS — Z95810 Presence of automatic (implantable) cardiac defibrillator: Secondary | ICD-10-CM

## 2018-03-11 DIAGNOSIS — I5022 Chronic systolic (congestive) heart failure: Secondary | ICD-10-CM | POA: Diagnosis present

## 2018-03-11 HISTORY — DX: Chest pain, unspecified: R07.9

## 2018-03-11 LAB — BASIC METABOLIC PANEL WITH GFR
Anion gap: 12 (ref 5–15)
BUN: 10 mg/dL (ref 6–20)
CO2: 20 mmol/L — ABNORMAL LOW (ref 22–32)
Calcium: 9.1 mg/dL (ref 8.9–10.3)
Chloride: 106 mmol/L (ref 101–111)
Creatinine, Ser: 1.61 mg/dL — ABNORMAL HIGH (ref 0.61–1.24)
GFR calc Af Amer: 53 mL/min — ABNORMAL LOW
GFR calc non Af Amer: 46 mL/min — ABNORMAL LOW
Glucose, Bld: 94 mg/dL (ref 65–99)
Potassium: 3.6 mmol/L (ref 3.5–5.1)
Sodium: 138 mmol/L (ref 135–145)

## 2018-03-11 LAB — D-DIMER, QUANTITATIVE (NOT AT ARMC): D DIMER QUANT: 0.51 ug{FEU}/mL — AB (ref 0.00–0.50)

## 2018-03-11 LAB — ECHOCARDIOGRAM COMPLETE
Height: 68 in
Weight: 3680 oz

## 2018-03-11 LAB — CBC
HCT: 46.8 % (ref 39.0–52.0)
Hemoglobin: 15.3 g/dL (ref 13.0–17.0)
MCH: 26.2 pg (ref 26.0–34.0)
MCHC: 32.7 g/dL (ref 30.0–36.0)
MCV: 80 fL (ref 78.0–100.0)
Platelets: 291 10*3/uL (ref 150–400)
RBC: 5.85 MIL/uL — ABNORMAL HIGH (ref 4.22–5.81)
RDW: 15.6 % — ABNORMAL HIGH (ref 11.5–15.5)
WBC: 10.7 10*3/uL — ABNORMAL HIGH (ref 4.0–10.5)

## 2018-03-11 LAB — I-STAT TROPONIN, ED
TROPONIN I, POC: 0 ng/mL (ref 0.00–0.08)
Troponin i, poc: 0 ng/mL (ref 0.00–0.08)

## 2018-03-11 LAB — RAPID URINE DRUG SCREEN, HOSP PERFORMED
Amphetamines: NOT DETECTED
BENZODIAZEPINES: NOT DETECTED
Barbiturates: NOT DETECTED
COCAINE: POSITIVE — AB
OPIATES: NOT DETECTED
Tetrahydrocannabinol: NOT DETECTED

## 2018-03-11 LAB — LIPID PANEL
CHOL/HDL RATIO: 5 ratio
CHOLESTEROL: 175 mg/dL (ref 0–200)
HDL: 35 mg/dL — AB (ref >40)
LDL Cholesterol: 127 mg/dL — ABNORMAL HIGH (ref 0–99)
Triglycerides: 64 mg/dL (ref <150)
VLDL: 13 mg/dL (ref 0–40)

## 2018-03-11 LAB — TROPONIN I

## 2018-03-11 MED ORDER — AMLODIPINE BESYLATE 5 MG PO TABS
5.0000 mg | ORAL_TABLET | Freq: Every day | ORAL | Status: DC
  Administered 2018-03-11 – 2018-03-13 (×3): 5 mg via ORAL
  Filled 2018-03-11 (×3): qty 1

## 2018-03-11 MED ORDER — SODIUM CHLORIDE 0.9 % IV BOLUS
1000.0000 mL | Freq: Once | INTRAVENOUS | Status: AC
  Administered 2018-03-11: 1000 mL via INTRAVENOUS

## 2018-03-11 MED ORDER — IOPAMIDOL (ISOVUE-370) INJECTION 76%
100.0000 mL | Freq: Once | INTRAVENOUS | Status: AC
  Administered 2018-03-11: 100 mL via INTRAVENOUS

## 2018-03-11 MED ORDER — IOPAMIDOL (ISOVUE-370) INJECTION 76%
INTRAVENOUS | Status: AC
Start: 2018-03-11 — End: 2018-03-11
  Filled 2018-03-11: qty 100

## 2018-03-11 MED ORDER — ASPIRIN EC 81 MG PO TBEC
81.0000 mg | DELAYED_RELEASE_TABLET | Freq: Every day | ORAL | Status: DC
  Administered 2018-03-11 – 2018-03-13 (×3): 81 mg via ORAL
  Filled 2018-03-11 (×4): qty 1

## 2018-03-11 MED ORDER — SACUBITRIL-VALSARTAN 97-103 MG PO TABS
1.0000 | ORAL_TABLET | Freq: Two times a day (BID) | ORAL | Status: DC
  Administered 2018-03-11 – 2018-03-13 (×5): 1 via ORAL
  Filled 2018-03-11 (×6): qty 1

## 2018-03-11 MED ORDER — NITROGLYCERIN 0.4 MG SL SUBL
0.4000 mg | SUBLINGUAL_TABLET | SUBLINGUAL | Status: DC | PRN
  Administered 2018-03-11 (×3): 0.4 mg via SUBLINGUAL
  Filled 2018-03-11: qty 1

## 2018-03-11 MED ORDER — ISOSORBIDE MONONITRATE ER 30 MG PO TB24
30.0000 mg | ORAL_TABLET | Freq: Every day | ORAL | Status: DC
  Administered 2018-03-11 – 2018-03-13 (×3): 30 mg via ORAL
  Filled 2018-03-11 (×3): qty 1

## 2018-03-11 MED ORDER — ATORVASTATIN CALCIUM 20 MG PO TABS
20.0000 mg | ORAL_TABLET | Freq: Every day | ORAL | Status: DC
  Administered 2018-03-11 – 2018-03-12 (×2): 20 mg via ORAL
  Filled 2018-03-11 (×2): qty 1
  Filled 2018-03-11: qty 2

## 2018-03-11 MED ORDER — SPIRONOLACTONE 25 MG PO TABS
25.0000 mg | ORAL_TABLET | Freq: Every day | ORAL | Status: DC
  Administered 2018-03-11 – 2018-03-13 (×3): 25 mg via ORAL
  Filled 2018-03-11 (×3): qty 1

## 2018-03-11 MED ORDER — ACETAMINOPHEN 325 MG PO TABS
650.0000 mg | ORAL_TABLET | Freq: Four times a day (QID) | ORAL | Status: DC | PRN
  Administered 2018-03-11 – 2018-03-12 (×3): 650 mg via ORAL
  Filled 2018-03-11 (×3): qty 2

## 2018-03-11 MED ORDER — NITROGLYCERIN 0.4 MG SL SUBL: 0.4000 mg | SUBLINGUAL_TABLET | SUBLINGUAL | Status: DC | PRN

## 2018-03-11 MED ORDER — ASPIRIN 81 MG PO CHEW
324.0000 mg | CHEWABLE_TABLET | Freq: Once | ORAL | Status: AC
  Administered 2018-03-11: 324 mg via ORAL
  Filled 2018-03-11: qty 4

## 2018-03-11 MED ORDER — ENOXAPARIN SODIUM 40 MG/0.4ML ~~LOC~~ SOLN
40.0000 mg | SUBCUTANEOUS | Status: DC
  Administered 2018-03-11 – 2018-03-12 (×2): 40 mg via SUBCUTANEOUS
  Filled 2018-03-11 (×2): qty 0.4

## 2018-03-11 MED ORDER — PANTOPRAZOLE SODIUM 40 MG PO TBEC
40.0000 mg | DELAYED_RELEASE_TABLET | Freq: Every day | ORAL | Status: DC
  Administered 2018-03-11 – 2018-03-13 (×3): 40 mg via ORAL
  Filled 2018-03-11 (×3): qty 1

## 2018-03-11 NOTE — ED Notes (Signed)
Cardiology bedside for evaluation at this time

## 2018-03-11 NOTE — ED Notes (Signed)
Patient sleeping/snoring with eyes closed. No distress noted.

## 2018-03-11 NOTE — ED Notes (Signed)
Internal medicine bedside at this time 

## 2018-03-11 NOTE — ED Notes (Signed)
Heart healthy meal tray ordered for pt at this time 

## 2018-03-11 NOTE — ED Notes (Signed)
Patient has been sleeping each time RN has entered room, and had to be awakened. No distress noted. Will administer NTG tabs per PA request.

## 2018-03-11 NOTE — ED Provider Notes (Signed)
[facility] DEPARTMENT Provider Note   CSN: [id number] Arrival date & time: 03/11/18  0148     History   Chief Complaint Chief Complaint  Patient presents with  . Chest Pain    HPI Roger Crosby is a 57 y.o. male with history of CAD, heart failure with pacemaker, hypertension, hyperlipidemia, obesity, tobacco use,  cocaine use, renal insufficiency, here for evaluation of chest pain onset at midnight last night while he was laying down.  Pain was sudden, described as a pressure and ache, nonradiating, nonpleuritic, nonexertional.  Pain caused mild shortness of breath described as "took my breath away", patient was gasping for air.  Pain has been constantly dull but intermittently increases in intensity.  No aggravating factors.  No interventions PTA or modifying factors.  Had one similar episode like this in the past where he was admitted for chest pain rule out.  He has a cardiologist that he has not seen for several months due to incarceration.  Has a follow-up appointment on 4/19.  Snorted cocaine 3 days ago w/o CP.  Reports significant family stress, multiple family members have passed in the last 1 week. H/o acid reflux but this feels different. Last PCM check 02/24/18 normal, pt has not received any alarms. LHC September 2014. Low risk stress test 2016.   HPI  Past Medical History:  Diagnosis Date  . AICD (automatic cardioverter/defibrillator) present    a.  Unsuccessful LV lead placement 11/2014  . CAD (coronary artery disease)    a. nonobstructive by LHC 6/11: oOM1 40%, mild luminal irregs elsewhere b. cath 09/2014L no obstructive disease c. 03/2016: Low-risk NST  . CHF (congestive heart failure) ([place])   . CKD (chronic kidney disease)   . GERD (gastroesophageal reflux disease)   . High cholesterol   . HTN (hypertension)   . Hx of echocardiogram    echo 5/11: EF 45-50%, mild HK of Ap AS wall, ap Ant wall and true apex (LAD distribution), mild LAE  .  LBBB (left bundle branch block)   . LBP (low back pain)   . NICM (nonischemic cardiomyopathy) ([place])   . Systolic heart failure [facility])     Patient Active Problem List   Diagnosis Date Noted  . Chest pain 03/11/2018  . Substance abuse ([place]) 04/08/2016  . Atypical chest pain 04/07/2016  . CKD (chronic kidney disease) stage 3, GFR 30-59 ml/min (HCC) 04/07/2016  . S/P ICD (internal cardiac defibrillator) procedure 02/07/2015  . NICM (nonischemic cardiomyopathy) ([place]) 12/15/2014  . Chronic systolic CHF (congestive heart failure) ([place]) 08/23/2013  . DENTAL PAIN 04/04/2010  . DYSPNEA 04/04/2010  . LBBB (left bundle branch block) 02/14/2010  . SKIN TAG 02/14/2010  . LEG PAIN 02/14/2010  . HYPERLIPIDEMIA 01/07/2010  . ERECTILE DYSFUNCTION 04/09/2009  . 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 Benign essential HTN 01/28/2009  . GERD 01/28/2009  . LOW BACK PAIN 01/28/2009    Past Surgical History:  Procedure Laterality Date  . BI-VENTRICULAR IMPLANTABLE CARDIOVERTER DEFIBRILLATOR N/A 12/14/2014   with failed LV lead placeemnt  . CARDIAC CATHETERIZATION Left 07/2013   with angiogram  . DEBRIDEMENT AND CLOSURE WOUND Right 09/20/2015   Procedure: DEBRIDEMENT AND CLOSURE WOUND;  Surgeon: Dayna Barker, MD;  Location: [facility];  Service: Plastics;  Laterality: Right;  . EPICARDIAL PACING LEAD PLACEMENT N/A 02/07/2015   eicardial LV lead placed by Dr Laverta Baltimore  . LEFT HEART CATHETERIZATION WITH CORONARY ANGIOGRAM N/A 08/04/2013   Procedure: LEFT HEART CATHETERIZATION WITH  CORONARY ANGIOGRAM;  Surgeon: Larey Dresser, MD;  Location: [facility] CATH LAB;  Service: Cardiovascular;  Laterality: N/A;  . TENDON REPAIR Left 09/20/2015   Procedure: repair nerve tendon wrist;  Surgeon: Dayna Barker, MD;  Location: [place];  Service: Plastics;  Laterality: Left;  . THORACOTOMY Left 02/07/2015   Procedure: THORACOTOMY MAJOR;  Surgeon: Gaye Pollack, MD;  Location: [facility] OR;  Service: Thoracic;  Laterality:  Left;        Home Medications    Prior to Admission medications   Medication Sig Start Date End Date Taking? Authorizing Provider  aspirin EC 81 MG tablet Take 1 tablet (81 mg total) by mouth daily. 08/29/12  Yes Weaver, Scott T, PA-C  atorvastatin (LIPITOR) 20 MG tablet Take 1 tablet (20 mg total) by mouth daily at 6 PM. Patient taking differently: Take 20 mg by mouth every morning.  04/08/16  Yes Strader, [place], PA-C  carvedilol (COREG) 25 MG tablet TAKE 1 TABLET (25 MG TOTAL) BY MOUTH 2 (TWO) TIMES DAILY WITH A MEAL. 01/18/17  Yes Bensimhon, Shaune Pascal, MD  ENTRESTO 97-103 MG TAKE 1 TABLET BY MOUTH 2 (TWO) TIMES DAILY. 01/20/17  Yes Bensimhon, Shaune Pascal, MD  furosemide (LASIX) 20 MG tablet Take 1 tablet (20 mg total) by mouth daily. 12/27/14  Yes Bensimhon, Shaune Pascal, MD  naproxen (NAPROSYN) 500 MG tablet Take 1 tablet (500 mg total) by mouth 2 (two) times daily. Patient taking differently: Take 500 mg by mouth 2 (two) times daily as needed for mild pain.  01/21/18  Yes Glyn Ade, PA-C  omeprazole (PRILOSEC) 20 MG capsule TAKE 1 CAPSULE (20 MG TOTAL) BY MOUTH DAILY. 08/04/16  Yes Bensimhon, Shaune Pascal, MD  spironolactone (ALDACTONE) 25 MG tablet TAKE 1 TABLET (25 MG TOTAL) BY MOUTH DAILY. 12/30/15  Yes Bensimhon, Shaune Pascal, MD    Family History Family History  Problem Relation Age of Onset  . Heart attack Mother        in 39's  . Heart disease Mother        ischemic  . Congestive Heart Failure Mother   . Diabetes Mother   . Hypertension Mother   . Heart attack Father        in 52's  . Heart disease Father        ischemic  . Heart Problems Father   . Crohn's disease Sister   . Prostate cancer Brother   . HIV/AIDS Brother     Social History Social History   Tobacco Use  . Smoking status: Current Every Day Smoker    Packs/day: 0.12    Years: 25.00    Pack years: 3.00    Types: Cigarettes  . Smokeless tobacco: Never Used  Substance Use Topics  . Alcohol use: Yes     Comment: social  . Drug use: No    Types: "Crack" cocaine    Comment: 12/14/2014 "last crack was in 2014"     Allergies   Patient has no known allergies.   Review of Systems Review of Systems  Respiratory: Positive for shortness of breath.   Cardiovascular: Positive for chest pain.  All other systems reviewed and are negative.    Physical Exam Updated Vital Signs BP 126/70   Pulse 81   Temp 98.6 F (37 C) (Oral)   Resp (!) 21   Ht 5\' 8"  (1.727 m)   Wt 104.3 kg (230 lb)   SpO2 93%   BMI 34.97 kg/m   Physical  Exam  Constitutional: He appears well-developed and well-nourished.  NAD. Non toxic.   HENT:  Head: Normocephalic and atraumatic.  Nose: Nose normal.  Moist mucous membranes. Tonsils and oropharynx normal  Eyes: Conjunctivae, EOM and lids are normal.  Neck: Trachea normal and normal range of motion.  Neck is supple. Trachea midline. No cervical adenopathy  Cardiovascular: Normal rate, regular rhythm, S1 normal, S2 normal and normal heart sounds.  Pulses:      Carotid pulses are 2+ on the right side, and 2+ on the left side.      Radial pulses are 2+ on the right side, and 2+ on the left side.       Dorsalis pedis pulses are 2+ on the right side, and 2+ on the left side.  RRR. No LE edema or calf tenderness.   Pulmonary/Chest: Effort normal and breath sounds normal. No respiratory distress. He has no decreased breath sounds. He has no rhonchi.  No reproducible chest wall tenderness. CP not reproducible with AROM of upper extremities. No rales or wheezing.  Abdominal: Soft. Bowel sounds are normal. There is tenderness.  Mild epigastric tenderness with deep palpation. No distention.   Neurological: He is alert. GCS eye subscore is 4. GCS verbal subscore is 5. GCS motor subscore is 6.  Skin: Skin is warm and dry. Capillary refill takes less than 2 seconds.  No rash to chest wall  Psychiatric: He has a normal mood and affect. His speech is normal and behavior is  normal. Judgment and thought content normal. Cognition and memory are normal.     ED Treatments / Results  Labs (all labs ordered are listed, but only abnormal results are displayed) Labs Reviewed  BASIC METABOLIC PANEL - Abnormal; Notable for the following components:      Result Value   CO2 20 (*)    Creatinine, Ser 1.61 (*)    GFR calc non Af Amer 46 (*)    GFR calc Af Amer 53 (*)    All other components within normal limits  CBC - Abnormal; Notable for the following components:   WBC 10.7 (*)    RBC 5.85 (*)    RDW 15.6 (*)    All other components within normal limits  D-DIMER, QUANTITATIVE (NOT AT [facility]) - Abnormal; Notable for the following components:   D-Dimer, Quant 0.51 (*)    All other components within normal limits  RAPID URINE DRUG SCREEN, HOSP PERFORMED - Abnormal; Notable for the following components:   Cocaine POSITIVE (*)    All other components within normal limits  I-STAT TROPONIN, ED  I-STAT TROPONIN, ED    EKG EKG Interpretation  Date/Time:  Friday March 11 2018 06:30:59 EDT Ventricular Rate:  70 PR Interval:    QRS Duration: 116 QT Interval:  421 QTC Calculation: 455 R Axis:   17 Text Interpretation:  Sinus rhythm Incomplete left bundle branch block Consider anterior infarct slight ST depression inferiorly Confirmed by Malvin Johns [phone]) on 03/11/2018 6:34:55 AM   Radiology Dg Chest 2 View  Result Date: 03/11/2018 CLINICAL DATA:  Chest pain.  Dizziness. EXAM: CHEST - 2 VIEW COMPARISON:  06/09/2016 FINDINGS: Left-sided pacemaker in place with leads projecting over the right atrium and ventricle. Additional leads project over the left ventricle. The heart is normal in size. Normal mediastinal contours. No pulmonary edema. No consolidation, pleural effusion or pneumothorax. No acute osseous abnormalities. IMPRESSION: No acute findings. Electronically Signed   By: Jeb Levering M.D.   On: 03/11/2018  03:01   Ct Angio Chest Pe W And/or Wo  Contrast  Result Date: 03/11/2018 CLINICAL DATA:  Chest pain and shortness of breath. History of congestive heart failure. Positive D-dimer. Evaluate for pulmonary embolism. EXAM: CT ANGIOGRAPHY CHEST WITH CONTRAST TECHNIQUE: Multidetector CT imaging of the chest was performed using the standard protocol during bolus administration of intravenous contrast. Multiplanar CT image reconstructions and MIPs were obtained to evaluate the vascular anatomy. CONTRAST:  186mL ISOVUE-370 IOPAMIDOL (ISOVUE-370) INJECTION 76% COMPARISON:  Chest radiograph-earlier same day; 06/09/2016; 04/06/2016 FINDINGS: Vascular Findings: There is adequate opacification of the pulmonary arterial system with the main pulmonary artery measuring 309 Hounsfield units. There are no discrete filling defects within the pulmonary arterial tree to suggest pulmonary embolism. Mild dilatation of the main pulmonary artery measuring 3.3 cm in diameter. Cardiomegaly. Post dual lead AICD/pacemaker with additional leads about the lateral wall of the left ventricle with one of these leads coursing through the caudal aspect of the lingula (image 60, series 5), unchanged when compared to remote chest radiograph compared /06/2016. Minimal amount of atherosclerotic plaque with a normal caliber thoracic aorta. No definite thoracic aortic dissection or periaortic stranding on this nongated examination. Conventional configuration of the aortic arch. The branch vessels of the aortic arch appear patent throughout their imaged course. Review of the MIP images confirms the above findings. ---------------------------------------------------------------------------------- Nonvascular Findings: Mediastinum/Lymph Nodes: No bulky mediastinal, axillary or hilar lymphadenopathy. Lungs/Pleura: Minimal dependent subpleural ground-glass atelectasis. There is focal invagination of the intercostal fat between the anterior aspects of the left second and third ribs measuring  approximately 4.2 x 1.5 x 2.6 cm (axial image 34, series 5, sagittal image 116, series 9), without associated adjacent osseous or pulmonary abnormality, favored to represent a benign lipoma. Minimal grossly symmetric biapical pleuroparenchymal thickening. No discrete focal airspace opacities. No pleural effusion or pneumothorax. The central pulmonary airways appear widely patent. No discrete pulmonary nodules. Upper abdomen: Limited early arterial phase evaluation of the upper abdomen is normal. Musculoskeletal: No acute or aggressive osseous abnormalities. Degenerative change of the lower lumbar spine. IMPRESSION: 1. No acute cardiopulmonary disease. Specifically, no evidence of pulmonary embolism. 2. Cardiomegaly with enlargement of the caliber of the main pulmonary artery, nonspecific though could be seen in the setting of pulmonary arterial hypertension. 3.  Aortic Atherosclerosis (ICD10-I70.0). Electronically Signed   By: Sandi Mariscal M.D.   On: 03/11/2018 08:52    Procedures Procedures (including critical care time)  Medications Ordered in ED Medications  nitroGLYCERIN (NITROSTAT) SL tablet 0.4 mg (0.4 mg Sublingual Given 03/11/18 0944)  iopamidol (ISOVUE-370) 76 % injection (has no administration in time range)  sodium chloride 0.9 % bolus 1,000 mL (1,000 mLs Intravenous New Bag/Given 03/11/18 6301)  aspirin chewable tablet 324 mg (324 mg Oral Given 03/11/18 0907)  iopamidol (ISOVUE-370) 76 % injection 100 mL (100 mLs Intravenous Contrast Given 03/11/18 0820)     Initial Impression / Assessment and Plan / ED Course  I have reviewed the triage vital signs and the nursing notes.  Pertinent labs & imaging results that were available during my care of the patient were reviewed by me and considered in my medical decision making (see chart for details).  Clinical Course as of Mar 11 1009  Fri Mar 11, 2018  0606 Creatinine(!): 1.61 [CG]  0606 WBC(!): 10.7 [CG]  0606 GFR, Est African American(!):  53 [CG]  0756 Reevaluated patient, continues to endorse chest pressure.  RN to administer nitroglycerin.  Awaiting cardiology consult.   [CG]  0917 IMPRESSION: 1. No acute cardiopulmonary disease. Specifically, no evidence of pulmonary embolism. 2. Cardiomegaly with enlargement of the caliber of the main pulmonary artery, nonspecific though could be seen in the setting of pulmonary arterial hypertension. 3. Aortic Atherosclerosis (ICD10-I70.0).  CT Angio Chest PE W and/or Wo Contrast [CG]    Clinical Course User Index [CG] Kinnie Feil, PA-C   57 yo with CP, high HEART score. EKG with slight ST depression changed from previous. Will obtain delta trop and d-dimer as suspicion for PE is low however initially tachycardic at check in.   0750: Continues to have CP, will start nitro SL. Cardiologist Dr. Haroldine Laws. Age adjusted d-dimer WNL. Pending cardiology consult.   38: Spoke to cardiology who will see patient, they agree with changes to EKG although CP sounds atypical. Continue with CTA PE, likely need admission to meidcine w/ cards consulting.  Final Clinical Impressions(s) / ED Diagnoses   0930: CTA negative for PE. Will consult unassigned for admission for CP r/o. Cardiology team to see pt.  Final diagnoses:  None    ED Discharge Orders    None       Arlean Hopping 03/11/18 1010    Malvin Johns, MD 03/11/18 2239

## 2018-03-11 NOTE — ED Notes (Signed)
Pt now c/o of dizziness

## 2018-03-11 NOTE — H&P (Signed)
Date: 03/11/2018               Patient Name:  Roger Crosby MRN: [id number]  DOB: 04-Apr-1961 Age / Sex: 57 y.o., male   PCP: Patient, No Pcp Per         Medical Service: Internal Medicine Teaching Service         Attending Physician: Dr. Evette Doffing, Mallie Mussel, *    First Contact: Dr. Berline Lopes Pager: [phone]  Second Contact: Dr. Hetty Ely Pager: [phone]       After Hours (After 5p/  First Contact Pager: [phone]  weekends / holidays): Second Contact Pager: [phone]   Chief Complaint: CP  History of Present Illness: Patient is a 57 year old male with a past medical history of hypertension, hyperlipidemia, chronic systolic congestive heart failure, nonischemic cardiomyopathy, nonobstructive coronary artery disease, stage III chronic kidney disease, cocaine use presenting to the hospital with a chief complaint of chest pain. Reports having substernal chest pressure at rest last night around midnight.  States the chest pain has been constant since then. It was initially 9 out of 10 in severity and at present it is 5-6 out of 10.  It is nonradiating.  Associated with shortness of breath and dizziness.  Staying still makes the pain better.  No exacerbating factors.  Denies having any fevers, chills, abdominal pain, nausea, or vomiting.  Denies having any orthopnea or lower extremity edema.  Denies any history of blood clots.  Denies having any calf swelling, erythema, or pain.  Denies history of falls or trauma to the chest area.  States the sublingual nitroglycerin he received in the ED help with the pain.  Per patient, he was previously followed by cardiology but has not been able to see any doctors as he was incarcerated for 13 months until January 2019.  Reports snorting cocaine 3 days ago.  In the ED, hemodynamically stable.   I-STAT troponin x 2 negative.  EKG with new ST segment depressions in inferolateral leads. Afebrile.  White count mildly elevated at 10.7.  Chest x-ray without any acute  cardiopulmonary abnormality.  Age-adjusted d-dimer within normal range.  CTA chest done in the ED negative for PE. Urine drug screen positive for cocaine.  He received aspirin 324 mg and 3 doses of sublingual nitroglycerin. Cardiology was consulted by ED provider.   Meds:  Current Meds  Medication Sig  . aspirin EC 81 MG tablet Take 1 tablet (81 mg total) by mouth daily.  Marland Kitchen atorvastatin (LIPITOR) 20 MG tablet Take 1 tablet (20 mg total) by mouth daily at 6 PM. (Patient taking differently: Take 20 mg by mouth every morning. )  . carvedilol (COREG) 25 MG tablet TAKE 1 TABLET (25 MG TOTAL) BY MOUTH 2 (TWO) TIMES DAILY WITH A MEAL.  Marland Kitchen ENTRESTO 97-103 MG TAKE 1 TABLET BY MOUTH 2 (TWO) TIMES DAILY.  . furosemide (LASIX) 20 MG tablet Take 1 tablet (20 mg total) by mouth daily.  . naproxen (NAPROSYN) 500 MG tablet Take 1 tablet (500 mg total) by mouth 2 (two) times daily. (Patient taking differently: Take 500 mg by mouth 2 (two) times daily as needed for mild pain. )  . omeprazole (PRILOSEC) 20 MG capsule TAKE 1 CAPSULE (20 MG TOTAL) BY MOUTH DAILY.  Marland Kitchen spironolactone (ALDACTONE) 25 MG tablet TAKE 1 TABLET (25 MG TOTAL) BY MOUTH DAILY.     Allergies: Allergies as of 03/11/2018  . (No Known Allergies)   Past Medical History:  Diagnosis Date  .  AICD (automatic cardioverter/defibrillator) present    a.  Unsuccessful LV lead placement 11/2014  . CAD (coronary artery disease)    a. nonobstructive by LHC 6/11: oOM1 40%, mild luminal irregs elsewhere b. cath 09/2014L no obstructive disease c. 03/2016: Low-risk NST  . CHF (congestive heart failure) ([place])   . CKD (chronic kidney disease)   . GERD (gastroesophageal reflux disease)   . High cholesterol   . HTN (hypertension)   . Hx of echocardiogram    echo 5/11: EF 45-50%, mild HK of Ap AS wall, ap Ant wall and true apex (LAD distribution), mild LAE  . LBBB (left bundle branch block)   . LBP (low back pain)   . NICM (nonischemic cardiomyopathy) ([place])    . Systolic heart failure (HCC)     Family History: Both mother and father with hypertension and heart disease.  Social History: He was previously smoking 1 pack of cigarettes every 4 days for 20 years; quit 6 years ago.  Drinks wine occasionally.  Snorted cocaine 3 days ago.  Review of Systems: A complete ROS was negative except as per HPI.  Physical Exam: Blood pressure 126/70, pulse 81, temperature 98.6 F (37 C), temperature source Oral, resp. rate (!) 21, height 5\' 8"  (1.727 m), weight 230 lb (104.3 kg), SpO2 93 %. Physical Exam  Constitutional: He is oriented to person, place, and time. He appears well-developed and well-nourished. No distress.  Resting comfortably in hospital stretcher watching television  HENT:  Head: Normocephalic and atraumatic.  Mouth/Throat: Oropharynx is clear and moist.  Eyes: Right eye exhibits no discharge. Left eye exhibits no discharge.  Cardiovascular: Normal rate, regular rhythm and intact distal pulses. Exam reveals no gallop and no friction rub.  Pulmonary/Chest: Effort normal and breath sounds normal. No respiratory distress. He has no wheezes. He has no rales.  Abdominal: Soft. Bowel sounds are normal. He exhibits no distension. There is no tenderness.  Musculoskeletal: He exhibits no edema.  Chest wall tender to palpation.  Neurological: He is alert and oriented to person, place, and time.  Skin: Skin is warm and dry.    EKG: personally reviewed my interpretation is sinus rhythm with heart rate 100 and incomplete left bundle branch block. ST segment depressions in leads II, aVF, V5, and V6 which appeared to be new compared to prior EKG.  CXR: personally reviewed my interpretation is no pulmonary edema, consolidation, or pleural effusion.   Assessment & Plan by Problem: Active Problems:   Chest pain  57 year old male with a past medical history of hypertension, hyperlipidemia, chronic systolic congestive heart failure, nonischemic  cardiomyopathy, nonobstructive coronary artery disease, stage III chronic kidney disease, cocaine use presenting to the hospital with a chief complaint of chest pain.  Chest pain: Hemodynamically stable.   I-STAT troponin x 2 negative.  EKG with new ST segment depressions in inferolateral leads. Urine drug screen positive for cocaine. Myocardial ischemia likely due to coronary vasospasm in the setting of cocaine use.  Mild leukocytosis is likely a stress response.  He is afebrile and there is no evidence of pneumonia on chest x-ray.  CTA chest done in the ED negative for PE. He has a history of nonobstructive coronary artery disease.  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  He was admitted in May 2017 with a complaint of chest pain.  Nuclear stress test done at that time was a low risk study. -Admit to telemetry -Aspirin -Sublingual nitroglycerin as needed -Trend  troponin -Avoid beta-blocker in the setting of cocaine use -Counseled on drug cessation -Cardiology recs pending  Chronic systolic congestive heart failure secondary to nonischemic cardiomyopathy s/p ICD : Echo done in November 2015 showing EF 25-30%.  Status post [employer] Jude BiV in 01/2015. Last pacemaker check was in February 24, 2018.  EF 50% on stress test done in May 2017.  Currently euvolemic on exam. Takes carvedilol, spironolactone, and Entresto at home. -Hold home beta blocker  -Continue home spironolactone -Continue home Entresto  Hypertension: Systolic ranging from 540G-867Y. -Continue home spironolactone -Continue home Entresto -Hold home beta-blocker  Hyperlipidemia: Currently taking moderate intensity statin at home. -Repeat lipid panel -Continue home statin  Stage III chronic kidney disease: Creatinine 1.6 and GFR 53.  Renal function at baseline. -Continue to monitor  GERD -Continue PPI  Diet: Heart healthy  Prophylaxis: Lovenox  CODE STATUS: Patient wishes to be full  code.  Dispo: Admit patient to Observation with expected length of stay less than 2 midnights.  Signed: Shela Leff, MD 03/11/2018, 10:13 AM  Pager: [phone]

## 2018-03-11 NOTE — Consult Note (Addendum)
Cardiology Consultation:   Patient ID: Roger Crosby; [id number]; 03-24-1961   Admit date: 03/11/2018 Date of Consult: 03/11/2018  Primary Care Provider: Patient, No Pcp Per Primary Cardiologist: Dr. Haroldine Laws  Primary Electrophysiologist: Dr. Caryl Comes   Patient Profile:   Roger Crosby is a 57 y.o. male with a hx of nonischemic cardiomyopathy (EF 25-30%, s/p St. 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 Marlowe Sax.   History of Present Illness:   Roger Crosby is a 57yo M with a hx as stated above who presented to [place] on 03/11/18 with complaints of mid-sternal chest pain with upper back radiation (rated a 9/10 in severity) which woke him from his sleep early this AM at approximately 12am. During that time, he reports associated SOB and mild dizziness. He states that he has never had chest discomfort like this before. He did not take any medications for the pain. He does admit to using cocaine approximately 3 days ago, but was not doing cocaine at the time of his pain. Denies any recent orthopnea, PND or lower extremity edema. He reports good compliance with his cardiac medications. Reports a stable weight of approximately 230 lbs, however does not weigh himself on a regular basis. Given his known cardiac history, he reported to the ED for further evaluation.  While in the ED, his initial troponin values have been negative (0.01>0.00>0.00). BMET shows a stable creatinine of 1.61 (baseline appears to be in the 1.5 range). WBC 10.7. Hgb 15.3. CXR with no acute pulmonary edema or cardiopulmonary disease noted. His initial EKG on 03/11/18 with possible ST depression in inferolateral leads. Repeat EKG with artifact, but ST depression remains.This is changed from previous tracing from 01/21/18.   Of note, the patient has not been seen in the  office since 2016 with Dr. Caryl Comes. Last echocardiogram from 2015 with LVEF of 25-30% with subsequent BiV-AICD implanted in 2016. He had a low risk stress test on 04/08/16. He has not had regular cardiology follow up secondary to being incarcerated for approximately 1.5 years. 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   He continues to have 5/10 chest pain during our encounter. He reports that his SOB and dizziness have subsided.   He has a scheduled appointment with Dr. Aundra Dubin on 03/18/18.  Reports a strong family history of CAD with both of his parents having MI's (mom in her 86's, father in his 5's).   Past Medical History:  Diagnosis Date  . AICD (automatic cardioverter/defibrillator) present    a.  Unsuccessful LV lead placement 11/2014  . CAD (coronary artery disease)    a. nonobstructive by LHC 6/11: oOM1 40%, mild luminal irregs elsewhere b. cath 09/2014L no obstructive disease c. 03/2016: Low-risk NST  . CHF (congestive heart failure) ([place])   . CKD (chronic kidney disease)   . GERD (gastroesophageal reflux disease)   . High cholesterol   . HTN (hypertension)   . Hx of echocardiogram    echo 5/11: EF 45-50%, mild HK of Ap AS wall, ap Ant wall and true apex (LAD distribution), mild LAE  . LBBB (left bundle branch block)   . LBP (low back pain)   . NICM (nonischemic cardiomyopathy) ([place])   . Systolic heart failure [facility])     Past Surgical  History:  Procedure Laterality Date  . BI-VENTRICULAR IMPLANTABLE CARDIOVERTER DEFIBRILLATOR N/A 12/14/2014   with failed LV lead placeemnt  . CARDIAC CATHETERIZATION Left 07/2013   with angiogram  . DEBRIDEMENT AND CLOSURE WOUND Right 09/20/2015   Procedure: DEBRIDEMENT AND CLOSURE WOUND;  Surgeon: Dayna Barker, MD;  Location: [place];  Service: Plastics;  Laterality: Right;  . EPICARDIAL PACING LEAD PLACEMENT N/A 02/07/2015   eicardial LV lead placed by Dr Laverta Baltimore  . LEFT HEART CATHETERIZATION WITH  CORONARY ANGIOGRAM N/A 08/04/2013   Procedure: LEFT HEART CATHETERIZATION WITH CORONARY ANGIOGRAM;  Surgeon: Larey Dresser, MD;  Location: [facility] CATH LAB;  Service: Cardiovascular;  Laterality: N/A;  . TENDON REPAIR Left 09/20/2015   Procedure: repair nerve tendon wrist;  Surgeon: Dayna Barker, MD;  Location: [place];  Service: Plastics;  Laterality: Left;  . THORACOTOMY Left 02/07/2015   Procedure: THORACOTOMY MAJOR;  Surgeon: Gaye Pollack, MD;  Location[facility] OR;  Service: Thoracic;  Laterality: Left;     Prior to Admission medications   Medication Sig Start Date End Date Taking? Authorizing Provider  aspirin EC 81 MG tablet Take 1 tablet (81 mg total) by mouth daily. 08/29/12  Yes Weaver, Scott T, PA-C  atorvastatin (LIPITOR) 20 MG tablet Take 1 tablet (20 mg total) by mouth daily at 6 PM. Patient taking differently: Take 20 mg by mouth every morning.  04/08/16  Yes Strader, [place], PA-C  carvedilol (COREG) 25 MG tablet TAKE 1 TABLET (25 MG TOTAL) BY MOUTH 2 (TWO) TIMES DAILY WITH A MEAL. 01/18/17  Yes Bensimhon, Shaune Pascal, MD  ENTRESTO 97-103 MG TAKE 1 TABLET BY MOUTH 2 (TWO) TIMES DAILY. 01/20/17  Yes Bensimhon, Shaune Pascal, MD  furosemide (LASIX) 20 MG tablet Take 1 tablet (20 mg total) by mouth daily. 12/27/14  Yes Bensimhon, Shaune Pascal, MD  naproxen (NAPROSYN) 500 MG tablet Take 1 tablet (500 mg total) by mouth 2 (two) times daily. Patient taking differently: Take 500 mg by mouth 2 (two) times daily as needed for mild pain.  01/21/18  Yes Glyn Ade, PA-C  omeprazole (PRILOSEC) 20 MG capsule TAKE 1 CAPSULE (20 MG TOTAL) BY MOUTH DAILY. 08/04/16  Yes Bensimhon, Shaune Pascal, MD  spironolactone (ALDACTONE) 25 MG tablet TAKE 1 TABLET (25 MG TOTAL) BY MOUTH DAILY. 12/30/15  Yes Bensimhon, Shaune Pascal, MD    Inpatient Medications: Scheduled Meds: . iopamidol       Continuous Infusions:  PRN Meds: nitroGLYCERIN  Allergies:   No Known Allergies  Social History:   Social History   Socioeconomic  History  . Marital status: Married    Spouse name: Not on file  . Number of children: Not on file  . Years of education: Not on file  . Highest education level: Not on file  Occupational History  . Not on file  Social Needs  . Financial resource strain: Not on file  . Food insecurity:    Worry: Not on file    Inability: Not on file  . Transportation needs:    Medical: Not on file    Non-medical: Not on file  Tobacco Use  . Smoking status: Current Every Day Smoker    Packs/day: 0.12    Years: 25.00    Pack years: 3.00    Types: Cigarettes  . Smokeless tobacco: Never Used  Substance and Sexual Activity  . Alcohol use: Yes    Comment: social  . Drug use: No    Types: "Crack" cocaine  Comment: 12/14/2014 "last crack was in 2014"  . Sexual activity: Yes  Lifestyle  . Physical activity:    Days per week: Not on file    Minutes per session: Not on file  . Stress: Not on file  Relationships  . Social connections:    Talks on phone: Not on file    Gets together: Not on file    Attends religious service: Not on file    Active member of club or organization: Not on file    Attends meetings of clubs or organizations: Not on file    Relationship status: Not on file  . Intimate partner violence:    Fear of current or ex partner: Not on file    Emotionally abused: Not on file    Physically abused: Not on file    Forced sexual activity: Not on file  Other Topics Concern  . Not on file  Social History Narrative  . Not on file    Family History:   Family History  Problem Relation Age of Onset  . Heart attack Mother        in 62's  . Heart disease Mother        ischemic  . Congestive Heart Failure Mother   . Diabetes Mother   . Hypertension Mother   . Heart attack Father        in 22's  . Heart disease Father        ischemic  . Heart Problems Father   . Crohn's disease Sister   . Prostate cancer Brother   . HIV/AIDS Brother    Family Status:  Family Status    Relation Name Status  . Mother  Deceased at age 78  . Father  Deceased at age 31's  . Sister 1/2 (Not Specified)  . Brother  (Not Specified)  . Brother  (Not Specified)    ROS:  Please see the history of present illness.  All other ROS reviewed and negative.     Physical Exam/Data:   Vitals:   03/11/18 1000 03/11/18 1030 03/11/18 1100 03/11/18 1200  BP: 120/73 121/74 118/66 (!) 137/93  Pulse: 73 75 70 69  Resp: 13 16 17  (!) 21  Temp:      TempSrc:      SpO2: 95% 96% 92% 96%  Weight:      Height:        Intake/Output Summary (Last 24 hours) at 03/11/2018 1253 Last data filed at 03/11/2018 1127 Gross per 24 hour  Intake 1000 ml  Output -  Net 1000 ml   Filed Weights   03/11/18 0153  Weight: 230 lb (104.3 kg)   Body mass index is 34.97 kg/m.   General: Well developed, well nourished, NAD  Skin: Warm, dry, intact  Head: Normocephalic, atraumatic, clear, moist mucus membranes. Neck: Negative for carotid bruits. No JVD Lungs:Clear to ausculation bilaterally. No wheezes, rales, or rhonchi. Breathing is unlabored. Cardiovascular: RRR with S1 S2. No murmurs, rubs, or gallops Abdomen: Soft, non-tender, non-distended with normoactive bowel sounds.  No obvious abdominal masses. MSK: Strength and tone appear normal for age. 5/5 in all extremities Extremities: No edema. No clubbing or cyanosis. DP/PT pulses 2+ bilaterally Neuro: Alert and oriented. No focal deficits. No facial asymmetry. MAE spontaneously. Psych: Responds to questions appropriately with normal affect.     EKG:  The EKG was personally reviewed and demonstrates: 03/11/18 NSR with inferolateral ST changes Telemetry:  Telemetry was personally reviewed and demonstrates: 03/11/18 NSR  Relevant CV Studies:  ECHO: 10/24/14 Study Conclusions  - Left ventricle: The cavity size was moderately dilated. Systolic function was severely reduced. The estimated ejection fraction was in the range of 25% to 30%.  Diffuse hypokinesis. There is akinesis of the anteroseptal and inferoseptal myocardium. Doppler parameters are consistent with abnormal left ventricular relaxation (grade 1 diastolic dysfunction). - Mitral valve: There was mild regurgitation. - Left atrium: The atrium was moderately dilated.  Impressions:  - Compared to the prior study, there has been no significant interval change.  CATH: 08/04/13   Final Conclusions:  Nonischemic cardiomyopathy.  Elevated LVEDP.   - Increase Lasix to 40 mg daily and add spironolactone 12.5 mg daily.  BMET in 1 week. - Continue Coreg and lisinopril.  - Followup in office in 2 wks.  If no improvement in LV function with repeat echo in 12/15, will need CRT-D (has LBBB).  Aggressive medical management of cardiomyopathy until that time.    Stress study: 04/08/16  There was no ST segment deviation noted during stress.  Defect 1: There is a medium defect of moderate severity.  This is a low risk study.  Nuclear stress EF: 50%.   No T wave inversion was noted during stress.   Small, mild fixed anteroseptal LBBB artifact. Small mild fixed inferior/inferolateral attenuation artifact. No significant reversible ischemia. LVEF 50% with septal dyskinesis from LBBB. This is a low risk study.  Laboratory Data:  Chemistry Recent Labs  Lab 03/11/18 0200  NA 138  K 3.6  CL 106  CO2 20*  GLUCOSE 94  BUN 10  CREATININE 1.61*  CALCIUM 9.1  GFRNONAA 46*  GFRAA 53*  ANIONGAP 12    Total Protein  Date Value Ref Range Status  04/12/2016 5.9 (L) 6.5 - 8.1 g/dL Final   Albumin  Date Value Ref Range Status  04/12/2016 3.2 (L) 3.5 - 5.0 g/dL Final   AST  Date Value Ref Range Status  04/12/2016 20 15 - 41 U/L Final   ALT  Date Value Ref Range Status  04/12/2016 23 17 - 63 U/L Final   Alkaline Phosphatase  Date Value Ref Range Status  04/12/2016 63 38 - 126 U/L Final   Total Bilirubin  Date Value Ref Range Status  04/12/2016 0.6  0.3 - 1.2 mg/dL Final   Hematology Recent Labs  Lab 03/11/18 0200  WBC 10.7*  RBC 5.85*  HGB 15.3  HCT 46.8  MCV 80.0  MCH 26.2  MCHC 32.7  RDW 15.6*  PLT 291   Cardiac EnzymesNo results for input(s): TROPONINI in the last 168 hours.  Recent Labs  Lab 03/11/18 0236 03/11/18 0658  TROPIPOC 0.00 0.00    BNPNo results for input(s): BNP, PROBNP in the last 168 hours.  DDimer  Recent Labs  Lab 03/11/18 0202  DDIMER 0.51*   TSH:  Lab Results  Component Value Date   TSH 2.358 01/28/2009   Lipids: Lab Results  Component Value Date   CHOL 171 04/08/2016   HDL 32 (L) 04/08/2016   LDLCALC 120 (H) 04/08/2016   LDLDIRECT 155.3 04/24/2010   TRIG 94 04/08/2016   CHOLHDL 5.3 04/08/2016   HgbA1c:No results found for: HGBA1C  Radiology/Studies:  Dg Chest 2 View  Result Date: 03/11/2018 CLINICAL DATA:  Chest pain.  Dizziness. EXAM: CHEST - 2 VIEW COMPARISON:  06/09/2016 FINDINGS: Left-sided pacemaker in place with leads projecting over the right atrium and ventricle. Additional leads project over the left ventricle. The heart is normal in size.  Normal mediastinal contours. No pulmonary edema. No consolidation, pleural effusion or pneumothorax. No acute osseous abnormalities. IMPRESSION: No acute findings. Electronically Signed   By: Jeb Levering M.D.   On: 03/11/2018 03:01   Ct Angio Chest Pe W And/or Wo Contrast  Result Date: 03/11/2018 CLINICAL DATA:  Chest pain and shortness of breath. History of congestive heart failure. Positive D-dimer. Evaluate for pulmonary embolism. EXAM: CT ANGIOGRAPHY CHEST WITH CONTRAST TECHNIQUE: Multidetector CT imaging of the chest was performed using the standard protocol during bolus administration of intravenous contrast. Multiplanar CT image reconstructions and MIPs were obtained to evaluate the vascular anatomy. CONTRAST:  122mL ISOVUE-370 IOPAMIDOL (ISOVUE-370) INJECTION 76% COMPARISON:  Chest radiograph-earlier same day; 06/09/2016;  04/06/2016 FINDINGS: Vascular Findings: There is adequate opacification of the pulmonary arterial system with the main pulmonary artery measuring 309 Hounsfield units. There are no discrete filling defects within the pulmonary arterial tree to suggest pulmonary embolism. Mild dilatation of the main pulmonary artery measuring 3.3 cm in diameter. Cardiomegaly. Post dual lead AICD/pacemaker with additional leads about the lateral wall of the left ventricle with one of these leads coursing through the caudal aspect of the lingula (image 60, series 5), unchanged when compared to remote chest radiograph compared /06/2016. Minimal amount of atherosclerotic plaque with a normal caliber thoracic aorta. No definite thoracic aortic dissection or periaortic stranding on this nongated examination. Conventional configuration of the aortic arch. The branch vessels of the aortic arch appear patent throughout their imaged course. Review of the MIP images confirms the above findings. ---------------------------------------------------------------------------------- Nonvascular Findings: Mediastinum/Lymph Nodes: No bulky mediastinal, axillary or hilar lymphadenopathy. Lungs/Pleura: Minimal dependent subpleural ground-glass atelectasis. 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 Minimal grossly symmetric biapical pleuroparenchymal thickening. No discrete focal airspace opacities. No pleural effusion or pneumothorax. The central pulmonary airways appear widely patent. No discrete pulmonary nodules. Upper abdomen: Limited early arterial phase evaluation of the upper abdomen is normal. Musculoskeletal: No acute or aggressive osseous abnormalities. Degenerative change of  the lower lumbar spine. IMPRESSION: 1. No acute cardiopulmonary disease. Specifically, no evidence of pulmonary embolism. 2. Cardiomegaly with enlargement of the caliber of the main pulmonary artery, nonspecific though could be seen in the setting of pulmonary arterial hypertension. 3.  Aortic Atherosclerosis (ICD10-I70.0). Electronically Signed   By: Sandi Mariscal M.D.   On: 03/11/2018 08:52   Assessment and Plan:   1.Chest pain likely cocaine induced: -EKG with new ST segment depressions in inferolateral leads (different from 12/2017 tracing). Urine drug screen positive for cocaine. Myocardial ischemia likely due to coronary vasospasm in the setting of cocaine use. However, given that he is having ongoing chest discomfort, will schedule for repeat Myoview stress tomorrow -Will order echocardiogram given that he has not had a repeat study since BiV placement. No current s/s of failure  -Will add low dose amlodipine and IMDUR given that BB is being held for cocaine use -Trop, 0.01>0.0>0.0 -CTA negative from PE  -UDS positive for cocaine  -Avoid BB in the setting of cocaine use -ASA on arrival 324mg , SL NTG PRN   2. Chronic systolic CHF secondary to non-ischemic cardiology: -Per echocardiogram in 2015 with EF 25-30% with subsequent St. Jude BiV in 2016.  -Will repeat echo today  -EF per stress nuc  2017 estimated 50%>>will schedule for repeat Myoview tomorrow  -Euvolemic on exam. No s/s of failure  -Carvedilol, spirolactone, low dose lasix and Entresto (97-103) -Admits to medication complaince -Hold BB in the setting of recent cocaine use  3. HTN: -Stable, 137/93>118/66>121/74>120/73 -Continue current regimen   4. HLD: -Lipid panel pending -Continue statin   5. CKD stage III: -Creatinine, 1.61   For questions or updates, please contact [place] Please consult [email or web address] for contact info under Cardiology/STEMI.   SignedKathyrn Drown NP-C [facility] Pager:  [phone] 03/11/2018 12:53 PM  I have seen, examined and evaluated the patient this PM along with Kathyrn Drown NP-C.  After reviewing all the available data and chart, we discussed the patients laboratory, study & physical findings as well as symptoms in detail. I agree with her findings, examination as well as impression recommendations as per our discussion.    Shubham presents here today with persistent chest pain that has been lasting overnight --this is within a couple days of having used cocaine.  He has had 3- troponin levels that have been negative.  He had a history of a normal cath back in 2014.  Apparently he has been essentially lost to follow-up for the last year or 2 while incarcerated.  He previously had an ejection fraction of 25-30%.  I do agree with following up an echocardiogram to see if this is resolved.  Because of ongoing chest pain within a week of cocaine use, cannot exclude coronary spasm.  We will switch his carvedilol to amlodipine for now for antispasm effect and consider adding nitrate.  Lexiscan Myoview to exclude ischemia.  Blood pressure looks relatively stable, need to monitor with adjustments of medications.   He already has a follow-up appointment scheduled with Dr.  Marigene Ehlers for the 17th.   Glenetta Hew, M.D., M.S. Interventional Cardiologist   Pager # [phone] Phone # [phone] [address] [place], [place] [address]

## 2018-03-11 NOTE — ED Notes (Signed)
Echo and MD bedside for evaluation and planning

## 2018-03-11 NOTE — ED Triage Notes (Signed)
Pt reports central CP that woke him from his sleep with SOB, radiation to back,  denies n/v

## 2018-03-12 ENCOUNTER — Other Ambulatory Visit: Payer: Self-pay

## 2018-03-12 ENCOUNTER — Observation Stay (HOSPITAL_BASED_OUTPATIENT_CLINIC_OR_DEPARTMENT_OTHER): Payer: Medicaid Other

## 2018-03-12 DIAGNOSIS — N183 Chronic kidney disease, stage 3 (moderate): Secondary | ICD-10-CM | POA: Diagnosis not present

## 2018-03-12 DIAGNOSIS — I5020 Unspecified systolic (congestive) heart failure: Secondary | ICD-10-CM | POA: Diagnosis not present

## 2018-03-12 DIAGNOSIS — R072 Precordial pain: Secondary | ICD-10-CM | POA: Diagnosis not present

## 2018-03-12 DIAGNOSIS — I130 Hypertensive heart and chronic kidney disease with heart failure and stage 1 through stage 4 chronic kidney disease, or unspecified chronic kidney disease: Secondary | ICD-10-CM | POA: Diagnosis not present

## 2018-03-12 DIAGNOSIS — I43 Cardiomyopathy in diseases classified elsewhere: Secondary | ICD-10-CM

## 2018-03-12 DIAGNOSIS — N182 Chronic kidney disease, stage 2 (mild): Secondary | ICD-10-CM

## 2018-03-12 DIAGNOSIS — F1490 Cocaine use, unspecified, uncomplicated: Secondary | ICD-10-CM

## 2018-03-12 DIAGNOSIS — I5022 Chronic systolic (congestive) heart failure: Secondary | ICD-10-CM | POA: Diagnosis not present

## 2018-03-12 DIAGNOSIS — F1910 Other psychoactive substance abuse, uncomplicated: Secondary | ICD-10-CM | POA: Diagnosis not present

## 2018-03-12 DIAGNOSIS — F1410 Cocaine abuse, uncomplicated: Secondary | ICD-10-CM | POA: Diagnosis not present

## 2018-03-12 DIAGNOSIS — Z79899 Other long term (current) drug therapy: Secondary | ICD-10-CM

## 2018-03-12 DIAGNOSIS — R0789 Other chest pain: Secondary | ICD-10-CM

## 2018-03-12 DIAGNOSIS — R079 Chest pain, unspecified: Secondary | ICD-10-CM

## 2018-03-12 DIAGNOSIS — I2510 Atherosclerotic heart disease of native coronary artery without angina pectoris: Secondary | ICD-10-CM | POA: Diagnosis not present

## 2018-03-12 LAB — HEMOGLOBIN A1C
Hgb A1c MFr Bld: 5.8 % — ABNORMAL HIGH (ref 4.8–5.6)
Mean Plasma Glucose: 119.76 mg/dL

## 2018-03-12 LAB — BASIC METABOLIC PANEL
ANION GAP: 8 (ref 5–15)
BUN: 12 mg/dL (ref 6–20)
CHLORIDE: 108 mmol/L (ref 101–111)
CO2: 23 mmol/L (ref 22–32)
Calcium: 8.1 mg/dL — ABNORMAL LOW (ref 8.9–10.3)
Creatinine, Ser: 1.55 mg/dL — ABNORMAL HIGH (ref 0.61–1.24)
GFR calc Af Amer: 56 mL/min — ABNORMAL LOW (ref >60)
GFR calc non Af Amer: 48 mL/min — ABNORMAL LOW (ref >60)
Glucose, Bld: 118 mg/dL — ABNORMAL HIGH (ref 65–99)
Potassium: 3.4 mmol/L — ABNORMAL LOW (ref 3.5–5.1)
Sodium: 139 mmol/L (ref 135–145)

## 2018-03-12 LAB — NM MYOCAR MULTI W/SPECT W/WALL MOTION / EF
CSEPED: 0 min
CSEPEDS: 0 s
CSEPEW: 1 METS
CSEPHR: 68 %
MPHR: 163 {beats}/min
Peak HR: 111 {beats}/min
Rest HR: 74 {beats}/min

## 2018-03-12 LAB — HIV ANTIBODY (ROUTINE TESTING W REFLEX): HIV Screen 4th Generation wRfx: NONREACTIVE

## 2018-03-12 MED ORDER — GI COCKTAIL ~~LOC~~
30.0000 mL | Freq: Once | ORAL | Status: AC
  Administered 2018-03-12: 30 mL via ORAL
  Filled 2018-03-12: qty 30

## 2018-03-12 MED ORDER — REGADENOSON 0.4 MG/5ML IV SOLN
0.4000 mg | Freq: Once | INTRAVENOUS | Status: AC
  Administered 2018-03-12: 0.4 mg via INTRAVENOUS
  Filled 2018-03-12: qty 5

## 2018-03-12 MED ORDER — REGADENOSON 0.4 MG/5ML IV SOLN
INTRAVENOUS | Status: AC
  Filled 2018-03-12: qty 5

## 2018-03-12 MED ORDER — TECHNETIUM TC 99M TETROFOSMIN IV KIT
30.0000 | PACK | Freq: Once | INTRAVENOUS | Status: AC | PRN
  Administered 2018-03-12: 30 via INTRAVENOUS

## 2018-03-12 MED ORDER — ATORVASTATIN CALCIUM 40 MG PO TABS: 40.0000 mg | ORAL_TABLET | Freq: Every day | ORAL | 3 refills | Status: DC

## 2018-03-12 MED ORDER — POTASSIUM CHLORIDE CRYS ER 20 MEQ PO TBCR
40.0000 meq | EXTENDED_RELEASE_TABLET | Freq: Once | ORAL | Status: AC
  Administered 2018-03-12: 40 meq via ORAL
  Filled 2018-03-12: qty 2

## 2018-03-12 MED ORDER — NITROGLYCERIN 0.4 MG SL SUBL: 0.4000 mg | SUBLINGUAL_TABLET | SUBLINGUAL | 12 refills | Status: DC | PRN

## 2018-03-12 MED ORDER — TECHNETIUM TC 99M TETROFOSMIN IV KIT
10.0000 | PACK | Freq: Once | INTRAVENOUS | Status: AC | PRN
  Administered 2018-03-12: 10 via INTRAVENOUS

## 2018-03-12 MED ORDER — ISOSORBIDE MONONITRATE ER 30 MG PO TB24: 30.0000 mg | ORAL_TABLET | Freq: Every day | ORAL | 0 refills | Status: DC

## 2018-03-12 MED ORDER — AMLODIPINE BESYLATE 5 MG PO TABS: 5.0000 mg | ORAL_TABLET | Freq: Every day | ORAL | 3 refills | Status: DC

## 2018-03-12 MED ORDER — MORPHINE SULFATE (PF) 2 MG/ML IV SOLN
2.0000 mg | INTRAVENOUS | Status: DC | PRN
  Administered 2018-03-12: 2 mg via INTRAVENOUS
  Filled 2018-03-12: qty 1

## 2018-03-12 NOTE — Progress Notes (Signed)
Visited patient in his room due to "persistent chest pain."  Updated HPI: Patient stated that he had begun his day as he always does. He denied breakfast, but attested to a "dry" sandwich about 1-2 hours prior to the pain. He stated that it began while riding a lawnmower outside. He denied associated symptoms including radiation of the pain, diaphoresis, attesting to the substernal/midepigastric pain. He has a history of GERD on a PPI and cocaine use disorder.   His Myoview stress test was low risk, troponin enzymes negative, the chest pain is persistent, there is no evidence of an associated abnormality in the area of the pain on CT angiogram or CXR(the possible lipoma is not in the are described as painful) His pain is atypical in nature with only the location consistent with cardiac pain. He was riding a Conservation officer, nature in cool weather with adequate hydration following recent cocaine use. I feel strongly that his chest pain is non-obstructive cocaine induced chest pain and there is no clear evidence that this is pulmonary. There is a mild possibility that this is GI related but there is no clear evidence of this. We attempted treatment with a GI cocktail to no avail.  He was advised to discontinue cocaine use. Most likely cocaine induced  Discontinue discharge. Will be held overnight for further evaluation

## 2018-03-12 NOTE — Progress Notes (Signed)
Patient received discharge information and acknowledged understanding of it. Patient IV was removed.  

## 2018-03-12 NOTE — Discharge Summary (Signed)
Name: Roger Crosby MRN: [id number] DOB: February 25, 1961 57 y.o. PCP: Patient, No Pcp Per  Date of Admission: 03/11/2018  5:27 AM Date of Discharge: 03/12/2018 Attending Physician: Dr. Evette Doffing  Discharge Diagnosis: Active Problems:   GERD   Chronic systolic CHF (congestive heart failure) ([place])   Substance abuse ([place])   Chest pain   Discharge Medications: Allergies as of 03/13/2018   No Known Allergies     Medication List    STOP taking these medications   carvedilol 25 MG tablet Commonly known as:  COREG   naproxen 500 MG tablet Commonly known as:  NAPROSYN   omeprazole 20 MG capsule Commonly known as:  PRILOSEC Replaced by:  pantoprazole 40 MG tablet     TAKE these medications   amLODipine 5 MG tablet Commonly known as:  NORVASC Take 1 tablet (5 mg total) by mouth daily.   aspirin EC 81 MG tablet Take 1 tablet (81 mg total) by mouth daily.   atorvastatin 40 MG tablet Commonly known as:  LIPITOR Take 1 tablet (40 mg total) by mouth daily at 6 PM. What changed:    medication strength  how much to take   ENTRESTO 97-103 MG Generic drug:  sacubitril-valsartan TAKE 1 TABLET BY MOUTH 2 (TWO) TIMES DAILY.   furosemide 20 MG tablet Commonly known as:  LASIX Take 1 tablet (20 mg total) by mouth daily.   isosorbide mononitrate 30 MG 24 hr tablet Commonly known as:  IMDUR Take 1 tablet (30 mg total) by mouth daily.   nitroGLYCERIN 0.4 MG SL tablet Commonly known as:  NITROSTAT Place 1 tablet (0.4 mg total) under the tongue every 5 (five) minutes as needed for chest pain.   pantoprazole 40 MG tablet Commonly known as:  PROTONIX Take 1 tablet (40 mg total) by mouth daily. Replaces:  omeprazole 20 MG capsule   spironolactone 25 MG tablet Commonly known as:  ALDACTONE TAKE 1 TABLET (25 MG TOTAL) BY MOUTH DAILY.       Disposition and follow-up:   Mr.Roger Crosby was discharged from [facility] in Stable condition.  At the hospital follow  up visit please address:  1.  Cocaine use disorder: Please continue to address the ongoing need to discontinue use.      Chest/upper abdominal chest pain: please assist the patient with obtaining a GI appointment for consideration of an EGD  2.  Labs / imaging needed at time of follow-up: n/a  3.  Pending labs/ test needing follow-up: n/a  Follow-up Appointments: Follow-up Information    [place]. Schedule an appointment as soon as possible for a visit in 1 week(s).   Contact information: [place] [id number] [place] Hospital Course by problem list: Active Problems:   GERD   Chronic systolic CHF (congestive heart failure) (HCC)   Substance abuse (HCC)   Chest pain   Chest Pain: This is a 57 yo M w/ a PMHx notable for HTN, HLD, HFrEF, nonischemic cardiomyopathy, nonobstructive CAD, CKD III, and one who uses cocaine who presented to the hospital for chest pain. His initial I-stat troponin was negative as was his repeat troponin. EKG did demonstrate new ST segment depression in the inferolateral leads which prompted an overnight stay for evaluation. It was thought that his myocardial ischemia/spasm is secondary to recent cocaine use, however, he underwent Myoview stress testing for the EKG changes associated with the  chest pain. The test was low risk which in conjunction with the nature of the pain, location, onset absent strenuous activity, troponin levels, was thought to be non-ischemic in nature. Cardiology recommended continuing his home on Imdur, Entresto, spironolactone, and amlodipine with discontinuation of his carvedilol in the setting of cocaine use w/ coronary vasospasm. His atorvastatin was increased to 40mg  daily on discharge. He was advised to discontinue cocaine use and determined to be stable for discharge on 03/12/2018.  There was some concern that the chest pain may be GI in nature given  the location in the mid epigastric region and history of GERD. He was continued on his home PPI and will need to follow with GI for EGD after discharge.   HFrEF:  LV-EF of 50% on 03/11/2018 with no evidence of volume overload on exam. He is to follow with Roger Crosby at his appointment on April 19th.   Discharge Vitals:   BP 140/77 (BP Location: Right Arm)   Pulse 78   Temp 98.6 F (37 C) (Oral)   Resp 19   Ht 5\' 8"  (1.727 m)   Wt 225 lb 12 oz (102.4 kg)   SpO2 96%   BMI 34.33 kg/m   Pertinent Labs, Studies, and Procedures:  CBC Latest Ref Rng & Units 03/11/2018 06/09/2016 04/12/2016  WBC 4.0 - 10.5 K/uL 10.7(H) 15.2(H) 11.3(H)  Hemoglobin 13.0 - 17.0 g/dL 15.3 15.6 13.5  Hematocrit 39.0 - 52.0 % 46.8 47.4 42.4  Platelets 150 - 400 K/uL 291 287 230   CMP Latest Ref Rng & Units 03/13/2018 03/12/2018 03/11/2018  Glucose 65 - 99 mg/dL 90 118(H) 94  BUN 6 - 20 mg/dL 10 12 10   Creatinine 0.61 - 1.24 mg/dL 1.43(H) 1.55(H) 1.61(H)  Sodium 135 - 145 mmol/L 140 139 138  Potassium 3.5 - 5.1 mmol/L 3.7 3.4(L) 3.6  Chloride 101 - 111 mmol/L 109 108 106  CO2 22 - 32 mmol/L 22 23 20(L)  Calcium 8.9 - 10.3 mg/dL 8.4(L) 8.1(L) 9.1  Total Protein 6.5 - 8.1 g/dL - - -  Total Bilirubin 0.3 - 1.2 mg/dL - - -  Alkaline Phos 38 - 126 U/L - - -  AST 15 - 41 U/L - - -  ALT 17 - 63 U/L - - -   NM Myocar 03/12/2018:  There was no ST segment deviation noted during stress.  No T wave inversion was noted during stress.  Defect 1: There is a medium defect of mild severity present in the basal inferior location.  This is a low risk study.  The left ventricular ejection fraction is moderately decreased (30-44%).  Nuclear stress EF: 40%.   Low risk stress nuclear study with a mild diaphragmatic attenuation artifact, otherwise normal perfusion and mild-to-moderate reduction in left ventricular global systolic function. Known non-ischemic cardiomyopathy. LVEF has decreased form 2017 report of  50%.  Echo 03/11/2018: Study Conclusions - Left ventricle: The cavity size was normal. There was moderate   focal basal hypertrophy of the septum. Systolic function was   normal. The estimated ejection fraction was in the range of 50%   to 55%. Wall motion was normal; there were no regional wall   motion abnormalities. Doppler parameters are consistent with   abnormal left ventricular relaxation (grade 1 diastolic   dysfunction). - Mitral valve: There was mild regurgitation. Impressions: - Low normal to mildly reduced LV systolic function (EF 50); mild   diastolic dysfunction; proximal septal thickening; mild MR and   TR.  Discharge Instructions: Discharge Instructions    Diet - low sodium heart healthy   Complete by:  As directed    Diet - low sodium heart healthy   Complete by:  As directed    Increase activity slowly   Complete by:  As directed    Increase activity slowly   Complete by:  As directed      Signed: Kathi Ludwig, MD 03/14/2018, 12:34 PM   Pager: Pager# [phone]

## 2018-03-12 NOTE — Progress Notes (Signed)
Informed the patient stress test result. Discussed with him regarding cocaine cessation as well.  Hilbert Corrigan PA Pager: [phone]

## 2018-03-12 NOTE — Progress Notes (Addendum)
Progress Note  Patient Name: Roger Crosby Date of Encounter: 03/12/2018  Primary Cardiologist: Dr. Haroldine Laws  Primary Electrophysiologist: Dr. Caryl Comes     Subjective   Persistent atypical chest pain, no SOB  Inpatient Medications    Scheduled Meds: . amLODipine  5 mg Oral Daily  . aspirin EC  81 mg Oral Daily  . atorvastatin  20 mg Oral q1800  . enoxaparin (LOVENOX) injection  40 mg Subcutaneous Q24H  . isosorbide mononitrate  30 mg Oral Daily  . pantoprazole  40 mg Oral Daily  . regadenoson      . sacubitril-valsartan  1 tablet Oral BID  . spironolactone  25 mg Oral Daily   Continuous Infusions:  PRN Meds: acetaminophen, nitroGLYCERIN   Vital Signs    Vitals:   03/12/18 0953 03/12/18 0955 03/12/18 0957 03/12/18 1105  BP: 132/89 (!) 141/79 137/79 136/78  Pulse: 90 (!) 107 (!) 102   Resp:      Temp:      TempSrc:      SpO2:      Weight:      Height:        Intake/Output Summary (Last 24 hours) at 03/12/2018 1114 Last data filed at 03/11/2018 1127 Gross per 24 hour  Intake 1000 ml  Output -  Net 1000 ml   Filed Weights   03/11/18 0153 03/11/18 1853 03/12/18 0636  Weight: 230 lb (104.3 kg) 229 lb 9.6 oz (104.1 kg) 228 lb 8 oz (103.6 kg)    Telemetry    Paced rhythm - Personally Reviewed  ECG    NSR with incomplete LBBB - Personally Reviewed  Physical Exam   GEN: No acute distress.   Neck: No JVD Cardiac: RRR, no murmurs, rubs, or gallops. Pain is worse with chest wall palpation. Respiratory: Clear to auscultation bilaterally. GI: Soft, nontender, non-distended  MS: No edema; No deformity. Neuro:  Nonfocal  Psych: Normal affect   Labs    Chemistry Recent Labs  Lab 03/11/18 0200 03/12/18 0405  NA 138 139  K 3.6 3.4*  CL 106 108  CO2 20* 23  GLUCOSE 94 118*  BUN 10 12  CREATININE 1.61* 1.55*  CALCIUM 9.1 8.1*  GFRNONAA 46* 48*  GFRAA 53* 56*  ANIONGAP 12 8     Hematology Recent Labs  Lab 03/11/18 0200  WBC 10.7*  RBC 5.85*   HGB 15.3  HCT 46.8  MCV 80.0  MCH 26.2  MCHC 32.7  RDW 15.6*  PLT 291    Cardiac Enzymes Recent Labs  Lab 03/11/18 1600  TROPONINI <0.03    Recent Labs  Lab 03/11/18 0236 03/11/18 0658  TROPIPOC 0.00 0.00     BNPNo results for input(s): BNP, PROBNP in the last 168 hours.   DDimer  Recent Labs  Lab 03/11/18 0202  DDIMER 0.51*     Radiology    Dg Chest 2 View  Result Date: 03/11/2018 CLINICAL DATA:  Chest pain.  Dizziness. EXAM: CHEST - 2 VIEW COMPARISON:  06/09/2016 FINDINGS: Left-sided pacemaker in place with leads projecting over the right atrium and ventricle. Additional leads project over the left ventricle. The heart is normal in size. Normal mediastinal contours. No pulmonary edema. No consolidation, pleural effusion or pneumothorax. No acute osseous abnormalities. IMPRESSION: No acute findings. Electronically Signed   By: Jeb Levering M.D.   On: 03/11/2018 03:01   Ct Angio Chest Pe W And/or Wo Contrast  Result Date: 03/11/2018 CLINICAL DATA:  Chest pain and shortness of  breath. History of congestive heart failure. Positive D-dimer. Evaluate for pulmonary embolism. EXAM: CT ANGIOGRAPHY CHEST WITH CONTRAST TECHNIQUE: Multidetector CT imaging of the chest was performed using the standard protocol during bolus administration of intravenous contrast. Multiplanar CT image reconstructions and MIPs were obtained to evaluate the vascular anatomy. CONTRAST:  132mL ISOVUE-370 IOPAMIDOL (ISOVUE-370) INJECTION 76% COMPARISON:  Chest radiograph-earlier same day; 06/09/2016; 04/06/2016 FINDINGS: Vascular Findings: There is adequate opacification of the pulmonary arterial system with the main pulmonary artery measuring 309 Hounsfield units. There are no discrete filling defects within the pulmonary arterial tree to suggest pulmonary embolism. Mild dilatation of the main pulmonary artery measuring 3.3 cm in diameter. Cardiomegaly. Post dual lead AICD/pacemaker with additional leads  about the lateral wall of the left ventricle with one of these leads coursing through the caudal aspect of the lingula (image 60, series 5), unchanged when compared to remote chest radiograph compared /06/2016. Minimal amount of atherosclerotic plaque with a normal caliber thoracic aorta. No definite thoracic aortic dissection or periaortic stranding on this nongated examination. Conventional configuration of the aortic arch. The branch vessels of the aortic arch appear patent throughout their imaged course. Review of the MIP images confirms the above findings. ---------------------------------------------------------------------------------- Nonvascular Findings: Mediastinum/Lymph Nodes: No bulky mediastinal, axillary or hilar lymphadenopathy. Lungs/Pleura: Minimal dependent subpleural ground-glass atelectasis. 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 Minimal grossly symmetric biapical pleuroparenchymal thickening. No discrete focal airspace opacities. No pleural effusion or pneumothorax. The central pulmonary airways appear widely patent. No discrete pulmonary nodules. Upper abdomen: Limited early arterial phase evaluation of the upper abdomen is normal. Musculoskeletal: No acute or aggressive osseous abnormalities. Degenerative change of the lower lumbar spine. IMPRESSION: 1. No acute cardiopulmonary disease. Specifically, no evidence of pulmonary embolism. 2. Cardiomegaly with enlargement of the caliber of the main pulmonary artery, nonspecific though could be seen in the setting of pulmonary arterial hypertension. 3.  Aortic Atherosclerosis (ICD10-I70.0). Electronically Signed   By: Sandi Mariscal M.D.   On: 03/11/2018 08:52    Cardiac  Studies   Echo 03/11/2018 LV EF: 50% -   55% Study Conclusions  - Left ventricle: The cavity size was normal. There was moderate   focal basal hypertrophy of the septum. Systolic function was   normal. The estimated ejection fraction was in the range of 50%   to 55%. Wall motion was normal; there were no regional wall   motion abnormalities. Doppler parameters are consistent with   abnormal left ventricular relaxation (grade 1 diastolic   dysfunction). - Mitral valve: There was mild regurgitation.  Impressions:  - Low normal to mildly reduced LV systolic function (EF 50); mild   diastolic dysfunction; proximal septal thickening; mild MR and   TR.  Patient Profile     57 y.o. male with a hx of nonischemic cardiomyopathy (EF 25-30%, s/p St. 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 Marlowe Sax.   Assessment & Plan    1. 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 Need to stop using Cocaine  - if myoview negative, expect discharge this afternoon.  2. Cocaine abuse: positive UDS  3. NICM s/p St Jude ICD: EF improved, Echo 03/11/2018 EF 50%  - euvolemic on exam.   - continue Entresto, spironolactone, Imdur and amlodipine. No BB given cocaine use.   4. Stage III CKD: renal function stable  5. HLD: LDL 127, increase lipitor to 40mg  daily.    For questions or updates, please contact [place] Please consult [email or web address] for contact info under Cardiology/STEMI.      SignedAlmyra Deforest, PA  03/12/2018, 11:14 AM   I have seen, examined the patient, and reviewed the above assessment and plan.  Changes to above  are made where necessary.  On exam, RRR.  NAD.  Chest pain worse with chest wall palpation. CTA negative for PTE.  Low risk myoview and normal troponins.  ekgs similar to prior.    Ok to discharge at this time  Cocaine cessation strongly advised.  Co Sign: Thompson Grayer, MD 03/12/2018 2:25 PM

## 2018-03-12 NOTE — Plan of Care (Signed)
  Problem: Education: Goal: Knowledge of General Education information will improve Outcome: Progressing Note:  POC reviewed with pt.   Problem: Clinical Measurements: Goal: Diagnostic test results will improve Outcome: Progressing Note:  Pt. aware he's NPO for Stress test 4/13.

## 2018-03-12 NOTE — Progress Notes (Signed)
   Subjective: The patient was resting comfortably in his bed today following his cardiac stress test. He continues to endorse mid-sternal chest pain but stated that it has improved since onset.   Objective:  Vital signs in last 24 hours: Vitals:   03/11/18 1853 03/11/18 1900 03/11/18 2104 03/12/18 0636  BP:  137/72 129/68 120/65  Pulse:  83 79 69  Resp:   20   Temp:  98.5 F (36.9 C) 98.3 F (36.8 C) 98.7 F (37.1 C)  TempSrc:  Oral Oral Oral  SpO2:  97% 93% 94%  Weight: 229 lb 9.6 oz (104.1 kg)   228 lb 8 oz (103.6 kg)  Height: 5\' 8"  (1.727 m)      ROS negative except as per HPI.  Physical Exam: General: in no acute distress, afebrile, non-diaphoretic  Neuro: no acute focal deficits Cardiovascular: RRR, no murmurs rubs or gallops Pulmonary: bilateral lung fields clear to auscultation GI: soft, nontender, nondistended Musculoskeletal: bilateral lower extremities nontender, nonedematous  Assessment/Plan:  Active Problems:   Chest pain  Assessment:  This is a 57 yo M w/ a PMHx notable for HTN, HLD, HFrEF, nonischemic cardiomyopathy, nonobstructive CAD, CKD III, and one who uses cocaine who presented to the hospital for chest pain. His initial Istat troponin was negative as was his repeat troponin. EKG with new ST segment depression in the inferolateral leads which is new. IT is thought that his myocardial ischemia/spasm is secondary to cocaine, however, he underwent Myoview stress testing for the EKG changes associated with the chest pain.   Plan: Non cardiac chest pain: The patients myoview was low risk which in this setting is consistent with the other findings with the negative troponin and persistent nature of the pain. I agree that he appears stable for discharge with continued advice to stop using cocaine.  --discharge home on Imdur, Entresto, spironolactone, and amlodipine as per cardiology. We agree that discontinuing his BB is most efficacious given his cocaine use  history.  --HLD-Atorvastatin was increased to 40mg  daily, will recommend that he continue this on the outpatient setting.   CHF: EF improved following placement of the ICD with his most recent echo demonstrating an LV EF 50% on 03/11/2018. NO evidence of volume overload on physical exam. Recommend that he continues to follow with the heart failure team. I believe he has an appointment with Dr. Aundra Dubin on April 19th. He is encouraged to make this appointment.  CKD II: The patient is encouraged to establish with a PCP and as such was provided with the information of [place] as he will need assistance in treating his other non cardiac conditions. His renal function is stable this admission with a Cr of 1.55 and GFR 56.  Dispo: Anticipated discharge in approximately 0 day(s).   Kathi Ludwig, MD 03/12/2018, 6:50 AM Pager: Pager# [phone]

## 2018-03-13 DIAGNOSIS — N179 Acute kidney failure, unspecified: Secondary | ICD-10-CM

## 2018-03-13 DIAGNOSIS — N183 Chronic kidney disease, stage 3 (moderate): Secondary | ICD-10-CM

## 2018-03-13 DIAGNOSIS — I130 Hypertensive heart and chronic kidney disease with heart failure and stage 1 through stage 4 chronic kidney disease, or unspecified chronic kidney disease: Secondary | ICD-10-CM | POA: Diagnosis not present

## 2018-03-13 DIAGNOSIS — Z7982 Long term (current) use of aspirin: Secondary | ICD-10-CM

## 2018-03-13 DIAGNOSIS — R0789 Other chest pain: Secondary | ICD-10-CM | POA: Diagnosis not present

## 2018-03-13 DIAGNOSIS — Z950 Presence of cardiac pacemaker: Secondary | ICD-10-CM

## 2018-03-13 DIAGNOSIS — I5020 Unspecified systolic (congestive) heart failure: Secondary | ICD-10-CM | POA: Diagnosis not present

## 2018-03-13 LAB — BASIC METABOLIC PANEL
ANION GAP: 9 (ref 5–15)
BUN: 10 mg/dL (ref 6–20)
CO2: 22 mmol/L (ref 22–32)
Calcium: 8.4 mg/dL — ABNORMAL LOW (ref 8.9–10.3)
Chloride: 109 mmol/L (ref 101–111)
Creatinine, Ser: 1.43 mg/dL — ABNORMAL HIGH (ref 0.61–1.24)
GFR calc non Af Amer: 53 mL/min — ABNORMAL LOW (ref >60)
Glucose, Bld: 90 mg/dL (ref 65–99)
Potassium: 3.7 mmol/L (ref 3.5–5.1)
Sodium: 140 mmol/L (ref 135–145)

## 2018-03-13 MED ORDER — PANTOPRAZOLE SODIUM 40 MG PO TBEC: 40.0000 mg | DELAYED_RELEASE_TABLET | Freq: Every day | ORAL | 0 refills | Status: DC

## 2018-03-13 NOTE — Care Management Note (Signed)
Case Management Note  Patient Details  Name: Roger Crosby MRN: [id number] Date of Birth: Sep 18, 1961  Subjective/Objective:              Patient from home, independent. [place] letter provided to patient. No other CM needs identified.       Action/Plan:   Expected Discharge Date:  03/13/18               Expected Discharge Plan:  Home/Self Care  In-House Referral:     Discharge planning Services  CM Consult, [facility] Program  Post Acute Care Choice:    Choice offered to:     DME Arranged:    DME Agency:     HH Arranged:    HH Agency:     Status of Service:  Completed, signed off  If discussed at [employer], dates discussed:    Additional Comments:  Carles Collet, RN 03/13/2018, 2:04 PM

## 2018-03-13 NOTE — Discharge Instructions (Signed)
Please call [facility] first thing Monday morning to establish care with them as your primary doctor. You will need a primary physician to assist you with your overall care and to work with you cardiologist.  You have been prescribed amlodipine 5mg  daily, IMDUR 30 mg daily, and sublingual Nitro tablets for chest pain.   Please stop taking your carvedilol as as concurrent use of this medication with cocaine can have serious side effects.   I have continued all of your other medications but I increased your Atorvastatin to 40mg  daily as per the cardiologist recommendations.   Use NSAIDS such as Aleve sparingly and states sitting up after you eat a meal, these can help to relieve your symptoms of heartburn You should schedule an appointment with a gastroenterologist to have an EGD  Thank you for your visit to the [facility].

## 2018-03-13 NOTE — Progress Notes (Addendum)
   Subjective: Mr. Konigsberg is feeling well today, his pain has relieved to 1 out of 10 after GI cocktail overnight.  He was able to sleep well and was up in bed eating breakfast when seen on rounds this morning.  He was updated on the likelihood of this chest pain being related to GERD and updated on the plan to continue regular PPI and need for follow-up with GI for EGD after discharge.  Objective:  Vital signs in last 24 hours: Vitals:   03/12/18 1105 03/12/18 1706 03/12/18 2100 03/13/18 0523  BP: 136/78 137/76 112/68 140/77  Pulse:  79 79 78  Resp:  18 20 19   Temp:  98.9 F (37.2 C) 99 F (37.2 C) 98.6 F (37 C)  TempSrc:  Oral Oral Oral  SpO2:  91% 94% 96%  Weight:    225 lb 12 oz (102.4 kg)  Height:       Neuro: Well-appearing, no acute distress Cardiac: Regular rate and rhythm, no murmurs, normal S1 and S2, no peripheral edema Pulm: Normal work of breathing, lungs clear to auscultation no rhonchi, wheezes GI: The abdomen is soft, mild tenderness with palpation of the epigastrium, nondistended  Assessment/Plan:    Chest pain - presented with atypical substernal chest pain which was nonradiating, constant, reproducible on exam not associated with nausea shortness of breath or diaphoresis and began after eating a dry sandwich which was difficult to swallow  -History of chronic combined congestive heart failure necessitating a pacemaker with non ischemic CAD on last cath in 2014 and known current use of cocaine  - Myoview yesterday was low risk correlating with low risk of ACS in the next year  -We have medically optimized his cardiac risk factors, he is on atorvastatin, entresto, and daily aspirin, this admission he was started on indoor and atorvastatin dose was increased -Chest pain improved significantly with GI cocktail overnight, he does note that he experiences heartburn when he misses a dose of omeprazole at home  - start protonix 40 mg daily  - Will need GI follow up for  EGD  - will arrange for a follow up appointment in the [facility], he is deciding on an outpatient PCP but has not picked where he would like to go yet so he should follow up with [facility] once   AKI on CKD  Creatinine improved to 1.4 today, this is at the baseline from 2017.   Dispo: Anticipated discharge today   Ledell Noss, MD 03/13/2018, 11:45 AM Pager: [phone]

## 2018-03-18 ENCOUNTER — Ambulatory Visit (HOSPITAL_COMMUNITY)
Admission: RE | Admit: 2018-03-18 | Discharge: 2018-03-18 | Disposition: A | Discharge status: Home / Self Care | Payer: Medicaid Other | Source: Ambulatory Visit | Attending: Cardiology | Admitting: Cardiology

## 2018-03-18 ENCOUNTER — Encounter (HOSPITAL_COMMUNITY): Payer: Self-pay | Admitting: Cardiology

## 2018-03-18 VITALS — BP 145/90 | HR 71 | Wt 231.8 lb

## 2018-03-18 DIAGNOSIS — R0683 Snoring: Secondary | ICD-10-CM | POA: Diagnosis not present

## 2018-03-18 DIAGNOSIS — Z833 Family history of diabetes mellitus: Secondary | ICD-10-CM | POA: Insufficient documentation

## 2018-03-18 DIAGNOSIS — Z95810 Presence of automatic (implantable) cardiac defibrillator: Secondary | ICD-10-CM | POA: Diagnosis not present

## 2018-03-18 DIAGNOSIS — K219 Gastro-esophageal reflux disease without esophagitis: Secondary | ICD-10-CM | POA: Diagnosis not present

## 2018-03-18 DIAGNOSIS — I429 Cardiomyopathy, unspecified: Secondary | ICD-10-CM | POA: Insufficient documentation

## 2018-03-18 DIAGNOSIS — G4733 Obstructive sleep apnea (adult) (pediatric): Secondary | ICD-10-CM | POA: Insufficient documentation

## 2018-03-18 DIAGNOSIS — I428 Other cardiomyopathies: Secondary | ICD-10-CM

## 2018-03-18 DIAGNOSIS — I2510 Atherosclerotic heart disease of native coronary artery without angina pectoris: Secondary | ICD-10-CM | POA: Insufficient documentation

## 2018-03-18 DIAGNOSIS — I447 Left bundle-branch block, unspecified: Secondary | ICD-10-CM | POA: Diagnosis not present

## 2018-03-18 DIAGNOSIS — N189 Chronic kidney disease, unspecified: Secondary | ICD-10-CM | POA: Insufficient documentation

## 2018-03-18 DIAGNOSIS — I129 Hypertensive chronic kidney disease with stage 1 through stage 4 chronic kidney disease, or unspecified chronic kidney disease: Secondary | ICD-10-CM | POA: Diagnosis not present

## 2018-03-18 DIAGNOSIS — I10 Essential (primary) hypertension: Secondary | ICD-10-CM

## 2018-03-18 DIAGNOSIS — Z79899 Other long term (current) drug therapy: Secondary | ICD-10-CM | POA: Diagnosis not present

## 2018-03-18 MED ORDER — CARVEDILOL 6.25 MG PO TABS: 6.2500 mg | ORAL_TABLET | Freq: Two times a day (BID) | ORAL | 3 refills | Status: DC

## 2018-03-18 NOTE — Patient Instructions (Addendum)
Start Carvedilol 6.25 mg (1 tab), twice a day  Your physician has recommended that you have a sleep study. This test records several body functions during sleep, including: brain activity, eye movement, oxygen and carbon dioxide blood levels, heart rate and rhythm, breathing rate and rhythm, the flow of air through your mouth and nose, snoring, body muscle movements, and chest and belly movement.  (they will call you)   Kennyth Lose CSW will call you  Your physician recommends that you schedule a follow-up appointment in: 1 month with Doroteo Bradford, Pharm D   Your physician recommends that you schedule a follow-up appointment in: 8-10 weeks with Dr. Aundra Dubin

## 2018-03-21 NOTE — Progress Notes (Signed)
Patient ID: Roger Crosby, male   DOB: [birth year], 57 y.o.   MRN: [id number]  Cardiology: Dr. Aundra Dubin  57 y.o. with history of LBBB and nonischemic cardiomyopathy returns for followup of CHF.  Echo in 2014 showed EF 20-25% with regional wall motion abnormalities.  I did a left heart cath in 9/14 that showed no obstructive disease.  He has a nonischemic cardiomyopathy.  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   He was lost to followup for about 2 years (was in prison).  He presented to [facility] ER in 4/19 with chest pain.  UDS was positive for cocaine.  He says that he "messed up" and was grieving for a death in the family.  No cocaine since, and he had no used any cocaine prior to that since leaving prison.  Echo in 4/19 showed EF 50-55% with mild MR.  Cardiolite in 4/19 showed EF 40% with no perfusion defect (low risk).    He has chronic dyspnea after walking 50-100 yards. He is short of breath lifting heavy loads or with doing yardwork.  No further chest pain.  BP is elevated today. He snores and has daytime sleepiness.   St Jude device interrogated: Stable thoracic impedance, no evidence for volume overload.    ECG: NSR, BiV paced  Labs (9/14): K 3.9, creatinine 1.6 Labs (10/14): K 4, creatinine 1.3 Labs (4/19): K 3.7, creatinine 1.43, LDL 127, HIV negative  PMH: 1. Hypertension  2. GERD  3. Low back pain  4. LBBB: Newly noted 2011  5. Nonischemic cardiomyopathy: Echo (5/11): EF 45-50%, mild hypokinesis of the apical anteroseptal wall, apical anterior wall, and true apex (LAD distribution), mild LAE.  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  Echo (6/14) with EF 20-25% with inferior/inferoseptal akinesis and hypokinesis elsewhere.  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  - St Jude CRT-D - Echo (4/19) with EF 50-55%, mild MR. - Cardiolite (4/19) with EF 40%, no significant perfusion defect.  6. CKD   Family History:  Mother deceased CHF,DM,HTN   Father with heart trouble...died age 74.  1 sister Crohn's disease  1 sister healthy  1 brother died Prostate cancer/AIDS...was diagnosed with prostate cancer at age 72.  Father and mother both had MIs in their 16s   Social History:  [employer] information officer, does some landscaping now.   HS graduate.  Married  3 kids  Alcohol use-no. None since 2008  Drug use-prior cocaine but rare since 2008. Denies IVDU.  Prior smoker none since 2008.   Review of Systems  All systems reviewed and negative except as per HPI.   Current Outpatient Medications  Medication Sig Dispense Refill  . ENTRESTO 97-103 MG TAKE 1 TABLET BY MOUTH 2 (TWO) TIMES DAILY. 60 tablet 3  . carvedilol (COREG) 6.25 MG tablet Take 1 tablet (6.25 mg total) by mouth 2 (two) times daily. 60 tablet 3   No current facility-administered medications for this encounter.     BP (!) 145/90   Pulse 71   Wt 231 lb 12 oz (105.1 kg)   SpO2 98%   BMI 35.24 kg/m  General: NAD Neck: No JVD, no thyromegaly or thyroid nodule.  Lungs: Clear to auscultation bilaterally with normal respiratory effort. CV: Nondisplaced PMI.  Heart regular S1/S2, no S3/S4, no murmur.  No peripheral edema.  No carotid bruit.  Normal pedal pulses.  Abdomen: Soft, nontender, no hepatosplenomegaly, no distention.  Skin: Intact without lesions or rashes.  Neurologic: Alert and oriented x 3.  Psych: Normal affect. Extremities: No clubbing or cyanosis.  HEENT: Normal.   Assessment/Plan: 1. Nonischemic cardiomyopathy: No ETOH currently, used cocaine once recently but denies any further use.  EF 20-25% with LBBB in 2014, had St Jude CRT-D device placed with improvement in LV function.  No significant CAD on cath.  NYHA class II-III symptoms but no volume overload on exam or by Corevue.  4/19 echo showed improvement in EF to 50-55%, which Cardiolite showed EF 40%.  - Continue Entresto 97/103 bid.  - Start Coreg 6.25 mg bid (I suspect he  will not use cocaine again).   2. CKD: Creatinine has been stable.  3. HTN: BP high today, adding Coreg as above.  4. OSA: Strongly suspect.  I will arrange for sleep study.   Followup in 1 month in pharmacy clinic for BP check and med titration, see me in 2 months.    [name] 03/21/2018

## 2018-03-23 ENCOUNTER — Telehealth: Payer: Self-pay | Admitting: Licensed Clinical Social Worker

## 2018-03-23 NOTE — Telephone Encounter (Signed)
CSW received request to assist patient with disability. Patient reports he was previously on disability although he was incarcerated for two years. Patient reports he met with Social Security on February 25 and stated they "need paperwork form the MD with an update". Patient unsure of his caseworker or where to send update. CSW encouraged patient to check mail and inquire about caseworker and needed information regarding update for disability. Patient instructed to return to CSW once information obtained for further assistance with completing update and request for information. Patient verbalizes understanding and will call CSW when complete. Raquel Sarna, [place], [place]

## 2018-04-05 ENCOUNTER — Ambulatory Visit

## 2018-04-11 ENCOUNTER — Other Ambulatory Visit: Payer: Self-pay | Admitting: Internal Medicine

## 2018-04-18 ENCOUNTER — Encounter (HOSPITAL_COMMUNITY): Payer: Self-pay | Admitting: *Deleted

## 2018-04-18 NOTE — Progress Notes (Signed)
Received medical records request from [facility] DDS, CASE # [id number]  Requested records faxed today to [phone].  Original request will be scanned to patient's electronic medical record.

## 2018-04-20 ENCOUNTER — Inpatient Hospital Stay (HOSPITAL_COMMUNITY): Admission: RE | Admit: 2018-04-20 | Source: Ambulatory Visit

## 2018-05-25 ENCOUNTER — Encounter: Admitting: *Deleted

## 2018-05-25 ENCOUNTER — Telehealth: Payer: Self-pay

## 2018-05-25 NOTE — Telephone Encounter (Signed)
Attempted to confirm remote transmission with pt. No answer and was unable to leave a message.   

## 2018-05-26 ENCOUNTER — Encounter: Payer: Self-pay | Admitting: Cardiology

## 2018-06-01 ENCOUNTER — Ambulatory Visit (INDEPENDENT_AMBULATORY_CARE_PROVIDER_SITE_OTHER): Payer: Medicaid Other | Admitting: *Deleted

## 2018-06-01 DIAGNOSIS — I428 Other cardiomyopathies: Secondary | ICD-10-CM | POA: Diagnosis not present

## 2018-06-09 ENCOUNTER — Encounter (HOSPITAL_COMMUNITY): Admitting: Cardiology

## 2018-06-24 ENCOUNTER — Encounter: Payer: Self-pay | Admitting: Cardiology

## 2018-06-24 NOTE — Progress Notes (Signed)
Remote ICD transmission.   

## 2018-06-29 LAB — CUP PACEART REMOTE DEVICE CHECK
Battery Voltage: 2.92 V
Brady Statistic AP VS Percent: 1 %
Brady Statistic AS VP Percent: 96 %
Brady Statistic AS VS Percent: 3.1 %
HighPow Impedance: 68 Ohm
HighPow Impedance: 68 Ohm
Implantable Lead Implant Date: 20160310
Implantable Lead Location: 753859
Implantable Lead Location: 753860
Implantable Lead Model: 5076
Implantable Lead Model: 511212
Implantable Lead Serial Number: 247936
Implantable Pulse Generator Implant Date: 20160115
Lead Channel Impedance Value: 360 Ohm
Lead Channel Impedance Value: 480 Ohm
Lead Channel Pacing Threshold Amplitude: 0.5 V
Lead Channel Pacing Threshold Amplitude: 0.875 V
Lead Channel Pacing Threshold Pulse Width: 0.5 ms
Lead Channel Sensing Intrinsic Amplitude: 12 mV
Lead Channel Sensing Intrinsic Amplitude: 3.7 mV
Lead Channel Setting Pacing Amplitude: 2 V
Lead Channel Setting Pacing Amplitude: 2.5 V
Lead Channel Setting Pacing Pulse Width: 0.5 ms
Lead Channel Setting Pacing Pulse Width: 0.5 ms
MDC IDC LEAD IMPLANT DT: 20160115
MDC IDC LEAD IMPLANT DT: 20160115
MDC IDC LEAD LOCATION: 753858
MDC IDC MSMT BATTERY REMAINING LONGEVITY: 38 mo
MDC IDC MSMT BATTERY REMAINING PERCENTAGE: 50 %
MDC IDC MSMT LEADCHNL LV PACING THRESHOLD PULSEWIDTH: 0.5 ms
MDC IDC MSMT LEADCHNL RV IMPEDANCE VALUE: 460 Ohm
MDC IDC MSMT LEADCHNL RV PACING THRESHOLD AMPLITUDE: 0.25 V
MDC IDC MSMT LEADCHNL RV PACING THRESHOLD PULSEWIDTH: 0.5 ms
MDC IDC SESS DTM: 20190703192332
MDC IDC SET LEADCHNL RA PACING AMPLITUDE: 2 V
MDC IDC SET LEADCHNL RV SENSING SENSITIVITY: 0.5 mV
MDC IDC STAT BRADY AP VP PERCENT: 1.2 %
MDC IDC STAT BRADY RA PERCENT PACED: 1.1 %
Pulse Gen Serial Number: 7209167

## 2018-07-01 ENCOUNTER — Telehealth: Payer: Self-pay

## 2018-07-01 NOTE — Telephone Encounter (Signed)
Contacting pt about remote home monitoring but he has an invalid number.

## 2018-07-05 ENCOUNTER — Ambulatory Visit (HOSPITAL_COMMUNITY)
Admission: RE | Admit: 2018-07-05 | Discharge: 2018-07-05 | Disposition: A | Discharge status: Home / Self Care | Payer: Medicaid Other | Source: Ambulatory Visit | Attending: Cardiology | Admitting: Cardiology

## 2018-07-05 ENCOUNTER — Encounter (HOSPITAL_COMMUNITY): Payer: Self-pay | Admitting: *Deleted

## 2018-07-05 VITALS — BP 134/82 | HR 70 | Wt 228.2 lb

## 2018-07-05 DIAGNOSIS — I447 Left bundle-branch block, unspecified: Secondary | ICD-10-CM | POA: Insufficient documentation

## 2018-07-05 DIAGNOSIS — Z79899 Other long term (current) drug therapy: Secondary | ICD-10-CM | POA: Insufficient documentation

## 2018-07-05 DIAGNOSIS — G4733 Obstructive sleep apnea (adult) (pediatric): Secondary | ICD-10-CM | POA: Insufficient documentation

## 2018-07-05 DIAGNOSIS — I5022 Chronic systolic (congestive) heart failure: Secondary | ICD-10-CM | POA: Diagnosis not present

## 2018-07-05 DIAGNOSIS — I428 Other cardiomyopathies: Secondary | ICD-10-CM | POA: Diagnosis not present

## 2018-07-05 DIAGNOSIS — N183 Chronic kidney disease, stage 3 (moderate): Secondary | ICD-10-CM | POA: Insufficient documentation

## 2018-07-05 DIAGNOSIS — R0683 Snoring: Secondary | ICD-10-CM

## 2018-07-05 DIAGNOSIS — I129 Hypertensive chronic kidney disease with stage 1 through stage 4 chronic kidney disease, or unspecified chronic kidney disease: Secondary | ICD-10-CM | POA: Diagnosis present

## 2018-07-05 DIAGNOSIS — Z95810 Presence of automatic (implantable) cardiac defibrillator: Secondary | ICD-10-CM | POA: Diagnosis not present

## 2018-07-05 DIAGNOSIS — K219 Gastro-esophageal reflux disease without esophagitis: Secondary | ICD-10-CM | POA: Diagnosis not present

## 2018-07-05 DIAGNOSIS — I2510 Atherosclerotic heart disease of native coronary artery without angina pectoris: Secondary | ICD-10-CM | POA: Diagnosis not present

## 2018-07-05 LAB — BASIC METABOLIC PANEL
Anion gap: 9 (ref 5–15)
BUN: 12 mg/dL (ref 6–20)
CALCIUM: 9.4 mg/dL (ref 8.9–10.3)
CO2: 23 mmol/L (ref 22–32)
CREATININE: 1.46 mg/dL — AB (ref 0.61–1.24)
Chloride: 106 mmol/L (ref 98–111)
GFR calc Af Amer: 60 mL/min — ABNORMAL LOW (ref >60)
GFR calc non Af Amer: 52 mL/min — ABNORMAL LOW (ref >60)
GLUCOSE: 98 mg/dL (ref 70–99)
Potassium: 4 mmol/L (ref 3.5–5.1)
Sodium: 138 mmol/L (ref 135–145)

## 2018-07-05 MED ORDER — CARVEDILOL 6.25 MG PO TABS: 6.2500 mg | ORAL_TABLET | Freq: Two times a day (BID) | ORAL | 3 refills | Status: DC

## 2018-07-05 MED ORDER — SACUBITRIL-VALSARTAN 97-103 MG PO TABS: 1.0000 | ORAL_TABLET | Freq: Two times a day (BID) | ORAL | 3 refills | Status: DC

## 2018-07-05 MED ORDER — SACUBITRIL-VALSARTAN 97-103 MG PO TABS
1.0000 | ORAL_TABLET | Freq: Two times a day (BID) | ORAL | 3 refills | Status: DC
Start: 2018-07-05 — End: 2018-07-05

## 2018-07-05 NOTE — Patient Instructions (Signed)
Labs today (will call for abnormal results, otherwise no news is good news)  Sleep Study has been ordered for you, once insurance approves we'll call and schedule it for you.   Follow up in 6 months, please call our clinic in January 2020 to schedule appointment. [phone], option 3.

## 2018-07-06 NOTE — Progress Notes (Signed)
Patient ID: Roger Crosby, male   DOB: 01/02/1961, 57 y.o.   MRN: [id number]  Cardiology: Dr. Aundra Dubin  57 y.o. with history of LBBB and nonischemic cardiomyopathy returns for followup of CHF.  Echo in 2014 showed EF 20-25% with regional wall motion abnormalities.  I did a left heart cath in 9/14 that showed no obstructive disease.  He has a nonischemic cardiomyopathy.  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   He was lost to followup for about 2 years (was in prison).  He presented to [facility] ER in 4/19 with chest pain.  UDS was positive for cocaine.  He says that he "messed up" and was grieving for a death in the family.  No cocaine since, and he had no used any cocaine prior to that since leaving prison.  Echo in 4/19 showed EF 50-55% with mild MR.  Cardiolite in 4/19 showed EF 40% with no perfusion defect (low risk).    He is stable today.  Not using cocaine, ETOH, or cigarettes. He has been out of Coreg and Entresto for a week. No dyspnea on flat ground, he is short of breath walking up stairs.  Poor stamina.  No orthopnea/PND.  No chest pain.  Occasional palpitations. He falls asleep easily during the day and snores.   Labs (9/14): K 3.9, creatinine 1.6 Labs (10/14): K 4, creatinine 1.3 Labs (4/19): K 3.7, creatinine 1.43, LDL 127, HIV negative  ECG (personally reviewed): NSR, BiV pacing  PMH: 1. Hypertension  2. GERD  3. Low back pain  4. LBBB: Newly noted 2011  5. Nonischemic cardiomyopathy: Echo (5/11): EF 45-50%, mild hypokinesis of the apical anteroseptal wall, apical anterior wall, and true apex (LAD distribution), mild LAE.  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  Echo (6/14) with EF 20-25% with inferior/inferoseptal akinesis and hypokinesis elsewhere.  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  - St Jude CRT-D - Echo (4/19) with EF 50-55%, mild MR. - Cardiolite (4/19) with EF 40%, no significant perfusion defect.  6. CKD   Family History:   Mother deceased CHF,DM,HTN  Father with heart trouble...died age 33.  1 sister Crohn's disease  1 sister healthy  1 brother died Prostate cancer/AIDS...was diagnosed with prostate cancer at age 74.  Father and mother both had MIs in their 90s   Social History:  [employer] information officer, does some landscaping now.   HS graduate.  Married  3 kids  Alcohol use-no. None since 2008  Drug use-prior cocaine but rare since 2008. Denies IVDU.  Prior smoker none since 2008.   Review of Systems  All systems reviewed and negative except as per HPI.   Current Outpatient Medications  Medication Sig Dispense Refill  . carvedilol (COREG) 6.25 MG tablet Take 1 tablet (6.25 mg total) by mouth 2 (two) times daily. 60 tablet 3  . sacubitril-valsartan (ENTRESTO) 97-103 MG Take 1 tablet by mouth 2 (two) times daily. 60 tablet 3   No current facility-administered medications for this encounter.     BP 134/82   Pulse 70   Wt 228 lb 3.2 oz (103.5 kg)   SpO2 98%   BMI 34.70 kg/m  General: NAD Neck: No JVD, no thyromegaly or thyroid nodule.  Lungs: Clear to auscultation bilaterally with normal respiratory effort. CV: Nondisplaced PMI.  Heart regular S1/S2, no S3/S4, no murmur.  No peripheral edema.  No carotid bruit.  Normal pedal pulses.  Abdomen: Soft, nontender, no hepatosplenomegaly, no  distention.  Skin: Intact without lesions or rashes.  Neurologic: Alert and oriented x 3.  Psych: Normal affect. Extremities: No clubbing or cyanosis.  HEENT: Normal.   Assessment/Plan: 1. Nonischemic cardiomyopathy: No ETOH currently, used cocaine once recently but denies any further use.  EF 20-25% with LBBB in 2014, had St Jude CRT-D device placed with improvement in LV function.  No significant CAD on cath.  NYHA class II-III symptoms but no volume overload on exam or by Corevue.  4/19 echo showed improvement in EF to 50-55%, while Cardiolite showed EF 40%.  - Restart Entresto 97/103  bid.  - Restart Coreg 6.25 mg bid (I suspect he will not use cocaine again).   2. CKD: Stage 3. BMET today.  3. HTN: restart Coreg and Entresto.  4. OSA: Strongly suspect.  Arrange for sleep study.   [name] 07/06/2018

## 2018-07-08 ENCOUNTER — Other Ambulatory Visit (HOSPITAL_COMMUNITY): Payer: Self-pay

## 2018-07-08 MED ORDER — OMEPRAZOLE 20 MG PO CPDR: 20.0000 mg | DELAYED_RELEASE_CAPSULE | Freq: Every day | ORAL | 5 refills | Status: DC | PRN

## 2018-07-14 ENCOUNTER — Telehealth (HOSPITAL_COMMUNITY): Payer: Self-pay | Admitting: Pharmacist

## 2018-07-14 NOTE — Telephone Encounter (Signed)
Entresto PA approved by [place] Medicaid through 07/03/19.   Ruta Hinds. Velva Harman, PharmD, BCPS, CPP Clinical Pharmacist Phone: [phone] 07/14/2018 10:07 AM

## 2018-07-26 ENCOUNTER — Encounter (HOSPITAL_COMMUNITY): Payer: Self-pay | Admitting: *Deleted

## 2018-07-26 NOTE — Progress Notes (Signed)
Received medical records request from [facility] DDS , CASE # [id number].  Requested records faxed today to:  [phone].  Original request will be scanned to patient's electronic medical record.

## 2018-08-10 ENCOUNTER — Encounter (HOSPITAL_BASED_OUTPATIENT_CLINIC_OR_DEPARTMENT_OTHER)

## 2018-08-15 DIAGNOSIS — Z736 Limitation of activities due to disability: Secondary | ICD-10-CM

## 2018-09-07 ENCOUNTER — Encounter (HOSPITAL_BASED_OUTPATIENT_CLINIC_OR_DEPARTMENT_OTHER): Payer: Medicaid Other

## 2018-09-08 ENCOUNTER — Encounter: Payer: Medicaid Other | Admitting: *Deleted

## 2018-09-08 ENCOUNTER — Telehealth: Payer: Self-pay

## 2018-09-08 NOTE — Telephone Encounter (Signed)
Spoke with pt and reminded pt of remote transmission that is due today. Pt verbalized understanding.   

## 2018-09-13 ENCOUNTER — Encounter: Payer: Medicaid Other | Admitting: *Deleted

## 2018-10-13 ENCOUNTER — Encounter (HOSPITAL_BASED_OUTPATIENT_CLINIC_OR_DEPARTMENT_OTHER): Payer: Medicaid Other

## 2018-10-17 ENCOUNTER — Ambulatory Visit (INDEPENDENT_AMBULATORY_CARE_PROVIDER_SITE_OTHER): Payer: Medicaid Other

## 2018-10-17 DIAGNOSIS — I428 Other cardiomyopathies: Secondary | ICD-10-CM

## 2018-10-19 NOTE — Progress Notes (Signed)
Remote ICD transmission.   

## 2018-10-20 ENCOUNTER — Encounter: Payer: Self-pay | Admitting: Cardiology

## 2018-11-09 ENCOUNTER — Emergency Department (HOSPITAL_COMMUNITY)
Admission: EM | Admit: 2018-11-09 | Discharge: 2018-11-09 | Disposition: A | Discharge status: Home / Self Care | Payer: Medicaid Other | Attending: Emergency Medicine | Admitting: Emergency Medicine

## 2018-11-09 ENCOUNTER — Emergency Department (HOSPITAL_COMMUNITY): Payer: Medicaid Other

## 2018-11-09 ENCOUNTER — Encounter (HOSPITAL_COMMUNITY): Payer: Self-pay | Admitting: Emergency Medicine

## 2018-11-09 ENCOUNTER — Other Ambulatory Visit: Payer: Self-pay

## 2018-11-09 DIAGNOSIS — I5022 Chronic systolic (congestive) heart failure: Secondary | ICD-10-CM | POA: Diagnosis not present

## 2018-11-09 DIAGNOSIS — W208XXA Other cause of strike by thrown, projected or falling object, initial encounter: Secondary | ICD-10-CM | POA: Insufficient documentation

## 2018-11-09 DIAGNOSIS — I2510 Atherosclerotic heart disease of native coronary artery without angina pectoris: Secondary | ICD-10-CM | POA: Diagnosis not present

## 2018-11-09 DIAGNOSIS — F17210 Nicotine dependence, cigarettes, uncomplicated: Secondary | ICD-10-CM | POA: Insufficient documentation

## 2018-11-09 DIAGNOSIS — Y929 Unspecified place or not applicable: Secondary | ICD-10-CM | POA: Insufficient documentation

## 2018-11-09 DIAGNOSIS — Y939 Activity, unspecified: Secondary | ICD-10-CM | POA: Diagnosis not present

## 2018-11-09 DIAGNOSIS — Y999 Unspecified external cause status: Secondary | ICD-10-CM | POA: Insufficient documentation

## 2018-11-09 DIAGNOSIS — I130 Hypertensive heart and chronic kidney disease with heart failure and stage 1 through stage 4 chronic kidney disease, or unspecified chronic kidney disease: Secondary | ICD-10-CM | POA: Diagnosis not present

## 2018-11-09 DIAGNOSIS — S90121A Contusion of right lesser toe(s) without damage to nail, initial encounter: Secondary | ICD-10-CM | POA: Insufficient documentation

## 2018-11-09 DIAGNOSIS — Z79899 Other long term (current) drug therapy: Secondary | ICD-10-CM | POA: Insufficient documentation

## 2018-11-09 DIAGNOSIS — S99921A Unspecified injury of right foot, initial encounter: Secondary | ICD-10-CM | POA: Diagnosis present

## 2018-11-09 DIAGNOSIS — N183 Chronic kidney disease, stage 3 (moderate): Secondary | ICD-10-CM | POA: Diagnosis not present

## 2018-11-09 MED ORDER — TRAMADOL HCL 50 MG PO TABS: 50.0000 mg | ORAL_TABLET | Freq: Four times a day (QID) | ORAL | 0 refills | Status: DC | PRN

## 2018-11-09 NOTE — ED Provider Notes (Signed)
[facility] DEPT Provider Note   CSN: [id number] Arrival date & time: 11/09/18  1741     History   Chief Complaint Chief Complaint  Patient presents with  . Foot Pain    HPI Roger Crosby is a 57 y.o. male.  The history is provided by the patient. No language interpreter was used.  Foot Pain  This is a new problem. The current episode started 1 to 2 hours ago. The problem occurs constantly. The problem has been gradually worsening. Nothing aggravates the symptoms. Nothing relieves the symptoms. He has tried nothing for the symptoms. The treatment provided no relief.  Pt reports a metal pipe fell on his foot.  Pt complains of pain   Past Medical History:  Diagnosis Date  . AICD (automatic cardioverter/defibrillator) present    a.  Unsuccessful LV lead placement 11/2014  . CAD (coronary artery disease)    a. nonobstructive by LHC 6/11: oOM1 40%, mild luminal irregs elsewhere b. cath 09/2014L no obstructive disease c. 03/2016: Low-risk NST  . CHF (congestive heart failure) ([place])   . CKD (chronic kidney disease)   . GERD (gastroesophageal reflux disease)   . High cholesterol   . HTN (hypertension)   . Hx of echocardiogram    echo 5/11: EF 45-50%, mild HK of Ap AS wall, ap Ant wall and true apex (LAD distribution), mild LAE  . LBBB (left bundle branch block)   . LBP (low back pain)   . NICM (nonischemic cardiomyopathy) ([place])   . Systolic heart failure [facility])     Patient Active Problem List   Diagnosis Date Noted  . Chest pain 03/11/2018  . Substance abuse ([place]) 04/08/2016  . Atypical chest pain 04/07/2016  . CKD (chronic kidney disease) stage 3, GFR 30-59 ml/min (HCC) 04/07/2016  . S/P ICD (internal cardiac defibrillator) procedure 02/07/2015  . NICM (nonischemic cardiomyopathy) ([place]) 12/15/2014  . Chronic systolic CHF (congestive heart failure) ([place]) 08/23/2013  . DENTAL PAIN 04/04/2010  . DYSPNEA 04/04/2010  . LBBB (left bundle branch  block) 02/14/2010  . SKIN TAG 02/14/2010  . LEG PAIN 02/14/2010  . HYPERLIPIDEMIA 01/07/2010  . ERECTILE DYSFUNCTION 04/09/2009  . 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 Benign essential HTN 01/28/2009  . GERD 01/28/2009  . LOW BACK PAIN 01/28/2009    Past Surgical History:  Procedure Laterality Date  . BI-VENTRICULAR IMPLANTABLE CARDIOVERTER DEFIBRILLATOR N/A 12/14/2014   with failed LV lead placeemnt  . CARDIAC CATHETERIZATION Left 07/2013   with angiogram  . DEBRIDEMENT AND CLOSURE WOUND Right 09/20/2015   Procedure: DEBRIDEMENT AND CLOSURE WOUND;  Surgeon: Dayna Barker, MD;  Location: [facility];  Service: Plastics;  Laterality: Right;  . EPICARDIAL PACING LEAD PLACEMENT N/A 02/07/2015   eicardial LV lead placed by Dr Laverta Baltimore  . LEFT HEART CATHETERIZATION WITH CORONARY ANGIOGRAM N/A 08/04/2013   Procedure: LEFT HEART CATHETERIZATION WITH CORONARY ANGIOGRAM;  Surgeon: Larey Dresser, MD;  Location: [facility] CATH LAB;  Service: Cardiovascular;  Laterality: N/A;  . TENDON REPAIR Left 09/20/2015   Procedure: repair nerve tendon wrist;  Surgeon: Dayna Barker, MD;  Location: [facility];  Service: Plastics;  Laterality: Left;  . THORACOTOMY Left 02/07/2015   Procedure: THORACOTOMY MAJOR;  Surgeon: Gaye Pollack, MD;  Location: [facility] OR;  Service: Thoracic;  Laterality: Left;        Home Medications    Prior to Admission medications   Medication Sig Start Date End Date Taking? Authorizing Provider  carvedilol (COREG) 6.25 MG  tablet Take 1 tablet (6.25 mg total) by mouth 2 (two) times daily. 07/05/18   Larey Dresser, MD  omeprazole (PRILOSEC) 20 MG capsule Take 1 capsule (20 mg total) by mouth daily as needed (for stomach). 07/08/18   Bensimhon, Shaune Pascal, MD  sacubitril-valsartan (ENTRESTO) 97-103 MG Take 1 tablet by mouth 2 (two) times daily. 07/05/18   Larey Dresser, MD  traMADol (ULTRAM) 50 MG tablet Take 1 tablet (50 mg total) by mouth every 6 (six) hours as needed. 11/09/18    Fransico Meadow, PA-C    Family History Family History  Problem Relation Age of Onset  . Heart attack Mother        in 4's  . Heart disease Mother        ischemic  . Congestive Heart Failure Mother   . Diabetes Mother   . Hypertension Mother   . Heart attack Father        in 45's  . Heart disease Father        ischemic  . Heart Problems Father   . Crohn's disease Sister   . Prostate cancer Brother   . HIV/AIDS Brother     Social History Social History   Tobacco Use  . Smoking status: Current Some Day Smoker    Packs/day: 0.12    Years: 25.00    Pack years: 3.00    Types: Cigarettes  . Smokeless tobacco: Never Used  Substance Use Topics  . Alcohol use: Yes    Comment: social  . Drug use: No    Types: "Crack" cocaine    Comment: 12/14/2014 "last crack was in 2014"     Allergies   Patient has no known allergies.   Review of Systems Review of Systems  Musculoskeletal: Positive for myalgias.  All other systems reviewed and are negative.    Physical Exam Updated Vital Signs BP (!) 157/98 (BP Location: Left Arm)   Pulse 92   Temp 98.9 F (37.2 C) (Oral)   Resp 20   SpO2 98%   Physical Exam  Constitutional: He appears well-developed and well-nourished.  Musculoskeletal: He exhibits tenderness.  Abrasion midlower foot,  Bruising around area  From toes  nv and ns intact   Neurological: He is alert.  Skin: Skin is warm.  Psychiatric: He has a normal mood and affect.  Nursing note and vitals reviewed.    ED Treatments / Results  Labs (all labs ordered are listed, but only abnormal results are displayed) Labs Reviewed - No data to display  EKG None  Radiology Dg Foot Complete Right  Result Date: 11/09/2018 CLINICAL DATA:  Right foot pain after dropping pipe on foot EXAM: RIGHT FOOT COMPLETE - 3+ VIEW COMPARISON:  None. FINDINGS: No fracture or malalignment. Mild degenerative change at the first MTP joint. No radiopaque foreign body. IMPRESSION:  No acute osseous abnormality. Electronically Signed   By: Donavan Foil M.D.   On: 11/09/2018 18:30    Procedures Procedures (including critical care time)  Medications Ordered in ED Medications - No data to display   Initial Impression / Assessment and Plan / ED Course  I have reviewed the triage vital signs and the nursing notes.  Pertinent labs & imaging results that were available during my care of the patient were reviewed by me and considered in my medical decision making (see chart for details).     MDM  No fracture.  Pt placed in a ace wrap and post op shoe.  Final Clinical Impressions(s) / ED Diagnoses   Final diagnoses:  Contusion of fifth toe of right foot, initial encounter    ED Discharge Orders         Ordered    traMADol (ULTRAM) 50 MG tablet  Every 6 hours PRN     11/09/18 1923        An After Visit Summary was printed and given to the patient.    Sidney Ace 11/09/18 2214    Duffy Bruce, MD 11/10/18 [phone]

## 2018-11-09 NOTE — Discharge Instructions (Signed)
See your Physicain for recheck in 1 week if pain persist

## 2018-11-09 NOTE — ED Triage Notes (Signed)
Patient c/o right foot pain after dropping pipe on it x1 hour ago.

## 2018-12-04 ENCOUNTER — Ambulatory Visit (HOSPITAL_BASED_OUTPATIENT_CLINIC_OR_DEPARTMENT_OTHER): Payer: Medicaid Other | Attending: Cardiology | Admitting: Cardiology

## 2018-12-04 VITALS — Ht 68.0 in | Wt 220.0 lb

## 2018-12-04 DIAGNOSIS — G4733 Obstructive sleep apnea (adult) (pediatric): Secondary | ICD-10-CM | POA: Insufficient documentation

## 2018-12-04 DIAGNOSIS — R0902 Hypoxemia: Secondary | ICD-10-CM | POA: Diagnosis not present

## 2018-12-04 DIAGNOSIS — I5022 Chronic systolic (congestive) heart failure: Secondary | ICD-10-CM | POA: Insufficient documentation

## 2018-12-04 DIAGNOSIS — G4731 Primary central sleep apnea: Secondary | ICD-10-CM | POA: Insufficient documentation

## 2018-12-06 NOTE — Procedures (Signed)
   Patient Name: Roger Crosby, Roger Crosby Study Date:11/16/2017 12/04/2018 Gender: Male D.O.B: 1961/10/20 Age (years): 57 Referring Provider: [name] Height (inches): 53 Interpreting Physician: Fransico Him MD, ABSM Weight (lbs): 220 RPSGT: Rebekah Chesterfield BMI: 34 MRN: [id number] Neck Size: 17.00  CLINICAL INFORMATION Sleep Study Type: NPSG  Indication for sleep study: Congestive Heart Failure, Excessive Daytime Sleepiness, Hypertension, Obesity, OSA  Epworth Sleepiness Score: 10  SLEEP STUDY TECHNIQUE As per the AASM Manual for the Scoring of Sleep and Associated Events v2.3 (April 2016) with a hypopnea requiring 4% desaturations.  The channels recorded and monitored were frontal, central and occipital EEG, electrooculogram (EOG), submentalis EMG (chin), nasal and oral airflow, thoracic and abdominal wall motion, anterior tibialis EMG, snore microphone, electrocardiogram, and pulse oximetry.  MEDICATIONS Medications self-administered by patient taken the night of the study : N/A  SLEEP ARCHITECTURE The study was initiated at 11:05:35 PM and ended at 5:08:04 AM.  Sleep onset time was 41.9 minutes and the sleep efficiency was 65.3%%. The total sleep time was 236.5 minutes.  Stage REM latency was 138.5 minutes.  The patient spent 49.1%% of the night in stage N1 sleep, 38.3%% in stage N2 sleep, 0.0%% in stage N3 and 12.7% in REM.  Alpha intrusion was absent.  Supine sleep was 100.00%.  RESPIRATORY PARAMETERS The overall apnea/hypopnea index (AHI) was 57.6 per hour. There were 42 total apneas, including 6 obstructive, 36 central and 0 mixed apneas. There were 185 hypopneas and 2 RERAs.  The AHI during Stage REM sleep was 60.0 per hour.  AHI while supine was 57.6 per hour.  The mean oxygen saturation was 94.6%. The minimum SpO2 during sleep was 81.0%.  soft snoring was noted during this study.  CARDIAC DATA The 2 lead EKG demonstrated sinus rhythm, pacemaker generated.  The mean heart rate was 73.0 beats per minute. Other EKG findings include: PVCs  LEG MOVEMENT DATA The total PLMS were 0 with a resulting PLMS index of 0.0. Associated arousal with leg movement index was 0.0 .  IMPRESSIONS - Severe obstructive sleep apnea occurred during this study (AHI = 57.6/h). - Mild central sleep apnea occurred during this study (CAI = 9.1/h). - Mild oxygen desaturation was noted during this study (Min O2 = 81.0%). - The patient snored with soft snoring volume. - No cardiac abnormalities were noted during this study. - Clinically significant periodic limb movements did not occur during sleep. No significant associated arousals.  DIAGNOSIS - Obstructive Sleep Apnea (327.23 [G47.33 ICD-10]) - Central Sleep Apnea (327.27 [G47.37 ICD-10]) - Nocturnal Hypoxemia (327.26 [G47.36 ICD-10])  RECOMMENDATIONS - CPAP titration to determine optimal pressure required to alleviate sleep disordered breathing. BiPAP or ASV titration may be required to eliminate central sleep apnea. - Positional therapy avoiding supine position during sleep. - Avoid alcohol, sedatives and other CNS depressants that may worsen sleep apnea and disrupt normal sleep architecture. - Sleep hygiene should be reviewed to assess factors that may improve sleep quality. - Weight management and regular exercise should be initiated or continued if appropriate.  [Electronically signed] 12/06/2018 07:57 PM  Fransico Him MD, ABSM Diplomate, American Board of Sleep Medicine

## 2018-12-13 LAB — CUP PACEART REMOTE DEVICE CHECK
Battery Remaining Longevity: 34 mo
Battery Remaining Percentage: 45 %
Battery Voltage: 2.9 V
Brady Statistic AP VP Percent: 1.1 %
Brady Statistic AP VS Percent: 1 %
Brady Statistic AS VP Percent: 96 %
Brady Statistic AS VS Percent: 3.3 %
Brady Statistic RA Percent Paced: 1.1 %
Date Time Interrogation Session: 20191118234512
HighPow Impedance: 63 Ohm
HighPow Impedance: 63 Ohm
Implantable Lead Implant Date: 20160115
Implantable Lead Implant Date: 20160115
Implantable Lead Implant Date: 20160310
Implantable Lead Location: 753858
Implantable Lead Location: 753859
Implantable Lead Location: 753860
Implantable Lead Model: 5076
Implantable Lead Model: 511212
Implantable Lead Serial Number: 247936
Implantable Pulse Generator Implant Date: 20160115
Lead Channel Impedance Value: 350 Ohm
Lead Channel Impedance Value: 410 Ohm
Lead Channel Impedance Value: 450 Ohm
Lead Channel Pacing Threshold Amplitude: 0.25 V
Lead Channel Pacing Threshold Amplitude: 0.5 V
Lead Channel Pacing Threshold Amplitude: 1 V
Lead Channel Pacing Threshold Pulse Width: 0.5 ms
Lead Channel Pacing Threshold Pulse Width: 0.5 ms
Lead Channel Pacing Threshold Pulse Width: 0.5 ms
Lead Channel Sensing Intrinsic Amplitude: 3 mV
Lead Channel Sensing Intrinsic Amplitude: 8.5 mV
Lead Channel Setting Pacing Amplitude: 2 V
Lead Channel Setting Pacing Amplitude: 2 V
Lead Channel Setting Pacing Amplitude: 2.5 V
Lead Channel Setting Pacing Pulse Width: 0.5 ms
Lead Channel Setting Pacing Pulse Width: 0.5 ms
Lead Channel Setting Sensing Sensitivity: 0.5 mV
Pulse Gen Serial Number: 7209167

## 2018-12-19 ENCOUNTER — Telehealth: Payer: Self-pay | Admitting: *Deleted

## 2018-12-19 DIAGNOSIS — G4733 Obstructive sleep apnea (adult) (pediatric): Secondary | ICD-10-CM

## 2018-12-19 NOTE — Telephone Encounter (Signed)
-----   Message from [name], MD sent at 12/06/2018  8:01 PM EST ----- Please let patient know that they have sleep apnea and recommend CPAP titration. Please set up titration in the sleep lab.

## 2018-12-22 ENCOUNTER — Telehealth: Payer: Self-pay | Admitting: *Deleted

## 2018-12-22 NOTE — Telephone Encounter (Addendum)
Informed patient of sleep study results and patient understanding was verbalized. Patient understands his sleep study showed they have sleep apnea and recommend CPAP titration. Please set up titration in the sleep lab. Pt is aware and agreeable to results.

## 2018-12-22 NOTE — Telephone Encounter (Signed)
-----   Message from Freada Bergeron, [place] sent at 12/22/2018  2:52 PM EST ----- Regarding: precert  recommend CPAP titration.

## 2018-12-22 NOTE — Telephone Encounter (Signed)
Staff message sent to Roger Crosby patient has Medicaid and does not require a PA. Ok to schedule CPAP titration.

## 2018-12-28 NOTE — Telephone Encounter (Signed)
Patient is scheduled for CPAP Titration on 02/01/19. Patient understands his titration study will be done at [facility] sleep lab. Patient understands he will receive a letter in a week or so detailing appointment, date, time, and location. Patient understands to call if he does not receive the letter  in a timely manner. Patient agrees with treatment and thanked me for call.

## 2018-12-30 ENCOUNTER — Encounter: Payer: Self-pay | Admitting: *Deleted

## 2019-01-12 ENCOUNTER — Other Ambulatory Visit (HOSPITAL_COMMUNITY): Payer: Self-pay | Admitting: Cardiology

## 2019-01-16 ENCOUNTER — Ambulatory Visit (INDEPENDENT_AMBULATORY_CARE_PROVIDER_SITE_OTHER): Payer: Medicaid Other

## 2019-01-16 DIAGNOSIS — I428 Other cardiomyopathies: Secondary | ICD-10-CM | POA: Diagnosis not present

## 2019-01-18 ENCOUNTER — Ambulatory Visit (INDEPENDENT_AMBULATORY_CARE_PROVIDER_SITE_OTHER): Payer: Medicaid Other

## 2019-01-18 ENCOUNTER — Telehealth: Payer: Self-pay

## 2019-01-18 DIAGNOSIS — I447 Left bundle-branch block, unspecified: Secondary | ICD-10-CM

## 2019-01-18 DIAGNOSIS — I5022 Chronic systolic (congestive) heart failure: Secondary | ICD-10-CM | POA: Diagnosis not present

## 2019-01-18 NOTE — Telephone Encounter (Signed)
Spoke with patient to remind of missed remote transmission 

## 2019-01-20 LAB — CUP PACEART REMOTE DEVICE CHECK
Battery Remaining Longevity: 31 mo
Battery Remaining Percentage: 42 %
Brady Statistic AP VP Percent: 1 %
Brady Statistic AS VS Percent: 3.4 %
HIGH POWER IMPEDANCE MEASURED VALUE: 68 Ohm
HIGH POWER IMPEDANCE MEASURED VALUE: 68 Ohm
Implantable Lead Implant Date: 20160115
Implantable Lead Implant Date: 20160115
Implantable Lead Implant Date: 20160310
Implantable Lead Location: 753858
Implantable Lead Location: 753860
Implantable Lead Model: 5076
Implantable Lead Model: 511212
Implantable Pulse Generator Implant Date: 20160115
Lead Channel Impedance Value: 350 Ohm
Lead Channel Impedance Value: 400 Ohm
Lead Channel Pacing Threshold Amplitude: 1 V
Lead Channel Pacing Threshold Pulse Width: 0.5 ms
Lead Channel Pacing Threshold Pulse Width: 0.5 ms
Lead Channel Sensing Intrinsic Amplitude: 2.9 mV
Lead Channel Setting Pacing Amplitude: 2.5 V
Lead Channel Setting Sensing Sensitivity: 0.5 mV
MDC IDC LEAD LOCATION: 753859
MDC IDC LEAD SERIAL: 247936
MDC IDC MSMT BATTERY VOLTAGE: 2.9 V
MDC IDC MSMT LEADCHNL RA IMPEDANCE VALUE: 440 Ohm
MDC IDC MSMT LEADCHNL RA PACING THRESHOLD AMPLITUDE: 0.5 V
MDC IDC MSMT LEADCHNL RA PACING THRESHOLD PULSEWIDTH: 0.5 ms
MDC IDC MSMT LEADCHNL RV PACING THRESHOLD AMPLITUDE: 0.25 V
MDC IDC MSMT LEADCHNL RV SENSING INTR AMPL: 12 mV
MDC IDC PG SERIAL: 7209167
MDC IDC SESS DTM: 20200219013223
MDC IDC SET LEADCHNL LV PACING AMPLITUDE: 2 V
MDC IDC SET LEADCHNL LV PACING PULSEWIDTH: 0.5 ms
MDC IDC SET LEADCHNL RA PACING AMPLITUDE: 2 V
MDC IDC SET LEADCHNL RV PACING PULSEWIDTH: 0.5 ms
MDC IDC STAT BRADY AP VS PERCENT: 1 %
MDC IDC STAT BRADY AS VP PERCENT: 96 %
MDC IDC STAT BRADY RA PERCENT PACED: 1 %

## 2019-01-25 NOTE — Progress Notes (Signed)
Remote ICD transmission.   

## 2019-01-26 NOTE — Progress Notes (Signed)
Remote ICD transmission.   

## 2019-01-27 ENCOUNTER — Encounter: Payer: Self-pay | Admitting: Cardiology

## 2019-02-01 ENCOUNTER — Encounter (HOSPITAL_BASED_OUTPATIENT_CLINIC_OR_DEPARTMENT_OTHER): Payer: Medicaid Other

## 2019-02-22 ENCOUNTER — Other Ambulatory Visit (HOSPITAL_COMMUNITY): Payer: Self-pay | Admitting: Internal Medicine

## 2019-03-22 ENCOUNTER — Other Ambulatory Visit (HOSPITAL_COMMUNITY): Payer: Self-pay | Admitting: Cardiology

## 2019-03-26 ENCOUNTER — Encounter (HOSPITAL_BASED_OUTPATIENT_CLINIC_OR_DEPARTMENT_OTHER): Payer: Medicaid Other

## 2019-04-17 ENCOUNTER — Encounter (HOSPITAL_BASED_OUTPATIENT_CLINIC_OR_DEPARTMENT_OTHER): Payer: Medicaid Other

## 2019-04-17 ENCOUNTER — Ambulatory Visit (INDEPENDENT_AMBULATORY_CARE_PROVIDER_SITE_OTHER): Payer: Medicaid Other | Admitting: *Deleted

## 2019-04-17 ENCOUNTER — Other Ambulatory Visit: Payer: Self-pay

## 2019-04-17 DIAGNOSIS — I428 Other cardiomyopathies: Secondary | ICD-10-CM

## 2019-04-18 ENCOUNTER — Telehealth: Payer: Self-pay

## 2019-04-18 NOTE — Telephone Encounter (Signed)
Left message for patient to remind of missed remote transmission.  

## 2019-04-19 DIAGNOSIS — I428 Other cardiomyopathies: Secondary | ICD-10-CM | POA: Diagnosis not present

## 2019-04-22 LAB — CUP PACEART REMOTE DEVICE CHECK
Date Time Interrogation Session: 20200523112119
Implantable Lead Implant Date: 20160115
Implantable Lead Implant Date: 20160115
Implantable Lead Implant Date: 20160310
Implantable Lead Location: 753858
Implantable Lead Location: 753859
Implantable Lead Location: 753860
Implantable Lead Model: 5076
Implantable Lead Model: 511212
Implantable Lead Serial Number: 247936
Implantable Pulse Generator Implant Date: 20160115
Pulse Gen Serial Number: 7209167

## 2019-04-26 ENCOUNTER — Encounter: Payer: Self-pay | Admitting: Cardiology

## 2019-04-26 NOTE — Progress Notes (Signed)
Remote ICD transmission.   

## 2019-05-17 ENCOUNTER — Encounter (HOSPITAL_BASED_OUTPATIENT_CLINIC_OR_DEPARTMENT_OTHER): Payer: Medicaid Other

## 2019-06-27 ENCOUNTER — Other Ambulatory Visit (HOSPITAL_COMMUNITY): Payer: Medicaid Other

## 2019-06-30 ENCOUNTER — Encounter (HOSPITAL_BASED_OUTPATIENT_CLINIC_OR_DEPARTMENT_OTHER): Payer: Medicaid Other

## 2019-07-04 ENCOUNTER — Inpatient Hospital Stay (HOSPITAL_COMMUNITY)
Admission: EM | Admit: 2019-07-04 | Discharge: 2019-07-06 | DRG: 918 | Disposition: A | Discharge status: Home / Self Care | Payer: Medicaid Other | Attending: Family Medicine | Admitting: Family Medicine

## 2019-07-04 ENCOUNTER — Encounter (HOSPITAL_COMMUNITY): Payer: Self-pay

## 2019-07-04 ENCOUNTER — Other Ambulatory Visit: Payer: Self-pay

## 2019-07-04 ENCOUNTER — Emergency Department (HOSPITAL_COMMUNITY): Payer: Medicaid Other

## 2019-07-04 ENCOUNTER — Observation Stay (HOSPITAL_BASED_OUTPATIENT_CLINIC_OR_DEPARTMENT_OTHER): Payer: Medicaid Other

## 2019-07-04 DIAGNOSIS — R072 Precordial pain: Secondary | ICD-10-CM

## 2019-07-04 DIAGNOSIS — F1490 Cocaine use, unspecified, uncomplicated: Secondary | ICD-10-CM

## 2019-07-04 DIAGNOSIS — N289 Disorder of kidney and ureter, unspecified: Secondary | ICD-10-CM

## 2019-07-04 DIAGNOSIS — E66811 Obesity, class 1: Secondary | ICD-10-CM | POA: Diagnosis present

## 2019-07-04 DIAGNOSIS — N183 Chronic kidney disease, stage 3 unspecified: Secondary | ICD-10-CM | POA: Diagnosis present

## 2019-07-04 DIAGNOSIS — Z95810 Presence of automatic (implantable) cardiac defibrillator: Secondary | ICD-10-CM

## 2019-07-04 DIAGNOSIS — E785 Hyperlipidemia, unspecified: Secondary | ICD-10-CM | POA: Diagnosis present

## 2019-07-04 DIAGNOSIS — R7989 Other specified abnormal findings of blood chemistry: Secondary | ICD-10-CM

## 2019-07-04 DIAGNOSIS — Z20828 Contact with and (suspected) exposure to other viral communicable diseases: Secondary | ICD-10-CM | POA: Diagnosis present

## 2019-07-04 DIAGNOSIS — T405X1A Poisoning by cocaine, accidental (unintentional), initial encounter: Principal | ICD-10-CM | POA: Diagnosis present

## 2019-07-04 DIAGNOSIS — I5022 Chronic systolic (congestive) heart failure: Secondary | ICD-10-CM | POA: Diagnosis present

## 2019-07-04 DIAGNOSIS — I428 Other cardiomyopathies: Secondary | ICD-10-CM | POA: Diagnosis present

## 2019-07-04 DIAGNOSIS — Z8249 Family history of ischemic heart disease and other diseases of the circulatory system: Secondary | ICD-10-CM

## 2019-07-04 DIAGNOSIS — Z6833 Body mass index (BMI) 33.0-33.9, adult: Secondary | ICD-10-CM

## 2019-07-04 DIAGNOSIS — E669 Obesity, unspecified: Secondary | ICD-10-CM | POA: Diagnosis present

## 2019-07-04 DIAGNOSIS — Z8042 Family history of malignant neoplasm of prostate: Secondary | ICD-10-CM

## 2019-07-04 DIAGNOSIS — R079 Chest pain, unspecified: Secondary | ICD-10-CM | POA: Diagnosis present

## 2019-07-04 DIAGNOSIS — I447 Left bundle-branch block, unspecified: Secondary | ICD-10-CM | POA: Diagnosis present

## 2019-07-04 DIAGNOSIS — F1410 Cocaine abuse, uncomplicated: Secondary | ICD-10-CM | POA: Diagnosis present

## 2019-07-04 DIAGNOSIS — K219 Gastro-esophageal reflux disease without esophagitis: Secondary | ICD-10-CM | POA: Diagnosis present

## 2019-07-04 DIAGNOSIS — I361 Nonrheumatic tricuspid (valve) insufficiency: Secondary | ICD-10-CM | POA: Diagnosis not present

## 2019-07-04 DIAGNOSIS — F17210 Nicotine dependence, cigarettes, uncomplicated: Secondary | ICD-10-CM | POA: Diagnosis present

## 2019-07-04 DIAGNOSIS — Z833 Family history of diabetes mellitus: Secondary | ICD-10-CM

## 2019-07-04 DIAGNOSIS — R778 Other specified abnormalities of plasma proteins: Secondary | ICD-10-CM | POA: Diagnosis present

## 2019-07-04 DIAGNOSIS — I10 Essential (primary) hypertension: Secondary | ICD-10-CM | POA: Diagnosis present

## 2019-07-04 DIAGNOSIS — I130 Hypertensive heart and chronic kidney disease with heart failure and stage 1 through stage 4 chronic kidney disease, or unspecified chronic kidney disease: Secondary | ICD-10-CM | POA: Diagnosis present

## 2019-07-04 DIAGNOSIS — E876 Hypokalemia: Secondary | ICD-10-CM | POA: Diagnosis present

## 2019-07-04 DIAGNOSIS — E7800 Pure hypercholesterolemia, unspecified: Secondary | ICD-10-CM | POA: Diagnosis present

## 2019-07-04 DIAGNOSIS — Z830 Family history of human immunodeficiency virus [HIV] disease: Secondary | ICD-10-CM

## 2019-07-04 DIAGNOSIS — R0789 Other chest pain: Secondary | ICD-10-CM

## 2019-07-04 HISTORY — DX: Cocaine use, unspecified, uncomplicated: F14.90

## 2019-07-04 HISTORY — DX: Other specified abnormal findings of blood chemistry: R79.89

## 2019-07-04 LAB — HEPATIC FUNCTION PANEL
ALT: 16 U/L (ref 0–44)
AST: 16 U/L (ref 15–41)
Albumin: 3.3 g/dL — ABNORMAL LOW (ref 3.5–5.0)
Alkaline Phosphatase: 70 U/L (ref 38–126)
Bilirubin, Direct: 0.1 mg/dL (ref 0.0–0.2)
Indirect Bilirubin: 0.6 mg/dL (ref 0.3–0.9)
Total Bilirubin: 0.7 mg/dL (ref 0.3–1.2)
Total Protein: 6.3 g/dL — ABNORMAL LOW (ref 6.5–8.1)

## 2019-07-04 LAB — CBC WITH DIFFERENTIAL/PLATELET
Abs Immature Granulocytes: 0.04 10*3/uL (ref 0.00–0.07)
Basophils Absolute: 0 10*3/uL (ref 0.0–0.1)
Basophils Relative: 0 %
Eosinophils Absolute: 0.1 10*3/uL (ref 0.0–0.5)
Eosinophils Relative: 0 %
HCT: 46.3 % (ref 39.0–52.0)
Hemoglobin: 15.1 g/dL (ref 13.0–17.0)
Immature Granulocytes: 0 %
Lymphocytes Relative: 17 %
Lymphs Abs: 2 10*3/uL (ref 0.7–4.0)
MCH: 27 pg (ref 26.0–34.0)
MCHC: 32.6 g/dL (ref 30.0–36.0)
MCV: 82.8 fL (ref 80.0–100.0)
Monocytes Absolute: 0.7 10*3/uL (ref 0.1–1.0)
Monocytes Relative: 6 %
Neutro Abs: 8.9 10*3/uL — ABNORMAL HIGH (ref 1.7–7.7)
Neutrophils Relative %: 77 %
Platelets: 305 10*3/uL (ref 150–400)
RBC: 5.59 MIL/uL (ref 4.22–5.81)
RDW: 14.6 % (ref 11.5–15.5)
WBC: 11.7 10*3/uL — ABNORMAL HIGH (ref 4.0–10.5)
nRBC: 0 % (ref 0.0–0.2)

## 2019-07-04 LAB — BASIC METABOLIC PANEL
Anion gap: 15 (ref 5–15)
BUN: 10 mg/dL (ref 6–20)
CO2: 19 mmol/L — ABNORMAL LOW (ref 22–32)
Calcium: 8.9 mg/dL (ref 8.9–10.3)
Chloride: 103 mmol/L (ref 98–111)
Creatinine, Ser: 1.66 mg/dL — ABNORMAL HIGH (ref 0.61–1.24)
GFR calc Af Amer: 52 mL/min — ABNORMAL LOW (ref >60)
GFR calc non Af Amer: 45 mL/min — ABNORMAL LOW (ref >60)
Glucose, Bld: 108 mg/dL — ABNORMAL HIGH (ref 70–99)
Potassium: 3.7 mmol/L (ref 3.5–5.1)
Sodium: 137 mmol/L (ref 135–145)

## 2019-07-04 LAB — URINALYSIS, ROUTINE W REFLEX MICROSCOPIC
Bacteria, UA: NONE SEEN
Bilirubin Urine: NEGATIVE
Glucose, UA: NEGATIVE mg/dL
Ketones, ur: 5 mg/dL — AB
Leukocytes,Ua: NEGATIVE
Nitrite: NEGATIVE
Protein, ur: NEGATIVE mg/dL
Specific Gravity, Urine: 1.006 (ref 1.005–1.030)
pH: 6 (ref 5.0–8.0)

## 2019-07-04 LAB — TROPONIN I (HIGH SENSITIVITY)
Troponin I (High Sensitivity): 28 ng/L — ABNORMAL HIGH (ref <18)
Troponin I (High Sensitivity): 27 ng/L — ABNORMAL HIGH (ref <18)
Troponin I (High Sensitivity): 34 ng/L — ABNORMAL HIGH (ref <18)

## 2019-07-04 LAB — RAPID URINE DRUG SCREEN, HOSP PERFORMED
Amphetamines: NOT DETECTED
Barbiturates: NOT DETECTED
Benzodiazepines: NOT DETECTED
Cocaine: POSITIVE — AB
Opiates: NOT DETECTED
Tetrahydrocannabinol: NOT DETECTED

## 2019-07-04 LAB — CK: Total CK: 197 U/L (ref 49–397)

## 2019-07-04 LAB — ECHOCARDIOGRAM COMPLETE
Height: 68 in
Weight: 3520 oz

## 2019-07-04 LAB — LIPID PANEL
Cholesterol: 189 mg/dL (ref 0–200)
HDL: 32 mg/dL — ABNORMAL LOW (ref >40)
LDL Cholesterol: 141 mg/dL — ABNORMAL HIGH (ref 0–99)
Total CHOL/HDL Ratio: 5.9 RATIO
Triglycerides: 80 mg/dL (ref <150)
VLDL: 16 mg/dL (ref 0–40)

## 2019-07-04 LAB — HEPARIN LEVEL (UNFRACTIONATED)
Heparin Unfractionated: 0.27 IU/mL — ABNORMAL LOW (ref 0.30–0.70)
Heparin Unfractionated: 0.23 IU/mL — ABNORMAL LOW (ref 0.30–0.70)

## 2019-07-04 LAB — SARS CORONAVIRUS 2 BY RT PCR (HOSPITAL ORDER, PERFORMED IN ~~LOC~~ HOSPITAL LAB): SARS Coronavirus 2: NEGATIVE

## 2019-07-04 MED ORDER — SPIRONOLACTONE 12.5 MG HALF TABLET
12.5000 mg | ORAL_TABLET | Freq: Every day | ORAL | Status: DC
  Administered 2019-07-04 – 2019-07-06 (×3): 12.5 mg via ORAL
  Filled 2019-07-04 (×3): qty 1

## 2019-07-04 MED ORDER — MORPHINE SULFATE (PF) 4 MG/ML IV SOLN: 4.0000 mg | Freq: Once | INTRAVENOUS | Status: DC

## 2019-07-04 MED ORDER — HEPARIN BOLUS VIA INFUSION
1000.0000 [IU] | Freq: Once | INTRAVENOUS | Status: AC
  Administered 2019-07-04: 23:00:00 1000 [IU] via INTRAVENOUS
  Filled 2019-07-04: qty 1000

## 2019-07-04 MED ORDER — HEPARIN BOLUS VIA INFUSION
4000.0000 [IU] | Freq: Once | INTRAVENOUS | Status: AC
  Administered 2019-07-04: 4000 [IU] via INTRAVENOUS
  Filled 2019-07-04: qty 4000

## 2019-07-04 MED ORDER — ALUM & MAG HYDROXIDE-SIMETH 200-200-20 MG/5ML PO SUSP: 30.0000 mL | Freq: Four times a day (QID) | ORAL | Status: DC | PRN

## 2019-07-04 MED ORDER — MORPHINE SULFATE (PF) 4 MG/ML IV SOLN
4.0000 mg | Freq: Once | INTRAVENOUS | Status: AC
  Administered 2019-07-04: 07:00:00 4 mg via INTRAVENOUS
  Filled 2019-07-04: qty 1

## 2019-07-04 MED ORDER — LIDOCAINE VISCOUS HCL 2 % MT SOLN: 15.0000 mL | Freq: Four times a day (QID) | OROMUCOSAL | Status: DC | PRN

## 2019-07-04 MED ORDER — SODIUM CHLORIDE 0.9 % IV SOLN
Freq: Once | INTRAVENOUS | Status: AC
  Administered 2019-07-04: 13:00:00 via INTRAVENOUS

## 2019-07-04 MED ORDER — MORPHINE SULFATE (PF) 2 MG/ML IV SOLN
2.0000 mg | Freq: Once | INTRAVENOUS | Status: AC
  Administered 2019-07-04: 12:00:00 2 mg via INTRAVENOUS
  Filled 2019-07-04: qty 1

## 2019-07-04 MED ORDER — HEPARIN (PORCINE) 25000 UT/250ML-% IV SOLN
1500.0000 [IU]/h | INTRAVENOUS | Status: DC
  Administered 2019-07-04: 1100 [IU]/h via INTRAVENOUS
  Filled 2019-07-04 (×2): qty 250

## 2019-07-04 MED ORDER — MORPHINE SULFATE (PF) 4 MG/ML IV SOLN
4.0000 mg | Freq: Once | INTRAVENOUS | Status: AC
  Administered 2019-07-04: 06:00:00 4 mg via INTRAVENOUS
  Filled 2019-07-04: qty 1

## 2019-07-04 MED ORDER — POTASSIUM CHLORIDE CRYS ER 20 MEQ PO TBCR
40.0000 meq | EXTENDED_RELEASE_TABLET | Freq: Once | ORAL | Status: AC
  Administered 2019-07-04: 12:00:00 40 meq via ORAL
  Filled 2019-07-04: qty 2

## 2019-07-04 MED ORDER — SACUBITRIL-VALSARTAN 97-103 MG PO TABS
1.0000 | ORAL_TABLET | Freq: Two times a day (BID) | ORAL | Status: DC
  Administered 2019-07-05 – 2019-07-06 (×3): 1 via ORAL
  Filled 2019-07-04 (×4): qty 1

## 2019-07-04 MED ORDER — MORPHINE SULFATE (PF) 2 MG/ML IV SOLN
2.0000 mg | INTRAVENOUS | Status: DC | PRN
  Administered 2019-07-05 (×2): 2 mg via INTRAVENOUS
  Filled 2019-07-04 (×2): qty 1

## 2019-07-04 MED ORDER — ACETAMINOPHEN 325 MG PO TABS: 650.0000 mg | ORAL_TABLET | ORAL | Status: DC | PRN

## 2019-07-04 MED ORDER — ASPIRIN EC 81 MG PO TBEC
81.0000 mg | DELAYED_RELEASE_TABLET | Freq: Every day | ORAL | Status: DC
  Administered 2019-07-06: 10:00:00 81 mg via ORAL
  Filled 2019-07-04 (×2): qty 1

## 2019-07-04 MED ORDER — HYDRALAZINE HCL 20 MG/ML IJ SOLN: 10.0000 mg | INTRAMUSCULAR | Status: DC | PRN

## 2019-07-04 NOTE — Progress Notes (Addendum)
Evaluated by cardiology who recommended continuing Entresto, and add on spironolactone.  Goal potassium of 4.  Patient had been given 40 mEq of potassium p.o. CK within normal limits.  Lipid panel reveals total cholesterol 189, HDL 32, and LDL 141.  EF was noted to be reduced at 40-45% continuing on heparin drip for now and treating pain symptoms.  May warrant further intervention if symptoms do not appear to be improved.

## 2019-07-04 NOTE — ED Provider Notes (Signed)
[facility] EMERGENCY DEPARTMENT Provider Note   CSN: [id number] Arrival date & time: 07/04/19  0402    History   Chief Complaint Chief Complaint  Patient presents with  . Chest Pain    HPI Roger Crosby is a 58 y.o. male.   The history is provided by the patient.  Chest Pain He has history of hypertension, hyperlipidemia, nonischemic cardiomyopathy, chronic kidney disease and comes in because of chest pain.  Chest pain started in the evening and he describes it as a heavy feeling of someone standing on his chest.  At about 10 PM, he smoked some crack cocaine and symptoms got worse.  There is associated dyspnea and diaphoresis but no nausea.  He was brought in by ambulance and was given aspirin and nitroglycerin with no change in symptoms.  He has had similar pain in the past but does not recall the diagnosis.  He does have an implanted pacemaker defibrillator because of his cardiomyopathy.  He does admit to smoking cigarettes, denies other drug use.  He denies history of diabetes there is no family history of premature coronary atherosclerosis.  Past Medical History:  Diagnosis Date  . AICD (automatic cardioverter/defibrillator) present    a.  Unsuccessful LV lead placement 11/2014  . CAD (coronary artery disease)    a. nonobstructive by LHC 6/11: oOM1 40%, mild luminal irregs elsewhere b. cath 09/2014L no obstructive disease c. 03/2016: Low-risk NST  . CHF (congestive heart failure) ([place])   . CKD (chronic kidney disease)   . GERD (gastroesophageal reflux disease)   . High cholesterol   . HTN (hypertension)   . Hx of echocardiogram    echo 5/11: EF 45-50%, mild HK of Ap AS wall, ap Ant wall and true apex (LAD distribution), mild LAE  . LBBB (left bundle branch block)   . LBP (low back pain)   . NICM (nonischemic cardiomyopathy) ([employer])   . Systolic heart failure [facility])     Patient Active Problem List   Diagnosis Date Noted  . Chest pain 03/11/2018  .  Substance abuse ([place]) 04/08/2016  . Atypical chest pain 04/07/2016  . CKD (chronic kidney disease) stage 3, GFR 30-59 ml/min (HCC) 04/07/2016  . S/P ICD (internal cardiac defibrillator) procedure 02/07/2015  . NICM (nonischemic cardiomyopathy) ([place]) 12/15/2014  . Chronic systolic CHF (congestive heart failure) ([place]) 08/23/2013  . DENTAL PAIN 04/04/2010  . DYSPNEA 04/04/2010  . LBBB (left bundle branch block) 02/14/2010  . SKIN TAG 02/14/2010  . LEG PAIN 02/14/2010  . HYPERLIPIDEMIA 01/07/2010  . ERECTILE DYSFUNCTION 04/09/2009  . 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 Benign essential HTN 01/28/2009  . GERD 01/28/2009  . LOW BACK PAIN 01/28/2009    Past Surgical History:  Procedure Laterality Date  . BI-VENTRICULAR IMPLANTABLE CARDIOVERTER DEFIBRILLATOR N/A 12/14/2014   with failed LV lead placeemnt  . CARDIAC CATHETERIZATION Left 07/2013   with angiogram  . DEBRIDEMENT AND CLOSURE WOUND Right 09/20/2015   Procedure: DEBRIDEMENT AND CLOSURE WOUND;  Surgeon: Dayna Barker, MD;  Location: [place];  Service: Plastics;  Laterality: Right;  . EPICARDIAL PACING LEAD PLACEMENT N/A 02/07/2015   eicardial LV lead placed by Dr Laverta Baltimore  . LEFT HEART CATHETERIZATION WITH CORONARY ANGIOGRAM N/A 08/04/2013   Procedure: LEFT HEART CATHETERIZATION WITH CORONARY ANGIOGRAM;  Surgeon: Larey Dresser, MD;  [facility] C[facility] LAB;  Service: Cardiovascular;  Laterality: N/A;  . TENDON REPAIR Left 09/20/2015   Procedure: repair nerve tendon wrist;  Surgeon: Erskine Emery  Lenon Curt, MD;  Location: [place];  Service: Plastics;  Laterality: Left;  . THORACOTOMY Left 02/07/2015   Procedure: THORACOTOMY MAJOR;  Surgeon: Gaye Pollack, MD;  Location: [facility] OR;  Service: Thoracic;  Laterality: Left;        Home Medications    Prior to Admission medications   Medication Sig Start Date End Date Taking? Authorizing Provider  carvedilol (COREG) 6.25 MG tablet TAKE 1 TABLET BY MOUTH TWICE A DAY 03/22/19    Larey Dresser, MD  ENTRESTO 97-103 MG TAKE 1 TABLET BY MOUTH TWICE A DAY 01/12/19   Larey Dresser, MD  omeprazole (PRILOSEC) 20 MG capsule TAKE 1 CAPSULE (20 MG TOTAL) BY MOUTH DAILY AS NEEDED (FOR STOMACH). 02/22/19   Bensimhon, Shaune Pascal, MD  traMADol (ULTRAM) 50 MG tablet Take 1 tablet (50 mg total) by mouth every 6 (six) hours as needed. 11/09/18   Fransico Meadow, PA-C    Family History Family History  Problem Relation Age of Onset  . Heart attack Mother        in 19's  . Heart disease Mother        ischemic  . Congestive Heart Failure Mother   . Diabetes Mother   . Hypertension Mother   . Heart attack Father        in 67's  . Heart disease Father        ischemic  . Heart Problems Father   . Crohn's disease Sister   . Prostate cancer Brother   . HIV/AIDS Brother     Social History Social History   Tobacco Use  . Smoking status: Current Some Day Smoker    Packs/day: 0.12    Years: 25.00    Pack years: 3.00    Types: Cigarettes  . Smokeless tobacco: Never Used  Substance Use Topics  . Alcohol use: Yes    Comment: social  . Drug use: No    Types: "Crack" cocaine    Comment: 12/14/2014 "last crack was in 2014"     Allergies   Patient has no known allergies.   Review of Systems Review of Systems  Cardiovascular: Positive for chest pain.  All other systems reviewed and are negative.    Physical Exam Updated Vital Signs BP 123/69 (BP Location: Right Arm)   Pulse (!) 107   Temp 98.8 F (37.1 C) (Oral)   Resp (!) 24   Ht 5\' 8"  (1.727 m)   Wt 99.8 kg   SpO2 96%   BMI 33.45 kg/m   Physical Exam Vitals signs and nursing note reviewed.    58 year old male, resting comfortably and in no acute distress. Vital signs are significant for elevated heart rate and respiratory rate. Oxygen saturation is 96%, which is normal. Head is normocephalic and atraumatic. PERRLA, EOMI. Oropharynx is clear. Neck is nontender and supple without adenopathy or JVD. Back  is nontender and there is no CVA tenderness. Lungs are clear without rales, wheezes, or rhonchi. Chest is nontender. Heart has regular rate and rhythm without murmur. Abdomen is soft, flat, nontender without masses or hepatosplenomegaly and peristalsis is normoactive. Extremities have no cyanosis or edema, full range of motion is present. Skin is warm and dry without rash. Neurologic: Mental status is normal, cranial nerves are intact, there are no motor or sensory deficits.  ED Treatments / Results  Labs (all labs ordered are listed, but only abnormal results are displayed) Labs Reviewed  BASIC METABOLIC PANEL - Abnormal; Notable for  the following components:      Result Value   CO2 19 (*)    Glucose, Bld 108 (*)    Creatinine, Ser 1.66 (*)    GFR calc non Af Amer 45 (*)    GFR calc Af Amer 52 (*)    All other components within normal limits  CBC WITH DIFFERENTIAL/PLATELET - Abnormal; Notable for the following components:   WBC 11.7 (*)    Neutro Abs 8.9 (*)    All other components within normal limits  RAPID URINE DRUG SCREEN, HOSP PERFORMED - Abnormal; Notable for the following components:   Cocaine POSITIVE (*)    All other components within normal limits  TROPONIN I (HIGH SENSITIVITY) - Abnormal; Notable for the following components:   Troponin I (High Sensitivity) 28 (*)    All other components within normal limits  SARS CORONAVIRUS 2 (HOSPITAL ORDER, [place] LAB)  HEPARIN LEVEL (UNFRACTIONATED)  TROPONIN I (HIGH SENSITIVITY)    EKG EKG Interpretation  Date/Time:  Tuesday July 04 2019 04:10:10 EDT Ventricular Rate:  120 PR Interval:    QRS Duration: 117 QT Interval:  373 QTC Calculation: 514 R Axis:   -70 Text Interpretation:  Sinus tachycardia Multiple ventricular premature complexes Left anterior fascicular block LVH with secondary repolarization abnormality Anterior Q waves, possibly due to LVH When compared with ECG of 07/05/2018,  HEART RATE has increased Confirmed by Delora Fuel ([id number]) on 07/04/2019 4:15:44 AM   Radiology Dg Chest Port 1 View  Result Date: 07/04/2019 CLINICAL DATA:  Chest pain EXAM: PORTABLE CHEST 1 VIEW COMPARISON:  Chest CT 03/11/2018 FINDINGS: Biventricular pacer including direct epicardial lead along the left heart border. Artifact from EKG leads. Normal heart size and mediastinal contours. No acute infiltrate or edema. No effusion or pneumothorax. No acute osseous findings. IMPRESSION: No evidence of active disease. Electronically Signed   By: Monte Fantasia M.D.   On: 07/04/2019 04:50    Procedures Procedures  CRITICAL CARE Performed by: Delora Fuel Total critical care time: 45 minutes Critical care time was exclusive of separately billable procedures and treating other patients. Critical care was necessary to treat or prevent imminent or life-threatening deterioration. Critical care was time spent personally by me on the following activities: development of treatment plan with patient and/or surrogate as well as nursing, discussions with consultants, evaluation of patient's response to treatment, examination of patient, obtaining history from patient or surrogate, ordering and performing treatments and interventions, ordering and review of laboratory studies, ordering and review of radiographic studies, pulse oximetry and re-evaluation of patient's condition.  Medications Ordered in ED Medications  morphine 4 MG/ML injection 4 mg (has no administration in time range)  morphine 4 MG/ML injection 4 mg (has no administration in time range)  heparin bolus via infusion 4,000 Units (has no administration in time range)  heparin ADULT infusion 100 units/mL (25000 units/261mL sodium chloride 0.45%) (has no administration in time range)  morphine 4 MG/ML injection 4 mg (4 mg Intravenous Given 07/04/19 0531)     Initial Impression / Assessment and Plan / ED Course  I have reviewed the triage vital  signs and the nursing notes.  Pertinent labs & imaging results that were available during my care of the patient were reviewed by me and considered in my medical decision making (see chart for details).  Chest pain of uncertain cause.  Worsening after cocaine use is worrisome for coronary artery disease.  Old records reviewed, and he had a cardiac  catheterization in 2011 which showed nonobstructive disease, although there was a 40% lesion of the obtuse marginal artery.  Because he failed to respond to nitroglycerin, will give morphine for pain.  Heart score is 5 which puts him at elevated risk for major adverse cardiac events in the next 6 weeks.  ECG shows no acute changes.  Troponin is pending.  Labs show renal insufficiency not significantly changed from baseline.  Troponin is in the indeterminate range, delta troponin pending.  Drug screen is positive for cocaine, consistent with his history.  He did not have significant relief of pain with morphine and is given additional morphine.  His pacemaker had been interrogated and did show he had a run of atrial tachycardia but no ventricular tachycardia or ventricular fibrillation or atrial fibrillation.  Because of persistent pain, he was started on heparin for possible ACS and given additional morphine.  Case is discussed with Dr. Blaine Hamper of [facility] hospitalist, who agrees to admit the patient.  Final Clinical Impressions(s) / ED Diagnoses   Final diagnoses:  Chest heaviness  Cocaine abuse [facility])  Renal insufficiency    ED Discharge Orders    None       Delora Fuel, MD 24/23[birth year]53 [phone]

## 2019-07-04 NOTE — Plan of Care (Signed)

## 2019-07-04 NOTE — Progress Notes (Signed)
[facility] for heparin drip Indication: ACS/STEMI  No Known Allergies  Patient Measurements: Height: 5\' 8"  (172.7 cm) Weight: 221 lb (100.2 kg) IBW/kg (Calculated) : 68.4 Heparin Dosing Weight: 89.8  Vital Signs: Temp: 98.3 F (36.8 C) (08/04 1951) Temp Source: Oral (08/04 1951) BP: 137/90 (08/04 1951) Pulse Rate: 84 (08/04 1951)  Labs: Recent Labs    07/04/19 0415 07/04/19 0655 07/04/19 1123 07/04/19 1137 07/04/19 2142  HGB 15.1  --   --   --   --   HCT 46.3  --   --   --   --   PLT 305  --   --   --   --   HEPARINUNFRC  --   --  0.27*  --  0.23*  CREATININE 1.66*  --   --   --   --   CKTOTAL  --   --  197  --   --   TROPONINIHS 28* 27*  --  34*  --     Estimated Creatinine Clearance: 55.6 mL/min (A) (by C-G formula based on SCr of 1.66 mg/dL (H)).   Medical History: Past Medical History:  Diagnosis Date  . AICD (automatic cardioverter/defibrillator) present    a.  Unsuccessful LV lead placement 11/2014  . CAD (coronary artery disease)    a. nonobstructive by LHC 6/11: oOM1 40%, mild luminal irregs elsewhere b. cath 09/2014L no obstructive disease c. 03/2016: Low-risk NST  . CHF (congestive heart failure) ([place])   . CKD (chronic kidney disease)   . GERD (gastroesophageal reflux disease)   . High cholesterol   . HTN (hypertension)   . Hx of echocardiogram    echo 5/11: EF 45-50%, mild HK of Ap AS wall, ap Ant wall and true apex (LAD distribution), mild LAE  . LBBB (left bundle branch block)   . LBP (low back pain)   . NICM (nonischemic cardiomyopathy) ([place])   . Systolic heart failure [facility])     Assessment: 58 yo male with chest pain which worsened after taking cocaine. No PTA anticoagulation noted.  -heparin level= 0.23 after increase to 1300 units/hr   Goal of Therapy:  Heparin level 0.3-0.7 units/ml Monitor platelets by anticoagulation protocol: Yes   Plan:  -heparin bolus 1000 units IV then increase infusion to  1500 units/hr -Heparin level in 6 hours and daily wth CBC daily  Hildred Laser, PharmD Clinical Pharmacist **Pharmacist phone directory can now be found on [email or web address] ([email or web address]).  Listed under [place].

## 2019-07-04 NOTE — Progress Notes (Signed)
ANTICOAGULATION CONSULT NOTE - Initial Consult  Pharmacy Consult for heparin drip Indication: ACS/STEMI  No Known Allergies  Patient Measurements: Height: 5\' 8"  (172.7 cm) Weight: 220 lb (99.8 kg) IBW/kg (Calculated) : 68.4 Heparin Dosing Weight: 89.8  Vital Signs: Temp: 98.8 F (37.1 C) (08/04 0416) Temp Source: Oral (08/04 0416) BP: 122/83 (08/04 0518) Pulse Rate: 93 (08/04 0518)  Labs: Recent Labs    07/04/19 0415  HGB 15.1  HCT 46.3  PLT 305  CREATININE 1.66*  TROPONINIHS 28*    Estimated Creatinine Clearance: 55.6 mL/min (A) (by C-G formula based on SCr of 1.66 mg/dL (H)).   Medical History: Past Medical History:  Diagnosis Date  . AICD (automatic cardioverter/defibrillator) present    a.  Unsuccessful LV lead placement 11/2014  . CAD (coronary artery disease)    a. nonobstructive by LHC 6/11: oOM1 40%, mild luminal irregs elsewhere b. cath 09/2014L no obstructive disease c. 03/2016: Low-risk NST  . CHF (congestive heart failure) ([place])   . CKD (chronic kidney disease)   . GERD (gastroesophageal reflux disease)   . High cholesterol   . HTN (hypertension)   . Hx of echocardiogram    echo 5/11: EF 45-50%, mild HK of Ap AS wall, ap Ant wall and true apex (LAD distribution), mild LAE  . LBBB (left bundle branch block)   . LBP (low back pain)   . NICM (nonischemic cardiomyopathy) ([place])   . Systolic heart failure [facility])     Assessment: 58 yo male with chest pain which worsened after taking cocaine. No PTA anticoagulation noted. CBC stable  Goal of Therapy:  Heparin level 0.3-0.7 units/ml Monitor platelets by anticoagulation protocol: Yes   Plan:  Heparin 4000 units IV x 1,  Heparin drip at 1100 units/hr Heparin level in 6 hours Heparin level, CBC daily Monitor for bleeding  Ryoma Nofziger A. Levada Dy, PharmD, BCPS, FNKF Clinical Pharmacist [place] Please utilize Amion for appropriate phone number to reach the unit pharmacist ([place])   07/04/2019,6:01 AM

## 2019-07-04 NOTE — Progress Notes (Signed)
  Echocardiogram 2D Echocardiogram has been performed.  Roger Crosby 07/04/2019, 10:49 AM

## 2019-07-04 NOTE — ED Notes (Signed)
ED TO INPATIENT HANDOFF REPORT  ED Nurse Name and Phone #:  Elmyra Ricks [place] Name/Age/Gender Roger Crosby 58 y.o. male Room/Bed: 038C/038C  Code Status   Code Status: Full Code  Home/SNF/Other Home Patient oriented to: self, place, time and situation Is this baseline? Yes   Triage Complete: Triage complete  Chief Complaint cp  Triage Note Pt. Came in EMS with c/o of substernal chest pain, no radiating, 9/10 pain, that increases with movement. Chest pain started around 1400 yesterday. Pt. Smoked cocaine around  2200 last night. 3 Nitro and 324mg  asa given in route and no relief of pain.Temporal Temp 97.6.   Allergies No Known Allergies  Level of Care/Admitting Diagnosis ED Disposition    ED Disposition Condition Comment   Admit  Hospital Area: [place] [[id number]]  Level of Care: Progressive [102]  I expect the patient will be discharged within 24 hours: No (not a candidate for 5C-Observation unit)  Covid Evaluation: Asymptomatic Screening Protocol (No Symptoms)  Diagnosis: Chest pain [196222]  Admitting Physician: Ivor Costa [[id number]]  Attending Physician: Ivor Costa [[id number]]  PT Class (Do Not Modify): Observation [104]  PT Acc Code (Do Not Modify): Observation [10022]       B Medical/Surgery History Past Medical History:  Diagnosis Date  . AICD (automatic cardioverter/defibrillator) present    a.  Unsuccessful LV lead placement 11/2014  . CAD (coronary artery disease)    a. nonobstructive by LHC 6/11: oOM1 40%, mild luminal irregs elsewhere b. cath 09/2014L no obstructive disease c. 03/2016: Low-risk NST  . CHF (congestive heart failure) ([place])   . CKD (chronic kidney disease)   . GERD (gastroesophageal reflux disease)   . High cholesterol   . HTN (hypertension)   . Hx of echocardiogram    echo 5/11: EF 45-50%, mild HK of Ap AS wall, ap Ant wall and true apex (LAD distribution), mild LAE  . LBBB (left bundle branch block)   . LBP (low back  pain)   . NICM (nonischemic cardiomyopathy) ([place])   . Systolic heart failure [facility])    Past Surgical History:  Procedure Laterality Date  . Crosby-VENTRICULAR IMPLANTABLE CARDIOVERTER DEFIBRILLATOR N/A 12/14/2014   with failed LV lead placeemnt  . CARDIAC CATHETERIZATION Left 07/2013   with angiogram  . DEBRIDEMENT AND CLOSURE WOUND Right 09/20/2015   Procedure: DEBRIDEMENT AND CLOSURE WOUND;  Surgeon: Dayna Barker, MD;  Location: [facility];  Service: Plastics;  Laterality: Right;  . EPICARDIAL PACING LEAD PLACEMENT N/A 02/07/2015   eicardial LV lead placed by Dr Laverta Baltimore  . LEFT HEART CATHETERIZATION WITH CORONARY ANGIOGRAM N/A 08/04/2013   Procedure: LEFT HEART CATHETERIZATION WITH CORONARY ANGIOGRAM;  Surgeon: Larey Dresser, MD;  Location: [facility] CATH LAB;  Service: Cardiovascular;  Laterality: N/A;  . TENDON REPAIR Left 09/20/2015   Procedure: repair nerve tendon wrist;  Surgeon: Dayna Barker, MD;  Location: [facility];  Service: Plastics;  Laterality: Left;  . THORACOTOMY Left 02/07/2015   Procedure: THORACOTOMY MAJOR;  Surgeon: Gaye Pollack, MD;  Location: [facility] OR;  Service: Thoracic;  Laterality: Left;     A IV Location/Drains/Wounds Patient Lines/Drains/Airways Status   Active Line/Drains/Airways    Name:   Placement date:   Placement time:   Site:   Days:   Peripheral IV 07/04/19 Left Antecubital   07/04/19    0642    Antecubital   less than 1          Intake/Output Last 24 hours No intake or output  data in the 24 hours ending 07/04/19 0942  Labs/Imaging Results for orders placed or performed during the hospital encounter of 07/04/19 (from the past 48 hour(s))  Basic metabolic panel     Status: Abnormal   Collection Time: 07/04/19  4:15 AM  Result Value Ref Range   Sodium 137 135 - 145 mmol/L   Potassium 3.7 3.5 - 5.1 mmol/L   Chloride 103 98 - 111 mmol/L   CO2 19 (L) 22 - 32 mmol/L   Glucose, Bld 108 (H) 70 - 99 mg/dL   BUN 10 6 - 20 mg/dL   Creatinine, Ser 1.66 (H) 0.61 - 1.24  mg/dL   Calcium 8.9 8.9 - 10.3 mg/dL   GFR calc non Af Amer 45 (L) >60 mL/min   GFR calc Af Amer 52 (L) >60 mL/min   Anion gap 15 5 - 15    Comment: Performed at [facility] [address] [place], [place] [address]  CBC with Differential     Status: Abnormal   Collection Time: 07/04/19  4:15 AM  Result Value Ref Range   WBC 11.7 (H) 4.0 - 10.5 K/uL   RBC 5.59 4.22 - 5.81 MIL/uL   Hemoglobin 15.1 13.0 - 17.0 g/dL   HCT 46.3 39.0 - 52.0 %   MCV 82.8 80.0 - 100.0 fL   MCH 27.0 26.0 - 34.0 pg   MCHC 32.6 30.0 - 36.0 g/dL   RDW 14.6 11.5 - 15.5 %   Platelets 305 150 - 400 K/uL   nRBC 0.0 0.0 - 0.2 %   Neutrophils Relative % 77 %   Neutro Abs 8.9 (H) 1.7 - 7.7 K/uL   Lymphocytes Relative 17 %   Lymphs Abs 2.0 0.7 - 4.0 K/uL   Monocytes Relative 6 %   Monocytes Absolute 0.7 0.1 - 1.0 K/uL   Eosinophils Relative 0 %   Eosinophils Absolute 0.1 0.0 - 0.5 K/uL   Basophils Relative 0 %   Basophils Absolute 0.0 0.0 - 0.1 K/uL   Immature Granulocytes 0 %   Abs Immature Granulocytes 0.04 0.00 - 0.07 K/uL    Comment: Performed at [facility] [address], [place], [place] [address]  Troponin I (High Sensitivity)     Status: Abnormal   Collection Time: 07/04/19  4:15 AM  Result Value Ref Range   Troponin I (High Sensitivity) 28 (H) <18 ng/L    Comment: (NOTE) Elevated high sensitivity troponin I (hsTnI) values and significant  changes across serial measurements may suggest ACS but many other  chronic and acute conditions are known to elevate hsTnI results.  Refer to the "Links" section for chest pain algorithms and additional  guidance. Performed at [facility], [place] [address], [place], [place] [address]   Urine rapid drug screen (hosp performed)     Status: Abnormal   Collection Time: 07/04/19  4:25 AM  Result Value Ref Range   Opiates NONE DETECTED NONE DETECTED   Cocaine POSITIVE (A) NONE DETECTED   Benzodiazepines NONE DETECTED NONE DETECTED    Amphetamines NONE DETECTED NONE DETECTED   Tetrahydrocannabinol NONE DETECTED NONE DETECTED   Barbiturates NONE DETECTED NONE DETECTED    Comment: (NOTE) DRUG SCREEN FOR MEDICAL PURPOSES ONLY.  IF CONFIRMATION IS NEEDED FOR ANY PURPOSE, NOTIFY LAB WITHIN 5 DAYS. LOWEST DETECTABLE LIMITS FOR URINE DRUG SCREEN Drug Class  Cutoff (ng/mL) Amphetamine and metabolites    1000 Barbiturate and metabolites    200 Benzodiazepine                 892 Tricyclics and metabolites     300 Opiates and metabolites        300 Cocaine and metabolites        300 THC                            50 Performed at [facility], [place] [address] [place], [place] [address]   Troponin I (High Sensitivity)     Status: Abnormal   Collection Time: 07/04/19  6:55 AM  Result Value Ref Range   Troponin I (High Sensitivity) 27 (H) <18 ng/L    Comment: (NOTE) Elevated high sensitivity troponin I (hsTnI) values and significant  changes across serial measurements may suggest ACS but many other  chronic and acute conditions are known to elevate hsTnI results.  Refer to the "Links" section for chest pain algorithms and additional  guidance. Performed at [facility], [place] [address], [place], [place] [address]   SARS Coronavirus 2 [facility] order, Performed in [facility] hospital lab) Nasopharyngeal Nasopharyngeal Swab     Status: None   Collection Time: 07/04/19  6:58 AM   Specimen: Nasopharyngeal Swab  Result Value Ref Range   SARS Coronavirus 2 NEGATIVE NEGATIVE    Comment: (NOTE) If result is NEGATIVE SARS-CoV-2 target nucleic acids are NOT DETECTED. The SARS-CoV-2 RNA is generally detectable in upper and lower  respiratory specimens during the acute phase of infection. The lowest  concentration of SARS-CoV-2 viral copies this assay can detect is 250  copies / mL. A negative result does not preclude SARS-CoV-2 infection  and should not be used as the sole basis for treatment or  other  patient management decisions.  A negative result may occur with  improper specimen collection / handling, submission of specimen other  than nasopharyngeal swab, presence of viral mutation(s) within the  areas targeted by this assay, and inadequate number of viral copies  (<250 copies / mL). A negative result must be combined with clinical  observations, patient history, and epidemiological information. If result is POSITIVE SARS-CoV-2 target nucleic acids are DETECTED. The SARS-CoV-2 RNA is generally detectable in upper and lower  respiratory specimens dur ing the acute phase of infection.  Positive  results are indicative of active infection with SARS-CoV-2.  Clinical  correlation with patient history and other diagnostic information is  necessary to determine patient infection status.  Positive results do  not rule out bacterial infection or co-infection with other viruses. If result is PRESUMPTIVE POSTIVE SARS-CoV-2 nucleic acids MAY BE PRESENT.   A presumptive positive result was obtained on the submitted specimen  and confirmed on repeat testing.  While 2019 novel coronavirus  (SARS-CoV-2) nucleic acids may be present in the submitted sample  additional confirmatory testing may be necessary for epidemiological  and / or clinical management purposes  to differentiate between  SARS-CoV-2 and other Sarbecovirus currently known to infect humans.  If clinically indicated additional testing with an alternate test  methodology [phone]) is advised. The SARS-CoV-2 RNA is generally  detectable in upper and lower respiratory sp ecimens during the acute  phase of infection. The expected result is Negative. Fact Sheet for Patients:  StrictlyIdeas.no Fact Sheet for Healthcare Providers: BankingDealers.co.za This test is not yet approved or  cleared by the [place] and has been authorized for detection and/or diagnosis of  SARS-CoV-2 by FDA under an Emergency Use Authorization (EUA).  This EUA will remain in effect (meaning this test can be used) for the duration of the COVID-19 declaration under Section 564(b)(1) of the Act, 21 U.S.C. section 360bbb-3(b)(1), unless the authorization is terminated or revoked sooner. Performed at [facility], [place] [address], [place],  [address]    Dg Chest Port 1 View  Result Date: 07/04/2019 CLINICAL DATA:  Chest pain EXAM: PORTABLE CHEST 1 VIEW COMPARISON:  Chest CT 03/11/2018 FINDINGS: Biventricular pacer including direct epicardial lead along the left heart border. Artifact from EKG leads. Normal heart size and mediastinal contours. No acute infiltrate or edema. No effusion or pneumothorax. No acute osseous findings. IMPRESSION: No evidence of active disease. Electronically Signed   By: Monte Fantasia M.D.   On: 07/04/2019 04:50    Pending Labs Unresulted Labs (From admission, onward)    Start     Ordered   07/05/19 0500  Heparin level (unfractionated)  Daily,   R     07/04/19 0608   07/05/19 0500  CBC  Daily,   R     07/04/19 0608   07/04/19 1230  Heparin level (unfractionated)  Once-Timed,   STAT     07/04/19 0608   07/04/19 0931  HIV antibody (Routine Testing)  Once,   STAT     07/04/19 0932   07/04/19 0923  Urinalysis, Routine w reflex microscopic  ONCE - STAT,   STAT     07/04/19 0922   07/04/19 0915  Hepatic function panel  Add-on,   AD     07/04/19 0914   07/04/19 0915  CK  Add-on,   AD     07/04/19 0914          Vitals/Pain Today's Vitals   07/04/19 0830 07/04/19 0900 07/04/19 0915 07/04/19 0930  BP: (!) 141/92 137/86 133/86 133/85  Pulse: 77 78 78 76  Resp: 20 17 18 18   Temp:      TempSrc:      SpO2: 94% 94% 95% 100%  Weight:      Height:      PainSc:        Isolation Precautions No active isolations  Medications Medications  morphine 4 MG/ML injection 4 mg (has no administration in time range)  heparin ADULT infusion  100 units/mL (25000 units/218mL sodium chloride 0.45%) (1,100 Units/hr Intravenous New Bag/Given 07/04/19 0646)  acetaminophen (TYLENOL) tablet 650 mg (has no administration in time range)  alum & mag hydroxide-simeth (MAALOX/MYLANTA) 200-200-20 MG/5ML suspension 30 mL (has no administration in time range)    And  lidocaine (XYLOCAINE) 2 % viscous mouth solution 15 mL (has no administration in time range)  morphine 4 MG/ML injection 4 mg (4 mg Intravenous Given 07/04/19 0531)  morphine 4 MG/ML injection 4 mg (4 mg Intravenous Given 07/04/19 0638)  heparin bolus via infusion 4,000 Units (4,000 Units Intravenous Bolus from Bag 07/04/19 0650)    Mobility walks     Focused Assessments Cardiac Assessment Handoff:  Cardiac Rhythm: Normal sinus rhythm Lab Results  Component Value Date   TROPONINI <0.03 03/11/2018   Lab Results  Component Value Date   DDIMER 0.51 (H) 03/11/2018   Does the Patient currently have chest pain? No     R Recommendations: See Admitting Provider Note  Report given to:   Additional Notes:

## 2019-07-04 NOTE — Progress Notes (Signed)
[place] for heparin drip Indication: ACS/STEMI  No Known Allergies  Patient Measurements: Height: 5\' 8"  (172.7 cm) Weight: 221 lb (100.2 kg) IBW/kg (Calculated) : 68.4 Heparin Dosing Weight: 89.8  Vital Signs: Temp: 98.4 F (36.9 C) (08/04 1523) Temp Source: Oral (08/04 1523) BP: 137/83 (08/04 1523) Pulse Rate: 84 (08/04 1523)  Labs: Recent Labs    07/04/19 0415 07/04/19 0655 07/04/19 1123 07/04/19 1137  HGB 15.1  --   --   --   HCT 46.3  --   --   --   PLT 305  --   --   --   HEPARINUNFRC  --   --  0.27*  --   CREATININE 1.66*  --   --   --   CKTOTAL  --   --  197  --   TROPONINIHS 28* 27*  --  34*    Estimated Creatinine Clearance: 55.6 mL/min (A) (by C-G formula based on SCr of 1.66 mg/dL (H)).   Medical History: Past Medical History:  Diagnosis Date  . AICD (automatic cardioverter/defibrillator) present    a.  Unsuccessful LV lead placement 11/2014  . CAD (coronary artery disease)    a. nonobstructive by LHC 6/11: oOM1 40%, mild luminal irregs elsewhere b. cath 09/2014L no obstructive disease c. 03/2016: Low-risk NST  . CHF (congestive heart failure) ([place])   . CKD (chronic kidney disease)   . GERD (gastroesophageal reflux disease)   . High cholesterol   . HTN (hypertension)   . Hx of echocardiogram    echo 5/11: EF 45-50%, mild HK of Ap AS wall, ap Ant wall and true apex (LAD distribution), mild LAE  . LBBB (left bundle branch block)   . LBP (low back pain)   . NICM (nonischemic cardiomyopathy) ([place])   . Systolic heart failure [facility])     Assessment: 58 yo male with chest pain which worsened after taking cocaine. No PTA anticoagulation noted. CBC stable  Heparin level slightly below goal this afternoon.  No overt bleeding or complications noted.  Goal of Therapy:  Heparin level 0.3-0.7 units/ml Monitor platelets by anticoagulation protocol: Yes   Plan:  Increase IV heparin to 1300 units/hr. Recheck heparin  level in 6 hrs. Daily heparin level and CBC. F/u plans for cardiac cath?  Marguerite Olea, [facility] Clinical Pharmacist Phone [phone]  07/04/2019 3:27 PM

## 2019-07-04 NOTE — H&P (Signed)
History and Physical    Roger Crosby POE:[id number] DOB: [birth year] DOA: 07/04/2019  Referring MD/NP/PA: Ivor Costa, MD PCP: Patient, No Pcp Per  Patient coming from: Home via EMS  Chief Complaint: Chest pain  I have personally briefly reviewed patient's old medical records in [place]   HPI: Roger Crosby is a 58 y.o. male with medical history significant of tension, hyperlipidemia, chronic systolic congestive heart failure last EF 50 to 55% with grade 1 diastolic function 3154, s/p [employer] CRT-D, nonischemic cardiomyopathy, stage III kidney disease, and cocaine abuse; who presents with complaints of acute onset of chest pain yesterday evening after recently smoking cocaine.  Patient reports the pain was substernal and felt like someone standing on his chest.  Associated symptoms included diaphoresis, nausea, shortness of breath, and lightheadedness.  Patient reports that however the last 2 months due to stressors he has been smoking cocaine almost every other day.  Last cardiac cath in 2011 revealed nonischemic disease.  He is followed in outpatient by Dr. Aundra Dubin of cardiology.  In route with EMS patient was given full dose aspirin and nitroglycerin without relief of pain.  ED Course: Upon admission into the emergency department patient was noted to be afebrile, pulse 93-107, respirations 18-24, blood pressures elevated up to 140/95, and O2 saturation maintained on room air.  Labs revealed WBC 11.7, BUN 10, creatinine 1.66, and high-sensitivity troponin 28->27.  COVID-19 screening negative.  Chest x-ray showed no acute abnormalities.  Urine drug screen was positive for cocaine.  Pacemaker was interrogated revealing runs of atrial tachycardia but no VT/VF/A. fib.  He was given morphine without relief of pain symptoms, and thereafter started on a heparin drip.  He still complains of having chest discomfort at this time.  Review of Systems  Constitutional: Positive for diaphoresis.  HENT:  Negative for congestion and nosebleeds.   Eyes: Negative for photophobia and pain.  Respiratory: Positive for shortness of breath. Negative for cough.   Cardiovascular: Positive for chest pain. Negative for leg swelling.  Gastrointestinal: Negative for abdominal pain, diarrhea and vomiting.  Genitourinary: Negative for dysuria and hematuria.  Musculoskeletal: Negative for falls.  Neurological: Positive for dizziness. Negative for focal weakness and loss of consciousness.  Psychiatric/Behavioral: Positive for substance abuse. Negative for memory loss.    Past Medical History:  Diagnosis Date   AICD (automatic cardioverter/defibrillator) present    a.  Unsuccessful LV lead placement 11/2014   CAD (coronary artery disease)    a. nonobstructive by [facility] 6/11: oOM1 40%, mild luminal irregs elsewhere b. cath 09/2014L no obstructive disease c. 03/2016: Low-risk NST   CHF (congestive heart failure) (HCC)    CKD (chronic kidney disease)    GERD (gastroesophageal reflux disease)    High cholesterol    HTN (hypertension)    Hx of echocardiogram    echo 5/11: EF 45-50%, mild HK of Ap AS wall, ap Ant wall and true apex (LAD distribution), mild LAE   LBBB (left bundle branch block)    LBP (low back pain)    NICM (nonischemic cardiomyopathy) ([place])    Systolic heart failure ([place])     Past Surgical History:  Procedure Laterality Date   BI-VENTRICULAR IMPLANTABLE CARDIOVERTER DEFIBRILLATOR N/A 12/14/2014   with failed LV lead placeemnt   CARDIAC CATHETERIZATION Left 07/2013   with angiogram   DEBRIDEMENT AND CLOSURE WOUND Right 09/20/2015   Procedure: DEBRIDEMENT AND CLOSURE WOUND;  Surgeon: Dayna Barker, MD;  Location: [place];  Service: Plastics;  Laterality: Right;  EPICARDIAL PACING LEAD PLACEMENT N/A 02/07/2015   eicardial LV lead placed by Dr Laverta Baltimore   LEFT HEART CATHETERIZATION WITH CORONARY ANGIOGRAM N/A 08/04/2013   Procedure: LEFT HEART CATHETERIZATION WITH CORONARY  ANGIOGRAM;  Surgeon: Larey Dresser, MD;  Location: [facility] CATH LAB;  Service: Cardiovascular;  Laterality: N/A;   TENDON REPAIR Left 09/20/2015   Procedure: repair nerve tendon wrist;  Surgeon: Dayna Barker, MD;  Location: [place];  Service: Plastics;  Laterality: Left;   THORACOTOMY Left 02/07/2015   Procedure: THORACOTOMY MAJOR;  Surgeon: Gaye Pollack, MD;  Location: [facility] OR;  Service: Thoracic;  Laterality: Left;     reports that he has been smoking cigarettes. He has a 3.00 pack-year smoking history. He has never used smokeless tobacco. He reports current alcohol use. He reports that he does not use drugs.  No Known Allergies  Family History  Problem Relation Age of Onset   Heart attack Mother        in 34's   Heart disease Mother        ischemic   Congestive Heart Failure Mother    Diabetes Mother    Hypertension Mother    Heart attack Father        in 20's   Heart disease Father        ischemic   Heart Problems Father    Crohn's disease Sister    Prostate cancer Brother    HIV/AIDS Brother     Prior to Admission medications   Medication Sig Start Date End Date Taking? Authorizing Provider  carvedilol (COREG) 6.25 MG tablet TAKE 1 TABLET BY MOUTH TWICE A DAY Patient taking differently: Take 6.25 mg by mouth 2 (two) times daily.  03/22/19  Yes Larey Dresser, MD  ENTRESTO 97-103 MG TAKE 1 TABLET BY MOUTH TWICE A DAY Patient taking differently: Take 1 tablet by mouth 2 (two) times daily.  01/12/19  Yes Larey Dresser, MD  omeprazole (PRILOSEC) 20 MG capsule TAKE 1 CAPSULE (20 MG TOTAL) BY MOUTH DAILY AS NEEDED (FOR STOMACH). Patient taking differently: Take 20 mg by mouth daily.  02/22/19  Yes Bensimhon, Shaune Pascal, MD  traMADol (ULTRAM) 50 MG tablet Take 1 tablet (50 mg total) by mouth every 6 (six) hours as needed. Patient taking differently: Take 50 mg by mouth every 6 (six) hours as needed for moderate pain.  11/09/18  Yes Fransico Meadow, PA-C    Physical  Exam:  Constitutional: Obese male in no acute distress at this time Vitals:   07/04/19 0518 07/04/19 0736 07/04/19 0745 07/04/19 0800  BP: 122/83 136/87 (!) 132/91 (!) 140/95  Pulse: 93 78 80 83  Resp: (!) 21 20 18  (!) 21  Temp:      TempSrc:      SpO2: 96% 96% 96% 92%  Weight:      Height:       Eyes: PERRL, lids and conjunctivae normal ENMT: Mucous membranes are dry. Posterior pharynx clear of any exudate or lesions.  Neck: normal, supple, no masses, no thyromegaly Respiratory: clear to auscultation bilaterally, no wheezing, no crackles. Normal respiratory effort. No accessory muscle use.  Cardiovascular: Regular rate and rhythm, no murmurs / rubs / gallops. No extremity edema. 2+ pedal pulses. No carotid bruits.  Abdomen: no tenderness, no masses palpated. No hepatosplenomegaly. Bowel sounds positive.  Musculoskeletal: no clubbing / cyanosis. No joint deformity upper and lower extremities. Good ROM, no contractures. Normal muscle tone.  Skin: no rashes, lesions, ulcers. No  induration Neurologic: CN 2-12 grossly intact. Sensation intact, DTR normal. Strength 5/5 in all 4.  Psychiatric: Normal judgment and insight. Alert and oriented x 3. Normal mood.     Labs on Admission: I have personally reviewed following labs and imaging studies  CBC: Recent Labs  Lab 07/04/19 0415  WBC 11.7*  NEUTROABS 8.9*  HGB 15.1  HCT 46.3  MCV 82.8  PLT 092   Basic Metabolic Panel: Recent Labs  Lab 07/04/19 0415  NA 137  K 3.7  CL 103  CO2 19*  GLUCOSE 108*  BUN 10  CREATININE 1.66*  CALCIUM 8.9   GFR: Estimated Creatinine Clearance: 55.6 mL/min (A) (by C-G formula based on SCr of 1.66 mg/dL (H)). Liver Function Tests: No results for input(s): AST, ALT, ALKPHOS, BILITOT, PROT, ALBUMIN in the last 168 hours. No results for input(s): LIPASE, AMYLASE in the last 168 hours. No results for input(s): AMMONIA in the last 168 hours. Coagulation Profile: No results for input(s): INR,  PROTIME in the last 168 hours. Cardiac Enzymes: No results for input(s): CKTOTAL, CKMB, CKMBINDEX, TROPONINI in the last 168 hours. BNP (last 3 results) No results for input(s): PROBNP in the last 8760 hours. HbA1C: No results for input(s): HGBA1C in the last 72 hours. CBG: No results for input(s): GLUCAP in the last 168 hours. Lipid Profile: No results for input(s): CHOL, HDL, LDLCALC, TRIG, CHOLHDL, LDLDIRECT in the last 72 hours. Thyroid Function Tests: No results for input(s): TSH, T4TOTAL, FREET4, T3FREE, THYROIDAB in the last 72 hours. Anemia Panel: No results for input(s): VITAMINB12, FOLATE, FERRITIN, TIBC, IRON, RETICCTPCT in the last 72 hours. Urine analysis:    Component Value Date/Time   COLORURINE YELLOW 02/05/2015 1500   APPEARANCEUR CLEAR 02/05/2015 1500   LABSPEC 1.023 02/05/2015 1500   PHURINE 6.0 02/05/2015 1500   GLUCOSEU NEGATIVE 02/05/2015 1500   HGBUR NEGATIVE 02/05/2015 1500   BILIRUBINUR NEGATIVE 02/05/2015 1500   KETONESUR NEGATIVE 02/05/2015 1500   PROTEINUR NEGATIVE 02/05/2015 1500   UROBILINOGEN 0.2 02/05/2015 1500   NITRITE NEGATIVE 02/05/2015 1500   LEUKOCYTESUR NEGATIVE 02/05/2015 1500   Sepsis Labs: Recent Results (from the past 240 hour(s))  SARS Coronavirus 2 [facility] order, Performed in [facility] hospital lab) Nasopharyngeal Nasopharyngeal Swab     Status: None   Collection Time: 07/04/19  6:58 AM   Specimen: Nasopharyngeal Swab  Result Value Ref Range Status   SARS Coronavirus 2 NEGATIVE NEGATIVE Final    Comment: (NOTE) If result is NEGATIVE SARS-CoV-2 target nucleic acids are NOT DETECTED. The SARS-CoV-2 RNA is generally detectable in upper and lower  respiratory specimens during the acute phase of infection. The lowest  concentration of SARS-CoV-2 viral copies this assay can detect is 250  copies / mL. A negative result does not preclude SARS-CoV-2 infection  and should not be used as the sole basis for treatment or other    patient management decisions.  A negative result may occur with  improper specimen collection / handling, submission of specimen other  than nasopharyngeal swab, presence of viral mutation(s) within the  areas targeted by this assay, and inadequate number of viral copies  (<250 copies / mL). A negative result must be combined with clinical  observations, patient history, and epidemiological information. If result is POSITIVE SARS-CoV-2 target nucleic acids are DETECTED. The SARS-CoV-2 RNA is generally detectable in upper and lower  respiratory specimens dur ing the acute phase of infection.  Positive  results are indicative of active infection with SARS-CoV-2.  Clinical  correlation with patient history and other diagnostic information is  necessary to determine patient infection status.  Positive results do  not rule out bacterial infection or co-infection with other viruses. If result is PRESUMPTIVE POSTIVE SARS-CoV-2 nucleic acids MAY BE PRESENT.   A presumptive positive result was obtained on the submitted specimen  and confirmed on repeat testing.  While 2019 novel coronavirus  (SARS-CoV-2) nucleic acids may be present in the submitted sample  additional confirmatory testing may be necessary for epidemiological  and / or clinical management purposes  to differentiate between  SARS-CoV-2 and other Sarbecovirus currently known to infect humans.  If clinically indicated additional testing with an alternate test  methodology [phone]) is advised. The SARS-CoV-2 RNA is generally  detectable in upper and lower respiratory sp ecimens during the acute  phase of infection. The expected result is Negative. Fact Sheet for Patients:  StrictlyIdeas.no Fact Sheet for Healthcare Providers: BankingDealers.co.za This test is not yet approved or cleared by the [place] FDA and has been authorized for detection and/or diagnosis of SARS-CoV-2  by FDA under an Emergency Use Authorization (EUA).  This EUA will remain in effect (meaning this test can be used) for the duration of the COVID-19 declaration under Section 564(b)(1) of the Act, 21 U.S.C. section 360bbb-3(b)(1), unless the authorization is terminated or revoked sooner. Performed at [facility], [place] [address], [place], [place] [address]      Radiological Exams on Admission: Dg Chest Port 1 View  Result Date: 07/04/2019 CLINICAL DATA:  Chest pain EXAM: PORTABLE CHEST 1 VIEW COMPARISON:  Chest CT 03/11/2018 FINDINGS: Biventricular pacer including direct epicardial lead along the left heart border. Artifact from EKG leads. Normal heart size and mediastinal contours. No acute infiltrate or edema. No effusion or pneumothorax. No acute osseous findings. IMPRESSION: No evidence of active disease. Electronically Signed   By: Monte Fantasia M.D.   On: 07/04/2019 04:50    EKG: Independently reviewed.  Sinus tachycardia 120 bpm with PVC,LAFB, and QTc noted to be 514.  Assessment/Plan Chest pain, elevated troponin: Acute.  Patient presents with complaints of chest discomfort after using cocaine.  Chest pain was not improved with morphine and nitroglycerin.  EKG reveals sinus tachycardia with PVCs.  Previous history of cardiac catheterization in 2011 revealed nonobstructive disease although there was note of a 40% lesion of the obtuse marginal artery at that time.  Heart score reported to be 5.  Patient still reports having chest pain despite receiving aspirin and nitroglycerin in route with EMS. -Admit to a telemetry bed -N.p.o. for possible need of procedure -Continue Heparin per pharmacy -Check echocardiogram -Check lipid panel -Appreciate cardiology consultative services, we will follow-up for further recommendation  Leukocytosis: Acute.  WBC elevated 11.7.  Chest x-ray otherwise clear of any signs of infection.  COVID-19 screening negative.  This likely could be reactive  in nature given recent cocaine use. -Check urinalysis -Recheck CBC in a.m.  Cocaine use: Patient reports last using cocaine every other day over the last 2 months and last used yesterday prior to the onset of symptoms. -Counseled on need of cessation of cocaine use   Chronic systolic congestive heart failure, status post ICD: Patient's last EF noted to be around 50 to 55% with grade 1 diastolic dysfunction by echocardiogram from 03/11/2018.  Patient appears to be compensated at this time. -Follow-up repeat echocardiogram  Essential hypertension: Home medications include Entresto and Coreg. -Held Coreg due to recent cocaine use -Hold Entresto due  to kidney function and possibility of rhabdomyolysis  Chronic kidney disease stage III: Creatinine previously noted to be around 1.4 in 2019.  Patient presents with creatinine elevated up to 1.66.  Question possibility of acute kidney injury. -Follow-up urinalysis -Check CK given recent cocaine use -Normal saline IV fluids at 75 mL/h x 1 L -Recheck kidney function in a.m.  Tobacco abuse: Patient reports smoking half pack cigarettes per day on average -Counseled on need of cessation of tobacco use  Hyperlipidemia: Last lipid panel from 03/11/2018 noted total cholesterol 175, HDL 35, and LDL 127.  Patient not on any cholesterol-lowering medications. -Follow-up lipid panel -Would likely benefit from being started on a cholesterol-lowering medication  Obesity: BMI 33.60 kg/m  DVT prophylaxis: Lovenox Code Status: Full Family Communication: Updated that patient's sister Disposition Plan: Possibly discharge home later today Consults called: Cardiology Admission status: Observation  Norval Morton MD [facility] Pager [phone]   If 7PM-7AM, please contact night-coverage [email or web address] Password [email or web address]  07/04/2019, 9:14 AM

## 2019-07-04 NOTE — Care Management (Signed)
This is a no charge note  Pending admission per Dr. Roxanne Mins  58 year old man with past medical history of hypertension, hyperlipidemia, CHF with EF of 50%, left bundle blockage, CKD, CAD, AICD, who presents with chest pain, which started 14:00 yesterday. He used cocaine last night, which made his chest pain worse.  Patient was treated with aspirin 324 mg and nitroglycerin without significant improvement.  Patient still has ongoing chest pain.  Troponin 28. IV heparin was started in the ED.  Patient is placed on stepdown bed for observation.  Ivor Costa, MD  [facility]   If 7PM-7AM, please contact night-coverage [email or web address] Password [facility] 07/04/2019, 6:25 AM

## 2019-07-04 NOTE — Consult Note (Addendum)
Advanced Heart Failure Team Consult Note   Primary Cardiologist:  Mclean  Reason for Consult: Chest pain   HPI:    We are asked to see Roger Crosby by Dr. Tamala Julian for further evaluation of CP.  Roger Crosby is a 58 y.o. with history of LBBB and nonischemic cardiomyopathy due to presumed substance abuse who has been followed by Dr. Aundra Dubin.  Echo in 2014 showed EF 20-25% with regional wall motion abnormalities. Left heart cath in 9/14 that showed no obstructive disease.   [employer] CRT-D device was placed.   He was lost to followup for about 2 years (was in prison).  He presented to [facility] ER in 4/19 with chest pain.  UDS was positive for cocaine.  He says that he "messed up" and was grieving for a death in the family.  No cocaine since, and he had no used any cocaine prior to that since leaving prison.  Echo in 4/19 showed EF 50-55% with mild MR.  Cardiolite in 4/19 showed EF 40% with no perfusion defect (low risk).    Last seen in HF Clinic in 8/19 and was stable. 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 Has been under a lt of family stress lately. Last night, had acute onset of left sided CP with SOB after a stressful event. Decided to smoke some crack cocaine and pain got worse. Came to ER and no response to NTG x 3. Got morphine and says he now can relax but pain still 7/10.  ECG shows LBBB (no LV pacing). HsTrop negative x 2 (27, 28) despite reported 7/10 pain. Echo done and reviewed personally EF 35-40% with LBBB. (Formal read 40-45%)  Says he is using cocaine about 2x/week and smoking 1/2 ppd cigarettes    Review of Systems: [y] = yes, [ ]  = no   General: Weight gain [ ] ; Weight loss [ ] ; Anorexia [ ] ; Fatigue [ ] ; Fever [ ] ; Chills [ ] ; Weakness [ ]   Cardiac: Chest pain/pressure [ y]; Resting SOB Blue.Reese ]; Exertional SOB [ ] ; Orthopnea [ ] ; Pedal Edema [ ] ; Palpitations [ ] ; Syncope [ ] ; Presyncope [ ] ;  Paroxysmal nocturnal dyspnea[ ]   Pulmonary: Cough [ ] ; Wheezing[ ] ; Hemoptysis[ ] ; Sputum [ ] ; Snoring [ ]   GI: Vomiting[ ] ; Dysphagia[ ] ; Melena[ ] ; Hematochezia [ ] ; Heartburn[ ] ; Abdominal pain [ ] ; Constipation [ ] ; Diarrhea [ ] ; BRBPR [ ]   GU: Hematuria[ ] ; Dysuria [ ] ; Nocturia[ ]   Vascular: Pain in legs with walking [ ] ; Pain in feet with lying flat [ ] ; Non-healing sores [ ] ; Stroke [ ] ; TIA [ ] ; Slurred speech [ ] ;  Neuro: Headaches[ ] ; Vertigo[ ] ; Seizures[ ] ; Paresthesias[ ] ;Blurred vision [ ] ; Diplopia [ ] ; Vision changes [ ]   Ortho/Skin: Arthritis Blue.Reese ]; Joint pain Blue.Reese ]; Muscle pain [ ] ; Joint swelling [ ] ; Back Pain Blue.Reese ]; Rash [ ]   Psych: Depression[y ]; Anxiety[y  Heme: Bleeding problems [ ] ; Clotting disorders [ ] ; Anemia [ ]   Endocrine: Diabetes [ ] ; Thyroid dysfunction[ ]   Home Medications Prior to Admission medications   Medication Sig Start Date End Date Taking? Authorizing Provider  carvedilol (COREG) 6.25 MG tablet TAKE 1 TABLET BY MOUTH TWICE A DAY Patient taking differently: Take 6.25 mg by mouth 2 (two) times daily.  03/22/19  Yes Larey Dresser, MD  ENTRESTO 97-103 MG TAKE  1 TABLET BY MOUTH TWICE A DAY Patient taking differently: Take 1 tablet by mouth 2 (two) times daily.  01/12/19  Yes Larey Dresser, MD  omeprazole (PRILOSEC) 20 MG capsule TAKE 1 CAPSULE (20 MG TOTAL) BY MOUTH DAILY AS NEEDED (FOR STOMACH). Patient taking differently: Take 20 mg by mouth daily.  02/22/19  Yes Leonardo Plaia, Shaune Pascal, MD  traMADol (ULTRAM) 50 MG tablet Take 1 tablet (50 mg total) by mouth every 6 (six) hours as needed. Patient taking differently: Take 50 mg by mouth every 6 (six) hours as needed for moderate pain.  11/09/18  Yes Sidney Ace    Past Medical History: Past Medical History:  Diagnosis Date  . AICD (automatic cardioverter/defibrillator) present    a.  Unsuccessful LV lead placement 11/2014  . CAD (coronary artery disease)    a. nonobstructive by LHC 6/11:  oOM1 40%, mild luminal irregs elsewhere b. cath 09/2014L no obstructive disease c. 03/2016: Low-risk NST  . CHF (congestive heart failure) ([place])   . CKD (chronic kidney disease)   . GERD (gastroesophageal reflux disease)   . High cholesterol   . HTN (hypertension)   . Hx of echocardiogram    echo 5/11: EF 45-50%, mild HK of Ap AS wall, ap Ant wall and true apex (LAD distribution), mild LAE  . LBBB (left bundle branch block)   . LBP (low back pain)   . NICM (nonischemic cardiomyopathy) ([place])   . Systolic heart failure [facility])     Past Surgical History: Past Surgical History:  Procedure Laterality Date  . BI-VENTRICULAR IMPLANTABLE CARDIOVERTER DEFIBRILLATOR N/A 12/14/2014   with failed LV lead placeemnt  . CARDIAC CATHETERIZATION Left 07/2013   with angiogram  . DEBRIDEMENT AND CLOSURE WOUND Right 09/20/2015   Procedure: DEBRIDEMENT AND CLOSURE WOUND;  Surgeon: Dayna Barker, MD;  Location: [place];  Service: Plastics;  Laterality: Right;  . EPICARDIAL PACING LEAD PLACEMENT N/A 02/07/2015   eicardial LV lead placed by Dr Laverta Baltimore  . LEFT HEART CATHETERIZATION WITH CORONARY ANGIOGRAM N/A 08/04/2013   Procedure: LEFT HEART CATHETERIZATION WITH CORONARY ANGIOGRAM;  Surgeon: Larey Dresser, MD;  Location: [facility] CATH LAB;  Service: Cardiovascular;  Laterality: N/A;  . TENDON REPAIR Left 09/20/2015   Procedure: repair nerve tendon wrist;  Surgeon: Dayna Barker, MD;  Location: [place];  Service: Plastics;  Laterality: Left;  . THORACOTOMY Left 02/07/2015   Procedure: THORACOTOMY MAJOR;  Surgeon: Gaye Pollack, MD;  Location: [facility] OR;  Service: Thoracic;  Laterality: Left;    Family History: Family History  Problem Relation Age of Onset  . Heart attack Mother        in 92's  . Heart disease Mother        ischemic  . Congestive Heart Failure Mother   . Diabetes Mother   . Hypertension Mother   . Heart attack Father        in 52's  . Heart disease Father        ischemic  . Heart Problems Father    . Crohn's disease Sister   . Prostate cancer Brother   . HIV/AIDS Brother     Social History: Social History   Socioeconomic History  . Marital status: Married    Spouse name: Not on file  . Number of children: Not on file  . Years of education: Not on file  . Highest education level: Not on file  Occupational History  . Not on file  Social Needs  .  Financial resource strain: Not on file  . Food insecurity    Worry: Not on file    Inability: Not on file  . Transportation needs    Medical: Not on file    Non-medical: Not on file  Tobacco Use  . Smoking status: Current Some Day Smoker    Packs/day: 0.12    Years: 25.00    Pack years: 3.00    Types: Cigarettes  . Smokeless tobacco: Never Used  Substance and Sexual Activity  . Alcohol use: Yes    Comment: social  . Drug use: No    Types: "Crack" cocaine    Comment: 12/14/2014 "last crack was in 2014"  . Sexual activity: Yes  Lifestyle  . Physical activity    Days per week: Not on file    Minutes per session: Not on file  . Stress: Not on file  Relationships  . Social Herbalist on phone: Not on file    Gets together: Not on file    Attends religious service: Not on file    Active member of club or organization: Not on file    Attends meetings of clubs or organizations: Not on file    Relationship status: Not on file  Other Topics Concern  . Not on file  Social History Narrative  . Not on file    Allergies:  No Known Allergies  Objective:    Vital Signs:   Temp:  [98.3 F (36.8 C)-98.8 F (37.1 C)] 98.3 F (36.8 C) (08/04 1016) Pulse Rate:  [71-107] 71 (08/04 1016) Resp:  [17-24] 18 (08/04 1016) BP: (122-152)/(69-95) 152/86 (08/04 1016) SpO2:  [92 %-100 %] 100 % (08/04 1016) Weight:  [99.8 kg-100.2 kg] 100.2 kg (08/04 1016) Last BM Date: 07/03/19(per pt:RN did not witnes) Filed Weights   07/04/19 0418 07/04/19 1016  Weight: 99.8 kg 100.2 kg    Physical Exam: General:  Well appearing.  No resp difficulty HEENT: normal Neck: supple. JVP . Carotids 2+ bilat; no bruits. No lymphadenopathy or thryomegaly appreciated. Cor: PMI nondisplaced. Regular rate & rhythm. No rubs, gallops or murmurs. Lungs: clear Abdomen: soft, nontender, nondistended. No hepatosplenomegaly. No bruits or masses. Good bowel sounds. Extremities: no cyanosis, clubbing, rash, edema Neuro: alert & orientedx3, cranial nerves grossly intact. moves all 4 extremities w/o difficulty. Affect pleasant  Telemetry:  SR with LBBB 70s Personally reviewed   Labs: Basic Metabolic Panel: Recent Labs  Lab 07/04/19 0415  NA 137  K 3.7  CL 103  CO2 19*  GLUCOSE 108*  BUN 10  CREATININE 1.66*  CALCIUM 8.9    Liver Function Tests: No results for input(s): AST, ALT, ALKPHOS, BILITOT, PROT, ALBUMIN in the last 168 hours. No results for input(s): LIPASE, AMYLASE in the last 168 hours. No results for input(s): AMMONIA in the last 168 hours.  CBC: Recent Labs  Lab 07/04/19 0415  WBC 11.7*  NEUTROABS 8.9*  HGB 15.1  HCT 46.3  MCV 82.8  PLT 305    Cardiac Enzymes: No results for input(s): CKTOTAL, CKMB, CKMBINDEX, TROPONINI in the last 168 hours.  BNP: BNP (last 3 results) No results for input(s): BNP in the last 8760 hours.  ProBNP (last 3 results) No results for input(s): PROBNP in the last 8760 hours.   CBG: No results for input(s): GLUCAP in the last 168 hours.  Coagulation Studies: No results for input(s): LABPROT, INR in the last 72 hours.  Other results: EKG: Stach 120 iLBBB (1104ms) No ischemic  changes Personally reviewed .  Imaging: Dg Chest Port 1 View  Result Date: 07/04/2019 CLINICAL DATA:  Chest pain EXAM: PORTABLE CHEST 1 VIEW COMPARISON:  Chest CT 03/11/2018 FINDINGS: Biventricular pacer including direct epicardial lead along the left heart border. Artifact from EKG leads. Normal heart size and mediastinal contours. No acute infiltrate or edema. No effusion or pneumothorax. No  acute osseous findings. IMPRESSION: No evidence of active disease. Electronically Signed   By: Monte Fantasia M.D.   On: 07/04/2019 04:50         Assessment:   1. Chest pain - Cath in 2014 with no CAD - CP reportedly preceded cocaine use but cocaine seems to have made it worse. Continues to c/o 7/10 CP but ECG and troponin are ok (ECG with reduced sensitivity in setting of iLBBB) - Echo shows mild drop in EF from previous but likely in setting of loss of CRT - I suspect this is not primarily ischemic event but can't be 100% sure at this point - Will recheck troponin and repeat morphine. If troponin remains normal and CP dissipates can continue medical management - If troponin elevated or CP uncontrolled may need to reconsider cath in light of RFs  2. Chronic systolic HF - Due to NICM. Cath 9/14 No CAD - EF improved with CRT. Last EF in 2019 Was 50% by echo and 40% by Myoview 4/19 (without ischemia) - Echo today 35-40% by my read but appears to have lost CRT. HAve asked STJ to interrogate - Volume status ok.  - Continue Entresto. Given alpha and beta bocking capacity of carvedilol likely safe to resume tomorrow - K low. Add spiro  3. Cocaine and tobacco use - encouraged cessation  4. Hypokalemia - will supp. Add spiro  5. HTN - mildly elevated in setting of cocaine. Continue Entresto. Add spiro   6. CKD 3 - stable   Length of Stay: 0   Glori Bickers MD 07/04/2019, 11:38 AM  Advanced Heart Failure Team Pager [phone] (M-F; [place])  Please contact [facility] for night-coverage after hours (4p -7a ) and weekends on [email or web address]

## 2019-07-04 NOTE — ED Triage Notes (Signed)
Pt. Came in EMS with c/o of substernal chest pain, no radiating, 9/10 pain, that increases with movement. Chest pain started around 1400 yesterday. Pt. Smoked cocaine around  2200 last night. 3 Nitro and 324mg  asa given in route and no relief of pain.Temporal Temp 97.6.

## 2019-07-05 ENCOUNTER — Encounter (HOSPITAL_COMMUNITY): Payer: Self-pay | Admitting: Cardiology

## 2019-07-05 ENCOUNTER — Encounter (HOSPITAL_COMMUNITY)
Admission: EM | Disposition: A | Discharge status: Home / Self Care | Payer: Self-pay | Source: Home / Self Care | Attending: Internal Medicine

## 2019-07-05 DIAGNOSIS — Z830 Family history of human immunodeficiency virus [HIV] disease: Secondary | ICD-10-CM | POA: Diagnosis not present

## 2019-07-05 DIAGNOSIS — E669 Obesity, unspecified: Secondary | ICD-10-CM | POA: Diagnosis present

## 2019-07-05 DIAGNOSIS — T405X1A Poisoning by cocaine, accidental (unintentional), initial encounter: Secondary | ICD-10-CM | POA: Diagnosis present

## 2019-07-05 DIAGNOSIS — R079 Chest pain, unspecified: Secondary | ICD-10-CM | POA: Diagnosis not present

## 2019-07-05 DIAGNOSIS — F1410 Cocaine abuse, uncomplicated: Secondary | ICD-10-CM | POA: Diagnosis present

## 2019-07-05 DIAGNOSIS — I428 Other cardiomyopathies: Secondary | ICD-10-CM | POA: Diagnosis present

## 2019-07-05 DIAGNOSIS — Z8042 Family history of malignant neoplasm of prostate: Secondary | ICD-10-CM | POA: Diagnosis not present

## 2019-07-05 DIAGNOSIS — Z6833 Body mass index (BMI) 33.0-33.9, adult: Secondary | ICD-10-CM | POA: Diagnosis not present

## 2019-07-05 DIAGNOSIS — Z8249 Family history of ischemic heart disease and other diseases of the circulatory system: Secondary | ICD-10-CM | POA: Diagnosis not present

## 2019-07-05 DIAGNOSIS — Z833 Family history of diabetes mellitus: Secondary | ICD-10-CM | POA: Diagnosis not present

## 2019-07-05 DIAGNOSIS — I5022 Chronic systolic (congestive) heart failure: Secondary | ICD-10-CM | POA: Diagnosis present

## 2019-07-05 DIAGNOSIS — E785 Hyperlipidemia, unspecified: Secondary | ICD-10-CM | POA: Diagnosis present

## 2019-07-05 DIAGNOSIS — K219 Gastro-esophageal reflux disease without esophagitis: Secondary | ICD-10-CM | POA: Diagnosis present

## 2019-07-05 DIAGNOSIS — I130 Hypertensive heart and chronic kidney disease with heart failure and stage 1 through stage 4 chronic kidney disease, or unspecified chronic kidney disease: Secondary | ICD-10-CM | POA: Diagnosis present

## 2019-07-05 DIAGNOSIS — N183 Chronic kidney disease, stage 3 (moderate): Secondary | ICD-10-CM | POA: Diagnosis present

## 2019-07-05 DIAGNOSIS — Z20828 Contact with and (suspected) exposure to other viral communicable diseases: Secondary | ICD-10-CM | POA: Diagnosis present

## 2019-07-05 DIAGNOSIS — F17210 Nicotine dependence, cigarettes, uncomplicated: Secondary | ICD-10-CM | POA: Diagnosis present

## 2019-07-05 DIAGNOSIS — E876 Hypokalemia: Secondary | ICD-10-CM | POA: Diagnosis present

## 2019-07-05 DIAGNOSIS — I200 Unstable angina: Secondary | ICD-10-CM | POA: Diagnosis not present

## 2019-07-05 DIAGNOSIS — E7800 Pure hypercholesterolemia, unspecified: Secondary | ICD-10-CM | POA: Diagnosis present

## 2019-07-05 DIAGNOSIS — I10 Essential (primary) hypertension: Secondary | ICD-10-CM | POA: Diagnosis not present

## 2019-07-05 DIAGNOSIS — Z95810 Presence of automatic (implantable) cardiac defibrillator: Secondary | ICD-10-CM | POA: Diagnosis not present

## 2019-07-05 DIAGNOSIS — I447 Left bundle-branch block, unspecified: Secondary | ICD-10-CM | POA: Diagnosis present

## 2019-07-05 HISTORY — PX: LEFT HEART CATH AND CORONARY ANGIOGRAPHY: CATH118249

## 2019-07-05 LAB — BASIC METABOLIC PANEL
Anion gap: 10 (ref 5–15)
BUN: 11 mg/dL (ref 6–20)
CO2: 22 mmol/L (ref 22–32)
Calcium: 8.5 mg/dL — ABNORMAL LOW (ref 8.9–10.3)
Chloride: 107 mmol/L (ref 98–111)
Creatinine, Ser: 1.44 mg/dL — ABNORMAL HIGH (ref 0.61–1.24)
GFR calc Af Amer: >60 mL/min (ref >60)
GFR calc non Af Amer: 53 mL/min — ABNORMAL LOW (ref >60)
Glucose, Bld: 83 mg/dL (ref 70–99)
Potassium: 4.1 mmol/L (ref 3.5–5.1)
Sodium: 139 mmol/L (ref 135–145)

## 2019-07-05 LAB — HEPARIN LEVEL (UNFRACTIONATED): Heparin Unfractionated: 0.54 IU/mL (ref 0.30–0.70)

## 2019-07-05 LAB — CBC
HCT: 47.5 % (ref 39.0–52.0)
Hemoglobin: 14.9 g/dL (ref 13.0–17.0)
MCH: 26.3 pg (ref 26.0–34.0)
MCHC: 31.4 g/dL (ref 30.0–36.0)
MCV: 83.8 fL (ref 80.0–100.0)
Platelets: 289 10*3/uL (ref 150–400)
RBC: 5.67 MIL/uL (ref 4.22–5.81)
RDW: 14.7 % (ref 11.5–15.5)
WBC: 6.8 10*3/uL (ref 4.0–10.5)
nRBC: 0 % (ref 0.0–0.2)

## 2019-07-05 LAB — TROPONIN I (HIGH SENSITIVITY): Troponin I (High Sensitivity): 23 ng/L — ABNORMAL HIGH (ref <18)

## 2019-07-05 LAB — CREATININE, SERUM
Creatinine, Ser: 1.37 mg/dL — ABNORMAL HIGH (ref 0.61–1.24)
GFR calc Af Amer: >60 mL/min (ref >60)
GFR calc non Af Amer: 56 mL/min — ABNORMAL LOW (ref >60)

## 2019-07-05 LAB — HIV ANTIBODY (ROUTINE TESTING W REFLEX): HIV Screen 4th Generation wRfx: NONREACTIVE

## 2019-07-05 LAB — MRSA PCR SCREENING: MRSA by PCR: NEGATIVE

## 2019-07-05 SURGERY — LEFT HEART CATH AND CORONARY ANGIOGRAPHY
Anesthesia: LOCAL

## 2019-07-05 MED ORDER — ASPIRIN 81 MG PO CHEW
81.0000 mg | CHEWABLE_TABLET | ORAL | Status: AC
  Administered 2019-07-05: 09:00:00 81 mg via ORAL

## 2019-07-05 MED ORDER — SODIUM CHLORIDE 0.9% FLUSH: 3.0000 mL | INTRAVENOUS | Status: DC | PRN

## 2019-07-05 MED ORDER — SODIUM CHLORIDE 0.9% FLUSH
3.0000 mL | Freq: Two times a day (BID) | INTRAVENOUS | Status: DC
  Administered 2019-07-05: 21:00:00 3 mL via INTRAVENOUS

## 2019-07-05 MED ORDER — LIDOCAINE HCL (PF) 1 % IJ SOLN
INTRAMUSCULAR | Status: DC | PRN
  Administered 2019-07-05: 2 mL

## 2019-07-05 MED ORDER — FENTANYL CITRATE (PF) 100 MCG/2ML IJ SOLN
INTRAMUSCULAR | Status: DC | PRN
  Administered 2019-07-05: 25 ug via INTRAVENOUS

## 2019-07-05 MED ORDER — LIDOCAINE HCL (PF) 1 % IJ SOLN
INTRAMUSCULAR | Status: AC
  Filled 2019-07-05: qty 30

## 2019-07-05 MED ORDER — CARVEDILOL 6.25 MG PO TABS: 6.2500 mg | ORAL_TABLET | Freq: Two times a day (BID) | ORAL | Status: DC

## 2019-07-05 MED ORDER — LABETALOL HCL 5 MG/ML IV SOLN: 10.0000 mg | INTRAVENOUS | Status: AC | PRN

## 2019-07-05 MED ORDER — ACETAMINOPHEN 325 MG PO TABS: 650.0000 mg | ORAL_TABLET | ORAL | Status: DC | PRN

## 2019-07-05 MED ORDER — VERAPAMIL HCL 2.5 MG/ML IV SOLN
INTRAVENOUS | Status: DC | PRN
  Administered 2019-07-05: 10 mL via INTRA_ARTERIAL

## 2019-07-05 MED ORDER — HEPARIN SODIUM (PORCINE) 1000 UNIT/ML IJ SOLN
INTRAMUSCULAR | Status: AC
  Filled 2019-07-05: qty 1

## 2019-07-05 MED ORDER — HYDRALAZINE HCL 20 MG/ML IJ SOLN: 10.0000 mg | INTRAMUSCULAR | Status: AC | PRN

## 2019-07-05 MED ORDER — SODIUM CHLORIDE 0.9 % IV SOLN: 250.0000 mL | INTRAVENOUS | Status: DC | PRN

## 2019-07-05 MED ORDER — MIDAZOLAM HCL 2 MG/2ML IJ SOLN
INTRAMUSCULAR | Status: AC
  Filled 2019-07-05: qty 2

## 2019-07-05 MED ORDER — SODIUM CHLORIDE 0.9% FLUSH
3.0000 mL | Freq: Two times a day (BID) | INTRAVENOUS | Status: DC
  Administered 2019-07-05 – 2019-07-06 (×2): 3 mL via INTRAVENOUS

## 2019-07-05 MED ORDER — ONDANSETRON HCL 4 MG/2ML IJ SOLN: 4.0000 mg | Freq: Four times a day (QID) | INTRAMUSCULAR | Status: DC | PRN

## 2019-07-05 MED ORDER — CARVEDILOL 6.25 MG PO TABS
6.2500 mg | ORAL_TABLET | Freq: Two times a day (BID) | ORAL | Status: DC
  Administered 2019-07-05 – 2019-07-06 (×3): 6.25 mg via ORAL
  Filled 2019-07-05 (×3): qty 1

## 2019-07-05 MED ORDER — HEPARIN SODIUM (PORCINE) 5000 UNIT/ML IJ SOLN
5000.0000 [IU] | Freq: Three times a day (TID) | INTRAMUSCULAR | Status: DC
  Administered 2019-07-05: 5000 [IU] via SUBCUTANEOUS
  Filled 2019-07-05 (×3): qty 1

## 2019-07-05 MED ORDER — VERAPAMIL HCL 2.5 MG/ML IV SOLN
INTRAVENOUS | Status: AC
  Filled 2019-07-05: qty 2

## 2019-07-05 MED ORDER — SODIUM CHLORIDE 0.9 % IV SOLN
INTRAVENOUS | Status: AC
  Administered 2019-07-05: 13:00:00 via INTRAVENOUS

## 2019-07-05 MED ORDER — HEPARIN SODIUM (PORCINE) 1000 UNIT/ML IJ SOLN
INTRAMUSCULAR | Status: DC | PRN
  Administered 2019-07-05: 5000 [IU] via INTRAVENOUS

## 2019-07-05 MED ORDER — HEPARIN (PORCINE) IN NACL 1000-0.9 UT/500ML-% IV SOLN
INTRAVENOUS | Status: AC
  Filled 2019-07-05: qty 1500

## 2019-07-05 MED ORDER — HEPARIN (PORCINE) IN NACL 1000-0.9 UT/500ML-% IV SOLN
INTRAVENOUS | Status: DC | PRN
  Administered 2019-07-05 (×3): 500 mL

## 2019-07-05 MED ORDER — SODIUM CHLORIDE 0.9 % IV SOLN
INTRAVENOUS | Status: DC
  Administered 2019-07-05: 09:00:00 via INTRAVENOUS

## 2019-07-05 MED ORDER — MIDAZOLAM HCL 2 MG/2ML IJ SOLN
INTRAMUSCULAR | Status: DC | PRN
  Administered 2019-07-05: 1 mg via INTRAVENOUS

## 2019-07-05 MED ORDER — IOHEXOL 350 MG/ML SOLN
INTRAVENOUS | Status: DC | PRN
  Administered 2019-07-05: 80 mL via INTRA_ARTERIAL

## 2019-07-05 MED ORDER — FENTANYL CITRATE (PF) 100 MCG/2ML IJ SOLN
INTRAMUSCULAR | Status: AC
  Filled 2019-07-05: qty 2

## 2019-07-05 SURGICAL SUPPLY — 9 items
CATH 5FR JL3.5 JR4 ANG PIG MP (CATHETERS) ×2 IMPLANT
DEVICE RAD COMP TR BAND LRG (VASCULAR PRODUCTS) ×2 IMPLANT
GLIDESHEATH SLEND SS 6F .021 (SHEATH) ×2 IMPLANT
GUIDEWIRE INQWIRE 1.5J.035X260 (WIRE) ×1 IMPLANT
INQWIRE 1.5J .035X260CM (WIRE) ×2
KIT HEART LEFT (KITS) ×2 IMPLANT
PACK CARDIAC CATHETERIZATION (CUSTOM PROCEDURE TRAY) ×2 IMPLANT
TRANSDUCER W/STOPCOCK (MISCELLANEOUS) ×2 IMPLANT
TUBING CIL FLEX 10 FLL-RA (TUBING) ×2 IMPLANT

## 2019-07-05 NOTE — H&P (View-Only) (Signed)
Patient ID: Roger Crosby, male   DOB: Jan 26, 1961, 58 y.o.   MRN: [id number]     Advanced Heart Failure Rounding Note  PCP-Cardiologist: No primary care provider on file.   Subjective:    Feels "much better" today but still with 3/10 left-sided chest pain.  Says it was 10/10 yesterday.  Hs-TnI 28 => 27 => 34.  He is now BiV pacing appropriately.   Echo: EF 40-45%, septal-lateral dyssynchrony.    Objective:   Weight Range: 100.2 kg Body mass index is 33.6 kg/m.   Vital Signs:   Temp:  [98.2 F (36.8 C)-98.5 F (36.9 C)] 98.3 F (36.8 C) (08/05 0757) Pulse Rate:  [71-84] 72 (08/05 0757) Resp:  [15-20] 16 (08/05 0757) BP: (117-152)/(83-101) 117/101 (08/05 0757) SpO2:  [93 %-100 %] 98 % (08/05 0757) Weight:  [100.2 kg] 100.2 kg (08/04 1016) Last BM Date: 07/03/19  Weight change: Filed Weights   07/04/19 0418 07/04/19 1016  Weight: 99.8 kg 100.2 kg    Intake/Output:   Intake/Output Summary (Last 24 hours) at 07/05/2019 0805 Last data filed at 07/05/2019 0502 Gross per 24 hour  Intake 509.93 ml  Output 1300 ml  Net -790.07 ml      Physical Exam    General:  Well appearing. No resp difficulty HEENT: Normal Neck: Supple. JVP . Carotids 2+ bilat; no bruits. No lymphadenopathy or thyromegaly appreciated. Cor: PMI nondisplaced. Regular rate & rhythm. No rubs, gallops or murmurs. Lungs: Clear Abdomen: Soft, nontender, nondistended. No hepatosplenomegaly. No bruits or masses. Good bowel sounds. Extremities: No cyanosis, clubbing, rash, edema Neuro: Alert & orientedx3, cranial nerves grossly intact. moves all 4 extremities w/o difficulty. Affect pleasant   Telemetry   NSR with BiV pacing in the 80s  Labs    CBC Recent Labs    07/04/19 0415  WBC 11.7*  NEUTROABS 8.9*  HGB 15.1  HCT 46.3  MCV 82.8  PLT 269   Basic Metabolic Panel Recent Labs    07/04/19 0415  NA 137  K 3.7  CL 103  CO2 19*  GLUCOSE 108*  BUN 10  CREATININE 1.66*  CALCIUM 8.9   Liver  Function Tests Recent Labs    07/04/19 1123  AST 16  ALT 16  ALKPHOS 70  BILITOT 0.7  PROT 6.3*  ALBUMIN 3.3*   No results for input(s): LIPASE, AMYLASE in the last 72 hours. Cardiac Enzymes Recent Labs    07/04/19 1123  CKTOTAL 197    BNP: BNP (last 3 results) No results for input(s): BNP in the last 8760 hours.  ProBNP (last 3 results) No results for input(s): PROBNP in the last 8760 hours.   D-Dimer No results for input(s): DDIMER in the last 72 hours. Hemoglobin A1C No results for input(s): HGBA1C in the last 72 hours. Fasting Lipid Panel Recent Labs    07/04/19 1137  CHOL 189  HDL 32*  LDLCALC 141*  TRIG 80  CHOLHDL 5.9   Thyroid Function Tests No results for input(s): TSH, T4TOTAL, T3FREE, THYROIDAB in the last 72 hours.  Invalid input(s): FREET3  Other results:   Imaging     No results found.   Medications:     Scheduled Medications: . aspirin EC  81 mg Oral Daily  .  morphine injection  4 mg Intravenous Once  . sacubitril-valsartan  1 tablet Oral BID  . spironolactone  12.5 mg Oral Daily     Infusions: . heparin 1,500 Units/hr (07/04/19 2311)     PRN Medications:  acetaminophen, alum & mag hydroxide-simeth **AND** lidocaine, hydrALAZINE, morphine injection   Assessment/Plan   1. Chest pain: Patient is still have chest pain but milder, now 3/10.  He reports 10/10 chest pain starting yesterday.  He had chest pain first, then used cocaine to see if that would help the chest pain (made it worse).  Hs TnI mildly elevated but no trend.  Patient continues to smoke.  Echo showed fall in EF from 50-55% to 40-45%.  - Given ongoing chest pain + RFs (cocaine, smoking, HTN) and fall in EF, think we need a definitive evaluation of his coronaries. I discussed coronary angiography with him today (risks/benefits), and he agrees to cath.  2. CKD: Stage 3.  Repeat BMET today.  3. Chronic systolic CHF: Prior nonischemic cardiomyopathy.  EF had  improved to 50-55% in 4/19 with BiV pacing.  Echo this admission with EF down to 40-45%, also appeared to have lost BiV pacing. 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  His pacemaker was adjusted, and he is BiV pacing appropriately this morning. He is not volume overloaded on exam.  - He will continue Entresto and have added spironolactone 12.5 mg daily.  - I think he can start back on Coreg 6.25 mg bid today given alpha and beta blocking characteristics (used cocaine prior to admit).  4. Cocaine: He understands need to quit, wants to enter rehab.  5. Smoking: Strongly encouraged cessation.   [name] 07/05/2019 8:13 AM

## 2019-07-05 NOTE — Progress Notes (Signed)
[place] for heparin drip Indication: ACS/STEMI  No Known Allergies  Patient Measurements: Height: 5\' 8"  (172.7 cm) Weight: 221 lb (100.2 kg) IBW/kg (Calculated) : 68.4 Heparin Dosing Weight: 89.8  Vital Signs: Temp: 98.3 F (36.8 C) (08/05 0757) Temp Source: Oral (08/05 0757) BP: 117/101 (08/05 0757) Pulse Rate: 72 (08/05 0757)  Labs: Recent Labs    07/04/19 0415 07/04/19 0655 07/04/19 1123 07/04/19 1137 07/04/19 2142 07/05/19 0657  HGB 15.1  --   --   --   --  14.9  HCT 46.3  --   --   --   --  47.5  PLT 305  --   --   --   --  289  HEPARINUNFRC  --   --  0.27*  --  0.23* 0.54  CREATININE 1.66*  --   --   --   --   --   CKTOTAL  --   --  197  --   --   --   TROPONINIHS 28* 27*  --  34*  --  23*    Estimated Creatinine Clearance: 55.6 mL/min (A) (by C-G formula based on SCr of 1.66 mg/dL (H)).   Medical History: Past Medical History:  Diagnosis Date  . AICD (automatic cardioverter/defibrillator) present    a.  Unsuccessful LV lead placement 11/2014  . CAD (coronary artery disease)    a. nonobstructive by LHC 6/11: oOM1 40%, mild luminal irregs elsewhere b. cath 09/2014L no obstructive disease c. 03/2016: Low-risk NST  . CHF (congestive heart failure) ([place])   . CKD (chronic kidney disease)   . GERD (gastroesophageal reflux disease)   . High cholesterol   . HTN (hypertension)   . Hx of echocardiogram    echo 5/11: EF 45-50%, mild HK of Ap AS wall, ap Ant wall and true apex (LAD distribution), mild LAE  . LBBB (left bundle branch block)   . LBP (low back pain)   . NICM (nonischemic cardiomyopathy) ([place])   . Systolic heart failure [facility])     Assessment: 58 yo male with chest pain which worsened after taking cocaine. No PTA anticoagulation noted.   Heparin level at goal today 0.54.  No overt bleeding or complications noted.  Planning cath today.  Goal of Therapy:  Heparin level 0.3-0.7 units/ml Monitor platelets by  anticoagulation protocol: Yes   Plan:  -Continue IV heparin at 1500 units/hr -F/u plans for IV heparin after cath lab today. -Daily heparin level and CBC.  Marguerite Olea, [facility] Clinical Pharmacist Phone [phone]  07/05/2019 9:17 AM

## 2019-07-05 NOTE — Progress Notes (Signed)
No significant disease on cath, noncardiac chest pain (started before he used cocaine).   From my standpoint, he can go home.  Send on Coreg 6.25 mg bid (should be ok with alpha blocker properties), Entresto 97/103 bid, spironolactone 12.5 daily.  He will need CHF clinic followup.   I think he would greatly benefit from substance abuse rehab, sounds like it is being arranged.   [name] 07/05/2019 1:17 PM

## 2019-07-05 NOTE — Progress Notes (Signed)
Pt is back from Cath Lab at 1.15pm  TR band has 12 cc air, vitals stable, denies pain, clear liquid diet tolerated and advanced to heart healthy. Will follow up with  social worker for rehab progress. Pt is resting comfortably in bed right now, will continue to monitor the patient  Palma Holter, RN

## 2019-07-05 NOTE — Interval H&P Note (Signed)
History and Physical Interval Note:  07/05/2019 12:33 PM  Roger Crosby  has presented today for surgery, with the diagnosis of chest pain.  The various methods of treatment have been discussed with the patient and family. After consideration of risks, benefits and other options for treatment, the patient has consented to  Procedure(s): LEFT HEART CATH AND CORONARY ANGIOGRAPHY (N/A) as a surgical intervention.  The patient's history has been reviewed, patient examined, no change in status, stable for surgery.  I have reviewed the patient's chart and labs.  Questions were answered to the patient's satisfaction.     Dalton [employer]

## 2019-07-05 NOTE — Progress Notes (Signed)
According to the social worker, pt is not able to be admitted at rehab facility called [facility] until this Friday. Pt is requesting to keep him at least tonight since he has no place to go, MD paged regarding the concerns.  Palma Holter, RN

## 2019-07-05 NOTE — Progress Notes (Signed)
PROGRESS NOTE    Roger Crosby  HQP:[id number] DOB: 26-Oct-1961 DOA: 07/04/2019 PCP: Patient, No Pcp Per    Brief Narrative:  Patient with history of hypertension, hyperlipidemia, chronic systolic congestive heart failure with last known ejection fraction 50% and grade 1 diastolic dysfunction in 5993, status post [facility] Jude CRT-D, nonischemic cardiomyopathy, stage III chronic kidney disease with baseline creatinine about 1.5, noncompliance, cocaine abuse who presented to the hospital with acute onset of left-sided chest pain, he tried cocaine thinking it might help, that made it worse so came to the ER.  In the emergency department he was afebrile, normotensive, blood pressures 140/95, on room air, BUN 10/1.66, slightly elevated high-sensitivity troponin with no uptrending.  COVID-19 negative.  Chest x-ray normal.  UDS positive for cocaine.  Pacemaker with atrial tachycardia.  Admit to the hospital with heparin infusion, with some clinical improvement.   Assessment & Plan:   Principal Problem:   Chest pain Active Problems:   Hyperlipidemia   Benign essential HTN   Chronic systolic CHF (congestive heart failure) (HCC)   S/P ICD (internal cardiac defibrillator) procedure   CKD (chronic kidney disease) stage 3, GFR 30-59 ml/min (HCC)   Elevated troponin   Cocaine use   Obesity (BMI 30.0-34.9)  Unstable angina/non-STEMI: Patient with high risk factors.  Continue monitoring in telemetry.  Continue heparin infusion.  Echocardiogram showed depressed ejection fraction than usual.  Patient on aspirin, carvedilol, Entresto.  Seen by cardiology.  Going for cardiac cath today.  LDL is 141, will start on a statin on discharge.  Acute kidney injury on chronic kidney disease stage III: Stabilizing.  On maintenance fluid for cardiac cath.  Chronic systolic congestive heart failure: With history of nonischemic cardiomyopathy.  EF had improved and now depressed to 40%.  He has AICD in place.  Euvolemic on  clinical exam.  He will continue Entresto, Aldactone was added.  On Coreg.  Cocaine abuse: Patient is very motivated.  He has asked for social worker to help fax his records to [facility] and he would like to go there after discharge.  Smoking: Smoking cessation counseling done.  He is motivated.   DVT prophylaxis: Heparin infusion Code Status: Full code Family Communication: None Disposition Plan: Home after hospitalization.  Anticipate tomorrow.   Consultants:   Cardiology  Procedures:   Cardiac cath, scheduled for today  Antimicrobials:   None   Subjective: Patient seen and examined.  Patient is stated that after second dose of morphine on admission his chest pain improved from 10 out of 10 to 3 out of 10.  He has some remaining vague midsternal pain.  Denies any shortness of breath.  No other complaints.  Objective: Vitals:   07/05/19 0501 07/05/19 0757 07/05/19 0900 07/05/19 1100  BP: 129/87 (!) 117/101    Pulse: 75 72 70   Resp: 19 16 14    Temp: 98.2 F (36.8 C) 98.3 F (36.8 C)  98.3 F (36.8 C)  TempSrc: Oral Oral  Oral  SpO2: 98% 98% 100%   Weight:   99.5 kg   Height:        Intake/Output Summary (Last 24 hours) at 07/05/2019 1105 Last data filed at 07/05/2019 0900 Gross per 24 hour  Intake 509.93 ml  Output 1450 ml  Net -940.07 ml   Filed Weights   07/04/19 0418 07/04/19 1016 07/05/19 0900  Weight: 99.8 kg 100.2 kg 99.5 kg    Examination:  General exam: Appears calm and comfortable, on room air.  Looks comfortable. Respiratory system: Clear to auscultation. Respiratory effort normal. Cardiovascular system: S1 & S2 heard, RRR. No JVD, murmurs, rubs, gallops or clicks. No pedal edema.  Left precordial AICD nontender in place. Gastrointestinal system: Abdomen is nondistended, soft and nontender. No organomegaly or masses felt. Normal bowel sounds heard. Central nervous system: Alert and oriented. No focal neurological deficits. Extremities:  Symmetric 5 x 5 power. Skin: No rashes, lesions or ulcers Psychiatry: Judgement and insight appear normal. Mood & affect appropriate.     Data Reviewed: I have personally reviewed following labs and imaging studies  CBC: Recent Labs  Lab 07/04/19 0415 07/05/19 0657  WBC 11.7* 6.8  NEUTROABS 8.9*  --   HGB 15.1 14.9  HCT 46.3 47.5  MCV 82.8 83.8  PLT 305 237   Basic Metabolic Panel: Recent Labs  Lab 07/04/19 0415 07/05/19 0657  NA 137 139  K 3.7 4.1  CL 103 107  CO2 19* 22  GLUCOSE 108* 83  BUN 10 11  CREATININE 1.66* 1.44*  CALCIUM 8.9 8.5*   GFR: Estimated Creatinine Clearance: 63.9 mL/min (A) (by C-G formula based on SCr of 1.44 mg/dL (H)). Liver Function Tests: Recent Labs  Lab 07/04/19 1123  AST 16  ALT 16  ALKPHOS 70  BILITOT 0.7  PROT 6.3*  ALBUMIN 3.3*   No results for input(s): LIPASE, AMYLASE in the last 168 hours. No results for input(s): AMMONIA in the last 168 hours. Coagulation Profile: No results for input(s): INR, PROTIME in the last 168 hours. Cardiac Enzymes: Recent Labs  Lab 07/04/19 1123  CKTOTAL 197   BNP (last 3 results) No results for input(s): PROBNP in the last 8760 hours. HbA1C: No results for input(s): HGBA1C in the last 72 hours. CBG: No results for input(s): GLUCAP in the last 168 hours. Lipid Profile: Recent Labs    07/04/19 1137  CHOL 189  HDL 32*  LDLCALC 141*  TRIG 80  CHOLHDL 5.9   Thyroid Function Tests: No results for input(s): TSH, T4TOTAL, FREET4, T3FREE, THYROIDAB in the last 72 hours. Anemia Panel: No results for input(s): VITAMINB12, FOLATE, FERRITIN, TIBC, IRON, RETICCTPCT in the last 72 hours. Sepsis Labs: No results for input(s): PROCALCITON, LATICACIDVEN in the last 168 hours.  Recent Results (from the past 240 hour(s))  SARS Coronavirus 2 [facility] order, Performed in [facility], [place] hospital lab) Nasopharyngeal Nasopharyngeal Swab     Status: None   Collection Time: 07/04/19  6:58 AM    Specimen: Nasopharyngeal Swab  Result Value Ref Range Status   SARS Coronavirus 2 NEGATIVE NEGATIVE Final    Comment: (NOTE) If result is NEGATIVE SARS-CoV-2 target nucleic acids are NOT DETECTED. The SARS-CoV-2 RNA is generally detectable in upper and lower  respiratory specimens during the acute phase of infection. The lowest  concentration of SARS-CoV-2 viral copies this assay can detect is 250  copies / mL. A negative result does not preclude SARS-CoV-2 infection  and should not be used as the sole basis for treatment or other  patient management decisions.  A negative result may occur with  improper specimen collection / handling, submission of specimen other  than nasopharyngeal swab, presence of viral mutation(s) within the  areas targeted by this assay, and inadequate number of viral copies  (<250 copies / mL). A negative result must be combined with clinical  observations, patient history, and epidemiological information. If result is POSITIVE SARS-CoV-2 target nucleic acids are DETECTED. The SARS-CoV-2 RNA is generally detectable in upper and lower  respiratory specimens dur ing the acute phase of infection.  Positive  results are indicative of active infection with SARS-CoV-2.  Clinical  correlation with patient history and other diagnostic information is  necessary to determine patient infection status.  Positive results do  not rule out bacterial infection or co-infection with other viruses. If result is PRESUMPTIVE POSTIVE SARS-CoV-2 nucleic acids MAY BE PRESENT.   A presumptive positive result was obtained on the submitted specimen  and confirmed on repeat testing.  While 2019 novel coronavirus  (SARS-CoV-2) nucleic acids may be present in the submitted sample  additional confirmatory testing may be necessary for epidemiological  and / or clinical management purposes  to differentiate between  SARS-CoV-2 and other Sarbecovirus currently known to infect humans.  If  clinically indicated additional testing with an alternate test  methodology [phone]) is advised. The SARS-CoV-2 RNA is generally  detectable in upper and lower respiratory sp ecimens during the acute  phase of infection. The expected result is Negative. Fact Sheet for Patients:  StrictlyIdeas.no Fact Sheet for Healthcare Providers: BankingDealers.co.za This test is not yet approved or cleared by the [place] FDA and has been authorized for detection and/or diagnosis of SARS-CoV-2 by FDA under an Emergency Use Authorization (EUA).  This EUA will remain in effect (meaning this test can be used) for the duration of the COVID-19 declaration under Section 564(b)(1) of the Act, 21 U.S.C. section 360bbb-3(b)(1), unless the authorization is terminated or revoked sooner. Performed at [facility], [place] [address] [place], [place] [address]   MRSA PCR Screening     Status: None   Collection Time: 07/04/19 10:50 PM   Specimen: Nasal Mucosa; Nasopharyngeal  Result Value Ref Range Status   MRSA by PCR NEGATIVE NEGATIVE Final    Comment:        The GeneXpert MRSA Assay (FDA approved for NASAL specimens only), is one component of a comprehensive MRSA colonization surveillance program. It is not intended to diagnose MRSA infection nor to guide or monitor treatment for MRSA infections. Performed at [facility], [place] [address], [place], [place] [address]          Radiology Studies: Dg Chest Port 1 View  Result Date: 07/04/2019 CLINICAL DATA:  Chest pain EXAM: PORTABLE CHEST 1 VIEW COMPARISON:  Chest CT 03/11/2018 FINDINGS: Biventricular pacer including direct epicardial lead along the left heart border. Artifact from EKG leads. Normal heart size and mediastinal contours. No acute infiltrate or edema. No effusion or pneumothorax. No acute osseous findings. IMPRESSION: No evidence of active disease. Electronically Signed   By:  Monte Fantasia M.D.   On: 07/04/2019 04:50        Scheduled Meds: . aspirin EC  81 mg Oral Daily  . carvedilol  6.25 mg Oral BID WC  .  morphine injection  4 mg Intravenous Once  . sacubitril-valsartan  1 tablet Oral BID  . sodium chloride flush  3 mL Intravenous Q12H  . spironolactone  12.5 mg Oral Daily   Continuous Infusions: . sodium chloride    . sodium chloride 75 mL/hr at 07/05/19 0918  . heparin 1,500 Units/hr (07/04/19 2311)     LOS: 0 days    Time spent: 25 minutes    Barb Merino, MD [facility] Pager [phone]  If 7PM-7AM, please contact night-coverage [email or web address] Password [email or web address] 07/05/2019, 11:05 AM

## 2019-07-05 NOTE — Progress Notes (Signed)
Consent taken for surgery. Pt is aware, Clipping done as instructed. Pt is in NPO since midnight. NS started @75cc /hr, standing weight recorded, will continue to monitor the patient  Palma Holter, RN

## 2019-07-05 NOTE — Plan of Care (Signed)

## 2019-07-05 NOTE — Progress Notes (Signed)
Patient ID: Roger Crosby, male   DOB: 01-27-1961, 58 y.o.   MRN: [id number]     Advanced Heart Failure Rounding Note  PCP-Cardiologist: No primary care provider on file.   Subjective:    Feels "much better" today but still with 3/10 left-sided chest pain.  Says it was 10/10 yesterday.  Hs-TnI 28 => 27 => 34.  He is now BiV pacing appropriately.   Echo: EF 40-45%, septal-lateral dyssynchrony.    Objective:   Weight Range: 100.2 kg Body mass index is 33.6 kg/m.   Vital Signs:   Temp:  [98.2 F (36.8 C)-98.5 F (36.9 C)] 98.3 F (36.8 C) (08/05 0757) Pulse Rate:  [71-84] 72 (08/05 0757) Resp:  [15-20] 16 (08/05 0757) BP: (117-152)/(83-101) 117/101 (08/05 0757) SpO2:  [93 %-100 %] 98 % (08/05 0757) Weight:  [100.2 kg] 100.2 kg (08/04 1016) Last BM Date: 07/03/19  Weight change: Filed Weights   07/04/19 0418 07/04/19 1016  Weight: 99.8 kg 100.2 kg    Intake/Output:   Intake/Output Summary (Last 24 hours) at 07/05/2019 0805 Last data filed at 07/05/2019 0502 Gross per 24 hour  Intake 509.93 ml  Output 1300 ml  Net -790.07 ml      Physical Exam    General:  Well appearing. No resp difficulty HEENT: Normal Neck: Supple. JVP . Carotids 2+ bilat; no bruits. No lymphadenopathy or thyromegaly appreciated. Cor: PMI nondisplaced. Regular rate & rhythm. No rubs, gallops or murmurs. Lungs: Clear Abdomen: Soft, nontender, nondistended. No hepatosplenomegaly. No bruits or masses. Good bowel sounds. Extremities: No cyanosis, clubbing, rash, edema Neuro: Alert & orientedx3, cranial nerves grossly intact. moves all 4 extremities w/o difficulty. Affect pleasant   Telemetry   NSR with BiV pacing in the 80s  Labs    CBC Recent Labs    07/04/19 0415  WBC 11.7*  NEUTROABS 8.9*  HGB 15.1  HCT 46.3  MCV 82.8  PLT 322   Basic Metabolic Panel Recent Labs    07/04/19 0415  NA 137  K 3.7  CL 103  CO2 19*  GLUCOSE 108*  BUN 10  CREATININE 1.66*  CALCIUM 8.9   Liver  Function Tests Recent Labs    07/04/19 1123  AST 16  ALT 16  ALKPHOS 70  BILITOT 0.7  PROT 6.3*  ALBUMIN 3.3*   No results for input(s): LIPASE, AMYLASE in the last 72 hours. Cardiac Enzymes Recent Labs    07/04/19 1123  CKTOTAL 197    BNP: BNP (last 3 results) No results for input(s): BNP in the last 8760 hours.  ProBNP (last 3 results) No results for input(s): PROBNP in the last 8760 hours.   D-Dimer No results for input(s): DDIMER in the last 72 hours. Hemoglobin A1C No results for input(s): HGBA1C in the last 72 hours. Fasting Lipid Panel Recent Labs    07/04/19 1137  CHOL 189  HDL 32*  LDLCALC 141*  TRIG 80  CHOLHDL 5.9   Thyroid Function Tests No results for input(s): TSH, T4TOTAL, T3FREE, THYROIDAB in the last 72 hours.  Invalid input(s): FREET3  Other results:   Imaging     No results found.   Medications:     Scheduled Medications: . aspirin EC  81 mg Oral Daily  .  morphine injection  4 mg Intravenous Once  . sacubitril-valsartan  1 tablet Oral BID  . spironolactone  12.5 mg Oral Daily     Infusions: . heparin 1,500 Units/hr (07/04/19 2311)     PRN Medications:  acetaminophen, alum & mag hydroxide-simeth **AND** lidocaine, hydrALAZINE, morphine injection   Assessment/Plan   1. Chest pain: Patient is still have chest pain but milder, now 3/10.  He reports 10/10 chest pain starting yesterday.  He had chest pain first, then used cocaine to see if that would help the chest pain (made it worse).  Hs TnI mildly elevated but no trend.  Patient continues to smoke.  Echo showed fall in EF from 50-55% to 40-45%.  - Given ongoing chest pain + RFs (cocaine, smoking, HTN) and fall in EF, think we need a definitive evaluation of his coronaries. I discussed coronary angiography with him today (risks/benefits), and he agrees to cath.  2. CKD: Stage 3.  Repeat BMET today.  3. Chronic systolic CHF: Prior nonischemic cardiomyopathy.  EF had  improved to 50-55% in 4/19 with BiV pacing.  Echo this admission with EF down to 40-45%, also appeared to have lost BiV pacing. 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  His pacemaker was adjusted, and he is BiV pacing appropriately this morning. He is not volume overloaded on exam.  - He will continue Entresto and have added spironolactone 12.5 mg daily.  - I think he can start back on Coreg 6.25 mg bid today given alpha and beta blocking characteristics (used cocaine prior to admit).  4. Cocaine: He understands need to quit, wants to enter rehab.  5. Smoking: Strongly encouraged cessation.   [name] 07/05/2019 8:13 AM

## 2019-07-05 NOTE — TOC Initial Note (Signed)
Transition of Care [facility]) - Initial/Assessment Note    Patient Details  Name: Roger Crosby MRN: [id number] Date of Birth: 09-May-1961  Transition of Care [facility]) CM/SW Contact:    Sable Feil, LCSW Phone Number: 07/05/2019, 3:21 PM  Clinical Narrative:   Patient requested to speak with CSW regarding his discharge. Roger Crosby reported that he will be going to [facility] for inpatient treatment at discharge and they are requesting information from the hospital at discharge. CSW was provided with contact - June - [phone].  Call made to June and she confirmed that patient is set to come to them on Friday. They will need his H&P, medication list and discharge summary - fax number [phone]. CSW informed June that meds he has taken prior to admission will be on H&P. Per June they need this statement in d/c summary: "Medically cleared for drug treatment program". June also indicated that patient has to bring in a 30-day supply of his medications and refill prescription or information stated on medication regarding refills. Patient is scheduled to admit on Friday. Roger Crosby reported that he has no where to stay and he last lived with his sister in [place], however they had a disagreement and he cannot return to her home. When asked, patient reported that other family lives out-of-town. Roger Crosby reported that he receives SSI and he cannot afford housing other then through public housing or low-income housing. Patient added that his name is on waiting list for either public or low-in-come housing, however the wait time for getting low-income housing is lengthy.  CSW given permission to provide [facility] with needed information at discharge.                   Patient Goals and CMS Choice        Expected Discharge Plan and Services  Patient will discharge to [facility] on Friday for inpatient drug treatment.         Expected Discharge Date:  07/05/19. Patient is not sure if he will discharge today.                                 Prior Living Arrangements/Services - Patient is currently homeless.                       Activities of Daily Living Home Assistive Devices/Equipment: None ADL Screening (condition at time of admission) Patient's cognitive ability adequate to safely complete daily activities?: Yes Is the patient deaf or have difficulty hearing?: No Does the patient have difficulty seeing, even when wearing glasses/contacts?: No Does the patient have difficulty concentrating, remembering, or making decisions?: No Patient able to express need for assistance with ADLs?: Yes Does the patient have difficulty dressing or bathing?: No Independently performs ADLs?: Yes (appropriate for developmental age) Does the patient have difficulty walking or climbing stairs?: No Weakness of Legs: None Weakness of Arms/Hands: None  Permission Sought/Granted  Roger Crosby gave CSW permission to contact June at [facility].                Emotional Assessment  Patient was alert, oriented, calm and proactive in asking for assistance in getting into drug treatment and informing CSW of what he needs at discharge for [facility].             Admission diagnosis:  Cocaine abuse ([place]) [F14.10]  Chest heaviness [R07.89] Renal insufficiency [N28.9] Patient Active Problem List   Diagnosis Date Noted  . Elevated troponin 07/04/2019  . Cocaine use 07/04/2019  . Obesity (BMI 30.0-34.9) 07/04/2019  . Chest pain 03/11/2018  . Substance abuse ([place]) 04/08/2016  . Atypical chest pain 04/07/2016  . CKD (chronic kidney disease) stage 3, GFR 30-59 ml/min (HCC) 04/07/2016  . S/P ICD (internal cardiac defibrillator) procedure 02/07/2015  . NICM (nonischemic cardiomyopathy) ([place]) 12/15/2014  . Chronic systolic CHF (congestive heart failure) ([place]) 08/23/2013  . DENTAL PAIN 04/04/2010  . DYSPNEA 04/04/2010  . LBBB  (left bundle branch block) 02/14/2010  . SKIN TAG 02/14/2010  . LEG PAIN 02/14/2010  . Hyperlipidemia 01/07/2010  . ERECTILE DYSFUNCTION 04/09/2009  . 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 Benign essential HTN 01/28/2009  . GERD 01/28/2009  . LOW BACK PAIN 01/28/2009   PCP:  Patient, No Pcp Per Pharmacy:   [id number] [address], [place] [address] Phone: [phone] Fax: [phone]     Social Determinants of Health ([place]) Interventions  Patient had arranged to go to drug treatment at [facility] and will admit on Friday, 07/07/19.  Readmission Risk Interventions No flowsheet data found.

## 2019-07-06 LAB — BASIC METABOLIC PANEL
Anion gap: 11 (ref 5–15)
BUN: 12 mg/dL (ref 6–20)
CO2: 23 mmol/L (ref 22–32)
Calcium: 8.5 mg/dL — ABNORMAL LOW (ref 8.9–10.3)
Chloride: 104 mmol/L (ref 98–111)
Creatinine, Ser: 1.52 mg/dL — ABNORMAL HIGH (ref 0.61–1.24)
GFR calc Af Amer: 58 mL/min — ABNORMAL LOW (ref >60)
GFR calc non Af Amer: 50 mL/min — ABNORMAL LOW (ref >60)
Glucose, Bld: 98 mg/dL (ref 70–99)
Potassium: 3.8 mmol/L (ref 3.5–5.1)
Sodium: 138 mmol/L (ref 135–145)

## 2019-07-06 LAB — CBC
HCT: 46.2 % (ref 39.0–52.0)
Hemoglobin: 14.6 g/dL (ref 13.0–17.0)
MCH: 26.5 pg (ref 26.0–34.0)
MCHC: 31.6 g/dL (ref 30.0–36.0)
MCV: 84 fL (ref 80.0–100.0)
Platelets: 300 10*3/uL (ref 150–400)
RBC: 5.5 MIL/uL (ref 4.22–5.81)
RDW: 14.6 % (ref 11.5–15.5)
WBC: 7 10*3/uL (ref 4.0–10.5)
nRBC: 0 % (ref 0.0–0.2)

## 2019-07-06 MED ORDER — SPIRONOLACTONE 25 MG PO TABS: 12.5000 mg | ORAL_TABLET | Freq: Every day | ORAL | 0 refills | Status: DC

## 2019-07-06 MED ORDER — ASPIRIN 81 MG PO TBEC: 81.0000 mg | DELAYED_RELEASE_TABLET | Freq: Every day | ORAL | 0 refills | Status: DC

## 2019-07-06 NOTE — Plan of Care (Signed)

## 2019-07-06 NOTE — Progress Notes (Signed)
Dr. Lonny Prude came to talk to patient and will talk to Social worker regarding discharge summary for rehab place ([facility]).Patient understood that he is going to be discharge to home today. Explained patient regarding discharge process. Patient told that his friends or sister will pick him up, but now no one answer the phone. Patient asked for ridding. Called Care manager for taxi boucher for patient.  [employer]

## 2019-07-06 NOTE — Discharge Instructions (Signed)
Roger Crosby,  You were in the hospital because of chest pain. This appears to be secondary to your cocaine use. I am glad you are working on ceasing use of cocaine.

## 2019-07-06 NOTE — Discharge Summary (Signed)
Physician Discharge Summary  Roger Crosby QQV:[id number] DOB: [birth year] DOA: 07/04/2019  PCP: Patient, No Pcp Per  Admit date: 07/04/2019 Discharge date: 07/06/2019  Admitted From: Home Disposition: Home  Recommendations for Outpatient Follow-up:  1. Follow up with PCP in 1 week 2. Please obtain BMP/CBC in one week 3. Please follow up on the following pending results: None  Home Health: None Equipment/Devices: None  Discharge Condition: Stable CODE STATUS: Full code Diet recommendation: Heart healthy   Brief/Interim Summary:  Admission HPI written by Fuller Plan, MD   HPI: Roger Crosby is a 58 y.o. male with medical history significant of tension, hyperlipidemia, chronic systolic congestive heart failure last EF 50 to 55% with grade 1 diastolic function 3329, s/p Saint Jude CRT-D, nonischemic cardiomyopathy, stage III kidney disease, and cocaine abuse; who presents with complaints of acute onset of chest pain yesterday evening after recently smoking cocaine.  Patient reports the pain was substernal and felt like someone standing on his chest.  Associated symptoms included diaphoresis, nausea, shortness of breath, and lightheadedness.  Patient reports that however the last 2 months due to stressors he has been smoking cocaine almost every other day.  Last cardiac cath in 2011 revealed nonischemic disease.  He is followed in outpatient by Dr. Aundra Dubin of cardiology.  In route with EMS patient was given full dose aspirin and nitroglycerin without relief of pain.  ED Course: Upon admission into the emergency department patient was noted to be afebrile, pulse 93-107, respirations 18-24, blood pressures elevated up to 140/95, and O2 saturation maintained on room air.  Labs revealed WBC 11.7, BUN 10, creatinine 1.66, and high-sensitivity troponin 28->27.  COVID-19 screening negative.  Chest x-ray showed no acute abnormalities.  Urine drug screen was positive for cocaine.  Pacemaker was  interrogated revealing runs of atrial tachycardia but no VT/VF/A. fib.  He was given morphine without relief of pain symptoms, and thereafter started on a heparin drip.  He still complains of having chest discomfort at this time.   Hospital course:  Chest pain Appears to be secondary to cocaine use. EKG significant for q waves without evidence of ACS with Hs troponin that trended low and flat, also not consistent with ACS. No significant CAD on left heart catheterization. Cardiology recommendations to continue Entresto and Coreg. Added spironolactone for CHF. No current physical restrictions. Low risk for CAD source of chest pain. No NSTEMI.  CKD stage III Does not meat criteria for AKI. Baseline creatinine is about 1.4. remains stable. Recheck BMP in 1 week.  Chronic systolic heart failure EF of 40-45%. Euvolemic. Spironolactone added to regimen. Outpatient cardiology follow-up.  Cocaine abuse Patient is motivated to quit and plans to attend program on discharge.  Smoking Cessation discussed. Patient motivated.  Discharge Diagnoses:  Principal Problem:   Chest pain Active Problems:   Hyperlipidemia   Benign essential HTN   Chronic systolic CHF (congestive heart failure) (HCC)   S/P ICD (internal cardiac defibrillator) procedure   CKD (chronic kidney disease) stage 3, GFR 30-59 ml/min (HCC)   Elevated troponin   Cocaine use   Obesity (BMI 30.0-34.9)    Discharge Instructions  Discharge Instructions    Diet - low sodium heart healthy   Complete by: As directed    Increase activity slowly   Complete by: As directed      Allergies as of 07/06/2019   No Known Allergies     Medication List    TAKE these medications   aspirin 81 MG  EC tablet Take 1 tablet (81 mg total) by mouth daily. Start taking on: July 07, 2019   carvedilol 6.25 MG tablet Commonly known as: COREG TAKE 1 TABLET BY MOUTH TWICE A DAY Notes to patient: 07/06/19 Evening   Entresto 97-103 MG Generic  drug: sacubitril-valsartan TAKE 1 TABLET BY MOUTH TWICE A DAY Notes to patient: 07/06/19 Evening   omeprazole 20 MG capsule Commonly known as: PRILOSEC TAKE 1 CAPSULE (20 MG TOTAL) BY MOUTH DAILY AS NEEDED (FOR STOMACH). What changed: when to take this   spironolactone 25 MG tablet Commonly known as: ALDACTONE Take 0.5 tablets (12.5 mg total) by mouth daily. Start taking on: July 07, 2019   traMADol 50 MG tablet Commonly known as: ULTRAM Take 1 tablet (50 mg total) by mouth every 6 (six) hours as needed. What changed: reasons to take this      Follow-up Information    [place]. Go on 07/13/2019.   Specialty: Cardiology Why: 2:30 PM, parking code [phone] information: [address] [id number] [place] [phone] [place]. Call on 07/13/2019.   Why: 9:30 am with Freeman Caldron PA Contact information: [address] [place] [address] [phone]         No Known Allergies  Consultations:  Cardiology   Procedures/Studies: Dg Chest Port 1 View  Result Date: 07/04/2019 CLINICAL DATA:  Chest pain EXAM: PORTABLE CHEST 1 VIEW COMPARISON:  Chest CT 03/11/2018 FINDINGS: Biventricular pacer including direct epicardial lead along the left heart border. Artifact from EKG leads. Normal heart size and mediastinal contours. No acute infiltrate or edema. No effusion or pneumothorax. No acute osseous findings. IMPRESSION: No evidence of active disease. Electronically Signed   By: Monte Fantasia M.D.   On: 07/04/2019 04:50     Transthoracic Echocardiogram (07/04/2019) IMPRESSIONS    1. The left ventricle has mild-moderately reduced systolic function, with an ejection fraction of 40-45%. The cavity size was normal. There is mild concentric left ventricular hypertrophy. Left ventricular diastolic Doppler parameters are consistent    with impaired relaxation. There is abnormal septal motion consistent with left bundle branch block. Left ventrical global hypokinesis without regional wall motion abnormalities.  2. Left atrial size was mildly dilated.  3. Right atrial size was mildly dilated.  4. The aorta is normal in size and structure.  5. When compared to the prior study: 03/11/2018, there appears to be loss of LV synchrony and decreased left ventricular systolic function. Consider re-evaluation of CRT.  FINDINGS  Left Ventricle: The left ventricle has mild-moderately reduced systolic function, with an ejection fraction of 40-45%. The cavity size was normal. There is mild concentric left ventricular hypertrophy. Left ventricular diastolic Doppler parameters are  consistent with impaired relaxation. Normal left ventricular filling pressures There is abnormal (paradoxical) septal motion, consistent with left bundle branch block. Left ventrical global hypokinesis without regional wall motion abnormalities.  Right Ventricle: The right ventricle has normal systolic function. The cavity was not assessed. There is no increase in right ventricular wall thickness. Right ventricular systolic pressure is normal with an estimated pressure of 27.5 mmHg. Pacing  wire/catheter visualized in the right ventricle.  Left Atrium: Left atrial size was mildly dilated.  Right Atrium: Right atrial size was mildly dilated. Right atrial pressure is estimated at 3 mmHg.  Interatrial Septum: No atrial level shunt detected by color flow Doppler.  Pericardium: There is  no evidence of pericardial effusion.  Mitral Valve: The mitral valve is normal in structure. Mitral valve regurgitation is trivial by color flow Doppler.  Tricuspid Valve: The tricuspid valve is normal in structure. Tricuspid valve regurgitation is mild by color flow Doppler.  Aortic Valve: The aortic valve is normal in structure. Aortic valve regurgitation was not  visualized by color flow Doppler.  Pulmonic Valve: The pulmonic valve was grossly normal. Pulmonic valve regurgitation is not visualized by color flow Doppler.  Aorta: The aorta is normal in size and structure.  Venous: The inferior vena cava is normal in size with greater than 50% respiratory variability.  Compared to previous exam: 03/11/2018, there appears to be loss of LV synchrony and decreased left ventricular systolic function. Consider re-evaluation of CRT.   Left heart catheterization (07/05/2019) Conclusion  1. Anomalous LCx off proximal RCA.  2. No significant coronary disease.   Suspect noncardiac chest pain (it started prior to him using cocaine).      Subjective: No chest pain.  Discharge Exam: Vitals:   07/06/19 0648 07/06/19 0731  BP: 126/74 131/78  Pulse: 70 72  Resp:  19  Temp:  98.6 F (37 C)  SpO2:  96%   Vitals:   07/05/19 2318 07/06/19 0352 07/06/19 0648 07/06/19 0731  BP: 110/68 119/81 126/74 131/78  Pulse:  72 70 72  Resp:    19  Temp: 98.7 F (37.1 C) 98.4 F (36.9 C)  98.6 F (37 C)  TempSrc: Oral Oral  Oral  SpO2:  98%  96%  Weight:      Height:        General: Pt is alert, awake, not in acute distress Cardiovascular: RRR, S1/S2 +, no rubs, no gallops Respiratory: CTA bilaterally, no wheezing, no rhonchi Abdominal: Soft, NT, ND, bowel sounds + Extremities: no edema, no cyanosis    The results of significant diagnostics from this hospitalization (including imaging, microbiology, ancillary and laboratory) are listed below for reference.     Microbiology: Recent Results (from the past 240 hour(s))  SARS Coronavirus 2 [facility] order, Performed in [facility] hospital lab) Nasopharyngeal Nasopharyngeal Swab     Status: None   Collection Time: 07/04/19  6:58 AM   Specimen: Nasopharyngeal Swab  Result Value Ref Range Status   SARS Coronavirus 2 NEGATIVE NEGATIVE Final    Comment: (NOTE) If result is NEGATIVE SARS-CoV-2  target nucleic acids are NOT DETECTED. The SARS-CoV-2 RNA is generally detectable in upper and lower  respiratory specimens during the acute phase of infection. The lowest  concentration of SARS-CoV-2 viral copies this assay can detect is 250  copies / mL. A negative result does not preclude SARS-CoV-2 infection  and should not be used as the sole basis for treatment or other  patient management decisions.  A negative result may occur with  improper specimen collection / handling, submission of specimen other  than nasopharyngeal swab, presence of viral mutation(s) within the  areas targeted by this assay, and inadequate number of viral copies  (<250 copies / mL). A negative result must be combined with clinical  observations, patient history, and epidemiological information. If result is POSITIVE SARS-CoV-2 target nucleic acids are DETECTED. The SARS-CoV-2 RNA is generally detectable in upper and lower  respiratory specimens dur ing the acute phase of infection.  Positive  results are indicative of active infection with SARS-CoV-2.  Clinical  correlation with patient history and other diagnostic information is  necessary to determine patient infection status.  Positive  results do  not rule out bacterial infection or co-infection with other viruses. If result is PRESUMPTIVE POSTIVE SARS-CoV-2 nucleic acids MAY BE PRESENT.   A presumptive positive result was obtained on the submitted specimen  and confirmed on repeat testing.  While 2019 novel coronavirus  (SARS-CoV-2) nucleic acids may be present in the submitted sample  additional confirmatory testing may be necessary for epidemiological  and / or clinical management purposes  to differentiate between  SARS-CoV-2 and other Sarbecovirus currently known to infect humans.  If clinically indicated additional testing with an alternate test  methodology [phone]) is advised. The SARS-CoV-2 RNA is generally  detectable in upper and lower  respiratory sp ecimens during the acute  phase of infection. The expected result is Negative. Fact Sheet for Patients:  StrictlyIdeas.no Fact Sheet for Healthcare Providers: BankingDealers.co.za This test is not yet approved or cleared by the [place] FDA and has been authorized for detection and/or diagnosis of SARS-CoV-2 by FDA under an Emergency Use Authorization (EUA).  This EUA will remain in effect (meaning this test can be used) for the duration of the COVID-19 declaration under Section 564(b)(1) of the Act, 21 U.S.C. section 360bbb-3(b)(1), unless the authorization is terminated or revoked sooner. Performed at [facility], [place] [address], [place], [place] [address]   MRSA PCR Screening     Status: None   Collection Time: 07/04/19 10:50 PM   Specimen: Nasal Mucosa; Nasopharyngeal  Result Value Ref Range Status   MRSA by PCR NEGATIVE NEGATIVE Final    Comment:        The GeneXpert MRSA Assay (FDA approved for NASAL specimens only), is one component of a comprehensive MRSA colonization surveillance program. It is not intended to diagnose MRSA infection nor to guide or monitor treatment for MRSA infections. Performed at [facility], [place] [address] [place], [place] [address]      Labs: BNP (last 3 results) No results for input(s): BNP in the last 8760 hours. Basic Metabolic Panel: Recent Labs  Lab 07/04/19 0415 07/05/19 0657 07/05/19 1509 07/06/19 0220  NA 137 139  --  138  K 3.7 4.1  --  3.8  CL 103 107  --  104  CO2 19* 22  --  23  GLUCOSE 108* 83  --  98  BUN 10 11  --  12  CREATININE 1.66* 1.44* 1.37* 1.52*  CALCIUM 8.9 8.5*  --  8.5*   Liver Function Tests: Recent Labs  Lab 07/04/19 1123  AST 16  ALT 16  ALKPHOS 70  BILITOT 0.7  PROT 6.3*  ALBUMIN 3.3*   No results for input(s): LIPASE, AMYLASE in the last 168 hours. No results for input(s): AMMONIA in the last 168  hours. CBC: Recent Labs  Lab 07/04/19 0415 07/05/19 0657 07/06/19 0220  WBC 11.7* 6.8 7.0  NEUTROABS 8.9*  --   --   HGB 15.1 14.9 14.6  HCT 46.3 47.5 46.2  MCV 82.8 83.8 84.0  PLT 305 289 300   Cardiac Enzymes: Recent Labs  Lab 07/04/19 1123  CKTOTAL 197   BNP: Invalid input(s): POCBNP CBG: No results for input(s): GLUCAP in the last 168 hours. D-Dimer No results for input(s): DDIMER in the last 72 hours. Hgb A1c No results for input(s): HGBA1C in the last 72 hours. Lipid Profile Recent Labs    07/04/19 1137  CHOL 189  HDL 32*  LDLCALC 141*  TRIG 80  CHOLHDL 5.9   Thyroid function studies  No results for input(s): TSH, T4TOTAL, T3FREE, THYROIDAB in the last 72 hours.  Invalid input(s): FREET3 Anemia work up No results for input(s): VITAMINB12, FOLATE, FERRITIN, TIBC, IRON, RETICCTPCT in the last 72 hours. Urinalysis    Component Value Date/Time   COLORURINE YELLOW 07/04/2019 0511   APPEARANCEUR CLEAR 07/04/2019 0511   LABSPEC 1.006 07/04/2019 0511   PHURINE 6.0 07/04/2019 0511   GLUCOSEU NEGATIVE 07/04/2019 0511   HGBUR SMALL (A) 07/04/2019 0511   BILIRUBINUR NEGATIVE 07/04/2019 0511   KETONESUR 5 (A) 07/04/2019 0511   PROTEINUR NEGATIVE 07/04/2019 0511   UROBILINOGEN 0.2 02/05/2015 1500   NITRITE NEGATIVE 07/04/2019 0511   LEUKOCYTESUR NEGATIVE 07/04/2019 0511   Sepsis Labs Invalid input(s): PROCALCITONIN,  WBC,  LACTICIDVEN Microbiology Recent Results (from the past 240 hour(s))  SARS Coronavirus 2 [facility] order, Performed in [facility] hospital lab) Nasopharyngeal Nasopharyngeal Swab     Status: None   Collection Time: 07/04/19  6:58 AM   Specimen: Nasopharyngeal Swab  Result Value Ref Range Status   SARS Coronavirus 2 NEGATIVE NEGATIVE Final    Comment: (NOTE) If result is NEGATIVE SARS-CoV-2 target nucleic acids are NOT DETECTED. The SARS-CoV-2 RNA is generally detectable in upper and lower  respiratory specimens during the acute phase  of infection. The lowest  concentration of SARS-CoV-2 viral copies this assay can detect is 250  copies / mL. A negative result does not preclude SARS-CoV-2 infection  and should not be used as the sole basis for treatment or other  patient management decisions.  A negative result may occur with  improper specimen collection / handling, submission of specimen other  than nasopharyngeal swab, presence of viral mutation(s) within the  areas targeted by this assay, and inadequate number of viral copies  (<250 copies / mL). A negative result must be combined with clinical  observations, patient history, and epidemiological information. If result is POSITIVE SARS-CoV-2 target nucleic acids are DETECTED. The SARS-CoV-2 RNA is generally detectable in upper and lower  respiratory specimens dur ing the acute phase of infection.  Positive  results are indicative of active infection with SARS-CoV-2.  Clinical  correlation with patient history and other diagnostic information is  necessary to determine patient infection status.  Positive results do  not rule out bacterial infection or co-infection with other viruses. If result is PRESUMPTIVE POSTIVE SARS-CoV-2 nucleic acids MAY BE PRESENT.   A presumptive positive result was obtained on the submitted specimen  and confirmed on repeat testing.  While 2019 novel coronavirus  (SARS-CoV-2) nucleic acids may be present in the submitted sample  additional confirmatory testing may be necessary for epidemiological  and / or clinical management purposes  to differentiate between  SARS-CoV-2 and other Sarbecovirus currently known to infect humans.  If clinically indicated additional testing with an alternate test  methodology [phone]) is advised. The SARS-CoV-2 RNA is generally  detectable in upper and lower respiratory sp ecimens during the acute  phase of infection. The expected result is Negative. Fact Sheet for Patients:   StrictlyIdeas.no Fact Sheet for Healthcare Providers: BankingDealers.co.za This test is not yet approved or cleared by the [place] FDA and has been authorized for detection and/or diagnosis of SARS-CoV-2 by FDA under an Emergency Use Authorization (EUA).  This EUA will remain in effect (meaning this test can be used) for the duration of the COVID-19 declaration under Section 564(b)(1) of the Act, 21 U.S.C. section 360bbb-3(b)(1), unless the authorization is terminated or revoked sooner. Performed at [facility], [address], [place], [place] [address]   MRSA PCR Screening     Status: None   Collection Time: 07/04/19 10:50 PM   Specimen: Nasal Mucosa; Nasopharyngeal  Result Value Ref Range Status   MRSA by PCR NEGATIVE NEGATIVE Final    Comment:        The GeneXpert MRSA Assay (FDA approved for NASAL specimens only), is one component of a comprehensive MRSA colonization surveillance program. It is not intended to diagnose MRSA infection nor to guide or monitor treatment for MRSA infections. Performed at [facility], [place] [address] [place], [place] [address]      Time coordinating discharge: 35 minutes  SIGNED:   Cordelia Poche, MD [facility] 07/06/2019, 10:56 AM

## 2019-07-06 NOTE — Progress Notes (Signed)
Removed PIV access and patient received discharge instructions. Patient understood it well. He got the taxi Voucher. [facility] called for discharge summary with medication list. Will talk to Care manager to send it. HS [employer]

## 2019-07-06 NOTE — Clinical Social Work Note (Signed)
LATE ENTRY: Discharge summary and H&P transmitted to [place], attention Benjamine Mola. Patient will admit to [facility] on Friday, 8/7.  Parag Dorton Givens, MSW, LCSW Licensed Clinical Social Worker [place] [phone]

## 2019-07-10 ENCOUNTER — Telehealth (HOSPITAL_COMMUNITY): Payer: Self-pay | Admitting: *Deleted

## 2019-07-10 NOTE — Telephone Encounter (Signed)
Copy of labs left at front desk for pick up.

## 2019-07-12 NOTE — Progress Notes (Signed)
Patient ID: Roger Crosby, male   DOB: 03-Apr-1961, 58 y.o.   MRN: [id number]  Virtual Visit via Telephone Note  I connected with Enzo Bi on 07/13/19 at  9:30 AM EDT by telephone and verified that I am speaking with the correct person using two identifiers.   I discussed the limitations, risks, security and privacy concerns of performing an evaluation and management service by telephone and the availability of in person appointments. I also discussed with the patient that there may be a patient responsible charge related to this service. The patient expressed understanding and agreed to proceed.  Patient location: My Location:  [place] office Persons on the call:   History of Present Illness:  After hospitalization 8/4-8/6.  From discharge summary: Brief/Interim Summary:  Admission HPI written by Fuller Plan, MD   OIT:GPQDI Millneris a 58 y.o.malewith medical history significant oftension, hyperlipidemia, chronic systolic congestive heart failure last EF 50 to 55% with grade 1 diastolic [id number], s/pSaint Jude CRT-D, nonischemic cardiomyopathy, stage III kidney disease, and cocaine abuse; who presents with complaints ofacute onset ofchest painyesterday evening after recently smoking cocaine. Patient reports the pain was substernal and felt like someone standing on his chest. Associated symptoms included diaphoresis, nausea, shortness of breath, and lightheadedness. Patient reports that however the last 2 months due to stressors he has been smoking cocaine almost every other day. Last cardiac cath in 2011 revealed nonischemic disease. He is followed in outpatient by Dr. Aundra Dubin of cardiology. In route with EMS patient was given full dose aspirin and nitroglycerin without relief of pain.  ED Course:Upon admission into the emergency department patient was noted to be afebrile, pulse 93-107, respirations 18-24, blood pressures elevated up to 140/95, and O2 saturation  maintained on room air. Labs revealed WBC 11.7, BUN 10, creatinine 1.66, and high-sensitivity troponin 28->27.COVID-19 screening negative. Chest x-ray showed no acute abnormalities. Urine drug screen was positive for cocaine. Pacemaker was interrogated revealing runs of atrial tachycardia but no VT/VF/A. fib. He was givenmorphinewithout relief of pain symptoms,andthereafterstarted on a heparin drip.He still complains of having chest discomfort at this time.   Hospital course:  Chest pain Appears to be secondary to cocaine use. EKG significant for q waves without evidence of ACS with Hs troponin that trended low and flat, also not consistent with ACS. No significant CAD on left heart catheterization. Cardiology recommendations to continue Entresto and Coreg. Added spironolactone for CHF. No current physical restrictions. Low risk for CAD source of chest pain. No NSTEMI.  CKD stage III Does not meat criteria for AKI. Baseline creatinine is about 1.4. remains stable. Recheck BMP in 1 week.  Chronic systolic heart failure EF of 40-45%. Euvolemic. Spironolactone added to regimen. Outpatient cardiology follow-up.  Cocaine abuse Patient is motivated to quit and plans to attend program on discharge.  Smoking Cessation discussed. Patient motivated.  Discharge Diagnoses:  Principal Problem:   Chest pain Active Problems:   Hyperlipidemia   Benign essential HTN   Chronic systolic CHF (congestive heart failure) (HCC)   S/P ICD (internal cardiac defibrillator) procedure   CKD (chronic kidney disease) stage 3, GFR 30-59 ml/min (HCC)   Elevated troponin   Cocaine use   Obesity (BMI 30.0-34.9)    Observations/Objective:   Assessment and Plan:   Follow Up Instructions:    I discussed the assessment and treatment plan with the patient. The patient was provided an opportunity to ask questions and all were answered. The patient agreed with the plan and demonstrated an  understanding of  the instructions.   The patient was advised to call back or seek an in-person evaluation if the symptoms worsen or if the condition fails to improve as anticipated.  I provided *** minutes of non-face-to-face time during this encounter.   Freeman Caldron, PA-C

## 2019-07-13 ENCOUNTER — Ambulatory Visit: Payer: Medicaid Other | Attending: Family Medicine | Admitting: Physician Assistant

## 2019-07-13 ENCOUNTER — Other Ambulatory Visit: Payer: Self-pay

## 2019-07-13 ENCOUNTER — Inpatient Hospital Stay (HOSPITAL_COMMUNITY): Payer: Medicaid Other

## 2019-07-17 ENCOUNTER — Encounter: Payer: Medicaid Other | Admitting: *Deleted

## 2019-07-18 ENCOUNTER — Telehealth: Payer: Self-pay

## 2019-07-18 NOTE — Telephone Encounter (Signed)
Left message for patient to remind of missed remote transmission.  

## 2019-07-25 ENCOUNTER — Other Ambulatory Visit (HOSPITAL_COMMUNITY): Admission: RE | Admit: 2019-07-25 | Payer: Medicaid Other | Source: Ambulatory Visit

## 2019-07-26 ENCOUNTER — Encounter: Payer: Self-pay | Admitting: Cardiology

## 2019-07-28 ENCOUNTER — Ambulatory Visit (HOSPITAL_BASED_OUTPATIENT_CLINIC_OR_DEPARTMENT_OTHER): Payer: Medicaid Other | Admitting: Cardiology

## 2019-07-31 ENCOUNTER — Other Ambulatory Visit (HOSPITAL_COMMUNITY): Payer: Self-pay | Admitting: Cardiology

## 2019-08-02 ENCOUNTER — Other Ambulatory Visit (HOSPITAL_COMMUNITY): Payer: Self-pay | Admitting: Cardiology

## 2019-08-02 MED ORDER — SPIRONOLACTONE 25 MG PO TABS: 12.5000 mg | ORAL_TABLET | Freq: Every day | ORAL | 1 refills | Status: DC

## 2019-08-02 MED ORDER — ENTRESTO 97-103 MG PO TABS: 1.0000 | ORAL_TABLET | Freq: Two times a day (BID) | ORAL | 1 refills | Status: DC

## 2019-08-02 MED ORDER — CARVEDILOL 6.25 MG PO TABS: 6.2500 mg | ORAL_TABLET | Freq: Two times a day (BID) | ORAL | 1 refills | Status: DC

## 2019-08-02 MED ORDER — ASPIRIN 81 MG PO TBEC: 81.0000 mg | DELAYED_RELEASE_TABLET | Freq: Every day | ORAL | 2 refills | Status: DC

## 2019-08-02 MED ORDER — OMEPRAZOLE 20 MG PO CPDR: 20.0000 mg | DELAYED_RELEASE_CAPSULE | Freq: Every day | ORAL | 1 refills | Status: DC

## 2019-08-02 NOTE — Telephone Encounter (Signed)
Patient called to request refills of all HF meds while he is in rehab

## 2019-08-16 ENCOUNTER — Telehealth (HOSPITAL_COMMUNITY): Payer: Self-pay | Admitting: *Deleted

## 2019-08-16 NOTE — Telephone Encounter (Signed)
Pt left VM requesting help getting entresto he said his insurance wont cover it.   Message routed to [facility] for assistance

## 2019-08-17 ENCOUNTER — Telehealth (HOSPITAL_COMMUNITY): Payer: Self-pay | Admitting: Pharmacy Technician

## 2019-08-17 NOTE — Telephone Encounter (Signed)
Advanced Heart Failure Patient Advocate Encounter  Prior Authorization for Delene Loll 97-103mg  has been approved.    PA# [id number] Effective dates: 08/17/2019 through 08/11/2020  Patients co-pay is $3.00  Meredith Mody and left voicemail of approval. Called [facility] and they were able to get a paid claim and will get his medication ready.  Charlann Boxer, CPhT

## 2019-08-17 NOTE — Telephone Encounter (Signed)
Received notification from Medicaid that prior authorization for Entresto 97-103mg  is required.  PA submitted on [employer]CT[employer]acks Confirmation #: [id number] W Status is pending  Called patient since he had questions about coverage to update him with the status of the prior authorization. He requested that I call Velva Harman with the approval.   Will continue to follow.  Charlann Boxer, CPhT

## 2019-08-29 ENCOUNTER — Encounter (HOSPITAL_COMMUNITY): Payer: Medicaid Other | Admitting: Cardiology

## 2019-11-16 ENCOUNTER — Encounter (HOSPITAL_COMMUNITY): Payer: Medicaid Other | Admitting: Cardiology

## 2019-12-21 ENCOUNTER — Encounter (HOSPITAL_COMMUNITY): Payer: Medicaid Other | Admitting: Cardiology

## 2020-02-19 ENCOUNTER — Inpatient Hospital Stay (HOSPITAL_COMMUNITY): Admission: RE | Admit: 2020-02-19 | Payer: Medicaid Other | Source: Ambulatory Visit | Admitting: Cardiology

## 2020-04-09 ENCOUNTER — Other Ambulatory Visit: Payer: Self-pay

## 2020-04-09 ENCOUNTER — Ambulatory Visit (HOSPITAL_COMMUNITY)
Admission: RE | Admit: 2020-04-09 | Discharge: 2020-04-09 | Disposition: A | Discharge status: Home / Self Care | Payer: Medicaid Other | Source: Ambulatory Visit | Attending: Cardiology | Admitting: Cardiology

## 2020-04-09 ENCOUNTER — Ambulatory Visit (INDEPENDENT_AMBULATORY_CARE_PROVIDER_SITE_OTHER): Payer: Medicaid Other | Admitting: *Deleted

## 2020-04-09 VITALS — BP 162/102 | HR 86 | Wt 231.8 lb

## 2020-04-09 DIAGNOSIS — G4733 Obstructive sleep apnea (adult) (pediatric): Secondary | ICD-10-CM | POA: Diagnosis not present

## 2020-04-09 DIAGNOSIS — K219 Gastro-esophageal reflux disease without esophagitis: Secondary | ICD-10-CM | POA: Insufficient documentation

## 2020-04-09 DIAGNOSIS — I428 Other cardiomyopathies: Secondary | ICD-10-CM | POA: Diagnosis not present

## 2020-04-09 DIAGNOSIS — Z7982 Long term (current) use of aspirin: Secondary | ICD-10-CM | POA: Diagnosis not present

## 2020-04-09 DIAGNOSIS — I447 Left bundle-branch block, unspecified: Secondary | ICD-10-CM

## 2020-04-09 DIAGNOSIS — I5022 Chronic systolic (congestive) heart failure: Secondary | ICD-10-CM | POA: Diagnosis present

## 2020-04-09 DIAGNOSIS — I129 Hypertensive chronic kidney disease with stage 1 through stage 4 chronic kidney disease, or unspecified chronic kidney disease: Secondary | ICD-10-CM | POA: Insufficient documentation

## 2020-04-09 DIAGNOSIS — I2510 Atherosclerotic heart disease of native coronary artery without angina pectoris: Secondary | ICD-10-CM | POA: Insufficient documentation

## 2020-04-09 DIAGNOSIS — G4730 Sleep apnea, unspecified: Secondary | ICD-10-CM

## 2020-04-09 DIAGNOSIS — Z79899 Other long term (current) drug therapy: Secondary | ICD-10-CM | POA: Insufficient documentation

## 2020-04-09 DIAGNOSIS — N183 Chronic kidney disease, stage 3 unspecified: Secondary | ICD-10-CM | POA: Insufficient documentation

## 2020-04-09 DIAGNOSIS — Z7901 Long term (current) use of anticoagulants: Secondary | ICD-10-CM | POA: Insufficient documentation

## 2020-04-09 DIAGNOSIS — Z8249 Family history of ischemic heart disease and other diseases of the circulatory system: Secondary | ICD-10-CM | POA: Diagnosis not present

## 2020-04-09 LAB — CUP PACEART REMOTE DEVICE CHECK
Battery Remaining Longevity: 22 mo
Battery Remaining Percentage: 29 %
Battery Voltage: 2.87 V
Brady Statistic AP VP Percent: 1 %
Brady Statistic AP VS Percent: 1 %
Brady Statistic AS VP Percent: 96 %
Brady Statistic AS VS Percent: 3.6 %
Brady Statistic RA Percent Paced: 1 %
Date Time Interrogation Session: 20210511092339
HighPow Impedance: 68 Ohm
HighPow Impedance: 68 Ohm
Implantable Lead Implant Date: 20160115
Implantable Lead Implant Date: 20160115
Implantable Lead Implant Date: 20160310
Implantable Lead Location: 753858
Implantable Lead Location: 753859
Implantable Lead Location: 753860
Implantable Lead Model: 5076
Implantable Lead Model: 511212
Implantable Lead Serial Number: 247936
Implantable Pulse Generator Implant Date: 20160115
Lead Channel Impedance Value: 350 Ohm
Lead Channel Impedance Value: 450 Ohm
Lead Channel Impedance Value: 460 Ohm
Lead Channel Pacing Threshold Amplitude: 0.625 V
Lead Channel Pacing Threshold Amplitude: 0.75 V
Lead Channel Pacing Threshold Amplitude: 1.125 V
Lead Channel Pacing Threshold Pulse Width: 0.5 ms
Lead Channel Pacing Threshold Pulse Width: 0.5 ms
Lead Channel Pacing Threshold Pulse Width: 0.5 ms
Lead Channel Sensing Intrinsic Amplitude: 12 mV
Lead Channel Sensing Intrinsic Amplitude: 5 mV
Lead Channel Setting Pacing Amplitude: 2 V
Lead Channel Setting Pacing Amplitude: 2.125
Lead Channel Setting Pacing Amplitude: 2.5 V
Lead Channel Setting Pacing Pulse Width: 0.5 ms
Lead Channel Setting Pacing Pulse Width: 0.5 ms
Lead Channel Setting Sensing Sensitivity: 0.5 mV
Pulse Gen Serial Number: 7209167

## 2020-04-09 LAB — BASIC METABOLIC PANEL
Anion gap: 10 (ref 5–15)
BUN: 19 mg/dL (ref 6–20)
CO2: 23 mmol/L (ref 22–32)
Calcium: 9 mg/dL (ref 8.9–10.3)
Chloride: 109 mmol/L (ref 98–111)
Creatinine, Ser: 1.65 mg/dL — ABNORMAL HIGH (ref 0.61–1.24)
GFR calc Af Amer: 52 mL/min — ABNORMAL LOW (ref >60)
GFR calc non Af Amer: 45 mL/min — ABNORMAL LOW (ref >60)
Glucose, Bld: 112 mg/dL — ABNORMAL HIGH (ref 70–99)
Potassium: 4 mmol/L (ref 3.5–5.1)
Sodium: 142 mmol/L (ref 135–145)

## 2020-04-09 MED ORDER — CARVEDILOL 12.5 MG PO TABS: 12.5000 mg | ORAL_TABLET | Freq: Two times a day (BID) | ORAL | 3 refills | Status: DC

## 2020-04-09 MED ORDER — SPIRONOLACTONE 25 MG PO TABS: 12.5000 mg | ORAL_TABLET | Freq: Every day | ORAL | 3 refills | Status: DC

## 2020-04-09 NOTE — Progress Notes (Signed)
Patient ID: Roger Crosby, male   DOB: [birth year], 59 y.o.   MRN: [id number]  Cardiology: Dr. Aundra Dubin  59 y.o. with history of LBBB and nonischemic cardiomyopathy returns for followup of CHF.  Echo in 2014 showed EF 20-25% with regional wall motion abnormalities.  I did a left heart cath in 9/14 that showed no obstructive disease.  He has a nonischemic cardiomyopathy.  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   He was lost to followup for about 2 years (was in prison).  He presented to [facility] ER in 4/19 with chest pain.  UDS was positive for cocaine.  He says that he "messed up" and was grieving for a death in the family.  No cocaine since, and he had no used any cocaine prior to that since leaving prison.  Echo in 4/19 showed EF 50-55% with mild MR.  Cardiolite in 4/19 showed EF 40% with no perfusion defect (low risk).    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  Echo in 8/20 showed EF 40-45%.    Sleep study was done showing severe OSA.   Patient spent time in a drug rehab facility in [place] for during the last 6 months.  He had been off cocaine, but had 1 episode of cocaine use last week after a friend died (grieving).    He is doing ok today.  Says that he will not be using any more cocaine.  No smoking or ETOH.  No dyspnea walking on flat ground or walking up a flight of stairs.  No orthopnea/PND.  No chest pain.  Doing some landscaping work irregularly.  BP is high today, says that he is taking all his meds.    St Jude device interrogation: Stable thoracic impedance, 95% BiV pacing   Labs (9/14): K 3.9, creatinine 1.6 Labs (10/14): K 4, creatinine 1.3 Labs (4/19): K 3.7, creatinine 1.43, LDL 127, HIV negative Labs (8/20): K 3.8, creatinine 1.52, LDL 141, HIV negative  ECG (personally reviewed): NSR, BiV paced  PMH: 1. Hypertension  2. GERD  3. Low back pain  4. LBBB: Newly noted 2011  5. Nonischemic  cardiomyopathy: Echo (5/11): EF 45-50%, mild hypokinesis of the apical anteroseptal wall, apical anterior wall, and true apex (LAD distribution), mild LAE.  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  Echo (6/14) with EF 20-25% with inferior/inferoseptal akinesis and hypokinesis elsewhere.  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  - St Jude CRT-D - Echo (4/19) with EF 50-55%, mild MR. - Cardiolite (4/19) with EF 40%, no significant perfusion defect.  - LHC (8/20): anomalous LCx off the RCA, but no significant CAD.  - Echo (8/20): EF 40-45%.  6. CKD  7. OSA: Severe on sleep study.   Family History:  Mother deceased CHF,DM,HTN  Father with heart trouble...died age 81.  1 sister Crohn's disease  1 sister healthy  1 brother died Prostate cancer/AIDS...was diagnosed with prostate cancer at age 51.  Father and mother both had MIs in their 27s   Social History:  [employer] information officer, does some landscaping now.   HS graduate.  Married  3 kids  Alcohol use-no. None since 2008  Drug use-prior cocaine but rare since 2008. Denies IVDU.  Prior smoker none since 2008.   Review of Systems  All systems reviewed and negative except as per HPI.   Current Outpatient  Medications  Medication Sig Dispense Refill  . aspirin 81 MG EC tablet Take 1 tablet (81 mg total) by mouth daily. 90 tablet 2  . carvedilol (COREG) 12.5 MG tablet Take 1 tablet (12.5 mg total) by mouth 2 (two) times daily. 180 tablet 3  . omeprazole (PRILOSEC) 20 MG capsule Take 1 capsule (20 mg total) by mouth daily. 90 capsule 1  . sacubitril-valsartan (ENTRESTO) 97-103 MG Take 1 tablet by mouth 2 (two) times daily. 180 tablet 1  . spironolactone (ALDACTONE) 25 MG tablet Take 0.5 tablets (12.5 mg total) by mouth daily. 45 tablet 3   No current facility-administered medications for this encounter.    BP (!) 162/102   Pulse 86   Wt 105.1 kg (231 lb 12.8 oz)    SpO2 96%   BMI 35.25 kg/m  General: NAD Neck: No JVD, no thyromegaly or thyroid nodule.  Lungs: Clear to auscultation bilaterally with normal respiratory effort. CV: Nondisplaced PMI.  Heart regular S1/S2, no S3/S4, no murmur.  No peripheral edema.  No carotid bruit.  Normal pedal pulses.  Abdomen: Soft, nontender, no hepatosplenomegaly, no distention.  Skin: Intact without lesions or rashes.  Neurologic: Alert and oriented x 3.  Psych: Normal affect. Extremities: No clubbing or cyanosis.  HEENT: Normal.   Assessment/Plan: 1. Nonischemic cardiomyopathy: Possibly related to cocaine, also HTN may play a role.  No ETOH currently, used cocaine once recently but denies any further use.  EF 20-25% with LBBB in 2014, had St Jude CRT-D device placed with improvement in LV function.  No significant CAD on cath, most recently in 8/20.  NYHA class II symptoms, no volume overload on exam or by Corevue.  4/19 echo showed improvement in EF to 50-55%, and 8/20 echo showed EF back down a bit to 40-45%. BP is elevated.  - Continue Entresto 97/103 bid.  - Increase Coreg to 12.5 mg bid.  - Add spironolactone to 12.5 mg daily. BMET today and again in 10 days.  - Repeat echo at followup with me in 3 months.   2. CKD: Stage 3. BMET today.  3. HTN: Increase Coreg and add spironolactone as above.  4. OSA: Needs CPAP titration, will contact Dr. Radford Pax.   Followup 3 wks with HF pharmacist for med titration and BP check, followup with me in 3 months with echo.   [name] 04/09/2020

## 2020-04-09 NOTE — Patient Instructions (Signed)
Labs done today. We will contact you only if your labs are abnormal.  INCREASE Carvedilol(Coreg) 12.5mg (1 tablet) by mouth twice daily.(a new Rx was sent to your Pharmacy)  START Spironolactone 12.5mg (1/2 tablet) by mouth once daily.(a new Rx was sent to your Pharmacy)  No other medication changes were made. Please continue all other medication as prescribed.  You have been referred to Dr. Radford Pax for Cpap Titration. Her office will contact you to schedule an appointment.  Your physician recommends that you schedule a follow-up appointment in: 10 days for a lab only appointment, 3 weeks for an appointment with our Clinic Pharmacist and in 3 months for an appointment with Dr. Aundra Dubin with an echo prior to your appointment.  Your physician has requested that you have an echocardiogram. Echocardiography is a painless test that uses sound waves to create images of your heart. It provides your doctor with information about the size and shape of your heart and how well your heart's chambers and valves are working. This procedure takes approximately one hour. There are no restrictions for this procedure.  At the [facility], you and your health needs are our priority. As part of our continuing mission to provide you with exceptional heart care, we have created designated Provider Care Teams. These Care Teams include your primary Cardiologist (physician) and Advanced Practice Providers (APPs- Physician Assistants and Nurse Practitioners) who all work together to provide you with the care you need, when you need it.   You may see any of the following providers on your designated Care Team at your next follow up: Marland Kitchen Dr Glori Bickers . Dr [name] . Darrick Grinder, NP . Lyda Jester, PA . Audry Riles, PharmD   Please be sure to bring in all your medications bottles to every appointment.

## 2020-04-11 NOTE — Progress Notes (Signed)
Remote ICD transmission.   

## 2020-04-12 ENCOUNTER — Telehealth: Payer: Self-pay | Admitting: *Deleted

## 2020-04-12 DIAGNOSIS — G4733 Obstructive sleep apnea (adult) (pediatric): Secondary | ICD-10-CM

## 2020-04-12 NOTE — Telephone Encounter (Signed)
Patient's test was cancelled once due to covid but the other 6 times the patient cancelled his titration and his covid test. It has been a year ordered on 02/01/19 and cancelled 6 times up to 07/27/20.  He has to start over with a new sleep study. =

## 2020-04-12 NOTE — Telephone Encounter (Signed)
-----   Message from [name], MD sent at 04/09/2020  9:22 PM EDT ----- Gae Bon this CPAP titration order was sent to you 11/2018 - Apparently it was cancelled 4 times due to COVID 19 - please set up in lab CPAP titration  Traci ----- Message ----- From: Larey Dresser, MD Sent: 04/09/2020   7:09 PM EDT To: [name], MD  This patient needs CPAP titration, can you facilitate?

## 2020-04-19 ENCOUNTER — Other Ambulatory Visit (HOSPITAL_COMMUNITY): Payer: Medicaid Other

## 2020-04-23 ENCOUNTER — Telehealth: Payer: Self-pay | Admitting: *Deleted

## 2020-04-23 NOTE — Telephone Encounter (Signed)
Patient is scheduled for lab study on 05/09/20. pt is scheduled for COVID screening on 05/07/20 9:30 prior to ss.  Patient understands his sleep study will be done at [facility] sleep lab. Patient understands he will receive a sleep packet in a week or so. Patient understands to call if  he does not receive the sleep packet in a timely manner. Patient agrees with treatment and thanked me for call Left detailed message on voicemail with date and time of titration and informed patient to call back to confirm or reschedule.

## 2020-04-23 NOTE — Telephone Encounter (Signed)
-----   Message from [name], MD sent at 04/18/2020 10:54 PM EDT ----- [place] please find out if patient is willing to proceed with repeat sleep study and if so then order a split night study  Traci ----- Message ----- From: Freada Bergeron, CMA Sent: 04/18/2020   5:00 PM EDT To: Larey Dresser, MD, [name], MD  Patient's test was cancelled once due to covid but the other 6 times the patient cancelled his titration and his covid test. It has been a year ordered on 02/01/19 and cancelled 6 times up to 07/27/20.  He has to start over with a new sleep study. ----- Message ----- From: [name], MD Sent: 04/09/2020   9:22 PM EDT To: Larey Dresser, MD, Freada Bergeron, CMA  [place] this CPAP titration order was sent to you 11/2018 - Apparently it was cancelled 4 times due to COVID 19 - please set up in lab CPAP titration  Traci ----- Message ----- From: Larey Dresser, MD Sent: 04/09/2020   7:09 PM EDT To: [name], MD  This patient needs CPAP titration, can you facilitate?

## 2020-04-23 NOTE — Addendum Note (Signed)
Addended by: Freada Bergeron on: 04/23/2020 02:29 PM   Modules accepted: Orders

## 2020-04-23 NOTE — Addendum Note (Signed)
Addended by: Freada Bergeron on: 04/23/2020 02:23 PM   Modules accepted: Orders

## 2020-04-24 ENCOUNTER — Other Ambulatory Visit (HOSPITAL_COMMUNITY): Payer: Medicaid Other

## 2020-04-24 NOTE — Progress Notes (Unsigned)
Virtual Visit via Video Note   This visit type was conducted due to national recommendations for restrictions regarding the COVID-19 Pandemic (e.g. social distancing) in an effort to limit this patient's exposure and mitigate transmission in our community.  Due to his co-morbid illnesses, this patient is at least at moderate risk for complications without adequate follow up.  This format is felt to be most appropriate for this patient at this time.  All issues noted in this document were discussed and addressed.  A limited physical exam was performed with this format.  Please refer to the patient's chart for his consent to telehealth for [facility].  Evaluation Performed:  Follow-up visit  This visit type was conducted due to national recommendations for restrictions regarding the COVID-19 Pandemic (e.g. social distancing).  This format is felt to be most appropriate for this patient at this time.  All issues noted in this document were discussed and addressed.  No physical exam was performed (except for noted visual exam findings with Video Visits).  Please refer to the patient's chart (MyChart message for video visits and phone note for telephone visits) for the patient's consent to telehealth for [facility].  Date:  04/24/2020   ID:  Roger Crosby, DOB [birth year], MRN [id number]  Patient Location:  Home  Provider location:   [place]  PCP:  Patient, No Pcp Per  Cardiologist: [name], MD Sleep Medicine:  Fransico Him, MD Electrophysiologist:  None   Chief Complaint:  OSA  History of Present Illness:    Roger Crosby is a 59 y.o. male who presents via audio/video conferencing for a telehealth visit today.    This is a 59yo AAM with a hx of NICM with chronic systolic CHF, cocaine use, CKD, HTN and hx of OSA on sleep study in 2016 which showed mild to moderate OSA with an AHI of 14.9/hr and CPAP was recommended but he never followed through.  He has been followed in AHF  clinic and was referred for repeat sleep study 11/2018 which showed severe OSA with an AHI of 57/hr and O2 desats as low as 81%.  He also had mild central sleep apnea with CAI of 9/hr.  CPAP titration was ordered but again he never followed through.  He is now here to discuss results and try to get him on PAP therapy.  Prior CV studies:   The following studies were reviewed today:  Sleep study 2016 and 2020  Past Medical History:  Diagnosis Date  . AICD (automatic cardioverter/defibrillator) present    a.  Unsuccessful LV lead placement 11/2014  . CAD (coronary artery disease)    a. nonobstructive by LHC 6/11: oOM1 40%, mild luminal irregs elsewhere b. cath 09/2014L no obstructive disease c. 03/2016: Low-risk NST  . CHF (congestive heart failure) ([place])   . CKD (chronic kidney disease)   . GERD (gastroesophageal reflux disease)   . High cholesterol   . HTN (hypertension)   . Hx of echocardiogram    echo 5/11: EF 45-50%, mild HK of Ap AS wall, ap Ant wall and true apex (LAD distribution), mild LAE  . LBBB (left bundle branch block)   . LBP (low back pain)   . NICM (nonischemic cardiomyopathy) ([place])   . Systolic heart failure [facility])    Past Surgical History:  Procedure Laterality Date  . Crosby-VENTRICULAR IMPLANTABLE CARDIOVERTER DEFIBRILLATOR N/A 12/14/2014   with failed LV lead placeemnt  . CARDIAC CATHETERIZATION Left 07/2013   with angiogram  . DEBRIDEMENT  AND CLOSURE WOUND Right 09/20/2015   Procedure: DEBRIDEMENT AND CLOSURE WOUND;  Surgeon: Dayna Barker, MD;  Location: [place];  Service: Plastics;  Laterality: Right;  . EPICARDIAL PACING LEAD PLACEMENT N/A 02/07/2015   eicardial LV lead placed by Dr Laverta Baltimore  . LEFT HEART CATH AND CORONARY ANGIOGRAPHY N/A 07/05/2019   Procedure: LEFT HEART CATH AND CORONARY ANGIOGRAPHY;  Surgeon: Larey Dresser, MD;  Location: [place] CV LAB;  Service: Cardiovascular;  Laterality: N/A;  . LEFT HEART CATHETERIZATION WITH CORONARY ANGIOGRAM N/A  08/04/2013   Procedure: LEFT HEART CATHETERIZATION WITH CORONARY ANGIOGRAM;  Surgeon: Larey Dresser, MD;  Location: [facility] CATH LAB;  Service: Cardiovascular;  Laterality: N/A;  . TENDON REPAIR Left 09/20/2015   Procedure: repair nerve tendon wrist;  Surgeon: Dayna Barker, MD;  Location: [place];  Service: Plastics;  Laterality: Left;  . THORACOTOMY Left 02/07/2015   Procedure: THORACOTOMY MAJOR;  Surgeon: Gaye Pollack, MD;  Location: [place];  Service: Thoracic;  Laterality: Left;     No outpatient medications have been marked as taking for the 04/25/20 encounter (Appointment) with [name], MD.     Allergies:   Patient has no known allergies.   Social History   Tobacco Use  . Smoking status: Current Some Day Smoker    Packs/day: 0.12    Years: 25.00    Pack years: 3.00    Types: Cigarettes  . Smokeless tobacco: Never Used  Substance Use Topics  . Alcohol use: Yes    Comment: social  . Drug use: Yes    Types: "Crack" cocaine     Family Hx: The patient's family history includes Congestive Heart Failure in his mother; Crohn's disease in his sister; Diabetes in his mother; HIV/AIDS in his brother; Heart Problems in his father; Heart attack in his father and mother; Heart disease in his father and mother; Hypertension in his mother; Prostate cancer in his brother.  ROS:   Please see the history of present illness.     All other systems reviewed and are negative.   Labs/Other Tests and Data Reviewed:    Recent Labs: 07/04/2019: ALT 16 07/06/2019: Hemoglobin 14.6; Platelets 300 04/09/2020: BUN 19; Creatinine, Ser 1.65; Potassium 4.0; Sodium 142   Recent Lipid Panel Lab Results  Component Value Date/Time   CHOL 189 07/04/2019 11:37 AM   TRIG 80 07/04/2019 11:37 AM   HDL 32 (L) 07/04/2019 11:37 AM   CHOLHDL 5.9 07/04/2019 11:37 AM   LDLCALC 141 (H) 07/04/2019 11:37 AM   LDLDIRECT 155.3 04/24/2010 04:18 PM    Wt Readings from Last 3 Encounters:  04/09/20 231 lb 12.8 oz  (105.1 kg)  07/05/19 219 lb 4.8 oz (99.5 kg)  12/04/18 220 lb (99.8 kg)     Objective:    Vital Signs:  There were no vitals taken for this visit.   CONSTITUTIONAL:  Well nourished, well developed male in no acute distress.  EYES: anicteric MOUTH: oral mucosa is pink RESPIRATORY: Normal respiratory effort, symmetric expansion CARDIOVASCULAR: No peripheral edema SKIN: No rash, lesions or ulcers MUSCULOSKELETAL: no digital cyanosis NEURO: Cranial Nerves II-XII grossly intact, moves all extremities PSYCH: Intact judgement and insight.  A&O x 3, Mood/affect appropriate   ASSESSMENT & PLAN:    1.  OSA -he had a sleep study in 2016 showing mild to moderate OSA and then in 2020 showing severe OSA with mild Central sleep apnea but has not been compliant in following through with PAP titration -since it has  been a year since his last sleep study he will need to repeat a PSG in order for insurance to pay for his device -I will arrange a split night sleep study -due to his severe OSA and complex apnea with central events as well, recommend in lab study as titration will likely be difficult  2.  HTN -BP controlled -continue carvedilol 12.5mg  BID, Entresto 97-103mg  BID and spiro 12.5mg  daily  3.  Morbid Obesity -I have encouraged him to get into a routine exercise program and cut back on carbs and portions.   COVID-19 Education: The signs and symptoms of COVID-19 were discussed with the patient and how to seek care for testing (follow up with PCP or arrange E-visit).  The importance of social distancing was discussed today.  Patient Risk:   After full review of this patient's clinical status, I feel that they are at least moderate risk at this time.  Time:   Today, I have spent 20 minutes on telemedicine discussing medical problems including OSA, HTN, Obesity and reviewing patient's chart including sleep studies.  Medication Adjustments/Labs and Tests Ordered: Current medicines are  reviewed at length with the patient today.  Concerns regarding medicines are outlined above.  Tests Ordered: No orders of the defined types were placed in this encounter.  Medication Changes: No orders of the defined types were placed in this encounter.   Disposition:  Follow up prn after sleep study  Signed, Fransico Him, MD  04/24/2020 10:33 PM    [facility]

## 2020-04-25 ENCOUNTER — Other Ambulatory Visit: Payer: Self-pay

## 2020-04-25 ENCOUNTER — Telehealth: Payer: Medicaid Other | Admitting: Cardiology

## 2020-04-25 DIAGNOSIS — I10 Essential (primary) hypertension: Secondary | ICD-10-CM

## 2020-04-25 DIAGNOSIS — G4733 Obstructive sleep apnea (adult) (pediatric): Secondary | ICD-10-CM

## 2020-05-07 ENCOUNTER — Other Ambulatory Visit (HOSPITAL_COMMUNITY)
Admission: RE | Admit: 2020-05-07 | Discharge: 2020-05-07 | Disposition: A | Discharge status: Home / Self Care | Payer: Medicaid Other | Source: Ambulatory Visit | Attending: Cardiology | Admitting: Cardiology

## 2020-05-07 DIAGNOSIS — Z01812 Encounter for preprocedural laboratory examination: Secondary | ICD-10-CM | POA: Diagnosis present

## 2020-05-07 DIAGNOSIS — Z20822 Contact with and (suspected) exposure to covid-19: Secondary | ICD-10-CM | POA: Diagnosis not present

## 2020-05-07 LAB — SARS CORONAVIRUS 2 (TAT 6-24 HRS): SARS Coronavirus 2: NEGATIVE

## 2020-05-08 ENCOUNTER — Inpatient Hospital Stay (HOSPITAL_COMMUNITY): Admission: RE | Admit: 2020-05-08 | Payer: Medicaid Other | Source: Ambulatory Visit

## 2020-05-09 ENCOUNTER — Ambulatory Visit (HOSPITAL_BASED_OUTPATIENT_CLINIC_OR_DEPARTMENT_OTHER): Payer: Medicaid Other | Admitting: Cardiology

## 2020-05-13 ENCOUNTER — Other Ambulatory Visit (HOSPITAL_COMMUNITY): Payer: Self-pay | Admitting: Cardiology

## 2020-05-14 ENCOUNTER — Other Ambulatory Visit: Payer: Medicaid Other

## 2020-05-24 ENCOUNTER — Other Ambulatory Visit (HOSPITAL_COMMUNITY): Payer: Self-pay | Admitting: Cardiology

## 2020-05-27 ENCOUNTER — Other Ambulatory Visit (HOSPITAL_COMMUNITY): Payer: Medicaid Other

## 2020-05-29 ENCOUNTER — Other Ambulatory Visit: Payer: Self-pay | Admitting: Internal Medicine

## 2020-05-29 ENCOUNTER — Other Ambulatory Visit: Payer: Self-pay

## 2020-05-29 ENCOUNTER — Ambulatory Visit (HOSPITAL_BASED_OUTPATIENT_CLINIC_OR_DEPARTMENT_OTHER): Payer: Medicaid Other | Attending: Cardiology | Admitting: Cardiology

## 2020-05-29 DIAGNOSIS — G4733 Obstructive sleep apnea (adult) (pediatric): Secondary | ICD-10-CM | POA: Diagnosis not present

## 2020-06-04 ENCOUNTER — Inpatient Hospital Stay (HOSPITAL_COMMUNITY)
Admission: RE | Admit: 2020-06-04 | Discharge: 2020-06-04 | Disposition: A | Discharge status: Home / Self Care | Payer: Medicaid Other | Source: Ambulatory Visit

## 2020-06-15 NOTE — Procedures (Signed)
    Patient Name: Roger Crosby, Roger Crosby Date: 05/29/2020 Gender: Male D.O.B: [birth year] Age (years): 88 Referring Provider: Fransico Him MD, ABSM Height (inches): 12 Interpreting Physician: Fransico Him MD, ABSM Weight (lbs): 230 RPSGT: Zadie Rhine BMI: 35 MRN: [id number] Neck Size: 17.00  CLINICAL INFORMATION Sleep Study Type: NPSG  Indication for sleep study: OSA  Epworth Sleepiness Score: 9  Most recent polysomnogram dated 12/04/2018 revealed an AHI of 57.6/h and RDI of 58.1/h.  SLEEP STUDY TECHNIQUE As per the AASM Manual for the Scoring of Sleep and Associated Events v2.3 (April 2016) with a hypopnea requiring 4% desaturations.  The channels recorded and monitored were frontal, central and occipital EEG, electrooculogram (EOG), submentalis EMG (chin), nasal and oral airflow, thoracic and abdominal wall motion, anterior tibialis EMG, snore microphone, electrocardiogram, and pulse oximetry.  MEDICATIONS Medications self-administered by patient taken the night of the study : N/A  SLEEP ARCHITECTURE The study was initiated at 10:56:42 PM and ended at 4:49:47 AM.  Sleep onset time was 29.0 minutes and the sleep efficiency was 71.7%. The total sleep time was 253 minutes.  Stage REM latency was 75.0 minutes.  The patient spent 4.0% of the night in stage N1 sleep, 73.1% in stage N2 sleep, 0.2% in stage N3 and 22.7% in REM.  Alpha intrusion was absent.  Supine sleep was 38.54%.  RESPIRATORY PARAMETERS The overall apnea/hypopnea index (AHI) was 0.9 per hour. There were 1 total apneas, including 1 obstructive, 0 central and 0 mixed apneas. There were 3 hypopneas and 13 RERAs.  The AHI during Stage REM sleep was 1.0 per hour.  AHI while supine was 1.8 per hour.  The mean oxygen saturation was 94.0%. The minimum SpO2 during sleep was 89.0%.  moderate snoring was noted during this study.  CARDIAC DATA The 2 lead EKG demonstrated sinus rhythm. The mean heart rate was 78.1  beats per minute. Other EKG findings include: None.  LEG MOVEMENT DATA The total PLMS were 0 with a resulting PLMS index of 0.0. Associated arousal with leg movement index was 0.0 .  IMPRESSIONS - No significant obstructive sleep apnea occurred during this study (AHI = 0.9/h). - No significant central sleep apnea occurred during this study (CAI = 0.0/h). - The patient had minimal or no oxygen desaturation during the study (Min O2 = 89.0%) - The patient snored with moderate snoring volume. - No cardiac abnormalities were noted during this study. - Clinically significant periodic limb movements did not occur during sleep. No significant associated arousals.  DIAGNOSIS - Normal Study RECOMMENDATIONS - Given marked change in results compared to prior study, will repeat additional night PSG.  - Avoid alcohol, sedatives and other CNS depressants that may worsen sleep apnea and disrupt normal sleep architecture. - Sleep hygiene should be reviewed to assess factors that may improve sleep quality. - Weight management and regular exercise should be initiated or continued if appropriate.  [Electronically signed] 06/15/2020 02:31 PM  Fransico Him MD, ABSM Diplomate, American Board of Sleep Medicine

## 2020-06-17 ENCOUNTER — Telehealth: Payer: Self-pay | Admitting: *Deleted

## 2020-06-17 DIAGNOSIS — G4733 Obstructive sleep apnea (adult) (pediatric): Secondary | ICD-10-CM

## 2020-06-17 NOTE — Telephone Encounter (Signed)
Called results lmtcb. 

## 2020-06-17 NOTE — Telephone Encounter (Signed)
-----   Message from [name], MD sent at 06/15/2020  2:34 PM EDT ----- No OSA but given significant difference between this and last study please get a home sleep study

## 2020-06-18 NOTE — Telephone Encounter (Signed)
Informed patient of sleep study results and patient understanding was verbalized. Patient understands his sleep study showed No OSA but given significant difference between this and last study please get a home sleep study. Pt is aware and agreeable to his results.  Home sleep study order placed

## 2020-06-20 NOTE — Telephone Encounter (Signed)
Patient is aware and agreeable to Home Sleep Study through [facility]. Patient is scheduled for 08/09/20 at 12 p to pick up home sleep kit and meet with Respiratory therapist at [facility]. Patient is aware that if this appointment date and time does not work for them they should contact Artis Delay directly at [phone]. Patient is aware that a sleep packet will be sent from [facility] in week. Patient is agreeable to treatment and thankful for call.

## 2020-07-01 ENCOUNTER — Other Ambulatory Visit (HOSPITAL_COMMUNITY): Payer: Self-pay | Admitting: *Deleted

## 2020-07-01 MED ORDER — ENTRESTO 97-103 MG PO TABS: 1.0000 | ORAL_TABLET | Freq: Two times a day (BID) | ORAL | 1 refills | Status: DC

## 2020-07-03 ENCOUNTER — Encounter (HOSPITAL_COMMUNITY): Payer: Self-pay

## 2020-07-03 ENCOUNTER — Ambulatory Visit (HOSPITAL_COMMUNITY)
Admission: RE | Admit: 2020-07-03 | Discharge: 2020-07-03 | Disposition: A | Discharge status: Home / Self Care | Payer: Medicaid Other | Source: Ambulatory Visit | Attending: Cardiology | Admitting: Cardiology

## 2020-07-03 ENCOUNTER — Other Ambulatory Visit: Payer: Self-pay

## 2020-07-03 VITALS — BP 124/86 | HR 82 | Wt 228.6 lb

## 2020-07-03 DIAGNOSIS — N183 Chronic kidney disease, stage 3 unspecified: Secondary | ICD-10-CM | POA: Diagnosis not present

## 2020-07-03 DIAGNOSIS — I5022 Chronic systolic (congestive) heart failure: Secondary | ICD-10-CM | POA: Diagnosis not present

## 2020-07-03 DIAGNOSIS — G4733 Obstructive sleep apnea (adult) (pediatric): Secondary | ICD-10-CM | POA: Diagnosis not present

## 2020-07-03 DIAGNOSIS — Z9989 Dependence on other enabling machines and devices: Secondary | ICD-10-CM | POA: Diagnosis not present

## 2020-07-03 DIAGNOSIS — F1411 Cocaine abuse, in remission: Secondary | ICD-10-CM | POA: Diagnosis not present

## 2020-07-03 DIAGNOSIS — I447 Left bundle-branch block, unspecified: Secondary | ICD-10-CM | POA: Diagnosis not present

## 2020-07-03 DIAGNOSIS — Z79899 Other long term (current) drug therapy: Secondary | ICD-10-CM | POA: Insufficient documentation

## 2020-07-03 DIAGNOSIS — I130 Hypertensive heart and chronic kidney disease with heart failure and stage 1 through stage 4 chronic kidney disease, or unspecified chronic kidney disease: Secondary | ICD-10-CM | POA: Diagnosis not present

## 2020-07-03 DIAGNOSIS — Z5181 Encounter for therapeutic drug level monitoring: Secondary | ICD-10-CM | POA: Diagnosis not present

## 2020-07-03 LAB — BASIC METABOLIC PANEL
Anion gap: 8 (ref 5–15)
BUN: 13 mg/dL (ref 6–20)
CO2: 22 mmol/L (ref 22–32)
Calcium: 9 mg/dL (ref 8.9–10.3)
Chloride: 108 mmol/L (ref 98–111)
Creatinine, Ser: 1.32 mg/dL — ABNORMAL HIGH (ref 0.61–1.24)
GFR calc Af Amer: >60 mL/min (ref >60)
GFR calc non Af Amer: 59 mL/min — ABNORMAL LOW (ref >60)
Glucose, Bld: 130 mg/dL — ABNORMAL HIGH (ref 70–99)
Potassium: 4 mmol/L (ref 3.5–5.1)
Sodium: 138 mmol/L (ref 135–145)

## 2020-07-03 MED ORDER — SPIRONOLACTONE 25 MG PO TABS: 25.0000 mg | ORAL_TABLET | Freq: Every day | ORAL | 3 refills | Status: DC

## 2020-07-03 NOTE — Patient Instructions (Signed)
It was a pleasure seeing you today!  MEDICATIONS: -We are changing your medications today -Increase spironolactone to 25 mg (1 tablet) daily -Call if you have questions about your medications.  LABS:s -We will call you if your labs need attention.  NEXT APPOINTMENT: Return to clinic in 1 week with Dr. Aundra Dubin.  In general, to take care of your heart failure: -Limit your fluid intake to 2 Liters (half-gallon) per day.   -Limit your salt intake to ideally 2-3 grams (2000-3000 mg) per day. -Weigh yourself daily and record, and bring that "weight diary" to your next appointment.  (Weight gain of 2-3 pounds in 1 day typically means fluid weight.) -The medications for your heart are to help your heart and help you live longer.   -Please contact us before stopping any of your heart medications.  Call the clinic at [phone] with questions or to reschedule future appointments.

## 2020-07-03 NOTE — Progress Notes (Signed)
Cardiology: Dr. Aundra Dubin  HPI:  59 y.o. with history of LBBB and nonischemic cardiomyopathy returns for followup of CHF.  Echo in 2014 showed EF 20-25% with regional wall motion abnormalities.  Left heart cath in 08/13/20 showed no obstructive disease.  He has a nonischemic cardiomyopathy.  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    He was lost to followup for about 2 years (was in prison).  He presented to [facility] ER in 4/19 with chest pain.  UDS was positive for cocaine.  He says that he "messed up" and was grieving for a death in the family.  No cocaine since, and he had not used any cocaine prior to that since leaving prison.  Echo in 4/19 showed EF 50-55% with mild MR.  Cardiolite in 4/19 showed EF 40% with no perfusion defect (low risk).     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  Echo in 8/20 showed EF 40-45%.     Sleep study was done showing severe OSA.    Patient spent time in a drug rehab facility in [place] for during the last 6 months.  He had been off cocaine, but had 1 episode of cocaine use last week after a friend died (grieving).     Recently presented to HF Clinic on 04/09/20 with Dr. Aundra Dubin. Stated that he will not be using any more cocaine.  No smoking or ETOH.  No dyspnea walking on flat ground or walking up a flight of stairs.  No orthopnea/PND.  No chest pain.  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  BP was high in clinic, stated that he was taking all his medications.    Today he returns to HF clinic for pharmacist medication titration. At last visit with MD, carvedilol was increased to 12.5 mg BID and spironolactone 12.5 mg daily was initiated. Seemed well in clinic today, denying any dizziness, fatigue. He reported feeling "woozy" on some mornings that dissipates after a few seconds. Denies palpitations. Reported some moderate intermittent chest pain that lasts a few minutes. No SOB or  DOE. Doesn't routinely get home weights, but has been stable at 228-230 lbs. He does not take any diuretic. No LEE, PND, orthopnea. Ran out of Entresto two days ago, but otherwise takes all medications as prescribed and is tolerating them well.    Shortness of breath/dyspnea on exertion? no   Orthopnea/PND? no  Edema? no  Lightheadedness/dizziness? no  Daily weights at home? Not regularly  Blood pressure/heart rate monitoring at home? no  Following low-sodium/fluid-restricted diet? yes  HF Medications: -Carvedilol 12.5 mg BID -Entresto 97/103 mg BID -Spironolactone 12.5 mg daily  Has the patient been experiencing any side effects to the medications prescribed?  no  Does the patient have any problems obtaining medications due to transportation or finances?   No - has BCBS Medicaid  Understanding of regimen: good Understanding of indications: good Potential of compliance: good Patient understands to avoid NSAIDs. Patient understands to avoid decongestants.    Pertinent Lab Values: 07/03/2020  Serum creatinine 1.32, BUN 13, Potassium 4.0, Sodium 138, BNP 18.3 pg/mL (01/23/16)  Vital Signs:  Weight: 228 lbs (last clinic weight: 231 lbs)  Blood pressure: 124/86    Heart rate: 82   Assessment: 1. Nonischemic cardiomyopathy: Possibly related to cocaine although HTN may play a role.  No ETOH currently, used cocaine once recently but denies any further use.  EF  20-25% with LBBB in 2014, had [employer] CRT-D device placed with improvement in LV function.  No significant CAD on cath, most recently in 07/2019.  Echo 02/2018 showed improvement in EF to 50-55%, and 07/2019 echo showed EF back down a bit to 40-45%.  - NYHA class II symptoms, euvolemic on exam - Continue carvedilol 12.5 mg BID.  - Continue Entresto 97/103 mg BID.  - Increase spironolactone to 25 mg daily.  - BMET 07/03/20- potassium and renal function WNL. Repeat BMET at next follow up visit.  2. CKD: Stage 3.  - BMET  07/03/20 electrolytes WNL. Cr 1.32 (down from 1.65 on 04/09/20)  3. HTN:  - Increase spironolactone to 25 mg daily as above.  - Continue carvedilol and Entresto as above.  4. OSA: Needs CPAP titration, will contact Dr. Radford Pax.    Plan: 1) Medication changes: Based on clinical presentation, vital signs and recent labs will increase spironolactone to 25 mg daily. 3) Follow-up:  ECHO with Dr. Aundra Dubin 08/28/20   Audry Riles, PharmD, BCPS, BCCP, CPP Heart Failure Clinic Pharmacist [phone]

## 2020-07-04 ENCOUNTER — Other Ambulatory Visit (HOSPITAL_COMMUNITY): Payer: Self-pay | Admitting: Surgery

## 2020-07-04 MED ORDER — ENTRESTO 97-103 MG PO TABS: 1.0000 | ORAL_TABLET | Freq: Two times a day (BID) | ORAL | 1 refills | Status: DC

## 2020-07-12 ENCOUNTER — Encounter (HOSPITAL_COMMUNITY): Payer: Medicaid Other | Admitting: Cardiology

## 2020-07-12 ENCOUNTER — Other Ambulatory Visit (HOSPITAL_COMMUNITY): Payer: Medicaid Other

## 2020-07-17 ENCOUNTER — Ambulatory Visit (INDEPENDENT_AMBULATORY_CARE_PROVIDER_SITE_OTHER): Payer: Medicaid Other | Admitting: *Deleted

## 2020-07-17 DIAGNOSIS — I447 Left bundle-branch block, unspecified: Secondary | ICD-10-CM | POA: Diagnosis not present

## 2020-07-17 LAB — CUP PACEART REMOTE DEVICE CHECK
Battery Remaining Longevity: 22 mo
Battery Remaining Percentage: 28 %
Battery Voltage: 2.86 V
Brady Statistic AP VP Percent: 1 %
Brady Statistic AP VS Percent: 1 %
Brady Statistic AS VP Percent: 96 %
Brady Statistic AS VS Percent: 3.3 %
Brady Statistic RA Percent Paced: 1 %
Date Time Interrogation Session: 20210817131910
HighPow Impedance: 68 Ohm
HighPow Impedance: 68 Ohm
Implantable Lead Implant Date: 20160115
Implantable Lead Implant Date: 20160115
Implantable Lead Implant Date: 20160310
Implantable Lead Location: 753858
Implantable Lead Location: 753859
Implantable Lead Location: 753860
Implantable Lead Model: 5076
Implantable Lead Model: 511212
Implantable Lead Serial Number: 247936
Implantable Pulse Generator Implant Date: 20160115
Lead Channel Impedance Value: 390 Ohm
Lead Channel Impedance Value: 450 Ohm
Lead Channel Impedance Value: 510 Ohm
Lead Channel Pacing Threshold Amplitude: 0.625 V
Lead Channel Pacing Threshold Amplitude: 0.75 V
Lead Channel Pacing Threshold Amplitude: 1 V
Lead Channel Pacing Threshold Pulse Width: 0.5 ms
Lead Channel Pacing Threshold Pulse Width: 0.5 ms
Lead Channel Pacing Threshold Pulse Width: 0.5 ms
Lead Channel Sensing Intrinsic Amplitude: 12 mV
Lead Channel Sensing Intrinsic Amplitude: 4.8 mV
Lead Channel Setting Pacing Amplitude: 2 V
Lead Channel Setting Pacing Amplitude: 2 V
Lead Channel Setting Pacing Amplitude: 2.5 V
Lead Channel Setting Pacing Pulse Width: 0.5 ms
Lead Channel Setting Pacing Pulse Width: 0.5 ms
Lead Channel Setting Sensing Sensitivity: 0.5 mV
Pulse Gen Serial Number: 7209167

## 2020-07-19 NOTE — Progress Notes (Signed)
Remote ICD transmission.   

## 2020-07-26 ENCOUNTER — Ambulatory Visit: Payer: Medicaid Other | Attending: Internal Medicine | Admitting: Internal Medicine

## 2020-07-26 ENCOUNTER — Encounter: Payer: Self-pay | Admitting: Internal Medicine

## 2020-07-26 ENCOUNTER — Other Ambulatory Visit: Payer: Self-pay

## 2020-07-26 VITALS — BP 145/93 | HR 84 | Temp 98.5°F | Resp 16 | Ht 68.0 in | Wt 230.2 lb

## 2020-07-26 DIAGNOSIS — F17200 Nicotine dependence, unspecified, uncomplicated: Secondary | ICD-10-CM | POA: Diagnosis not present

## 2020-07-26 DIAGNOSIS — I10 Essential (primary) hypertension: Secondary | ICD-10-CM

## 2020-07-26 DIAGNOSIS — R358 Other polyuria: Secondary | ICD-10-CM | POA: Diagnosis not present

## 2020-07-26 DIAGNOSIS — E6609 Other obesity due to excess calories: Secondary | ICD-10-CM

## 2020-07-26 DIAGNOSIS — Z7689 Persons encountering health services in other specified circumstances: Secondary | ICD-10-CM

## 2020-07-26 DIAGNOSIS — I428 Other cardiomyopathies: Secondary | ICD-10-CM

## 2020-07-26 DIAGNOSIS — Z2821 Immunization not carried out because of patient refusal: Secondary | ICD-10-CM

## 2020-07-26 DIAGNOSIS — Z6835 Body mass index (BMI) 35.0-35.9, adult: Secondary | ICD-10-CM

## 2020-07-26 DIAGNOSIS — Z1211 Encounter for screening for malignant neoplasm of colon: Secondary | ICD-10-CM

## 2020-07-26 DIAGNOSIS — R3589 Other polyuria: Secondary | ICD-10-CM | POA: Insufficient documentation

## 2020-07-26 HISTORY — DX: Nicotine dependence, unspecified, uncomplicated: F17.200

## 2020-07-26 MED ORDER — DOXAZOSIN MESYLATE 1 MG PO TABS: 1.0000 mg | ORAL_TABLET | Freq: Every day | ORAL | 3 refills | Status: DC

## 2020-07-26 NOTE — Patient Instructions (Signed)
I have sent a prescription to your pharmacy for medication called doxazosin.  You should take it once a day in the evenings.  Please remember to return to the lab on Monday to have your blood test done.

## 2020-07-26 NOTE — Progress Notes (Signed)
Patient ID: Roger Crosby, male    DOB: 08/24/1961  MRN: [id number]  CC: New Patient (Initial Visit)   Subjective: Shelia Crosby is a 59 y.o. male who presents for new pt visit  His concerns today include:  Patient with history of chronic systolic CHF, NICM has ICD, HL, essential HTN, substance abuse, obesity, tob use  Does not recall name of previous primary physician.  Last seen about 2 yrs ago.   HYPERTENSION/NICM/chronic CHF Patient followed by Dr. Benjamine Mola from cardiology. Currently taking: see medication list Med Adherence: [x]  Yes - did not take Coreg in 2 days.  He forgot to place his refill bottle with his other prescriptions. Medication side effects: []  Yes    [x]  No Adherence with salt restriction: [x]  Yes    []  No Home Monitoring?: []  Yes    [x]  No Monitoring Frequency: []  Yes    []  No Home BP results range: []  Yes    []  No SOB? []  Yes    [x]  No Chest Pain?: []  Yes    [x]  No Leg swelling?: []  Yes    [x]  No Headaches?: []  Yes    [x]  No Dizziness? []  Yes    [x]  No Comments:   Tob Dep:  Smokes 5-6 cig/wk.  Smoked intermittently since adulthood.  Had quit for 6 yrs.  Trying to quit now Quit cocaine 6 mths ago.  Did not go through treatment program.   Main concern today is frequent urination at nights.  He wakes up 4-5 times at night to urinate.  Urine flow initially very slow but then flows freely.  He endorses post void dribbling.  He feels that he does not completely empty.  Symptoms have been going on for 6 months.  He denies any dysuria, or hematuria.    HM  Decline flu shot today.  Requesting referral for colonoscopy for colon cancer screening.  I see on his health maintenance that he had colonoscopy in 2016.  However patient states that he never did have the colonoscopy.  He is completed the COVID-19 vaccine series.  Past medical, surgical, family history and social history reviewed and updated. Patient Active Problem List   Diagnosis Date Noted  . Elevated  troponin 07/04/2019  . Cocaine use 07/04/2019  . Obesity (BMI 30.0-34.9) 07/04/2019  . Chest pain 03/11/2018  . Substance abuse ([place]) 04/08/2016  . Atypical chest pain 04/07/2016  . CKD (chronic kidney disease) stage 3, GFR 30-59 ml/min (HCC) 04/07/2016  . S/P ICD (internal cardiac defibrillator) procedure 02/07/2015  . NICM (nonischemic cardiomyopathy) ([place]) 12/15/2014  . Chronic systolic CHF (congestive heart failure) ([place]) 08/23/2013  . DENTAL PAIN 04/04/2010  . DYSPNEA 04/04/2010  . LBBB (left bundle branch block) 02/14/2010  . SKIN TAG 02/14/2010  . LEG PAIN 02/14/2010  . Hyperlipidemia 01/07/2010  . ERECTILE DYSFUNCTION 04/09/2009  . 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 Benign essential HTN 01/28/2009  . GERD 01/28/2009  . LOW BACK PAIN 01/28/2009     Current Outpatient Medications on File Prior to Visit  Medication Sig Dispense Refill  . aspirin 81 MG EC tablet Take 1 tablet (81 mg total) by mouth daily. 90 tablet 2  . carvedilol (COREG) 12.5 MG tablet Take 1 tablet (12.5 mg total) by mouth 2 (two) times daily. 180 tablet 3  . omeprazole (PRILOSEC) 20 MG capsule TAKE 1 CAPSULE BY MOUTH EVERY DAY 90 capsule 1  . sacubitril-valsartan (ENTRESTO) 97-103 MG Take 1 tablet by mouth 2 (two) times  daily. 180 tablet 1  . spironolactone (ALDACTONE) 25 MG tablet Take 1 tablet (25 mg total) by mouth daily. 90 tablet 3   No current facility-administered medications on file prior to visit.    No Known Allergies  Social History   Socioeconomic History  . Marital status: Married    Spouse name: Not on file  . Number of children: Not on file  . Years of education: Not on file  . Highest education level: Not on file  Occupational History  . Not on file  Tobacco Use  . Smoking status: Current Some Day Smoker    Packs/day: 0.12    Years: 25.00    Pack years: 3.00    Types: Cigarettes  . Smokeless tobacco: Never Used  Vaping Use  . Vaping Use: Never used    Substance and Sexual Activity  . Alcohol use: Yes    Comment: social  . Drug use: Yes    Types: "Crack" cocaine  . Sexual activity: Yes  Other Topics Concern  . Not on file  Social History Narrative  . Not on file   Social Determinants of Health   Financial Resource Strain:   . Difficulty of Paying Living Expenses: Not on file  Food Insecurity:   . Worried About Charity fundraiser in the Last Year: Not on file  . Ran Out of Food in the Last Year: Not on file  Transportation Needs:   . Lack of Transportation (Medical): Not on file  . Lack of Transportation (Non-Medical): Not on file  Physical Activity:   . Days of Exercise per Week: Not on file  . Minutes of Exercise per Session: Not on file  Stress:   . Feeling of Stress : Not on file  Social Connections:   . Frequency of Communication with Friends and Family: Not on file  . Frequency of Social Gatherings with Friends and Family: Not on file  . Attends Religious Services: Not on file  . Active Member of Clubs or Organizations: Not on file  . Attends Archivist Meetings: Not on file  . Marital Status: Not on file  Intimate Partner Violence:   . Fear of Current or Ex-Partner: Not on file  . Emotionally Abused: Not on file  . Physically Abused: Not on file  . Sexually Abused: Not on file    Family History  Problem Relation Age of Onset  . Heart attack Mother        in 60's  . Heart disease Mother        ischemic  . Congestive Heart Failure Mother   . Diabetes Mother   . Hypertension Mother   . Heart attack Father        in 38's  . Heart disease Father        ischemic  . Heart Problems Father   . Crohn's disease Sister   . Prostate cancer Brother   . HIV/AIDS Brother     Past Surgical History:  Procedure Laterality Date  . BI-VENTRICULAR IMPLANTABLE CARDIOVERTER DEFIBRILLATOR N/A 12/14/2014   with failed LV lead placeemnt  . CARDIAC CATHETERIZATION Left 07/2013   with angiogram  . DEBRIDEMENT  AND CLOSURE WOUND Right 09/20/2015   Procedure: DEBRIDEMENT AND CLOSURE WOUND;  Surgeon: Dayna Barker, MD;  Location: [facility];  Service: Plastics;  Laterality: Right;  . EPICARDIAL PACING LEAD PLACEMENT N/A 02/07/2015   eicardial LV lead placed by Dr Laverta Baltimore  . LEFT HEART CATH AND CORONARY ANGIOGRAPHY N/A  07/05/2019   Procedure: LEFT HEART CATH AND CORONARY ANGIOGRAPHY;  Surgeon: Larey Dresser, MD;  Location: [facility];  Service: Cardiovascular;  Laterality: N/A;  . LEFT HEART CATHETERIZATION WITH CORONARY ANGIOGRAM N/A 08/04/2013   Procedure: LEFT HEART CATHETERIZATION WITH CORONARY ANGIOGRAM;  Surgeon: Larey Dresser, MD;  Location: [facility] CATH LAB;  Service: Cardiovascular;  Laterality: N/A;  . TENDON REPAIR Left 09/20/2015   Procedure: repair nerve tendon wrist;  Surgeon: Dayna Barker, MD;  Location: [facility];  Service: Plastics;  Laterality: Left;  . THORACOTOMY Left 02/07/2015   Procedure: THORACOTOMY MAJOR;  Surgeon: Gaye Pollack, MD;  Location: [facility] OR;  Service: Thoracic;  Laterality: Left;    ROS: Review of Systems Negative except as stated above  PHYSICAL EXAM: BP (!) 145/93   Pulse 84   Temp 98.5 F (36.9 C)   Resp 16   Ht 5\' 8"  (1.727 m)   Wt 230 lb 3.2 oz (104.4 kg)   SpO2 97%   BMI 35.00 kg/m   Physical Exam General appearance - alert, well appearing, middle-age African-American male and in no distress Mental status - normal mood, behavior, speech, dress, motor activity, and thought processes, depressed mood Eyes - pupils equal and reactive, extraocular eye movements intact Neck - supple, no significant adenopathy Chest - clear to auscultation, no wheezes, rales or rhonchi, symmetric air entry Heart - normal rate, regular rhythm, normal S1, S2, no murmurs, rubs, clicks or gallops Rectal -CMA Pollock present.  Prostate feels mildly enlarged.  No nodules felt on the lower half of the prostate. Extremities - peripheral pulses normal, no pedal edema, no clubbing or  cyanosis  CMP Latest Ref Rng & Units 07/03/2020 04/09/2020 07/06/2019  Glucose 70 - 99 mg/dL 130(H) 112(H) 98  BUN 6 - 20 mg/dL 13 19 12   Creatinine 0.61 - 1.24 mg/dL 1.32(H) 1.65(H) 1.52(H)  Sodium 135 - 145 mmol/L 138 142 138  Potassium 3.5 - 5.1 mmol/L 4.0 4.0 3.8  Chloride 98 - 111 mmol/L 108 109 104  CO2 22 - 32 mmol/L 22 23 23   Calcium 8.9 - 10.3 mg/dL 9.0 9.0 8.5(L)  Total Protein 6.5 - 8.1 g/dL - - -  Total Bilirubin 0.3 - 1.2 mg/dL - - -  Alkaline Phos 38 - 126 U/L - - -  AST 15 - 41 U/L - - -  ALT 0 - 44 U/L - - -   Lipid Panel     Component Value Date/Time   CHOL 189 07/04/2019 1137   TRIG 80 07/04/2019 1137   HDL 32 (L) 07/04/2019 1137   CHOLHDL 5.9 07/04/2019 1137   VLDL 16 07/04/2019 1137   LDLCALC 141 (H) 07/04/2019 1137   LDLDIRECT 155.3 04/24/2010 1618    CBC    Component Value Date/Time   WBC 7.0 07/06/2019 0220   RBC 5.50 07/06/2019 0220   HGB 14.6 07/06/2019 0220   HCT 46.2 07/06/2019 0220   PLT 300 07/06/2019 0220   MCV 84.0 07/06/2019 0220   MCH 26.5 07/06/2019 0220   MCHC 31.6 07/06/2019 0220   RDW 14.6 07/06/2019 0220   LYMPHSABS 2.0 07/04/2019 0415   MONOABS 0.7 07/04/2019 0415   EOSABS 0.1 07/04/2019 0415   BASOSABS 0.0 07/04/2019 0415    ASSESSMENT AND PLAN:  1. Encounter to establish care  2. Polyuria Symptoms suggest BPH.  Discussed diagnosis with patient.  I wanted to put him on Flomax but it appears his insurance does not cover this medication so  we will use a low dose of Cardura instead.  Prostate cancer screening with PSA discussed.  Patient is willing to have it done.  Follow-up in 6 weeks to see how he is doing on the Cardura. - PSA; Future - Urinalysis, Routine w reflex microscopic; Future - doxazosin (CARDURA) 1 MG tablet; Take 1 tablet (1 mg total) by mouth daily.  Dispense: 30 tablet; Refill: 3  3. Tobacco dependence Advised to quit.  Discussed health risks associated with smoking.  Patient is wanting to quit.  We discussed  methods to help him quit.  He feels he can quit cold [place].  I have encouraged him to set a quit date.  Less than 5 minutes spent on counseling.  4. Essential hypertension Not at goal but patient forgot to take carvedilol for the past 2 days.  He will resume taking carvedilol with spironolactone  5. NICM (nonischemic cardiomyopathy) (HCC) Stable.  Continue carvedilol, spironolactone and Entresto  6. Class 2 obesity due to excess calories without serious comorbidity with body mass index (BMI) of 35.0 to 35.9 in adult Discussed the importance of healthy eating and regular exercise. - CBC; Future - Comprehensive metabolic panel; Future - Lipid panel; Future - Hemoglobin A1c; Future  7. Screening for colon cancer  - Ambulatory referral to Gastroenterology  8. Influenza vaccination declined This was offered and patient declined today.   Patient was given the opportunity to ask questions.  Patient verbalized understanding of the plan and was able to repeat key elements of the plan.   No orders of the defined types were placed in this encounter.    Requested Prescriptions    No prescriptions requested or ordered in this encounter    No follow-ups on file.  Karle Plumber, MD, FACP

## 2020-08-01 ENCOUNTER — Encounter: Payer: Self-pay | Admitting: Gastroenterology

## 2020-08-09 ENCOUNTER — Ambulatory Visit (HOSPITAL_BASED_OUTPATIENT_CLINIC_OR_DEPARTMENT_OTHER): Payer: Medicaid Other | Attending: Cardiology | Admitting: Cardiology

## 2020-08-16 ENCOUNTER — Telehealth (HOSPITAL_COMMUNITY): Payer: Self-pay | Admitting: Pharmacy Technician

## 2020-08-16 NOTE — Telephone Encounter (Signed)
Patient Advocate Encounter   Received notification from [facility] that prior authorization for Delene Loll is required.   PA submitted on CoverMyMeds Key  [id number] Status is pending   Will continue to follow.

## 2020-08-19 NOTE — Telephone Encounter (Signed)
Advanced Heart Failure Patient Advocate Encounter  Prior Authorization for Delene Loll has been approved.   Effective dates: 08/16/20 through 08/16/21  Charlann Boxer, CPhT

## 2020-08-28 ENCOUNTER — Ambulatory Visit (HOSPITAL_COMMUNITY): Admission: RE | Admit: 2020-08-28 | Payer: Medicaid Other | Source: Ambulatory Visit

## 2020-08-28 ENCOUNTER — Encounter (HOSPITAL_COMMUNITY): Payer: Medicaid Other | Admitting: Cardiology

## 2020-09-13 ENCOUNTER — Telehealth: Payer: Self-pay | Admitting: *Deleted

## 2020-09-13 NOTE — Telephone Encounter (Signed)
John,  Could you please review pts chart and advise if he is appropriate for [facility]?  He is scheduled for a screening colon.  Hx of CAD, CHF and an AICD.  He was scheduled for ECHO 08/28/20 and was a no show.  Previous ECHO in 07/2019 showed ef of 40-45%.  Thank you,  Lanelle Bal

## 2020-09-13 NOTE — Telephone Encounter (Signed)
Natalie,  This pt is cleared for anesthetic care at [facility].  Thanks,  Spruha Weight 

## 2020-09-13 NOTE — Telephone Encounter (Signed)
Thanks John.

## 2020-10-04 ENCOUNTER — Encounter: Payer: Medicaid Other | Admitting: Gastroenterology

## 2020-11-11 ENCOUNTER — Ambulatory Visit (INDEPENDENT_AMBULATORY_CARE_PROVIDER_SITE_OTHER): Payer: Medicaid Other

## 2020-11-11 DIAGNOSIS — I447 Left bundle-branch block, unspecified: Secondary | ICD-10-CM | POA: Diagnosis not present

## 2020-11-11 LAB — CUP PACEART REMOTE DEVICE CHECK
Battery Remaining Longevity: 20 mo
Battery Remaining Percentage: 27 %
Battery Voltage: 2.83 V
Brady Statistic AP VP Percent: 1 %
Brady Statistic AP VS Percent: 1 %
Brady Statistic AS VP Percent: 95 %
Brady Statistic AS VS Percent: 4.2 %
Brady Statistic RA Percent Paced: 1 %
Date Time Interrogation Session: 20211213001010
HighPow Impedance: 69 Ohm
HighPow Impedance: 69 Ohm
Implantable Lead Implant Date: 20160115
Implantable Lead Implant Date: 20160115
Implantable Lead Implant Date: 20160310
Implantable Lead Location: 753858
Implantable Lead Location: 753859
Implantable Lead Location: 753860
Implantable Lead Model: 5076
Implantable Lead Model: 511212
Implantable Lead Serial Number: 247936
Implantable Pulse Generator Implant Date: 20160115
Lead Channel Impedance Value: 350 Ohm
Lead Channel Impedance Value: 440 Ohm
Lead Channel Impedance Value: 450 Ohm
Lead Channel Pacing Threshold Amplitude: 0.625 V
Lead Channel Pacing Threshold Amplitude: 0.75 V
Lead Channel Pacing Threshold Amplitude: 0.875 V
Lead Channel Pacing Threshold Pulse Width: 0.5 ms
Lead Channel Pacing Threshold Pulse Width: 0.5 ms
Lead Channel Pacing Threshold Pulse Width: 0.5 ms
Lead Channel Sensing Intrinsic Amplitude: 12 mV
Lead Channel Sensing Intrinsic Amplitude: 3.9 mV
Lead Channel Setting Pacing Amplitude: 2 V
Lead Channel Setting Pacing Amplitude: 2 V
Lead Channel Setting Pacing Amplitude: 2.5 V
Lead Channel Setting Pacing Pulse Width: 0.5 ms
Lead Channel Setting Pacing Pulse Width: 0.5 ms
Lead Channel Setting Sensing Sensitivity: 0.5 mV
Pulse Gen Serial Number: 7209167

## 2020-11-26 NOTE — Progress Notes (Signed)
Remote ICD transmission.   

## 2021-02-10 ENCOUNTER — Ambulatory Visit (INDEPENDENT_AMBULATORY_CARE_PROVIDER_SITE_OTHER): Payer: Medicaid Other

## 2021-02-10 DIAGNOSIS — I428 Other cardiomyopathies: Secondary | ICD-10-CM

## 2021-02-14 ENCOUNTER — Telehealth: Payer: Self-pay

## 2021-02-14 LAB — CUP PACEART REMOTE DEVICE CHECK
Battery Remaining Longevity: 19 mo
Battery Remaining Percentage: 25 %
Battery Voltage: 2.81 V
Brady Statistic AP VP Percent: 1 %
Brady Statistic AP VS Percent: 1 %
Brady Statistic AS VP Percent: 95 %
Brady Statistic AS VS Percent: 4.5 %
Brady Statistic RA Percent Paced: 1 %
Date Time Interrogation Session: 20220317221938
HighPow Impedance: 61 Ohm
HighPow Impedance: 61 Ohm
Implantable Lead Implant Date: 20160115
Implantable Lead Implant Date: 20160115
Implantable Lead Implant Date: 20160310
Implantable Lead Location: 753858
Implantable Lead Location: 753859
Implantable Lead Location: 753860
Implantable Lead Model: 5076
Implantable Lead Model: 511212
Implantable Lead Serial Number: 247936
Implantable Pulse Generator Implant Date: 20160115
Lead Channel Impedance Value: 350 Ohm
Lead Channel Impedance Value: 450 Ohm
Lead Channel Impedance Value: 480 Ohm
Lead Channel Pacing Threshold Amplitude: 0.625 V
Lead Channel Pacing Threshold Amplitude: 0.75 V
Lead Channel Pacing Threshold Amplitude: 1 V
Lead Channel Pacing Threshold Pulse Width: 0.5 ms
Lead Channel Pacing Threshold Pulse Width: 0.5 ms
Lead Channel Pacing Threshold Pulse Width: 0.5 ms
Lead Channel Sensing Intrinsic Amplitude: 12 mV
Lead Channel Sensing Intrinsic Amplitude: 4.2 mV
Lead Channel Setting Pacing Amplitude: 2 V
Lead Channel Setting Pacing Amplitude: 2 V
Lead Channel Setting Pacing Amplitude: 2.5 V
Lead Channel Setting Pacing Pulse Width: 0.5 ms
Lead Channel Setting Pacing Pulse Width: 0.5 ms
Lead Channel Setting Sensing Sensitivity: 0.5 mV
Pulse Gen Serial Number: 7209167

## 2021-02-14 NOTE — Telephone Encounter (Signed)
Scheduled Merlin transmission received- Corvue impedence below threshold and trending down.    Spoke with pt.  He denies any known edema.  AS for SOB, he has noticed some lately but it has not affected lifestyle.  Educated pt to monitor closely, watch sodium intake, maintain compliance with meds as ordered.  Pt reports he has an appt with Dr. Aundra Dubin on 3/30 which he will be sure to keep and v/u to call if condition changes and a sooner appt is needed.

## 2021-02-19 ENCOUNTER — Other Ambulatory Visit (HOSPITAL_COMMUNITY): Payer: Self-pay | Admitting: Cardiology

## 2021-02-19 ENCOUNTER — Other Ambulatory Visit: Payer: Self-pay | Admitting: Internal Medicine

## 2021-02-19 DIAGNOSIS — R3589 Other polyuria: Secondary | ICD-10-CM

## 2021-02-19 NOTE — Progress Notes (Signed)
Remote ICD transmission.   

## 2021-02-19 NOTE — Telephone Encounter (Signed)
Courtesy refill . Future appt in 2 months

## 2021-02-26 ENCOUNTER — Encounter (HOSPITAL_COMMUNITY): Payer: Medicaid Other | Admitting: Cardiology

## 2021-02-26 ENCOUNTER — Ambulatory Visit (HOSPITAL_COMMUNITY): Payer: Medicaid Other

## 2021-03-03 ENCOUNTER — Other Ambulatory Visit (HOSPITAL_COMMUNITY): Payer: Self-pay | Admitting: Cardiology

## 2021-03-19 ENCOUNTER — Other Ambulatory Visit (HOSPITAL_COMMUNITY): Payer: Self-pay | Admitting: Cardiology

## 2021-03-25 ENCOUNTER — Encounter (HOSPITAL_COMMUNITY): Payer: Medicaid Other | Admitting: Cardiology

## 2021-04-02 ENCOUNTER — Other Ambulatory Visit (HOSPITAL_COMMUNITY): Payer: Self-pay | Admitting: Cardiology

## 2021-04-03 ENCOUNTER — Encounter (HOSPITAL_COMMUNITY): Payer: Medicaid Other | Admitting: Cardiology

## 2021-04-03 ENCOUNTER — Ambulatory Visit (HOSPITAL_COMMUNITY): Admission: RE | Admit: 2021-04-03 | Payer: Medicaid Other | Source: Ambulatory Visit

## 2021-04-21 ENCOUNTER — Ambulatory Visit: Payer: Medicaid Other | Admitting: Internal Medicine

## 2021-05-02 ENCOUNTER — Other Ambulatory Visit (HOSPITAL_COMMUNITY): Payer: Self-pay | Admitting: Cardiology

## 2021-05-12 ENCOUNTER — Ambulatory Visit (HOSPITAL_COMMUNITY)
Admission: EM | Admit: 2021-05-12 | Discharge: 2021-05-12 | Disposition: A | Discharge status: Home / Self Care | Payer: Medicaid Other | Attending: Emergency Medicine | Admitting: Emergency Medicine

## 2021-05-12 ENCOUNTER — Other Ambulatory Visit (HOSPITAL_COMMUNITY): Payer: Self-pay | Admitting: Cardiology

## 2021-05-12 ENCOUNTER — Other Ambulatory Visit: Payer: Self-pay

## 2021-05-12 ENCOUNTER — Encounter (HOSPITAL_COMMUNITY): Payer: Self-pay

## 2021-05-12 DIAGNOSIS — L237 Allergic contact dermatitis due to plants, except food: Secondary | ICD-10-CM

## 2021-05-12 MED ORDER — PREDNISONE 20 MG PO TABS: 20.0000 mg | ORAL_TABLET | ORAL | 0 refills | Status: DC

## 2021-05-12 MED ORDER — TRIAMCINOLONE ACETONIDE 0.1 % EX CREA: 1.0000 "application " | TOPICAL_CREAM | Freq: Four times a day (QID) | CUTANEOUS | 1 refills | Status: DC

## 2021-05-12 NOTE — ED Provider Notes (Signed)
[facility]  ____________________________________________  Time seen: Approximately 12:13 PM  I have reviewed the triage vital signs and the nursing notes.   HISTORY  Chief Complaint Rash    HPI Roger Crosby is a 60 y.o. male who presents emergency department complaining of rash to bilateral forearms.  Patient states that he came in contact with poison oak.  He states that typically when he is working in his yard this time a year he will typically accidentally be exposed.  Patient is requesting steroids for symptom relief.  He is used over-the-counter medication with no relief of symptoms.  No other complaints at this time.       Past Medical History:  Diagnosis Date   AICD (automatic cardioverter/defibrillator) present    a.  Unsuccessful LV lead placement 11/2014   CAD (coronary artery disease)    a. nonobstructive by [facility] 6/11: oOM1 40%, mild luminal irregs elsewhere b. cath 09/2014L no obstructive disease c. 03/2016: Low-risk NST   CHF (congestive heart failure) (HCC)    CKD (chronic kidney disease)    GERD (gastroesophageal reflux disease)    High cholesterol    HTN (hypertension)    Hx of echocardiogram    echo 5/11: EF 45-50%, mild HK of Ap AS wall, ap Ant wall and true apex (LAD distribution), mild LAE   LBBB (left bundle branch block)    LBP (low back pain)    NICM (nonischemic cardiomyopathy) ([place])    Systolic heart failure ([place])     Patient Active Problem List   Diagnosis Date Noted   Class 2 obesity due to excess calories without serious comorbidity with body mass index (BMI) of 35.0 to 35.9 in adult 07/26/2020   Tobacco dependence 07/26/2020   Polyuria 07/26/2020   Elevated troponin 07/04/2019   Cocaine use 07/04/2019   Obesity (BMI 30.0-34.9) 07/04/2019   Chest pain 03/11/2018   Substance abuse ([place]) 04/08/2016   Atypical chest pain 04/07/2016   CKD (chronic kidney disease) stage 3, GFR 30-59 ml/min (HCC) 04/07/2016   S/P ICD (internal  cardiac defibrillator) procedure 02/07/2015   NICM (nonischemic cardiomyopathy) ([place]) 09/73/5329   Chronic systolic CHF (congestive heart failure) ([place]) 08/23/2013   DENTAL PAIN 04/04/2010   DYSPNEA 04/04/2010   LBBB (left bundle branch block) 02/14/2010   SKIN TAG 02/14/2010   LEG PAIN 02/14/2010   Hyperlipidemia 01/07/2010   ERECTILE DYSFUNCTION 04/09/2009   NODULAR PROSTATE WITHOUT URINARY OBSTRUCTION 04/09/2009   Essential hypertension 01/28/2009   GERD 01/28/2009   LOW BACK PAIN 01/28/2009    Past Surgical History:  Procedure Laterality Date   BI-VENTRICULAR IMPLANTABLE CARDIOVERTER DEFIBRILLATOR N/A 12/14/2014   with failed LV lead placeemnt   CARDIAC CATHETERIZATION Left 07/2013   with angiogram   DEBRIDEMENT AND CLOSURE WOUND Right 09/20/2015   Procedure: DEBRIDEMENT AND CLOSURE WOUND;  Surgeon: Dayna Barker, MD;  Location: [facility];  Service: Plastics;  Laterality: Right;   EPICARDIAL PACING LEAD PLACEMENT N/A 02/07/2015   eicardial LV lead placed by Dr Laverta Baltimore   LEFT HEART CATH AND CORONARY ANGIOGRAPHY N/A 07/05/2019   Procedure: LEFT HEART CATH AND CORONARY ANGIOGRAPHY;  Surgeon: Larey Dresser, MD;  Location: [facility];  Service: Cardiovascular;  Laterality: N/A;   LEFT HEART CATHETERIZATION WITH CORONARY ANGIOGRAM N/A 08/04/2013   Procedure: LEFT HEART CATHETERIZATION WITH CORONARY ANGIOGRAM;  Surgeon: Larey Dresser, MD;  Location: [facility] CATH LAB;  Service: Cardiovascular;  Laterality: N/A;   TENDON REPAIR Left 09/20/2015   Procedure: repair  nerve tendon wrist;  Surgeon: Dayna Barker, MD;  Location: [facility];  Service: Plastics;  Laterality: Left;   THORACOTOMY Left 02/07/2015   Procedure: THORACOTOMY MAJOR;  Surgeon: Gaye Pollack, MD;  Location: [facility];  Service: Thoracic;  Laterality: Left;    Prior to Admission medications   Medication Sig Start Date End Date Taking? Authorizing Provider  predniSONE (DELTASONE) 20 MG tablet Take 1 tablet (20 mg total) by mouth as  directed. 05/12/21  Yes Rosco Harriott, Charline Bills, PA-C  triamcinolone cream (KENALOG) 0.1 % Apply 1 application topically 4 (four) times daily. 05/12/21  Yes Kyna Blahnik, Charline Bills, PA-C  aspirin 81 MG EC tablet Take 1 tablet (81 mg total) by mouth daily. 08/02/19   Larey Dresser, MD  carvedilol (COREG) 12.5 MG tablet TAKE 1 TABLET BY MOUTH TWICE A DAY 04/03/21   Larey Dresser, MD  doxazosin (CARDURA) 1 MG tablet TAKE 1 TABLET BY MOUTH EVERY DAY 02/19/21   Ladell Pier, MD  omeprazole (PRILOSEC) 20 MG capsule TAKE 1 CAPSULE BY MOUTH EVERY DAY 05/15/20   Larey Dresser, MD  sacubitril-valsartan (ENTRESTO) 97-103 MG Take 1 tablet by mouth 2 (two) times daily. 07/04/20   Larey Dresser, MD  spironolactone (ALDACTONE) 25 MG tablet Take 1 tablet (25 mg total) by mouth daily. 07/03/20   Larey Dresser, MD    Allergies Patient has no known allergies.  Family History  Problem Relation Age of Onset   Heart attack Mother        in 41's   Heart disease Mother        ischemic   Congestive Heart Failure Mother    Diabetes Mother    Hypertension Mother    Heart attack Father        in 9's   Heart disease Father        ischemic   Heart Problems Father    Crohn's disease Sister    Prostate cancer Brother    HIV/AIDS Brother     Social History Social History   Tobacco Use   Smoking status: Some Days    Packs/day: 0.12    Years: 25.00    Pack years: 3.00    Types: Cigarettes   Smokeless tobacco: Never  Vaping Use   Vaping Use: Never used  Substance Use Topics   Alcohol use: Yes    Comment: occasionally   Drug use: Not Currently    Types: "Crack" cocaine    Comment: clean x 6 mths     Review of Systems  Constitutional: No fever/chills Eyes: No visual changes. No discharge ENT: No upper respiratory complaints. Cardiovascular: no chest pain. Respiratory: no cough. No SOB. Gastrointestinal: No abdominal pain.  No nausea, no vomiting.  No diarrhea.  No  constipation. Musculoskeletal: Negative for musculoskeletal pain. Skin: Positive for rash to bilateral forearms Neurological: Negative for headaches, focal weakness or numbness.  10 System ROS otherwise negative.  ____________________________________________   PHYSICAL EXAM:  VITAL SIGNS: ED Triage Vitals  Enc Vitals Group     BP 05/12/21 1141 135/84     Pulse Rate 05/12/21 1141 70     Resp 05/12/21 1141 18     Temp 05/12/21 1141 98.7 F (37.1 C)     Temp Source 05/12/21 1141 Oral     SpO2 05/12/21 1141 100 %     Weight --      Height --      Head Circumference --  Peak Flow --      Pain Score 05/12/21 1140 0     Pain Loc --      Pain Edu? --      Excl. in [place]? --      Constitutional: Alert and oriented. Well appearing and in no acute distress. Eyes: Conjunctivae are normal. PERRL. EOMI. Head: Atraumatic. ENT:      Ears:       Nose: No congestion/rhinnorhea.      Mouth/Throat: Mucous membranes are moist.  Neck: No stridor.    Cardiovascular: Normal rate, regular rhythm. Normal S1 and S2.  Good peripheral circulation. Respiratory: Normal respiratory effort without tachypnea or retractions. Lungs CTAB. Good air entry to the bases with no decreased or absent breath sounds. Musculoskeletal: Full range of motion to all extremities. No gross deformities appreciated. Neurologic:  Normal speech and language. No gross focal neurologic deficits are appreciated.  Skin:  Skin is warm, dry and intact.  Rash to forearms with both macules as well as small vesicles.  There are multiple excoriations from scratching.  Rash is consistent with reported contact with poison oak. Psychiatric: Mood and affect are normal. Speech and behavior are normal. Patient exhibits appropriate insight and judgement.   ____________________________________________   LABS (all labs ordered are listed, but only abnormal results are displayed)  Labs Reviewed - No data to  display ____________________________________________  EKG   ____________________________________________  RADIOLOGY   No results found.  ____________________________________________    PROCEDURES  Procedure(s) performed:    Procedures    Medications - No data to display   ____________________________________________   INITIAL IMPRESSION / ASSESSMENT AND PLAN / ED COURSE  Pertinent labs & imaging results that were available during my care of the patient were reviewed by me and considered in my medical decision making (see chart for details).  Review of the [place] CSRS was performed in accordance of the [place] prior to dispensing any controlled drugs.           Patient's diagnosis is consistent with poison oak dermatitis.  Patient presented to emergency department bilateral forearm rash after coming in contact with poison oak.  Findings are consistent with dermatitis likely secondary to poison ivy/poison oak.  Patient is offered shot versus pills and patient would prefer oral prednisone at this time.  I will prescribe topical triamcinolone for additional symptom control.  Follow-up with her primary care as needed..  Patient is given ED precautions to return to the ED for any worsening or new symptoms.     ____________________________________________  FINAL CLINICAL IMPRESSION(S) / DIAGNOSES  Final diagnoses:  Poison oak dermatitis      NEW MEDICATIONS STARTED DURING THIS VISIT:  ED Discharge Orders          Ordered    predniSONE (DELTASONE) 20 MG tablet  As directed       Note to Pharmacy: Take 60mg  daily x 7 days, 40 mg daily x 7 days, and 20mg  daily x 7days   05/12/21 1224    triamcinolone cream (KENALOG) 0.1 %  4 times daily        05/12/21 1224                This chart was dictated using voice recognition software/Dragon. Despite best efforts to proofread, errors can occur which can change the meaning. Any change was purely  unintentional.    Darletta Moll, PA-C 05/12/21 1225

## 2021-05-12 NOTE — ED Triage Notes (Signed)
Pt reports rash in arms x 3 days. States he was in contact with poison oak.

## 2021-06-09 ENCOUNTER — Other Ambulatory Visit (HOSPITAL_COMMUNITY): Payer: Self-pay | Admitting: *Deleted

## 2021-06-09 DIAGNOSIS — I5022 Chronic systolic (congestive) heart failure: Secondary | ICD-10-CM

## 2021-06-12 ENCOUNTER — Ambulatory Visit (HOSPITAL_COMMUNITY): Admission: RE | Admit: 2021-06-12 | Payer: Medicaid Other | Source: Ambulatory Visit

## 2021-06-12 ENCOUNTER — Encounter (HOSPITAL_COMMUNITY): Payer: Medicaid Other | Admitting: Cardiology

## 2021-07-21 ENCOUNTER — Other Ambulatory Visit (HOSPITAL_COMMUNITY): Payer: Self-pay

## 2021-07-21 ENCOUNTER — Telehealth (HOSPITAL_COMMUNITY): Payer: Self-pay | Admitting: Pharmacist

## 2021-07-21 NOTE — Telephone Encounter (Signed)
Advanced Heart Failure Patient Advocate Encounter  Prior Authorization for Delene Loll has been approved.    PA# [id number] Effective dates: 07/21/21 through 07/21/22  Audry Riles, PharmD, BCPS, BCCP, CPP Heart Failure Clinic Pharmacist [phone]

## 2021-08-11 ENCOUNTER — Ambulatory Visit (INDEPENDENT_AMBULATORY_CARE_PROVIDER_SITE_OTHER): Payer: Medicaid Other

## 2021-08-11 DIAGNOSIS — I447 Left bundle-branch block, unspecified: Secondary | ICD-10-CM

## 2021-08-12 LAB — CUP PACEART REMOTE DEVICE CHECK
Battery Remaining Longevity: 14 mo
Battery Remaining Percentage: 18 %
Battery Voltage: 2.75 V
Brady Statistic AP VP Percent: 1 %
Brady Statistic AP VS Percent: 1 %
Brady Statistic AS VP Percent: 95 %
Brady Statistic AS VS Percent: 4.7 %
Brady Statistic RA Percent Paced: 1 %
Date Time Interrogation Session: 20220913152832
HighPow Impedance: 63 Ohm
HighPow Impedance: 63 Ohm
Implantable Lead Implant Date: 20160115
Implantable Lead Implant Date: 20160115
Implantable Lead Implant Date: 20160310
Implantable Lead Location: 753858
Implantable Lead Location: 753859
Implantable Lead Location: 753860
Implantable Lead Model: 5076
Implantable Lead Model: 511212
Implantable Lead Serial Number: 247936
Implantable Pulse Generator Implant Date: 20160115
Lead Channel Impedance Value: 350 Ohm
Lead Channel Impedance Value: 440 Ohm
Lead Channel Impedance Value: 530 Ohm
Lead Channel Pacing Threshold Amplitude: 0.625 V
Lead Channel Pacing Threshold Amplitude: 0.75 V
Lead Channel Pacing Threshold Amplitude: 1 V
Lead Channel Pacing Threshold Pulse Width: 0.5 ms
Lead Channel Pacing Threshold Pulse Width: 0.5 ms
Lead Channel Pacing Threshold Pulse Width: 0.5 ms
Lead Channel Sensing Intrinsic Amplitude: 12 mV
Lead Channel Sensing Intrinsic Amplitude: 2.9 mV
Lead Channel Setting Pacing Amplitude: 2 V
Lead Channel Setting Pacing Amplitude: 2 V
Lead Channel Setting Pacing Amplitude: 2.5 V
Lead Channel Setting Pacing Pulse Width: 0.5 ms
Lead Channel Setting Pacing Pulse Width: 0.5 ms
Lead Channel Setting Sensing Sensitivity: 0.5 mV
Pulse Gen Serial Number: 7209167

## 2021-08-18 ENCOUNTER — Other Ambulatory Visit (HOSPITAL_COMMUNITY): Payer: Self-pay | Admitting: Cardiology

## 2021-08-18 ENCOUNTER — Other Ambulatory Visit: Payer: Self-pay | Admitting: Internal Medicine

## 2021-08-18 DIAGNOSIS — R3589 Other polyuria: Secondary | ICD-10-CM

## 2021-08-18 NOTE — Telephone Encounter (Signed)
Requested medications are due for refill today yes  Requested medications are on the active medication list yes  Last refill 02/19/21  Last visit 06/2020  Future visit scheduled No, has had some no shows  Notes to clinic No PCP listed, is this pt still associated with your practice?

## 2021-08-19 NOTE — Progress Notes (Signed)
Remote ICD transmission.   

## 2021-09-02 ENCOUNTER — Ambulatory Visit (HOSPITAL_COMMUNITY): Admission: RE | Admit: 2021-09-02 | Payer: Medicaid Other | Source: Ambulatory Visit

## 2021-09-02 ENCOUNTER — Encounter (HOSPITAL_COMMUNITY): Payer: Medicaid Other | Admitting: Cardiology

## 2021-09-28 NOTE — Progress Notes (Deleted)
Cardiology Office Note Date:  09/28/2021  Patient ID:  Roger Crosby, DOB 03/14/1961, MRN [id number] PCP:  Patient, No Pcp Per (Inactive)  Cardiologist:  Dr. Aundra Dubin Electrophysiologist: ***  ***refresh   Chief Complaint: *** SVT  History of Present Illness: Roger Crosby is a 60 y.o. male with history of LBBB, NICM, CRT-D, HTN, GERD, CKD (***)  He most recently saw Dr. Aundra Dubin May 2021, at that time discussed intermittent LTFU 2/2 prison and poor follow up, + cocaine at one time as well spending time in rehab Was at that tie without recurrent drug use. BP was elevated, reported compliance with medicines. Coreg increased, aladactone added Recommended visit with Dr. Radford Pax for CPAP titration.   Last EP visit was with Dr. Caryl Comes back in 02/07/2015 as far as I can find  *** remotes *** symptoms *** labs, lipids *** meds, CM *** needs to see SK next *** echo 2020 with abb septa; motion, conside CRT opt, EF 40-45%    Device information SJM CRT-D implanted 12/14/2014, LV lead was added 01/2014   Past Medical History:  Diagnosis Date   AICD (automatic cardioverter/defibrillator) present    a.  Unsuccessful LV lead placement 11/2014   CAD (coronary artery disease)    a. nonobstructive by [facility] 6/11: oOM1 40%, mild luminal irregs elsewhere b. cath 09/2014L no obstructive disease c. 03/2016: Low-risk NST   CHF (congestive heart failure) (HCC)    CKD (chronic kidney disease)    GERD (gastroesophageal reflux disease)    High cholesterol    HTN (hypertension)    Hx of echocardiogram    echo 5/11: EF 45-50%, mild HK of Ap AS wall, ap Ant wall and true apex (LAD distribution), mild LAE   LBBB (left bundle branch block)    LBP (low back pain)    NICM (nonischemic cardiomyopathy) ([place])    Systolic heart failure ([place])     Past Surgical History:  Procedure Laterality Date   BI-VENTRICULAR IMPLANTABLE CARDIOVERTER DEFIBRILLATOR N/A 12/14/2014   with failed LV lead placeemnt   CARDIAC  CATHETERIZATION Left 07/2013   with angiogram   DEBRIDEMENT AND CLOSURE WOUND Right 09/20/2015   Procedure: DEBRIDEMENT AND CLOSURE WOUND;  Surgeon: Dayna Barker, MD;  Location: [facility];  Service: Plastics;  Laterality: Right;   EPICARDIAL PACING LEAD PLACEMENT N/A 02/07/2015   eicardial LV lead placed by Dr Laverta Baltimore   LEFT HEART CATH AND CORONARY ANGIOGRAPHY N/A 07/05/2019   Procedure: LEFT HEART CATH AND CORONARY ANGIOGRAPHY;  Surgeon: Larey Dresser, MD;  Location: [facility];  Service: Cardiovascular;  Laterality: N/A;   LEFT HEART CATHETERIZATION WITH CORONARY ANGIOGRAM N/A 08/04/2013   Procedure: LEFT HEART CATHETERIZATION WITH CORONARY ANGIOGRAM;  Surgeon: Larey Dresser, MD;  Location: [facility] CATH LAB;  Service: Cardiovascular;  Laterality: N/A;   TENDON REPAIR Left 09/20/2015   Procedure: repair nerve tendon wrist;  Surgeon: Dayna Barker, MD;  Location: [facility];  Service: Plastics;  Laterality: Left;   THORACOTOMY Left 02/07/2015   Procedure: THORACOTOMY MAJOR;  Surgeon: Gaye Pollack, MD;  Location: [facility];  Service: Thoracic;  Laterality: Left;    Current Outpatient Medications  Medication Sig Dispense Refill   aspirin 81 MG EC tablet Take 1 tablet (81 mg total) by mouth daily. 90 tablet 2   carvedilol (COREG) 12.5 MG tablet TAKE 1 TABLET BY MOUTH TWICE A DAY 180 tablet 1   doxazosin (CARDURA) 1 MG tablet TAKE 1 TABLET BY MOUTH EVERY DAY 90 tablet 0  ENTRESTO 97-103 MG TAKE 1 TABLET BY MOUTH TWICE A DAY 180 tablet 1   omeprazole (PRILOSEC) 20 MG capsule TAKE 1 CAPSULE BY MOUTH EVERY DAY 90 capsule 1   predniSONE (DELTASONE) 20 MG tablet Take 1 tablet (20 mg total) by mouth as directed. 42 tablet 0   spironolactone (ALDACTONE) 25 MG tablet TAKE 1 TABLET BY MOUTH EVERY DAY 90 tablet 3   triamcinolone cream (KENALOG) 0.1 % Apply 1 application topically 4 (four) times daily. 30 g 1   No current facility-administered medications for this visit.    Allergies:   Patient has no known  allergies.   Social History:  The patient  reports that he has been smoking cigarettes. He has a 3.00 pack-year smoking history. He has never used smokeless tobacco. He reports current alcohol use. He reports that he does not currently use drugs after having used the following drugs: "Crack" cocaine.   Family History:  The patient's family history includes Congestive Heart Failure in his mother; Crohn's disease in his sister; Diabetes in his mother; HIV/AIDS in his brother; Heart Problems in his father; Heart attack in his father and mother; Heart disease in his father and mother; Hypertension in his mother; Prostate cancer in his brother.  ROS:  Please see the history of present illness.    All other systems are reviewed and otherwise negative.   PHYSICAL EXAM:  VS:  There were no vitals taken for this visit. BMI: There is no height or weight on file to calculate BMI. Well nourished, well developed, in no acute distress HEENT: normocephalic, atraumatic Neck: no JVD, carotid bruits or masses Cardiac:  *** RRR; no significant murmurs, no rubs, or gallops Lungs:  *** CTA b/l, no wheezing, rhonchi or rales Abd: soft, nontender MS: no deformity or *** atrophy Ext: *** no edema Skin: warm and dry, no rash Neuro:  No gross deficits appreciated Psych: euthymic mood, full affect  ***ICD site is stable, no tethering or discomfort   EKG:  Done today and reviewed by myself shows  ***  Device interrogation done today and reviewed by myself:  ***   07/05/2019: LHC 1. Anomalous LCx off proximal RCA.  2. No significant coronary disease.    Suspect noncardiac chest pain (it started prior to him using cocaine).    07/04/2019: TTE IMPRESSIONS   1. The left ventricle has mild-moderately reduced systolic function, with  an ejection fraction of 40-45%. The cavity size was normal. There is mild  concentric left ventricular hypertrophy. Left ventricular diastolic  Doppler parameters are consistent   with impaired relaxation. There is abnormal septal motion consistent with  left bundle branch block. Left ventrical global hypokinesis without  regional wall motion abnormalities.   2. Left atrial size was mildly dilated.   3. Right atrial size was mildly dilated.   4. The aorta is normal in size and structure.   5. When compared to the prior study: 03/11/2018, there appears to be loss  of LV synchrony and decreased left ventricular systolic function. Consider  re-evaluation of CRT.   Recent Labs: No results found for requested labs within last 8760 hours.  No results found for requested labs within last 8760 hours.   CrCl cannot be calculated (Patient's most recent lab result is older than the maximum 21 days allowed.).   Wt Readings from Last 3 Encounters:  07/26/20 230 lb 3.2 oz (104.4 kg)  07/03/20 228 lb 9.6 oz (103.7 kg)  05/29/20 230 lb (104.3 kg)  Other studies reviewed: Additional studies/records reviewed today include: summarized above  ASSESSMENT AND PLAN:  CRT-D ***  NICM Chronic CHF ***  HTN ***  Disposition: F/u with ***  Current medicines are reviewed at length with the patient today.  The patient did not have any concerns regarding medicines.  Venetia Night, PA-C 09/28/2021 10:28 AM     [address] [place] [address] [phone] (office)  [phone] (fax)

## 2021-09-30 ENCOUNTER — Encounter: Payer: Medicaid Other | Admitting: Physician Assistant

## 2021-10-06 ENCOUNTER — Ambulatory Visit
Admission: EM | Admit: 2021-10-06 | Discharge: 2021-10-06 | Discharge status: Absent without leave | Payer: Medicaid Other

## 2021-10-06 ENCOUNTER — Other Ambulatory Visit: Payer: Self-pay

## 2021-10-08 ENCOUNTER — Ambulatory Visit (INDEPENDENT_AMBULATORY_CARE_PROVIDER_SITE_OTHER): Payer: Medicaid Other

## 2021-10-08 ENCOUNTER — Encounter: Payer: Self-pay | Admitting: Emergency Medicine

## 2021-10-08 ENCOUNTER — Ambulatory Visit
Admission: EM | Admit: 2021-10-08 | Discharge: 2021-10-08 | Disposition: A | Discharge status: Home / Self Care | Payer: Medicaid Other | Attending: Internal Medicine | Admitting: Internal Medicine

## 2021-10-08 ENCOUNTER — Other Ambulatory Visit: Payer: Self-pay

## 2021-10-08 DIAGNOSIS — M25511 Pain in right shoulder: Secondary | ICD-10-CM

## 2021-10-08 MED ORDER — METHYLPREDNISOLONE SODIUM SUCC 125 MG IJ SOLR
60.0000 mg | Freq: Once | INTRAMUSCULAR | Status: AC
  Administered 2021-10-08: 60 mg via INTRAMUSCULAR

## 2021-10-08 NOTE — ED Triage Notes (Addendum)
Hurt his right arm years ago, has occasionally experienced pain ever since. C/o worsening right shoulder pain over the last two weeks, had to get off work early today due to pain. Denies any recent injury to area. Requesting x-ray of right shoulder.

## 2021-10-08 NOTE — Discharge Instructions (Signed)
Your x-ray shows chronic degenerative changes of your shoulder.  You were given a steroid injection in urgent care today.  Please follow-up with provided contact information for orthopedist for further evaluation and management.

## 2021-10-08 NOTE — ED Provider Notes (Signed)
[facility]    CSN: [id number] Arrival date & time: 10/08/21  1643      History   Chief Complaint Chief Complaint  Patient presents with   Arm Injury    HPI Roger Crosby is a 60 y.o. male.   Patient presents with right shoulder pain that has been present intermittently over the past few years but has worsened over the past few days.  Patient reports that he had a shoulder injury when he was younger to that shoulder.  Patient has taken ibuprofen and Tylenol with no improvement in symptoms.  Has also used ice and heat application with some improvement.  Patient reports that he does have some tingling that radiates down right arm.  Denies chest pain or shortness of breath.   Arm Injury  Past Medical History:  Diagnosis Date   AICD (automatic cardioverter/defibrillator) present    a.  Unsuccessful LV lead placement 11/2014   CAD (coronary artery disease)    a. nonobstructive by [facility] 6/11: oOM1 40%, mild luminal irregs elsewhere b. cath 09/2014L no obstructive disease c. 03/2016: Low-risk NST   CHF (congestive heart failure) (HCC)    CKD (chronic kidney disease)    GERD (gastroesophageal reflux disease)    High cholesterol    HTN (hypertension)    Hx of echocardiogram    echo 5/11: EF 45-50%, mild HK of Ap AS wall, ap Ant wall and true apex (LAD distribution), mild LAE   LBBB (left bundle branch block)    LBP (low back pain)    NICM (nonischemic cardiomyopathy) ([place])    Systolic heart failure ([place])     Patient Active Problem List   Diagnosis Date Noted   Class 2 obesity due to excess calories without serious comorbidity with body mass index (BMI) of 35.0 to 35.9 in adult 07/26/2020   Tobacco dependence 07/26/2020   Polyuria 07/26/2020   Elevated troponin 07/04/2019   Cocaine use 07/04/2019   Obesity (BMI 30.0-34.9) 07/04/2019   Chest pain 03/11/2018   Substance abuse ([place]) 04/08/2016   Atypical chest pain 04/07/2016   CKD (chronic kidney disease) stage 3,  GFR 30-59 ml/min (HCC) 04/07/2016   S/P ICD (internal cardiac defibrillator) procedure 02/07/2015   NICM (nonischemic cardiomyopathy) ([place]) 92/09/9416   Chronic systolic CHF (congestive heart failure) ([place]) 08/23/2013   DENTAL PAIN 04/04/2010   DYSPNEA 04/04/2010   LBBB (left bundle branch block) 02/14/2010   SKIN TAG 02/14/2010   LEG PAIN 02/14/2010   Hyperlipidemia 01/07/2010   ERECTILE DYSFUNCTION 04/09/2009   NODULAR PROSTATE WITHOUT URINARY OBSTRUCTION 04/09/2009   Essential hypertension 01/28/2009   GERD 01/28/2009   LOW BACK PAIN 01/28/2009    Past Surgical History:  Procedure Laterality Date   BI-VENTRICULAR IMPLANTABLE CARDIOVERTER DEFIBRILLATOR N/A 12/14/2014   with failed LV lead placeemnt   CARDIAC CATHETERIZATION Left 07/2013   with angiogram   DEBRIDEMENT AND CLOSURE WOUND Right 09/20/2015   Procedure: DEBRIDEMENT AND CLOSURE WOUND;  Surgeon: Dayna Barker, MD;  Location: [facility];  Service: Plastics;  Laterality: Right;   EPICARDIAL PACING LEAD PLACEMENT N/A 02/07/2015   eicardial LV lead placed by Dr Laverta Baltimore   LEFT HEART CATH AND CORONARY ANGIOGRAPHY N/A 07/05/2019   Procedure: LEFT HEART CATH AND CORONARY ANGIOGRAPHY;  Surgeon: Larey Dresser, MD;  Location: [facility];  Service: Cardiovascular;  Laterality: N/A;   LEFT HEART CATHETERIZATION WITH CORONARY ANGIOGRAM N/A 08/04/2013   Procedure: LEFT HEART CATHETERIZATION WITH CORONARY ANGIOGRAM;  Surgeon: Larey Dresser, MD;  Location: [facility];  Service: Cardiovascular;  Laterality: N/A;   TENDON REPAIR Left 09/20/2015   Procedure: repair nerve tendon wrist;  Surgeon: Dayna Barker, MD;  Location: [facility];  Service: Plastics;  Laterality: Left;   THORACOTOMY Left 02/07/2015   Procedure: THORACOTOMY MAJOR;  Surgeon: Gaye Pollack, MD;  Location[facility];  Service: Thoracic;  Laterality: Left;       Home Medications    Prior to Admission medications   Medication Sig Start Date End Date Taking? Authorizing  Provider  aspirin 81 MG EC tablet Take 1 tablet (81 mg total) by mouth daily. 08/02/19   Larey Dresser, MD  carvedilol (COREG) 12.5 MG tablet TAKE 1 TABLET BY MOUTH TWICE A DAY 04/03/21   Larey Dresser, MD  doxazosin (CARDURA) 1 MG tablet TAKE 1 TABLET BY MOUTH EVERY DAY 02/19/21   Ladell Pier, MD  ENTRESTO 97-103 MG TAKE 1 TABLET BY MOUTH TWICE A DAY 08/25/21   Larey Dresser, MD  omeprazole (PRILOSEC) 20 MG capsule TAKE 1 CAPSULE BY MOUTH EVERY DAY 05/14/21   Larey Dresser, MD  predniSONE (DELTASONE) 20 MG tablet Take 1 tablet (20 mg total) by mouth as directed. 05/12/21   Cuthriell, Charline Bills, PA-C  spironolactone (ALDACTONE) 25 MG tablet TAKE 1 TABLET BY MOUTH EVERY DAY 08/25/21   Larey Dresser, MD  triamcinolone cream (KENALOG) 0.1 % Apply 1 application topically 4 (four) times daily. 05/12/21   Cuthriell, Charline Bills, PA-C    Family History Family History  Problem Relation Age of Onset   Heart attack Mother        in 1's   Heart disease Mother        ischemic   Congestive Heart Failure Mother    Diabetes Mother    Hypertension Mother    Heart attack Father        in 56's   Heart disease Father        ischemic   Heart Problems Father    Crohn's disease Sister    Prostate cancer Brother    HIV/AIDS Brother     Social History Social History   Tobacco Use   Smoking status: Some Days    Packs/day: 0.12    Years: 25.00    Pack years: 3.00    Types: Cigarettes   Smokeless tobacco: Never  Vaping Use   Vaping Use: Never used  Substance Use Topics   Alcohol use: Yes    Comment: occasionally   Drug use: Not Currently    Types: "Crack" cocaine    Comment: clean x 6 mths     Allergies   Patient has no known allergies.   Review of Systems Review of Systems Per HPI  Physical Exam Triage Vital Signs ED Triage Vitals [10/08/21 1814]  Enc Vitals Group     BP 128/75     Pulse Rate 95     Resp 16     Temp 98.5 F (36.9 C)     Temp Source Oral      SpO2 95 %     Weight      Height      Head Circumference      Peak Flow      Pain Score 10     Pain Loc      Pain Edu?      Excl. in [place]?    No data found.  Updated Vital Signs BP 128/75 (BP Location: Left Arm)  Pulse 95   Temp 98.5 F (36.9 C) (Oral)   Resp 16   SpO2 95%   Visual Acuity Right Eye Distance:   Left Eye Distance:   Bilateral Distance:    Right Eye Near:   Left Eye Near:    Bilateral Near:     Physical Exam Constitutional:      General: He is not in acute distress.    Appearance: Normal appearance. He is not toxic-appearing or diaphoretic.  HENT:     Head: Normocephalic and atraumatic.  Eyes:     Extraocular Movements: Extraocular movements intact.     Conjunctiva/sclera: Conjunctivae normal.  Pulmonary:     Effort: Pulmonary effort is normal.  Musculoskeletal:     Right shoulder: Tenderness present. No swelling, deformity, bony tenderness or crepitus. Normal range of motion. Normal strength. Normal pulse.     Left shoulder: Normal.       Arms:     Comments: Tenderness to palpation to circled area on diagram.  Neurovascular intact.  Neurological:     General: No focal deficit present.     Mental Status: He is alert and oriented to person, place, and time. Mental status is at baseline.  Psychiatric:        Mood and Affect: Mood normal.        Behavior: Behavior normal.        Thought Content: Thought content normal.        Judgment: Judgment normal.     UC Treatments / Results  Labs (all labs ordered are listed, but only abnormal results are displayed) Labs Reviewed - No data to display  EKG   Radiology DG Shoulder Right  Result Date: 10/08/2021 CLINICAL DATA:  A 60 year old male presents with RIGHT shoulder pain. EXAM: RIGHT SHOULDER - 2+ VIEW COMPARISON:  Chest x-ray from 2020. FINDINGS: Degenerative changes in the glenohumeral and acromioclavicular joints. No signs of fracture or of dislocation. Soft tissues are unremarkable.  IMPRESSION: Degenerative changes in the glenohumeral and acromioclavicular joints. No acute bony finding. Electronically Signed   By: Zetta Bills M.D.   On: 10/08/2021 18:56    Procedures Procedures (including critical care time)  Medications Ordered in UC Medications  methylPREDNISolone sodium succinate (SOLU-MEDROL) 125 mg/2 mL injection 60 mg (has no administration in time range)    Initial Impression / Assessment and Plan / UC Course  I have reviewed the triage vital signs and the nursing notes.  Pertinent labs & imaging results that were available during my care of the patient were reviewed by me and considered in my medical decision making (see chart for details).     Right shoulder x-ray showing degenerative changes.  Pain is refractory to over-the-counter medications.  Patient was offered prednisone steroid pills but preferred steroid injection.  60 mg Solu-Medrol administered in urgent care today.  Patient was advised that he will need to follow-up with orthopedist for further evaluation and management of pain.  Provided patient with contact information.  No red flags seen on exam.  Discussed strict return precautions.  Patient verbalized understanding and was agreeable with plan. Final Clinical Impressions(s) / UC Diagnoses   Final diagnoses:  Acute pain of right shoulder     Discharge Instructions      Your x-ray shows chronic degenerative changes of your shoulder.  You were given a steroid injection in urgent care today.  Please follow-up with provided contact information for orthopedist for further evaluation and management.     ED Prescriptions  None    PDMP not reviewed this encounter.   Teodora Medici, [place] 10/08/21 [phone]

## 2021-10-20 ENCOUNTER — Ambulatory Visit (HOSPITAL_COMMUNITY)
Admission: RE | Admit: 2021-10-20 | Discharge: 2021-10-20 | Disposition: A | Discharge status: Home / Self Care | Payer: Medicaid Other | Source: Ambulatory Visit | Attending: Cardiology | Admitting: Cardiology

## 2021-10-20 ENCOUNTER — Ambulatory Visit (HOSPITAL_BASED_OUTPATIENT_CLINIC_OR_DEPARTMENT_OTHER)
Admission: RE | Admit: 2021-10-20 | Discharge: 2021-10-20 | Disposition: A | Discharge status: Home / Self Care | Payer: Medicaid Other | Source: Ambulatory Visit | Attending: Internal Medicine | Admitting: Internal Medicine

## 2021-10-20 ENCOUNTER — Other Ambulatory Visit: Payer: Self-pay

## 2021-10-20 ENCOUNTER — Other Ambulatory Visit (HOSPITAL_COMMUNITY): Payer: Self-pay | Admitting: *Deleted

## 2021-10-20 ENCOUNTER — Encounter (HOSPITAL_COMMUNITY): Payer: Self-pay | Admitting: Cardiology

## 2021-10-20 VITALS — BP 140/90 | HR 82 | Wt 205.6 lb

## 2021-10-20 DIAGNOSIS — I340 Nonrheumatic mitral (valve) insufficiency: Secondary | ICD-10-CM | POA: Insufficient documentation

## 2021-10-20 DIAGNOSIS — I5022 Chronic systolic (congestive) heart failure: Secondary | ICD-10-CM

## 2021-10-20 DIAGNOSIS — Z8249 Family history of ischemic heart disease and other diseases of the circulatory system: Secondary | ICD-10-CM | POA: Diagnosis not present

## 2021-10-20 DIAGNOSIS — E785 Hyperlipidemia, unspecified: Secondary | ICD-10-CM | POA: Diagnosis not present

## 2021-10-20 DIAGNOSIS — Z95810 Presence of automatic (implantable) cardiac defibrillator: Secondary | ICD-10-CM | POA: Insufficient documentation

## 2021-10-20 DIAGNOSIS — I447 Left bundle-branch block, unspecified: Secondary | ICD-10-CM | POA: Diagnosis not present

## 2021-10-20 DIAGNOSIS — I2510 Atherosclerotic heart disease of native coronary artery without angina pectoris: Secondary | ICD-10-CM | POA: Insufficient documentation

## 2021-10-20 DIAGNOSIS — I428 Other cardiomyopathies: Secondary | ICD-10-CM | POA: Diagnosis not present

## 2021-10-20 DIAGNOSIS — N183 Chronic kidney disease, stage 3 unspecified: Secondary | ICD-10-CM | POA: Insufficient documentation

## 2021-10-20 DIAGNOSIS — G4733 Obstructive sleep apnea (adult) (pediatric): Secondary | ICD-10-CM | POA: Diagnosis not present

## 2021-10-20 DIAGNOSIS — R3589 Other polyuria: Secondary | ICD-10-CM

## 2021-10-20 DIAGNOSIS — I130 Hypertensive heart and chronic kidney disease with heart failure and stage 1 through stage 4 chronic kidney disease, or unspecified chronic kidney disease: Secondary | ICD-10-CM | POA: Diagnosis not present

## 2021-10-20 LAB — ECHOCARDIOGRAM COMPLETE
Area-P 1/2: 3.54 cm2
Calc EF: 41.6 %
S' Lateral: 4 cm
Single Plane A2C EF: 44.7 %
Single Plane A4C EF: 36.2 %

## 2021-10-20 LAB — CBC
HCT: 51.2 % (ref 39.0–52.0)
Hemoglobin: 15.9 g/dL (ref 13.0–17.0)
MCH: 26.1 pg (ref 26.0–34.0)
MCHC: 31.1 g/dL (ref 30.0–36.0)
MCV: 83.9 fL (ref 80.0–100.0)
Platelets: 267 10*3/uL (ref 150–400)
RBC: 6.1 MIL/uL — ABNORMAL HIGH (ref 4.22–5.81)
RDW: 15.2 % (ref 11.5–15.5)
WBC: 9.2 10*3/uL (ref 4.0–10.5)
nRBC: 0 % (ref 0.0–0.2)

## 2021-10-20 LAB — BASIC METABOLIC PANEL
Anion gap: 7 (ref 5–15)
BUN: 13 mg/dL (ref 6–20)
CO2: 27 mmol/L (ref 22–32)
Calcium: 8.8 mg/dL — ABNORMAL LOW (ref 8.9–10.3)
Chloride: 109 mmol/L (ref 98–111)
Creatinine, Ser: 1.45 mg/dL — ABNORMAL HIGH (ref 0.61–1.24)
GFR, Estimated: 55 mL/min — ABNORMAL LOW (ref >60)
Glucose, Bld: 89 mg/dL (ref 70–99)
Potassium: 4.3 mmol/L (ref 3.5–5.1)
Sodium: 143 mmol/L (ref 135–145)

## 2021-10-20 LAB — LIPID PANEL
Cholesterol: 183 mg/dL (ref 0–200)
HDL: 54 mg/dL (ref >40)
LDL Cholesterol: 122 mg/dL — ABNORMAL HIGH (ref 0–99)
Total CHOL/HDL Ratio: 3.4 RATIO
Triglycerides: 34 mg/dL (ref <150)
VLDL: 7 mg/dL (ref 0–40)

## 2021-10-20 MED ORDER — SPIRONOLACTONE 25 MG PO TABS: 25.0000 mg | ORAL_TABLET | Freq: Every day | ORAL | 3 refills | Status: DC

## 2021-10-20 MED ORDER — CARVEDILOL 12.5 MG PO TABS: 12.5000 mg | ORAL_TABLET | Freq: Two times a day (BID) | ORAL | 3 refills | Status: DC

## 2021-10-20 MED ORDER — OMEPRAZOLE 20 MG PO CPDR: DELAYED_RELEASE_CAPSULE | ORAL | 11 refills | Status: DC

## 2021-10-20 MED ORDER — ENTRESTO 97-103 MG PO TABS
1.0000 | ORAL_TABLET | Freq: Two times a day (BID) | ORAL | 3 refills | Status: DC
Start: 2021-10-20 — End: 2021-10-20

## 2021-10-20 MED ORDER — ENTRESTO 97-103 MG PO TABS: 1.0000 | ORAL_TABLET | Freq: Two times a day (BID) | ORAL | 3 refills | Status: DC

## 2021-10-20 NOTE — Patient Instructions (Addendum)
EKG done today.  Labs done today. We will contact you only if your labs are abnormal.  INCREASE Carvedilol to 12.5mg  (1 tablet) by mouth 2 TIMES DAILY.  INCREASE Entresto 97-103mg  (1 tablet) by mouth 2 TIMES DAILY.   No other medication changes were made. Please continue all current medications as prescribed.  Your physician has recommended that you have a sleep study. This test records several body functions during sleep, including: brain activity, eye movement, oxygen and carbon dioxide blood levels, heart rate and rhythm, breathing rate and rhythm, the flow of air through your mouth and nose, snoring, body muscle movements, and chest and belly movement. The sleep center will be in contact with you.   Your physician recommends that you schedule a follow-up appointment in: 3 months with our NP/PA Clinic here in our office.   If you have any questions or concerns before your next appointment please send [place] a message through [place] or call our office at [phone].    TO LEAVE A MESSAGE FOR THE NURSE SELECT OPTION 2, PLEASE LEAVE A MESSAGE INCLUDING: YOUR NAME DATE OF BIRTH CALL BACK NUMBER REASON FOR CALL**this is important as we prioritize the call backs  YOU WILL RECEIVE A CALL BACK THE SAME DAY AS LONG AS YOU CALL BEFORE 4:00 PM   Do the following things EVERYDAY: Weigh yourself in the morning before breakfast. Write it down and keep it in a log. Take your medicines as prescribed Eat low salt foods--Limit salt (sodium) to 2000 mg per day.  Stay as active as you can everyday Limit all fluids for the day to less than 2 liters   At the [facility], you and your health needs are our priority. As part of our continuing mission to provide you with exceptional heart care, we have created designated Provider Care Teams. These Care Teams include your primary Cardiologist (physician) and Advanced Practice Providers (APPs- Physician Assistants and Nurse Practitioners)  who all work together to provide you with the care you need, when you need it.   You may see any of the following providers on your designated Care Team at your next follow up: Dr Glori Bickers Dr Haynes Kerns, NP Lyda Jester, [place] Audry Riles, PharmD   Please be sure to bring in all your medications bottles to every appointment.

## 2021-10-21 NOTE — Progress Notes (Signed)
Patient ID: Roger Crosby, male   DOB: 1961/08/08, 60 y.o.   MRN: [id number]  Cardiology: Dr. Aundra Dubin  60 y.o. with history of LBBB and nonischemic cardiomyopathy returns for followup of CHF.  Echo in 2014 showed EF 20-25% with regional wall motion abnormalities.  I did a left heart cath in 9/14 that showed no obstructive disease.  He has a nonischemic cardiomyopathy.  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   He was lost to followup for about 2 years (was in prison).  He presented to [facility] ER in 4/19 with chest pain.  UDS was positive for cocaine.  He says that he "messed up" and was grieving for a death in the family.  No cocaine since, and he had no used any cocaine prior to that since leaving prison.  Echo in 4/19 showed EF 50-55% with mild MR.  Cardiolite in 4/19 showed EF 40% with no perfusion defect (low risk).    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  Echo in 8/20 showed EF 40-45%.    Sleep study was done showing severe OSA.   Echo was done today and reviewed, showing EF 40-45%, moderate LVH, diffuse hypokinesis, PASP 33, mild MR.    Patient returns for followup of CHF.  He has significant daytime sleepiness.  He is going to have to do his sleep study again as it has been too long since his last study to get CPAP.  Rare smoking.  One use of cocaine recently in setting of stress.  No significant dyspnea or chest pain.  No orthopnea/PND.   No palpitations.  ECG (personally reviewed): NSR, BiV pacing  St Jude device interrogation: Stable thoracic impedance, 94% BiV pacing, no AF  Labs (9/14): K 3.9, creatinine 1.6 Labs (10/14): K 4, creatinine 1.3 Labs (4/19): K 3.7, creatinine 1.43, LDL 127, HIV negative Labs (8/20): K 3.8, creatinine 1.52, LDL 141, HIV negative Labs (8/21): K 4, creatinine 1.32  PMH: 1. Hypertension  2. GERD  3. Low back pain  4. LBBB: Newly noted 2011  5. Nonischemic cardiomyopathy:  Echo (5/11): EF 45-50%, mild hypokinesis of the apical anteroseptal wall, apical anterior wall, and true apex (LAD distribution), mild LAE.  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  Echo (6/14) with EF 20-25% with inferior/inferoseptal akinesis and hypokinesis elsewhere.  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  - St Jude CRT-D - Echo (4/19) with EF 50-55%, mild MR. - Cardiolite (4/19) with EF 40%, no significant perfusion defect.  - LHC (8/20): anomalous LCx off the RCA, but no significant CAD.  - Echo (8/20): EF 40-45%.  - Echo (11/22): EF 40-45%, moderate LVH, diffuse hypokinesis, PASP 33 mmHg, mild MR.  6. CKD  7. OSA: Severe on sleep study.   Family History:  Mother deceased CHF,DM,HTN  Father with heart trouble...died age 60.  1 sister Crohn's disease  1 sister healthy  1 brother died Prostate cancer/AIDS...was diagnosed with prostate cancer at age 60.  Father and mother both had MIs in their 20s   Social History:  [employer] information officer, does some landscaping now.   HS graduate.  Married  3 kids  Alcohol use-no. None since 2008  Drug use-prior cocaine but rare since 2008. Denies IVDU.  Prior smoker none since 2008.   Review of Systems  All systems reviewed and negative except as per HPI.  Current Outpatient Medications  Medication Sig Dispense Refill   aspirin 81 MG EC tablet Take 1 tablet (81 mg total) by mouth daily. 90 tablet 2   doxazosin (CARDURA) 1 MG tablet TAKE 1 TABLET BY MOUTH EVERY DAY 90 tablet 0   carvedilol (COREG) 12.5 MG tablet Take 1 tablet (12.5 mg total) by mouth 2 (two) times daily with a meal. 180 tablet 3   omeprazole (PRILOSEC) 20 MG capsule TAKE 1 CAPSULE BY MOUTH EVERY DAY 30 capsule 11   sacubitril-valsartan (ENTRESTO) 97-103 MG Take 1 tablet by mouth 2 (two) times daily. 180 tablet 3   spironolactone (ALDACTONE) 25 MG tablet Take 1 tablet (25 mg total) by mouth daily. 90  tablet 3   No current facility-administered medications for this encounter.    BP 140/90   Pulse 82   Wt 93.3 kg (205 lb 9.6 oz)   SpO2 97%   BMI 31.26 kg/m  General: NAD Neck: No JVD, no thyromegaly or thyroid nodule.  Lungs: Clear to auscultation bilaterally with normal respiratory effort. CV: Nondisplaced PMI.  Heart regular S1/S2, no S3/S4, no murmur.  No peripheral edema.  No carotid bruit.  Normal pedal pulses.  Abdomen: Soft, nontender, no hepatosplenomegaly, no distention.  Skin: Intact without lesions or rashes.  Neurologic: Alert and oriented x 3.  Psych: Normal affect. Extremities: No clubbing or cyanosis.  HEENT: Normal.   Assessment/Plan: 1. Nonischemic cardiomyopathy: Possibly related to cocaine, also HTN may play a role.  No ETOH currently, used cocaine once recently but denies any further use.  EF 20-25% with LBBB in 2014, had St Jude CRT-D device placed with improvement in LV function.  No significant CAD on cath, most recently in 8/20.  NYHA class II symptoms, no volume overload on exam or by [facility].  4/19 echo showed improvement in EF to 50-55%, and 8/20 echo showed EF back down a bit to 40-45%. Echo today showed stable EF 40-45%.  BP is mildly elevated.  - He has been taking Entresto once daily, increase to 97/103 bid.   - He has been taking Coreg once daily, increase to 12.5 mg bid.  - Continue spironolactone 25 mg daily.   - BMET today and again in 10 days.   2. CKD: Stage 3. BMET today.  3. HTN: Increasing Coreg and Entresto to bid.   4. OSA: Needs a repeat sleep study, will arrange.  5. Cocaine abuse: Strongly urged him to avoid.    Followup 3 months with APP.    [name] 10/21/2021

## 2021-10-28 NOTE — Progress Notes (Deleted)
Cardiology Office Note Date:  10/28/2021  Patient ID:  Roger Crosby, DOB [birth year], MRN [id number] PCP:  Patient, No Pcp Per (Inactive)  Cardiologist:  Dr. Aundra Dubin Electrophysiologist: ***  ***refresh   Chief Complaint: *** SVT ?  History of Present Illness: Roger Crosby is a 60 y.o. male with history of LBBB, NICM, CRT-D, HTN, GERD, CKD (III)  He most recently saw Dr. Aundra Dubin May 2021, at that time discussed intermittent LTFU 2/2 prison and poor follow up, + cocaine at one time as well spending time in rehab Was at that tie without recurrent drug use. BP was elevated, reported compliance with medicines. Coreg increased, aladactone added Recommended visit with Dr. Radford Pax for CPAP titration.  Last EP visit was with Dr. Caryl Comes back in 02/07/2015 as far as I can find  He saw Dr. Aundra Dubin 10/20/21, significant daytime sleepiness, needs new sleep study, Echo was done and reviewed, showing EF 40-45%, moderate LVH, diffuse hypokinesis, PASP 33, mild MR.   Device checked noting Stable thoracic impedance, 94% BiV pacing, no AF Was only taking his entresto and coreg once daily increased both to BID  *** + remotes *** symptoms *** labs, lipids *** meds, CM *** needs to see SK next *** cocaine??     Device information SJM CRT-D implanted 12/14/2014, LV lead was added 01/2014   Past Medical History:  Diagnosis Date   AICD (automatic cardioverter/defibrillator) present    a.  Unsuccessful LV lead placement 11/2014   CAD (coronary artery disease)    a. nonobstructive by [facility] 6/11: oOM1 40%, mild luminal irregs elsewhere b. cath 09/2014L no obstructive disease c. 03/2016: Low-risk NST   CHF (congestive heart failure) (HCC)    CKD (chronic kidney disease)    GERD (gastroesophageal reflux disease)    High cholesterol    HTN (hypertension)    Hx of echocardiogram    echo 5/11: EF 45-50%, mild HK of Ap AS wall, ap Ant wall and true apex (LAD distribution), mild LAE   LBBB (left bundle  branch block)    LBP (low back pain)    NICM (nonischemic cardiomyopathy) ([place])    Systolic heart failure ([place])     Past Surgical History:  Procedure Laterality Date   BI-VENTRICULAR IMPLANTABLE CARDIOVERTER DEFIBRILLATOR N/A 12/14/2014   with failed LV lead placeemnt   CARDIAC CATHETERIZATION Left 07/2013   with angiogram   DEBRIDEMENT AND CLOSURE WOUND Right 09/20/2015   Procedure: DEBRIDEMENT AND CLOSURE WOUND;  Surgeon: Dayna Barker, MD;  Location: [place];  Service: Plastics;  Laterality: Right;   EPICARDIAL PACING LEAD PLACEMENT N/A 02/07/2015   eicardial LV lead placed by Dr Laverta Baltimore   LEFT HEART CATH AND CORONARY ANGIOGRAPHY N/A 07/05/2019   Procedure: LEFT HEART CATH AND CORONARY ANGIOGRAPHY;  Surgeon: Larey Dresser, MD;  Location: [place] CV LAB;  Service: Cardiovascular;  Laterality: N/A;   LEFT HEART CATHETERIZATION WITH CORONARY ANGIOGRAM N/A 08/04/2013   Procedure: LEFT HEART CATHETERIZATION WITH CORONARY ANGIOGRAM;  Surgeon: Larey Dresser, MD;  Location: [facility] CATH LAB;  Service: Cardiovascular;  Laterality: N/A;   TENDON REPAIR Left 09/20/2015   Procedure: repair nerve tendon wrist;  Surgeon: Dayna Barker, MD;  Location: [place];  Service: Plastics;  Laterality: Left;   THORACOTOMY Left 02/07/2015   Procedure: THORACOTOMY MAJOR;  Surgeon: Gaye Pollack, MD;  Location: [facility] OR;  Service: Thoracic;  Laterality: Left;    Current Outpatient Medications  Medication Sig Dispense Refill   aspirin 81 MG EC tablet Take  1 tablet (81 mg total) by mouth daily. 90 tablet 2   carvedilol (COREG) 12.5 MG tablet Take 1 tablet (12.5 mg total) by mouth 2 (two) times daily with a meal. 180 tablet 3   doxazosin (CARDURA) 1 MG tablet TAKE 1 TABLET BY MOUTH EVERY DAY 90 tablet 0   omeprazole (PRILOSEC) 20 MG capsule TAKE 1 CAPSULE BY MOUTH EVERY DAY 30 capsule 11   sacubitril-valsartan (ENTRESTO) 97-103 MG Take 1 tablet by mouth 2 (two) times daily. 180 tablet 3   spironolactone (ALDACTONE) 25 MG  tablet Take 1 tablet (25 mg total) by mouth daily. 90 tablet 3   No current facility-administered medications for this visit.    Allergies:   Patient has no known allergies.   Social History:  The patient  reports that he has been smoking cigarettes. He has a 3.00 pack-year smoking history. He has never used smokeless tobacco. He reports current alcohol use. He reports that he does not currently use drugs after having used the following drugs: "Crack" cocaine.   Family History:  The patient's family history includes Congestive Heart Failure in his mother; Crohn's disease in his sister; Diabetes in his mother; HIV/AIDS in his brother; Heart Problems in his father; Heart attack in his father and mother; Heart disease in his father and mother; Hypertension in his mother; Prostate cancer in his brother.  ROS:  Please see the history of present illness.    All other systems are reviewed and otherwise negative.   PHYSICAL EXAM:  VS:  There were no vitals taken for this visit. BMI: There is no height or weight on file to calculate BMI. Well nourished, well developed, in no acute distress HEENT: normocephalic, atraumatic Neck: no JVD, carotid bruits or masses Cardiac:  *** RRR; no significant murmurs, no rubs, or gallops Lungs:  *** CTA b/l, no wheezing, rhonchi or rales Abd: soft, nontender MS: no deformity or *** atrophy Ext: *** no edema Skin: warm and dry, no rash Neuro:  No gross deficits appreciated Psych: euthymic mood, full affect  ***ICD site is stable, no tethering or discomfort   EKG:  Done today and reviewed by myself shows  ***  Device interrogation done today and reviewed by myself:  ***   10/20/2021: TTE IMPRESSIONS   1. Left ventricular ejection fraction, by estimation, is 40 to 45%. The  left ventricle has mildly decreased function. The left ventricle  demonstrates global hypokinesis. There is moderate left ventricular  hypertrophy. Left ventricular diastolic   parameters are consistent with Grade I diastolic dysfunction (impaired  relaxation).   2. Right ventricular systolic function is normal. The right ventricular  size is normal. There is normal pulmonary artery systolic pressure. The  estimated right ventricular systolic pressure is 75.1 mmHg.   3. The mitral valve is normal in structure. Mild mitral valve  regurgitation. No evidence of mitral stenosis.   4. The aortic valve is tricuspid. Aortic valve regurgitation is not  visualized. No aortic stenosis is present.   5. The inferior vena cava is normal in size with <50% respiratory  variability, suggesting right atrial pressure of 8 mmHg.    07/05/2019: LHC 1. Anomalous LCx off proximal RCA.  2. No significant coronary disease.    Suspect noncardiac chest pain (it started prior to him using cocaine).    07/04/2019: TTE IMPRESSIONS   1. The left ventricle has mild-moderately reduced systolic function, with  an ejection fraction of 40-45%. The cavity size was normal. There is mild  concentric left ventricular hypertrophy. Left ventricular diastolic  Doppler parameters are consistent  with impaired relaxation. There is abnormal septal motion consistent with  left bundle branch block. Left ventrical global hypokinesis without  regional wall motion abnormalities.   2. Left atrial size was mildly dilated.   3. Right atrial size was mildly dilated.   4. The aorta is normal in size and structure.   5. When compared to the prior study: 03/11/2018, there appears to be loss  of LV synchrony and decreased left ventricular systolic function. Consider  re-evaluation of CRT.   Recent Labs: 10/20/2021: BUN 13; Creatinine, Ser 1.45; Hemoglobin 15.9; Platelets 267; Potassium 4.3; Sodium 143  10/20/2021: Cholesterol 183; HDL 54; LDL Cholesterol 122; Total CHOL/HDL Ratio 3.4; Triglycerides 34; VLDL 7   CrCl cannot be calculated (Unknown ideal weight.).   Wt Readings from Last 3 Encounters:   10/20/21 205 lb 9.6 oz (93.3 kg)  07/26/20 230 lb 3.2 oz (104.4 kg)  07/03/20 228 lb 9.6 oz (103.7 kg)     Other studies reviewed: Additional studies/records reviewed today include: summarized above  ASSESSMENT AND PLAN:  CRT-D ***  NICM Chronic CHF ***  HTN ***  Disposition: F/u with ***  Current medicines are reviewed at length with the patient today.  The patient did not have any concerns regarding medicines.  Venetia Night, PA-C 10/28/2021 8:23 AM     [place] [address] [phone] (office)  [phone] (fax)

## 2021-10-29 ENCOUNTER — Encounter: Payer: Medicaid Other | Admitting: Physician Assistant

## 2021-11-09 NOTE — Progress Notes (Signed)
Cardiology Office Note Date:  11/09/2021  Patient ID:  Roger Crosby, DOB 08/14/1961, MRN [id number] PCP:  Patient, No Pcp Per (Inactive)  Cardiologist:  Dr. Aundra Dubin Electrophysiologist: Dr. Caryl Comes    Chief Complaint: re-establish device care  History of Present Illness: Roger Crosby is a 60 y.o. male with history of LBBB, NICM, CRT-D, HTN, GERD, CKD (III)  Last EP visit was with Dr. Caryl Comes back in 02/07/2015 as far as I can find  He saw Dr. Aundra Dubin May 2021, at that time discussed intermittent LTFU 2/2 prison and poor follow up, + cocaine at one time as well spending time in rehab Was at that tie without recurrent drug use. BP was elevated, reported compliance with medicines. Coreg increased, aladactone added Recommended visit with Dr. Radford Pax for CPAP titration.  He saw Dr. Aundra Dubin 10/20/21, significant daytime sleepiness, needs new sleep study, Echo was done and reviewed, showing EF 40-45%, moderate LVH, diffuse hypokinesis, PASP 33, mild MR.   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  Feels like he has good exertional capacity No CP, palpitations or cardiac awareness No SOB, no dizzy spells, near syncope or syncope Denies any shocks from his device    Device information SJM CRT-D implanted 12/14/2014, LV lead was added 01/2014   Past Medical History:  Diagnosis Date   AICD (automatic cardioverter/defibrillator) present    a.  Unsuccessful LV lead placement 11/2014   CAD (coronary artery disease)    a. nonobstructive by [facility] 6/11: oOM1 40%, mild luminal irregs elsewhere b. cath 09/2014L no obstructive disease c. 03/2016: Low-risk NST   CHF (congestive heart failure) (HCC)    CKD (chronic kidney disease)    GERD (gastroesophageal reflux disease)    High cholesterol    HTN  (hypertension)    Hx of echocardiogram    echo 5/11: EF 45-50%, mild HK of Ap AS wall, ap Ant wall and true apex (LAD distribution), mild LAE   LBBB (left bundle branch block)    LBP (low back pain)    NICM (nonischemic cardiomyopathy) ([place])    Systolic heart failure ([place])     Past Surgical History:  Procedure Laterality Date   BI-VENTRICULAR IMPLANTABLE CARDIOVERTER DEFIBRILLATOR N/A 12/14/2014   with failed LV lead placeemnt   CARDIAC CATHETERIZATION Left 07/2013   with angiogram   DEBRIDEMENT AND CLOSURE WOUND Right 09/20/2015   Procedure: DEBRIDEMENT AND CLOSURE WOUND;  Surgeon: Dayna Barker, MD;  Location: [place];  Service: Plastics;  Laterality: Right;   EPICARDIAL PACING LEAD PLACEMENT N/A 02/07/2015   eicardial LV lead placed by Dr Laverta Baltimore   LEFT HEART CATH AND CORONARY ANGIOGRAPHY N/A 07/05/2019   Procedure: LEFT HEART CATH AND CORONARY ANGIOGRAPHY;  Surgeon: Larey Dresser, MD;  Location: [place] CV LAB;  Service: Cardiovascular;  Laterality: N/A;   LEFT HEART CATHETERIZATION WITH CORONARY ANGIOGRAM N/A 08/04/2013   Procedure: LEFT HEART CATHETERIZATION WITH CORONARY ANGIOGRAM;  Surgeon: Larey Dresser, MD;  [facility] CATH LAB;  Service: Cardiovascular;  Laterality: N/A;   TENDON REPAIR Left 09/20/2015   Procedure: repair nerve tendon wrist;  Surgeon: Dayna Barker, MD;  Location: [place];  Service: Plastics;  Laterality: Left;   THORACOTOMY Left 02/07/2015   Procedure: THORACOTOMY MAJOR;  Surgeon: Gaye Pollack, MD;  Location:  [facility] OR;  Service: Thoracic;  Laterality: Left;    Current Outpatient Medications  Medication Sig Dispense Refill   aspirin 81 MG EC tablet Take 1 tablet (81 mg total) by mouth daily. 90 tablet 2   carvedilol (COREG) 12.5 MG tablet Take 1 tablet (12.5 mg total) by mouth 2 (two) times daily with a meal. 180 tablet 3   doxazosin (CARDURA) 1 MG tablet TAKE 1 TABLET BY MOUTH EVERY DAY 90 tablet 0   omeprazole (PRILOSEC) 20 MG capsule TAKE 1 CAPSULE BY  MOUTH EVERY DAY 30 capsule 11   sacubitril-valsartan (ENTRESTO) 97-103 MG Take 1 tablet by mouth 2 (two) times daily. 180 tablet 3   spironolactone (ALDACTONE) 25 MG tablet Take 1 tablet (25 mg total) by mouth daily. 90 tablet 3   No current facility-administered medications for this visit.    Allergies:   Patient has no known allergies.   Social History:  The patient  reports that he has been smoking cigarettes. He has a 3.00 pack-year smoking history. He has never used smokeless tobacco. He reports current alcohol use. He reports that he does not currently use drugs after having used the following drugs: "Crack" cocaine.   Family History:  The patient's family history includes Congestive Heart Failure in his mother; Crohn's disease in his sister; Diabetes in his mother; HIV/AIDS in his brother; Heart Problems in his father; Heart attack in his father and mother; Heart disease in his father and mother; Hypertension in his mother; Prostate cancer in his brother.  ROS:  Please see the history of present illness.    All other systems are reviewed and otherwise negative.   PHYSICAL EXAM:  VS:  There were no vitals taken for this visit. BMI: There is no height or weight on file to calculate BMI. Well nourished, well developed, in no acute distress HEENT: normocephalic, atraumatic Neck: no JVD, carotid bruits or masses Cardiac:  RRR; no significant murmurs, no rubs, or gallops Lungs:  CTA b/l, no wheezing, rhonchi or rales Abd: soft, nontender MS: no deformity or atrophy Ext:  no edema Skin: warm and dry, no rash Neuro:  No gross deficits appreciated Psych: euthymic mood, full affect  ICD site is stable, no tethering or discomfort   EKG:  not done today  Device interrogation done today and reviewed by myself:  Battery and lead measurements are good Approx ERI is 11,8 mo AMS episodes noted All available EGMs are reviewed Only 5-6 are true Afib and only seconds longs Others are PACs  or brief 1:1 tachycardias Burden <1%   10/20/2021: TTE IMPRESSIONS   1. Left ventricular ejection fraction, by estimation, is 40 to 45%. The  left ventricle has mildly decreased function. The left ventricle  demonstrates global hypokinesis. There is moderate left ventricular  hypertrophy. Left ventricular diastolic  parameters are consistent with Grade I diastolic dysfunction (impaired  relaxation).   2. Right ventricular systolic function is normal. The right ventricular  size is normal. There is normal pulmonary artery systolic pressure. The  estimated right ventricular systolic pressure is 97.9 mmHg.   3. The mitral valve is normal in structure. Mild mitral valve  regurgitation. No evidence of mitral stenosis.   4. The aortic valve is tricuspid. Aortic valve regurgitation is not  visualized. No aortic stenosis is present.   5. The inferior vena cava is normal in size with <50% respiratory  variability, suggesting right atrial pressure of 8 mmHg.    07/05/2019: LHC 1. Anomalous LCx off  proximal RCA.  2. No significant coronary disease.    Suspect noncardiac chest pain (it started prior to him using cocaine).    07/04/2019: TTE IMPRESSIONS   1. The left ventricle has mild-moderately reduced systolic function, with  an ejection fraction of 40-45%. The cavity size was normal. There is mild  concentric left ventricular hypertrophy. Left ventricular diastolic  Doppler parameters are consistent  with impaired relaxation. There is abnormal septal motion consistent with  left bundle branch block. Left ventrical global hypokinesis without  regional wall motion abnormalities.   2. Left atrial size was mildly dilated.   3. Right atrial size was mildly dilated.   4. The aorta is normal in size and structure.   5. When compared to the prior study: 03/11/2018, there appears to be loss  of LV synchrony and decreased left ventricular systolic function. Consider  re-evaluation of CRT.    Recent Labs: 10/20/2021: BUN 13; Creatinine, Ser 1.45; Hemoglobin 15.9; Platelets 267; Potassium 4.3; Sodium 143  10/20/2021: Cholesterol 183; HDL 54; LDL Cholesterol 122; Total CHOL/HDL Ratio 3.4; Triglycerides 34; VLDL 7   CrCl cannot be calculated (Unknown ideal weight.).   Wt Readings from Last 3 Encounters:  10/20/21 205 lb 9.6 oz (93.3 kg)  07/26/20 230 lb 3.2 oz (104.4 kg)  07/03/20 228 lb 9.6 oz (103.7 kg)     Other studies reviewed: Additional studies/records reviewed today include: summarized above  ASSESSMENT AND PLAN:  CRT-D Intact function Just under a year to ERI I have asked device clinic to put him on monthly battery checks Cyber security upgrade completed  NICM Chronic CHF No symptoms or exam findings of voume OL CorVue is down, though exam does not support this C/w AHF team  HTN Looks OK   Given time since his last EP visit, d/w Dr. Caryl Comes.  Disposition: F/u with monthly remotes, and back in clinic on 1 year, sooner if needed.  Current medicines are reviewed at length with the patient today.  The patient did not have any concerns regarding medicines.  Venetia Night, PA-C 11/09/2021 4:08 PM     [place] [address] [phone] (office)  [phone] (fax)

## 2021-11-11 ENCOUNTER — Ambulatory Visit (INDEPENDENT_AMBULATORY_CARE_PROVIDER_SITE_OTHER): Payer: Medicaid Other | Admitting: Physician Assistant

## 2021-11-11 ENCOUNTER — Encounter: Payer: Self-pay | Admitting: Physician Assistant

## 2021-11-11 ENCOUNTER — Other Ambulatory Visit: Payer: Self-pay

## 2021-11-11 VITALS — BP 130/76 | HR 79 | Ht 68.0 in | Wt 208.4 lb

## 2021-11-11 DIAGNOSIS — I10 Essential (primary) hypertension: Secondary | ICD-10-CM

## 2021-11-11 DIAGNOSIS — Z95810 Presence of automatic (implantable) cardiac defibrillator: Secondary | ICD-10-CM

## 2021-11-11 DIAGNOSIS — I5022 Chronic systolic (congestive) heart failure: Secondary | ICD-10-CM | POA: Diagnosis not present

## 2021-11-11 DIAGNOSIS — I428 Other cardiomyopathies: Secondary | ICD-10-CM

## 2021-11-11 LAB — CUP PACEART INCLINIC DEVICE CHECK
Battery Remaining Longevity: 11 mo
Brady Statistic RA Percent Paced: 0.49 %
Brady Statistic RV Percent Paced: 94 %
Date Time Interrogation Session: 20221213163159
Implantable Lead Implant Date: 20160115
Implantable Lead Implant Date: 20160115
Implantable Lead Implant Date: 20160310
Implantable Lead Location: 753858
Implantable Lead Location: 753859
Implantable Lead Location: 753860
Implantable Lead Model: 5076
Implantable Lead Model: 511212
Implantable Lead Serial Number: 247936
Implantable Pulse Generator Implant Date: 20160115
Lead Channel Pacing Threshold Amplitude: 0.875 V
Lead Channel Pacing Threshold Pulse Width: 0.5 ms
Lead Channel Sensing Intrinsic Amplitude: 12 mV
Lead Channel Sensing Intrinsic Amplitude: 2.5 mV
Lead Channel Setting Pacing Amplitude: 2 V
Lead Channel Setting Pacing Amplitude: 2 V
Lead Channel Setting Pacing Amplitude: 2.5 V
Lead Channel Setting Pacing Pulse Width: 0.5 ms
Lead Channel Setting Pacing Pulse Width: 0.5 ms
Lead Channel Setting Sensing Sensitivity: 0.5 mV
Pulse Gen Serial Number: 7209167

## 2021-11-11 MED ORDER — ASPIRIN 81 MG PO TBEC: 81.0000 mg | DELAYED_RELEASE_TABLET | Freq: Every day | ORAL | 2 refills | Status: DC

## 2021-11-11 MED ORDER — SPIRONOLACTONE 25 MG PO TABS: 25.0000 mg | ORAL_TABLET | Freq: Every day | ORAL | 3 refills | Status: DC

## 2021-11-11 MED ORDER — CARVEDILOL 12.5 MG PO TABS: 12.5000 mg | ORAL_TABLET | Freq: Two times a day (BID) | ORAL | 3 refills | Status: DC

## 2021-11-11 NOTE — Patient Instructions (Signed)
Medication Instructions:   Your physician recommends that you continue on your current medications as directed. Please refer to the Current Medication list given to you today.   *If you need a refill on your cardiac medications before your next appointment, please call your pharmacy*   Lab Work: [place]    If you have labs (blood work) drawn today and your tests are completely normal, you will receive your results only by: [place] (if you have MyChart) OR A paper copy in the mail If you have any lab test that is abnormal or we need to change your treatment, we will call you to review the results.   Testing/Procedures:  NONE ORDERED  TODAY      Follow-Up: At [facility], you and your health needs are our priority.  As part of our continuing mission to provide you with exceptional heart care, we have created designated Provider Care Teams.  These Care Teams include your primary Cardiologist (physician) and Advanced Practice Providers (APPs -  Physician Assistants and Nurse Practitioners) who all work together to provide you with the care you need, when you need it.  We recommend signing up for the patient portal called "MyChart".  Sign up information is provided on this After Visit Summary.  MyChart is used to connect with patients for Virtual Visits (Telemedicine).  Patients are able to view lab/test results, encounter notes, upcoming appointments, etc.  Non-urgent messages can be sent to your provider as well.   To learn more about what you can do with MyChart, go to [email or web address].    Your next appointment:   1 year(s)  The format for your next appointment:   In Person  Provider:   You may see Dr. Caryl Comes   or one of the following Advanced Practice Providers on your designated Care Team:   Tommye Standard, [place] Legrand Como "Jonni Sanger" Chalmers Cater, [place]   Other Instructions

## 2021-11-18 ENCOUNTER — Telehealth: Payer: Self-pay | Admitting: Physician Assistant

## 2021-11-18 ENCOUNTER — Other Ambulatory Visit: Payer: Self-pay

## 2021-11-18 ENCOUNTER — Other Ambulatory Visit (HOSPITAL_COMMUNITY): Payer: Self-pay | Admitting: *Deleted

## 2021-11-18 MED ORDER — CARVEDILOL 12.5 MG PO TABS: 12.5000 mg | ORAL_TABLET | Freq: Two times a day (BID) | ORAL | 3 refills | Status: DC

## 2021-11-18 MED ORDER — OMEPRAZOLE 20 MG PO CPDR: DELAYED_RELEASE_CAPSULE | ORAL | 11 refills | Status: DC

## 2021-11-18 MED ORDER — SPIRONOLACTONE 25 MG PO TABS: 25.0000 mg | ORAL_TABLET | Freq: Every day | ORAL | 3 refills | Status: DC

## 2021-11-18 MED ORDER — ASPIRIN 81 MG PO TBEC: 81.0000 mg | DELAYED_RELEASE_TABLET | Freq: Every day | ORAL | 2 refills | Status: DC

## 2021-11-18 MED ORDER — ENTRESTO 97-103 MG PO TABS: 1.0000 | ORAL_TABLET | Freq: Two times a day (BID) | ORAL | 3 refills | Status: DC

## 2021-11-18 NOTE — Telephone Encounter (Signed)
Pt's medications were sent to pt's pharmacy as requested. Confirmation received.  

## 2021-11-18 NOTE — Telephone Encounter (Signed)
°*  STAT* If patient is at the pharmacy, call can be transferred to refill team.   1. Which medications need to be refilled? (please list name of each medication and dose if known) carvedilol (COREG) 12.5 MG tablet spironolactone (ALDACTONE) 25 MG tablet aspirin 81 MG EC tablet  2. Which pharmacy/location (including street and city if local pharmacy) is medication to be sent to? [place], [place]  3. Do they need a 30 day or 90 day supply?  90 day supply   Pharmacy did not receive prescriptions called pharmacy confirming this.

## 2021-12-12 ENCOUNTER — Ambulatory Visit (INDEPENDENT_AMBULATORY_CARE_PROVIDER_SITE_OTHER): Payer: Medicaid Other

## 2021-12-12 DIAGNOSIS — I447 Left bundle-branch block, unspecified: Secondary | ICD-10-CM

## 2021-12-12 LAB — CUP PACEART REMOTE DEVICE CHECK
Battery Remaining Longevity: 10 mo
Battery Remaining Percentage: 13 %
Battery Voltage: 2.71 V
Brady Statistic AP VP Percent: 1 %
Brady Statistic AP VS Percent: 1 %
Brady Statistic AS VP Percent: 99 %
Brady Statistic AS VS Percent: 1 %
Brady Statistic RA Percent Paced: 1 %
Date Time Interrogation Session: 20230113135903
HighPow Impedance: 57 Ohm
HighPow Impedance: 57 Ohm
Implantable Lead Implant Date: 20160115
Implantable Lead Implant Date: 20160115
Implantable Lead Implant Date: 20160310
Implantable Lead Location: 753858
Implantable Lead Location: 753859
Implantable Lead Location: 753860
Implantable Lead Model: 5076
Implantable Lead Model: 511212
Implantable Lead Serial Number: 247936
Implantable Pulse Generator Implant Date: 20160115
Lead Channel Impedance Value: 350 Ohm
Lead Channel Impedance Value: 450 Ohm
Lead Channel Impedance Value: 510 Ohm
Lead Channel Pacing Threshold Amplitude: 0.5 V
Lead Channel Pacing Threshold Amplitude: 0.75 V
Lead Channel Pacing Threshold Amplitude: 0.875 V
Lead Channel Pacing Threshold Pulse Width: 0.5 ms
Lead Channel Pacing Threshold Pulse Width: 0.5 ms
Lead Channel Pacing Threshold Pulse Width: 0.5 ms
Lead Channel Sensing Intrinsic Amplitude: 12 mV
Lead Channel Sensing Intrinsic Amplitude: 2.7 mV
Lead Channel Setting Pacing Amplitude: 2 V
Lead Channel Setting Pacing Amplitude: 2 V
Lead Channel Setting Pacing Amplitude: 2.5 V
Lead Channel Setting Pacing Pulse Width: 0.5 ms
Lead Channel Setting Pacing Pulse Width: 0.5 ms
Lead Channel Setting Sensing Sensitivity: 0.5 mV
Pulse Gen Serial Number: 7209167

## 2021-12-16 ENCOUNTER — Other Ambulatory Visit: Payer: Self-pay | Admitting: Cardiology

## 2021-12-23 NOTE — Progress Notes (Signed)
Remote ICD transmission.   

## 2022-01-20 ENCOUNTER — Encounter (HOSPITAL_COMMUNITY): Payer: Medicaid Other

## 2022-03-19 ENCOUNTER — Other Ambulatory Visit: Payer: Self-pay

## 2022-03-19 ENCOUNTER — Ambulatory Visit
Admission: EM | Admit: 2022-03-19 | Discharge: 2022-03-19 | Disposition: A | Discharge status: Home / Self Care | Payer: Medicaid Other | Attending: Urgent Care | Admitting: Urgent Care

## 2022-03-19 ENCOUNTER — Telehealth: Payer: Self-pay | Admitting: Emergency Medicine

## 2022-03-19 ENCOUNTER — Encounter: Payer: Self-pay | Admitting: Emergency Medicine

## 2022-03-19 DIAGNOSIS — M7521 Bicipital tendinitis, right shoulder: Secondary | ICD-10-CM

## 2022-03-19 MED ORDER — NAPROXEN 375 MG PO TABS
ORAL_TABLET | ORAL | 0 refills | Status: DC
Start: 2022-03-19 — End: 2022-03-19

## 2022-03-19 MED ORDER — METHYLPREDNISOLONE SODIUM SUCC 125 MG IJ SOLR
80.0000 mg | Freq: Once | INTRAMUSCULAR | Status: AC
  Administered 2022-03-19: 80 mg via INTRAMUSCULAR

## 2022-03-19 MED ORDER — NAPROXEN 375 MG PO TABS: ORAL_TABLET | ORAL | 0 refills | Status: DC

## 2022-03-19 NOTE — Discharge Instructions (Addendum)
Your symptoms are most consistent with biceps tendinitis on the right. ?This condition is very difficult to treat due to continued range of motion of the shoulder. ?I would recommend you contact your primary care physician for referral to physical therapy. ?Try to limit use of your right upper extremity is much as possible. ?You can purchase over-the-counter Voltaren gel and rub on your shoulder up to 4 times daily. ?Take naproxen twice daily with food.  Should symptoms persist or worsen consider going to orthopedics for possible shoulder injection. ? ?

## 2022-03-19 NOTE — ED Triage Notes (Signed)
Patient reports he was seen before at this location for arm pain.  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  Pain in right shoulder and radiates down right upper arm ?

## 2022-03-19 NOTE — ED Provider Notes (Signed)
?[facility] ? ? ? ?CSN: [id number] ?Arrival date & time: 03/19/22  1308 ? ? ?  ? ?History   ?Chief Complaint ?Chief Complaint  ?Patient presents with  ? Arm Pain  ? ? ?HPI ?Roger Crosby is a 61 y.o. male.  ? ?Pleasant 61 yo male presents today with concerns of recurrent shoulder pain. He states it started initially in Nov 2022. He received a steroid shot in our office, but states "I was supposed to rest it, but I went back to work the next day." He states he works part time in [employer]. He denies any known trauma to the area. Over the past 2-3 weeks, he feels like the pain has returned. He reports it is primarily anterior R shoulder near his coracoid, and radiates down into his mid bicep. Certain ROM cause worse pain than others, but pain has been relatively constant. He denies weakness or radicular pain. He denies symptoms on the L. He is right handed. He has not tried any treatments. He is requesting a repeat steroid shot as it was mildly effective previously and pt denied adverse side effects.  ? ? ?Arm Pain ? ? ?Past Medical History:  ?Diagnosis Date  ? AICD (automatic cardioverter/defibrillator) present   ? a.  Unsuccessful LV lead placement 11/2014  ? CAD (coronary artery disease)   ? a. nonobstructive by [facility] 6/11: oOM1 40%, mild luminal irregs elsewhere b. cath 09/2014L no obstructive disease c. 03/2016: Low-risk NST  ? CHF (congestive heart failure) ([place])   ? CKD (chronic kidney disease)   ? GERD (gastroesophageal reflux disease)   ? High cholesterol   ? HTN (hypertension)   ? Hx of echocardiogram   ? echo 5/11: EF 45-50%, mild HK of Ap AS wall, ap Ant wall and true apex (LAD distribution), mild LAE  ? LBBB (left bundle branch block)   ? LBP (low back pain)   ? NICM (nonischemic cardiomyopathy) ([place])   ? Systolic heart failure ([place])   ? ? ?Patient Active Problem List  ? Diagnosis Date Noted  ? Class 2 obesity due to excess calories without serious comorbidity with body mass index (BMI) of 35.0  to 35.9 in adult 07/26/2020  ? Tobacco dependence 07/26/2020  ? Polyuria 07/26/2020  ? Elevated troponin 07/04/2019  ? Cocaine use 07/04/2019  ? Obesity (BMI 30.0-34.9) 07/04/2019  ? Chest pain 03/11/2018  ? Substance abuse ([place]) 04/08/2016  ? Atypical chest pain 04/07/2016  ? CKD (chronic kidney disease) stage 3, GFR 30-59 ml/min (HCC) 04/07/2016  ? S/P ICD (internal cardiac defibrillator) procedure 02/07/2015  ? NICM (nonischemic cardiomyopathy) ([place]) 12/15/2014  ? Chronic systolic CHF (congestive heart failure) ([place]) 08/23/2013  ? DENTAL PAIN 04/04/2010  ? DYSPNEA 04/04/2010  ? LBBB (left bundle branch block) 02/14/2010  ? SKIN TAG 02/14/2010  ? LEG PAIN 02/14/2010  ? Hyperlipidemia 01/07/2010  ? ERECTILE DYSFUNCTION 04/09/2009  ? NODULAR PROSTATE WITHOUT URINARY OBSTRUCTION 04/09/2009  ? Essential hypertension 01/28/2009  ? GERD 01/28/2009  ? LOW BACK PAIN 01/28/2009  ? ? ?Past Surgical History:  ?Procedure Laterality Date  ? BI-VENTRICULAR IMPLANTABLE CARDIOVERTER DEFIBRILLATOR N/A 12/14/2014  ? with failed LV lead placeemnt  ? CARDIAC CATHETERIZATION Left 07/2013  ? with angiogram  ? DEBRIDEMENT AND CLOSURE WOUND Right 09/20/2015  ? Procedure: DEBRIDEMENT AND CLOSURE WOUND;  Surgeon: Dayna Barker, MD;  Location: [facility];  Service: Plastics;  Laterality: Right;  ? EPICARDIAL PACING LEAD PLACEMENT N/A 02/07/2015  ? eicardial LV lead placed by Dr Laverta Baltimore  ?  LEFT HEART CATH AND CORONARY ANGIOGRAPHY N/A 07/05/2019  ? Procedure: LEFT HEART CATH AND CORONARY ANGIOGRAPHY;  Surgeon: Larey Dresser, MD;  Location: [facility];  Service: Cardiovascular;  Laterality: N/A;  ? LEFT HEART CATHETERIZATION WITH CORONARY ANGIOGRAM N/A 08/04/2013  ? Procedure: LEFT HEART CATHETERIZATION WITH CORONARY ANGIOGRAM;  Surgeon: Larey Dresser, MD;  Location: [facility] CATH LAB;  Service: Cardiovascular;  Laterality: N/A;  ? TENDON REPAIR Left 09/20/2015  ? Procedure: repair nerve tendon wrist;  Surgeon: Dayna Barker, MD;  Location: [facility];   Service: Plastics;  Laterality: Left;  ? THORACOTOMY Left 02/07/2015  ? Procedure: THORACOTOMY MAJOR;  Surgeon: Gaye Pollack, MD;  Location: [facility] OR;  Service: Thoracic;  Laterality: Left;  ? ? ? ? ? ?Home Medications   ? ?Prior to Admission medications   ?Medication Sig Start Date End Date Taking? Authorizing Provider  ?aspirin 81 MG EC tablet Take 1 tablet (81 mg total) by mouth daily. 11/18/21   Baldwin Jamaica, PA-C  ?carvedilol (COREG) 12.5 MG tablet Take 1 tablet (12.5 mg total) by mouth 2 (two) times daily with a meal. 11/18/21   Baldwin Jamaica, PA-C  ?doxazosin (CARDURA) 1 MG tablet TAKE 1 TABLET BY MOUTH EVERY DAY 02/19/21   Ladell Pier, MD  ?naproxen (NAPROSYN) 375 MG tablet Take one tab PO BID with food 03/19/22   Maico Mulvehill, [place] L, PA  ?omeprazole (PRILOSEC) 20 MG capsule TAKE 1 CAPSULE BY MOUTH EVERY DAY 11/18/21   Larey Dresser, MD  ?sacubitril-valsartan (ENTRESTO) 97-103 MG Take 1 tablet by mouth 2 (two) times daily. 11/18/21   Larey Dresser, MD  ?spironolactone (ALDACTONE) 25 MG tablet Take 1 tablet (25 mg total) by mouth daily. 11/18/21   Baldwin Jamaica, PA-C  ? ? ?Family History ?Family History  ?Problem Relation Age of Onset  ? Heart attack Mother   ?     in 61's  ? Heart disease Mother   ?     ischemic  ? Congestive Heart Failure Mother   ? Diabetes Mother   ? Hypertension Mother   ? Heart attack Father   ?     in 3's  ? Heart disease Father   ?     ischemic  ? Heart Problems Father   ? Crohn's disease Sister   ? Prostate cancer Brother   ? HIV/AIDS Brother   ? ? ?Social History ?Social History  ? ?Tobacco Use  ? Smoking status: Some Days  ?  Packs/day: 0.12  ?  Years: 25.00  ?  Pack years: 3.00  ?  Types: Cigarettes  ? Smokeless tobacco: Never  ?Vaping Use  ? Vaping Use: Never used  ?Substance Use Topics  ? Alcohol use: Yes  ?  Comment: occasionally  ? Drug use: Not Currently  ?  Types: "Crack" cocaine  ?  Comment: clean x 6 mths  ? ? ? ?Allergies   ?Patient has no known  allergies. ? ? ?Review of Systems ?Review of Systems  ?Musculoskeletal:  Positive for arthralgias (R shoulder pain).  ?All other systems reviewed and are negative. ? ? ?Physical Exam ?Triage Vital Signs ?ED Triage Vitals  ?Enc Vitals Group  ?   BP 03/19/22 1538 126/83  ?   Pulse Rate 03/19/22 1538 80  ?   Resp 03/19/22 1538 20  ?   Temp 03/19/22 1538 98.2 ?F (36.8 ?C)  ?   Temp Source 03/19/22 1538 Oral  ?   SpO2  03/19/22 1538 97 %  ?   Weight --   ?   Height --   ?   Head Circumference --   ?   Peak Flow --   ?   Pain Score 03/19/22 1536 7  ?   Pain Loc --   ?   Pain Edu? --   ?   Excl. in [place]? --   ? ?No data found. ? ?Updated Vital Signs ?BP 126/83 (BP Location: Left Arm) Comment (BP Location): large cuff  Pulse 80   Temp 98.2 ?F (36.8 ?C) (Oral)   Resp 20   SpO2 97%  ? ?Visual Acuity ?Right Eye Distance:   ?Left Eye Distance:   ?Bilateral Distance:   ? ?Right Eye Near:   ?Left Eye Near:    ?Bilateral Near:    ? ?Physical Exam ?Vitals and nursing note reviewed.  ?Constitutional:   ?   General: He is not in acute distress. ?   Appearance: Normal appearance. He is not ill-appearing or toxic-appearing.  ?HENT:  ?   Head: Normocephalic and atraumatic.  ?Cardiovascular:  ?   Rate and Rhythm: Normal rate.  ?Pulmonary:  ?   Effort: Pulmonary effort is normal.  ?Musculoskeletal:  ?   Right shoulder: Tenderness (bicep and deltoid) and bony tenderness (coracoid process only) present. No swelling, deformity, effusion, laceration or crepitus. Normal range of motion. Normal strength. Normal pulse.  ?   Left shoulder: Normal. No swelling, deformity, effusion, laceration, tenderness, bony tenderness or crepitus. Normal range of motion. Normal strength. Normal pulse.  ?   Right upper arm: Normal. No swelling, edema, deformity, lacerations, tenderness or bony tenderness.  ?   Right elbow: Normal. No swelling, deformity, effusion or lacerations. Normal range of motion. No tenderness.  ?   Cervical back: Normal range of motion  and neck supple. No rigidity or tenderness.  ?   Comments: FROM shoulder ?Negative impingement ?Negative sulcus sign ?Negative O'briens, neer  ?Positive Yergason and hawkins  ?Lymphadenopathy:  ?   Cervical: No

## 2022-04-16 ENCOUNTER — Ambulatory Visit (INDEPENDENT_AMBULATORY_CARE_PROVIDER_SITE_OTHER): Payer: Medicaid Other

## 2022-04-16 DIAGNOSIS — I428 Other cardiomyopathies: Secondary | ICD-10-CM

## 2022-04-17 LAB — CUP PACEART REMOTE DEVICE CHECK
Battery Remaining Longevity: 6 mo
Battery Remaining Percentage: 7 %
Battery Voltage: 2.65 V
Brady Statistic AP VP Percent: 1 %
Brady Statistic AP VS Percent: 1 %
Brady Statistic AS VP Percent: 95 %
Brady Statistic AS VS Percent: 4.4 %
Brady Statistic RA Percent Paced: 1 %
Date Time Interrogation Session: 20230519094756
HighPow Impedance: 72 Ohm
HighPow Impedance: 72 Ohm
Implantable Lead Implant Date: 20160115
Implantable Lead Implant Date: 20160115
Implantable Lead Implant Date: 20160310
Implantable Lead Location: 753858
Implantable Lead Location: 753859
Implantable Lead Location: 753860
Implantable Lead Model: 5076
Implantable Lead Model: 511212
Implantable Lead Serial Number: 247936
Implantable Pulse Generator Implant Date: 20160115
Lead Channel Impedance Value: 350 Ohm
Lead Channel Impedance Value: 430 Ohm
Lead Channel Impedance Value: 530 Ohm
Lead Channel Pacing Threshold Amplitude: 0.5 V
Lead Channel Pacing Threshold Amplitude: 0.625 V
Lead Channel Pacing Threshold Amplitude: 0.75 V
Lead Channel Pacing Threshold Pulse Width: 0.5 ms
Lead Channel Pacing Threshold Pulse Width: 0.5 ms
Lead Channel Pacing Threshold Pulse Width: 0.5 ms
Lead Channel Sensing Intrinsic Amplitude: 12 mV
Lead Channel Sensing Intrinsic Amplitude: 2.2 mV
Lead Channel Setting Pacing Amplitude: 2 V
Lead Channel Setting Pacing Amplitude: 2 V
Lead Channel Setting Pacing Amplitude: 2.5 V
Lead Channel Setting Pacing Pulse Width: 0.5 ms
Lead Channel Setting Pacing Pulse Width: 0.5 ms
Lead Channel Setting Sensing Sensitivity: 0.5 mV
Pulse Gen Serial Number: 7209167

## 2022-04-24 NOTE — Addendum Note (Signed)
Addended by: Cheri Kearns A on: 04/24/2022 11:43 AM   Modules accepted: Level of Service

## 2022-04-24 NOTE — Progress Notes (Signed)
Remote ICD transmission.   

## 2022-05-18 ENCOUNTER — Ambulatory Visit (INDEPENDENT_AMBULATORY_CARE_PROVIDER_SITE_OTHER): Payer: Medicaid Other

## 2022-05-18 DIAGNOSIS — I428 Other cardiomyopathies: Secondary | ICD-10-CM

## 2022-05-22 LAB — CUP PACEART REMOTE DEVICE CHECK
Battery Remaining Longevity: 5 mo
Battery Remaining Percentage: 6 %
Battery Voltage: 2.63 V
Brady Statistic AP VP Percent: 1 %
Brady Statistic AP VS Percent: 1 %
Brady Statistic AS VP Percent: 95 %
Brady Statistic AS VS Percent: 4.7 %
Brady Statistic RA Percent Paced: 1 %
Date Time Interrogation Session: 20230622101949
HighPow Impedance: 69 Ohm
HighPow Impedance: 69 Ohm
Implantable Lead Implant Date: 20160115
Implantable Lead Implant Date: 20160115
Implantable Lead Implant Date: 20160310
Implantable Lead Location: 753858
Implantable Lead Location: 753859
Implantable Lead Location: 753860
Implantable Lead Model: 5076
Implantable Lead Model: 511212
Implantable Lead Serial Number: 247936
Implantable Pulse Generator Implant Date: 20160115
Lead Channel Impedance Value: 350 Ohm
Lead Channel Impedance Value: 450 Ohm
Lead Channel Impedance Value: 510 Ohm
Lead Channel Pacing Threshold Amplitude: 0.5 V
Lead Channel Pacing Threshold Amplitude: 0.75 V
Lead Channel Pacing Threshold Amplitude: 0.875 V
Lead Channel Pacing Threshold Pulse Width: 0.5 ms
Lead Channel Pacing Threshold Pulse Width: 0.5 ms
Lead Channel Pacing Threshold Pulse Width: 0.5 ms
Lead Channel Sensing Intrinsic Amplitude: 12 mV
Lead Channel Sensing Intrinsic Amplitude: 4.5 mV
Lead Channel Setting Pacing Amplitude: 2 V
Lead Channel Setting Pacing Amplitude: 2 V
Lead Channel Setting Pacing Amplitude: 2.5 V
Lead Channel Setting Pacing Pulse Width: 0.5 ms
Lead Channel Setting Pacing Pulse Width: 0.5 ms
Lead Channel Setting Sensing Sensitivity: 0.5 mV
Pulse Gen Serial Number: 7209167

## 2022-06-09 NOTE — Progress Notes (Signed)
Remote ICD transmission.   

## 2022-06-19 ENCOUNTER — Ambulatory Visit
Admission: EM | Admit: 2022-06-19 | Discharge: 2022-06-19 | Disposition: A | Discharge status: Home / Self Care | Payer: Medicaid Other | Attending: Physician Assistant | Admitting: Physician Assistant

## 2022-06-19 DIAGNOSIS — L309 Dermatitis, unspecified: Secondary | ICD-10-CM

## 2022-06-19 MED ORDER — PREDNISONE 20 MG PO TABS: 40.0000 mg | ORAL_TABLET | Freq: Every day | ORAL | 0 refills | Status: AC

## 2022-06-19 MED ORDER — METHYLPREDNISOLONE SODIUM SUCC 125 MG IJ SOLR
80.0000 mg | Freq: Once | INTRAMUSCULAR | Status: AC
  Administered 2022-06-19: 80 mg via INTRAMUSCULAR

## 2022-06-19 NOTE — ED Provider Notes (Signed)
[facility]    CSN: [id number] Arrival date & time: 06/19/22  1227      History   Chief Complaint Chief Complaint  Patient presents with   Rash    HPI Roger Crosby is a 61 y.o. male.   Patient here today for evaluation of possible poison ivy to arm and genital area. Rash is itchy. He reports he has had poison ivy outbreak in the past which was similar. He has not had fever. He denies any concerns for stds. He denies any shortness of breath. He does not report treatment for symptoms.   The history is provided by the patient.  Rash Associated symptoms: no fever, no nausea, no shortness of breath and not vomiting     Past Medical History:  Diagnosis Date   AICD (automatic cardioverter/defibrillator) present    a.  Unsuccessful LV lead placement 11/2014   CAD (coronary artery disease)    a. nonobstructive by [facility] 6/11: oOM1 40%, mild luminal irregs elsewhere b. cath 09/2014L no obstructive disease c. 03/2016: Low-risk NST   CHF (congestive heart failure) (HCC)    CKD (chronic kidney disease)    GERD (gastroesophageal reflux disease)    High cholesterol    HTN (hypertension)    Hx of echocardiogram    echo 5/11: EF 45-50%, mild HK of Ap AS wall, ap Ant wall and true apex (LAD distribution), mild LAE   LBBB (left bundle branch block)    LBP (low back pain)    NICM (nonischemic cardiomyopathy) ([place])    Systolic heart failure ([place])     Patient Active Problem List   Diagnosis Date Noted   Class 2 obesity due to excess calories without serious comorbidity with body mass index (BMI) of 35.0 to 35.9 in adult 07/26/2020   Tobacco dependence 07/26/2020   Polyuria 07/26/2020   Elevated troponin 07/04/2019   Cocaine use 07/04/2019   Obesity (BMI 30.0-34.9) 07/04/2019   Chest pain 03/11/2018   Substance abuse ([place]) 04/08/2016   Atypical chest pain 04/07/2016   CKD (chronic kidney disease) stage 3, GFR 30-59 ml/min (HCC) 04/07/2016   S/P ICD (internal cardiac  defibrillator) procedure 02/07/2015   NICM (nonischemic cardiomyopathy) ([place]) 60/63/0160   Chronic systolic CHF (congestive heart failure) ([place]) 08/23/2013   DENTAL PAIN 04/04/2010   DYSPNEA 04/04/2010   LBBB (left bundle branch block) 02/14/2010   SKIN TAG 02/14/2010   LEG PAIN 02/14/2010   Hyperlipidemia 01/07/2010   ERECTILE DYSFUNCTION 04/09/2009   NODULAR PROSTATE WITHOUT URINARY OBSTRUCTION 04/09/2009   Essential hypertension 01/28/2009   GERD 01/28/2009   LOW BACK PAIN 01/28/2009    Past Surgical History:  Procedure Laterality Date   BI-VENTRICULAR IMPLANTABLE CARDIOVERTER DEFIBRILLATOR N/A 12/14/2014   with failed LV lead placeemnt   CARDIAC CATHETERIZATION Left 07/2013   with angiogram   DEBRIDEMENT AND CLOSURE WOUND Right 09/20/2015   Procedure: DEBRIDEMENT AND CLOSURE WOUND;  Surgeon: Dayna Barker, MD;  Location: [facility];  Service: Plastics;  Laterality: Right;   EPICARDIAL PACING LEAD PLACEMENT N/A 02/07/2015   eicardial LV lead placed by Dr Laverta Baltimore   LEFT HEART CATH AND CORONARY ANGIOGRAPHY N/A 07/05/2019   Procedure: LEFT HEART CATH AND CORONARY ANGIOGRAPHY;  Surgeon: Larey Dresser, MD;  Location: [facility];  Service: Cardiovascular;  Laterality: N/A;   LEFT HEART CATHETERIZATION WITH CORONARY ANGIOGRAM N/A 08/04/2013   Procedure: LEFT HEART CATHETERIZATION WITH CORONARY ANGIOGRAM;  Surgeon: Larey Dresser, MD;  Location[facility] C[facility] LAB;  Service: Cardiovascular;  Laterality: N/A;   TENDON REPAIR Left 09/20/2015   Procedure: repair nerve tendon wrist;  Surgeon: Dayna Barker, MD;  Location: [facility];  Service: Plastics;  Laterality: Left;   THORACOTOMY Left 02/07/2015   Procedure: THORACOTOMY MAJOR;  Surgeon: Gaye Pollack, MD;  Location: [facility];  Service: Thoracic;  Laterality: Left;       Home Medications    Prior to Admission medications   Medication Sig Start Date End Date Taking? Authorizing Provider  predniSONE (DELTASONE) 20 MG tablet Take 2 tablets (40 mg  total) by mouth daily with breakfast for 5 days. 06/19/22 06/24/22 Yes Francene Finders, PA-C  aspirin 81 MG EC tablet Take 1 tablet (81 mg total) by mouth daily. 11/18/21   Baldwin Jamaica, PA-C  carvedilol (COREG) 12.5 MG tablet Take 1 tablet (12.5 mg total) by mouth 2 (two) times daily with a meal. 11/18/21   Baldwin Jamaica, PA-C  doxazosin (CARDURA) 1 MG tablet TAKE 1 TABLET BY MOUTH EVERY DAY 02/19/21   Ladell Pier, MD  naproxen (NAPROSYN) 375 MG tablet Take one tab PO BID with food 03/19/22   [place], [place] L, PA  omeprazole (PRILOSEC) 20 MG capsule TAKE 1 CAPSULE BY MOUTH EVERY DAY 11/18/21   Larey Dresser, MD  sacubitril-valsartan (ENTRESTO) 97-103 MG Take 1 tablet by mouth 2 (two) times daily. 11/18/21   Larey Dresser, MD  spironolactone (ALDACTONE) 25 MG tablet Take 1 tablet (25 mg total) by mouth daily. 11/18/21   Baldwin Jamaica, PA-C    Family History Family History  Problem Relation Age of Onset   Heart attack Mother        in 65's   Heart disease Mother        ischemic   Congestive Heart Failure Mother    Diabetes Mother    Hypertension Mother    Heart attack Father        in 16's   Heart disease Father        ischemic   Heart Problems Father    Crohn's disease Sister    Prostate cancer Brother    HIV/AIDS Brother     Social History Social History   Tobacco Use   Smoking status: Some Days    Packs/day: 0.12    Years: 25.00    Total pack years: 3.00    Types: Cigarettes   Smokeless tobacco: Never  Vaping Use   Vaping Use: Never used  Substance Use Topics   Alcohol use: Yes    Comment: occasionally   Drug use: Not Currently    Types: "Crack" cocaine    Comment: clean x 6 mths     Allergies   Patient has no known allergies.   Review of Systems Review of Systems  Constitutional:  Negative for chills and fever.  Eyes:  Negative for discharge and redness.  Respiratory:  Negative for shortness of breath.   Gastrointestinal:  Negative  for nausea and vomiting.  Skin:  Positive for color change, rash and wound.  Neurological:  Negative for numbness.     Physical Exam Triage Vital Signs ED Triage Vitals  Enc Vitals Group     BP 06/19/22 1236 135/90     Pulse Rate 06/19/22 1236 77     Resp 06/19/22 1236 18     Temp 06/19/22 1236 98 F (36.7 C)     Temp Source 06/19/22 1236 Oral     SpO2 06/19/22 1236 98 %  Weight --      Height --      Head Circumference --      Peak Flow --      Pain Score 06/19/22 1237 0     Pain Loc --      Pain Edu? --      Excl. in [place]? --    No data found.  Updated Vital Signs BP 135/90 (BP Location: Left Arm)   Pulse 77   Temp 98 F (36.7 C) (Oral)   Resp 18   SpO2 98%      Physical Exam Vitals and nursing note reviewed.  Constitutional:      General: He is not in acute distress.    Appearance: Normal appearance. He is not ill-appearing.  HENT:     Head: Normocephalic and atraumatic.  Eyes:     Conjunctiva/sclera: Conjunctivae normal.  Cardiovascular:     Rate and Rhythm: Normal rate.  Pulmonary:     Effort: Pulmonary effort is normal.  Skin:    Findings: Rash (few erythematous papular lesions to left hand/ forearm) present.  Neurological:     Mental Status: He is alert.  Psychiatric:        Mood and Affect: Mood normal.        Behavior: Behavior normal.        Thought Content: Thought content normal.      UC Treatments / Results  Labs (all labs ordered are listed, but only abnormal results are displayed) Labs Reviewed - No data to display  EKG   Radiology No results found.  Procedures Procedures (including critical care time)  Medications Ordered in UC Medications  methylPREDNISolone sodium succinate (SOLU-MEDROL) 125 mg/2 mL injection 80 mg (80 mg Intramuscular Given 06/19/22 1256)    Initial Impression / Assessment and Plan / UC Course  I have reviewed the triage vital signs and the nursing notes.  Pertinent labs & imaging results that were  available during my care of the patient were reviewed by me and considered in my medical decision making (see chart for details).    Prednisone prescribed to cover poison ivy dermatitis. Recommended further evaluation if symptoms do not improve or worsen. Patient expresses understanding.    Final Clinical Impressions(s) / UC Diagnoses   Final diagnoses:  Dermatitis     Discharge Instructions       Take prednisone as prescribed.   Follow up with any concerns.     ED Prescriptions     Medication Sig Dispense Auth. Provider   predniSONE (DELTASONE) 20 MG tablet Take 2 tablets (40 mg total) by mouth daily with breakfast for 5 days. 10 tablet Francene Finders, PA-C      PDMP not reviewed this encounter.   Francene Finders, PA-C 06/19/22 1654

## 2022-06-19 NOTE — Discharge Instructions (Signed)
  Take prednisone as prescribed.   Follow up with any concerns.

## 2022-06-19 NOTE — ED Triage Notes (Signed)
Pt c/o possible poison ivy exposure to left hand and genital area onset ~ Tuesday

## 2022-06-24 ENCOUNTER — Telehealth (HOSPITAL_COMMUNITY): Payer: Self-pay | Admitting: Pharmacist

## 2022-06-24 NOTE — Telephone Encounter (Signed)
Advanced Heart Failure Patient Advocate Encounter  Prior Authorization for Delene Loll has been approved.    PA# [id number] Effective dates: 06/23/22 through 06/24/23  Audry Riles, PharmD, BCPS, BCCP, CPP Heart Failure Clinic Pharmacist [phone]

## 2022-08-19 DIAGNOSIS — G4730 Sleep apnea, unspecified: Secondary | ICD-10-CM | POA: Insufficient documentation

## 2022-08-19 DIAGNOSIS — G4733 Obstructive sleep apnea (adult) (pediatric): Secondary | ICD-10-CM | POA: Insufficient documentation

## 2022-08-28 ENCOUNTER — Other Ambulatory Visit (HOSPITAL_COMMUNITY): Payer: Self-pay | Admitting: Cardiology

## 2022-09-03 DIAGNOSIS — Z95810 Presence of automatic (implantable) cardiac defibrillator: Secondary | ICD-10-CM | POA: Insufficient documentation

## 2022-09-07 ENCOUNTER — Telehealth: Payer: Self-pay | Admitting: Physician Assistant

## 2022-09-07 ENCOUNTER — Ambulatory Visit (INDEPENDENT_AMBULATORY_CARE_PROVIDER_SITE_OTHER): Payer: Medicaid Other

## 2022-09-07 DIAGNOSIS — I5022 Chronic systolic (congestive) heart failure: Secondary | ICD-10-CM

## 2022-09-07 DIAGNOSIS — I428 Other cardiomyopathies: Secondary | ICD-10-CM

## 2022-09-07 LAB — CUP PACEART REMOTE DEVICE CHECK
Battery Remaining Longevity: 0 mo
Battery Voltage: 2.6 V
Brady Statistic AP VP Percent: 1 %
Brady Statistic AP VS Percent: 1 %
Brady Statistic AS VP Percent: 95 %
Brady Statistic AS VS Percent: 4.6 %
Brady Statistic RA Percent Paced: 1 %
Date Time Interrogation Session: 20231009131554
HighPow Impedance: 70 Ohm
HighPow Impedance: 70 Ohm
Implantable Lead Implant Date: 20160115
Implantable Lead Implant Date: 20160115
Implantable Lead Implant Date: 20160310
Implantable Lead Location: 753858
Implantable Lead Location: 753859
Implantable Lead Location: 753860
Implantable Lead Model: 5076
Implantable Lead Model: 511212
Implantable Lead Serial Number: 247936
Implantable Pulse Generator Implant Date: 20160115
Lead Channel Impedance Value: 360 Ohm
Lead Channel Impedance Value: 450 Ohm
Lead Channel Impedance Value: 490 Ohm
Lead Channel Pacing Threshold Amplitude: 0.5 V
Lead Channel Pacing Threshold Amplitude: 0.75 V
Lead Channel Pacing Threshold Amplitude: 1 V
Lead Channel Pacing Threshold Pulse Width: 0.5 ms
Lead Channel Pacing Threshold Pulse Width: 0.5 ms
Lead Channel Pacing Threshold Pulse Width: 0.5 ms
Lead Channel Sensing Intrinsic Amplitude: 12 mV
Lead Channel Sensing Intrinsic Amplitude: 3.9 mV
Lead Channel Setting Pacing Amplitude: 2 V
Lead Channel Setting Pacing Amplitude: 2 V
Lead Channel Setting Pacing Amplitude: 2.5 V
Lead Channel Setting Pacing Pulse Width: 0.5 ms
Lead Channel Setting Pacing Pulse Width: 0.5 ms
Lead Channel Setting Sensing Sensitivity: 0.5 mV
Pulse Gen Serial Number: 7209167

## 2022-09-07 NOTE — Telephone Encounter (Signed)
Scheduled remote reviewed. Normal device function.  Device has reached ERI 10/7 - route to triage. 2 AMS, 4-8sec in duration, PAT. Next remote to be determined.   Called patient to advise his device has reached RRT. Advised I will forward to scheduling and someone will reach out for apt. Patient agreeable and voiced understanding.

## 2022-09-07 NOTE — Telephone Encounter (Signed)
Called patient to assess. Reports a "humming" noise. Denies any vibration. Advised patient that the device do not make noises but can vibrate. Asked patient to send manual transmission to review. Patient voiced understanding and states he will send shortly. Advised if he needs assistance to please call.

## 2022-09-07 NOTE — Telephone Encounter (Signed)
  1. Has your device fired? no  2. Is you device beeping? Making a humming sound  3. Are you experiencing draining or swelling at device site?   4. Are you calling to see if we received your device transmission?   5. Have you passed out?     Please route to [place]

## 2022-09-10 NOTE — Telephone Encounter (Signed)
Pt is scheduled to see [name], PA-C on 09/25/2022.

## 2022-09-23 NOTE — Progress Notes (Deleted)
Electrophysiology Office Note Date: 09/23/2022  ID:  Roger Crosby, DOB 15-Sep-1961, MRN [id number]  PCP: Patient, No Pcp Per Primary Cardiologist: None Electrophysiologist: Virl Axe, MD   CC: Routine ICD follow-up  Roger Crosby is a 61 y.o. male seen today for Virl Axe, MD for routine electrophysiology followup. Since last being seen in our clinic the patient reports doing ***.  he denies chest pain, palpitations, dyspnea, PND, orthopnea, nausea, vomiting, dizziness, syncope, edema, weight gain, or early satiety.     He has not had ICD shocks.   Device History: SJM CRT-D implanted 12/14/2014, LV lead was added 01/2014  Past Medical History:  Diagnosis Date   AICD (automatic cardioverter/defibrillator) present    a.  Unsuccessful LV lead placement 11/2014   CAD (coronary artery disease)    a. nonobstructive by [facility] 6/11: oOM1 40%, mild luminal irregs elsewhere b. cath 09/2014L no obstructive disease c. 03/2016: Low-risk NST   CHF (congestive heart failure) (HCC)    CKD (chronic kidney disease)    GERD (gastroesophageal reflux disease)    High cholesterol    HTN (hypertension)    Hx of echocardiogram    echo 5/11: EF 45-50%, mild HK of Ap AS wall, ap Ant wall and true apex (LAD distribution), mild LAE   LBBB (left bundle branch block)    LBP (low back pain)    NICM (nonischemic cardiomyopathy) ([place])    Systolic heart failure ([place])    Past Surgical History:  Procedure Laterality Date   BI-VENTRICULAR IMPLANTABLE CARDIOVERTER DEFIBRILLATOR N/A 12/14/2014   with failed LV lead placeemnt   CARDIAC CATHETERIZATION Left 07/2013   with angiogram   DEBRIDEMENT AND CLOSURE WOUND Right 09/20/2015   Procedure: DEBRIDEMENT AND CLOSURE WOUND;  Surgeon: Dayna Barker, MD;  Location: [facility];  Service: Plastics;  Laterality: Right;   EPICARDIAL PACING LEAD PLACEMENT N/A 02/07/2015   eicardial LV lead placed by Dr Laverta Baltimore   LEFT HEART CATH AND CORONARY ANGIOGRAPHY N/A 07/05/2019    Procedure: LEFT HEART CATH AND CORONARY ANGIOGRAPHY;  Surgeon: Larey Dresser, MD;  Location: [place] [facility];  Service: Cardiovascular;  Laterality: N/A;   LEFT HEART CATHETERIZATION WITH CORONARY ANGIOGRAM N/A 08/04/2013   Procedure: LEFT HEART CATHETERIZATION WITH CORONARY ANGIOGRAM;  Surgeon: Larey Dresser, MD;  Location: [facility] CATH LAB;  Service: Cardiovascular;  Laterality: N/A;   TENDON REPAIR Left 09/20/2015   Procedure: repair nerve tendon wrist;  Surgeon: Dayna Barker, MD;  Location: [facility];  Service: Plastics;  Laterality: Left;   THORACOTOMY Left 02/07/2015   Procedure: THORACOTOMY MAJOR;  Surgeon: Gaye Pollack, MD;  [facility];  Service: Thoracic;  Laterality: Left;    Current Outpatient Medications  Medication Sig Dispense Refill   aspirin 81 MG EC tablet Take 1 tablet (81 mg total) by mouth daily. 90 tablet 2   carvedilol (COREG) 12.5 MG tablet Take 1 tablet (12.5 mg total) by mouth 2 (two) times daily with a meal. 180 tablet 3   doxazosin (CARDURA) 1 MG tablet TAKE 1 TABLET BY MOUTH EVERY DAY 90 tablet 0   naproxen (NAPROSYN) 375 MG tablet Take one tab PO BID with food 20 tablet 0   omeprazole (PRILOSEC) 20 MG capsule TAKE 1 CAPSULE BY MOUTH EVERY DAY 30 capsule 0   sacubitril-valsartan (ENTRESTO) 97-103 MG Take 1 tablet by mouth 2 (two) times daily. 180 tablet 3   spironolactone (ALDACTONE) 25 MG tablet Take 1 tablet (25 mg total) by mouth daily. 90 tablet  3   No current facility-administered medications for this visit.    Allergies:   Patient has no known allergies.   Social History: Social History   Socioeconomic History   Marital status: Married    Spouse name: Not on file   Number of children: 3   Years of education: 12 grade   Highest education level: Not on file  Occupational History   Occupation: disability  Tobacco Use   Smoking status: Some Days    Packs/day: 0.12    Years: 25.00    Total pack years: 3.00    Types: Cigarettes   Smokeless  tobacco: Never  Vaping Use   Vaping Use: Never used  Substance and Sexual Activity   Alcohol use: Yes    Comment: occasionally   Drug use: Not Currently    Types: "Crack" cocaine    Comment: clean x 6 mths   Sexual activity: Yes  Other Topics Concern   Not on file  Social History Narrative   Not on file   Social Determinants of Health   Financial Resource Strain: Not on file  Food Insecurity: Not on file  Transportation Needs: Not on file  Physical Activity: Not on file  Stress: Not on file  Social Connections: Not on file  Intimate Partner Violence: Not on file    Family History: Family History  Problem Relation Age of Onset   Heart attack Mother        in 35's   Heart disease Mother        ischemic   Congestive Heart Failure Mother    Diabetes Mother    Hypertension Mother    Heart attack Father        in 68's   Heart disease Father        ischemic   Heart Problems Father    Crohn's disease Sister    Prostate cancer Brother    HIV/AIDS Brother     Review of Systems: All other systems reviewed and are otherwise negative except as noted above.   Physical Exam: There were no vitals filed for this visit.   GEN- The patient is well appearing, alert and oriented x 3 today.   HEENT: normocephalic, atraumatic; sclera clear, conjunctiva pink; hearing intact; oropharynx clear; neck supple, no JVP Lymph- no cervical lymphadenopathy Lungs- Clear to ausculation bilaterally, normal work of breathing.  No wheezes, rales, rhonchi Heart- Regular  rate and rhythm, no murmurs, rubs or gallops, PMI not laterally displaced GI- soft, non-tender, non-distended, bowel sounds present, no hepatosplenomegaly Extremities- no clubbing or cyanosis. No peripheral edema; DP/PT/radial pulses 2+ bilaterally MS- no significant deformity or atrophy Skin- warm and dry, no rash or lesion; ICD pocket well healed Psych- euthymic mood, full affect Neuro- strength and sensation are  intact  ICD interrogation- reviewed in detail today,  See PACEART report  EKG:  EKG is ordered today. Personal review of EKG ordered today shows ***  Recent Labs: 10/20/2021: BUN 13; Creatinine, Ser 1.45; Hemoglobin 15.9; Platelets 267; Potassium 4.3; Sodium 143   Wt Readings from Last 3 Encounters:  11/11/21 208 lb 6.4 oz (94.5 kg)  10/20/21 205 lb 9.6 oz (93.3 kg)  07/26/20 230 lb 3.2 oz (104.4 kg)     Other studies Reviewed: Additional studies/ records that were reviewed today include: Previous EP office notes.   Assessment and Plan:  1.  Chronic systolic dysfunction s/p [employer] Jude CRT-D  euvolemic today Stable on an appropriate medical regimen Normal ICD function See  Pace Art report No changes today  2. HTN Stable on current regimen    Current medicines are reviewed at length with the patient today.   =  Labs/ tests ordered today include: *** No orders of the defined types were placed in this encounter.    Disposition:   Follow up with Dr. Caryl Comes in 12 months    Signed, [name], PA-C  09/23/2022 9:05 PM  [place] [address] [place] [id number] [phone] (office) [phone] (fax)

## 2022-09-25 ENCOUNTER — Encounter: Payer: Medicaid Other | Admitting: Student

## 2022-09-25 DIAGNOSIS — I10 Essential (primary) hypertension: Secondary | ICD-10-CM

## 2022-09-25 DIAGNOSIS — I428 Other cardiomyopathies: Secondary | ICD-10-CM

## 2022-10-08 ENCOUNTER — Ambulatory Visit (INDEPENDENT_AMBULATORY_CARE_PROVIDER_SITE_OTHER): Payer: Medicaid Other

## 2022-10-08 DIAGNOSIS — I428 Other cardiomyopathies: Secondary | ICD-10-CM

## 2022-10-09 NOTE — Progress Notes (Signed)
Remote ICD transmission.   

## 2022-10-10 LAB — CUP PACEART REMOTE DEVICE CHECK
Battery Remaining Longevity: 0 mo
Battery Voltage: 2.57 V
Brady Statistic AP VP Percent: 1 %
Brady Statistic AP VS Percent: 1 %
Brady Statistic AS VP Percent: 95 %
Brady Statistic AS VS Percent: 4.3 %
Brady Statistic RA Percent Paced: 1 %
Date Time Interrogation Session: 20231109163545
HighPow Impedance: 73 Ohm
HighPow Impedance: 73 Ohm
Implantable Lead Connection Status: 753985
Implantable Lead Connection Status: 753985
Implantable Lead Connection Status: 753985
Implantable Lead Implant Date: 20160115
Implantable Lead Implant Date: 20160115
Implantable Lead Implant Date: 20160310
Implantable Lead Location: 753858
Implantable Lead Location: 753859
Implantable Lead Location: 753860
Implantable Lead Model: 5076
Implantable Lead Model: 511212
Implantable Lead Serial Number: 247936
Implantable Pulse Generator Implant Date: 20160115
Lead Channel Impedance Value: 350 Ohm
Lead Channel Impedance Value: 450 Ohm
Lead Channel Impedance Value: 540 Ohm
Lead Channel Pacing Threshold Amplitude: 0.5 V
Lead Channel Pacing Threshold Amplitude: 0.75 V
Lead Channel Pacing Threshold Amplitude: 1 V
Lead Channel Pacing Threshold Pulse Width: 0.5 ms
Lead Channel Pacing Threshold Pulse Width: 0.5 ms
Lead Channel Pacing Threshold Pulse Width: 0.5 ms
Lead Channel Sensing Intrinsic Amplitude: 12 mV
Lead Channel Sensing Intrinsic Amplitude: 4.5 mV
Lead Channel Setting Pacing Amplitude: 2 V
Lead Channel Setting Pacing Amplitude: 2 V
Lead Channel Setting Pacing Amplitude: 2.5 V
Lead Channel Setting Pacing Pulse Width: 0.5 ms
Lead Channel Setting Pacing Pulse Width: 0.5 ms
Lead Channel Setting Sensing Sensitivity: 0.5 mV
Pulse Gen Serial Number: 7209167

## 2022-10-15 NOTE — H&P (View-Only) (Signed)
Cardiology Office Note Date:  10/15/2022  Patient ID:  Roger Crosby, DOB [birth year], MRN [id number] PCP:  Patient, No Pcp Per  Cardiologist:  Dr. Aundra Dubin Electrophysiologist: Dr. Caryl Comes    Chief Complaint:  RRT  History of Present Illness: Roger Crosby is a 61 y.o. male with history of LBBB, NICM, CRT-D, HTN, GERD, CKD (III)  Last EP visit was with Dr. Caryl Comes back in 02/07/2015 as far as I can find  He saw Dr. Aundra Dubin May 2021, at that time discussed intermittent LTFU 2/2 prison and poor follow up, + cocaine at one time as well spending time in rehab Was at that tie without recurrent drug use. BP was elevated, reported compliance with medicines. Coreg increased, aladactone added Recommended visit with Dr. Radford Pax for CPAP titration.  He saw Dr. Aundra Dubin 10/20/21, significant daytime sleepiness, needs new sleep study, Echo was done and reviewed, showing EF 40-45%, moderate LVH, diffuse hypokinesis, PASP 33, mild MR.   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  Feels like he has good exertional capacity No CP, palpitations or cardiac awareness No SOB, no dizzy spells, near syncope or syncope Denies any shocks from his device CorVue was down, but no exam findings or symptoms of volume OL No changes were made, to c/w HF team  He has not seen [place] or HF since  Device ERI alert  TODAY He is living in [place] now, has gotten a PMD (sees them for the 1st time next week), no cardiology there yet. He has felt well, no CP, palpitations or cardiac awareness. He got a cold about 2 weeks ago, generally feeling better but can't seem to shake the sinus congestion/cough, no fever. No SOB, denies DOE otherwise. No dizzy spells, near syncope or syncope. No shocks   Device  information SJM CRT-D implanted 12/14/2014, LV lead was added 01/2015 (epicardial) RA/RV MDT leads  Past Medical History:  Diagnosis Date   AICD (automatic cardioverter/defibrillator) present    a.  Unsuccessful LV lead placement 11/2014   CAD (coronary artery disease)    a. nonobstructive by [facility] 6/11: oOM1 40%, mild luminal irregs elsewhere b. cath 09/2014L no obstructive disease c. 03/2016: Low-risk NST   CHF (congestive heart failure) (HCC)    CKD (chronic kidney disease)    GERD (gastroesophageal reflux disease)    High cholesterol    HTN (hypertension)    Hx of echocardiogram    echo 5/11: EF 45-50%, mild HK of Ap AS wall, ap Ant wall and true apex (LAD distribution), mild LAE   LBBB (left bundle branch block)    LBP (low back pain)    NICM (nonischemic cardiomyopathy) ([place])    Systolic heart failure ([place])     Past Surgical History:  Procedure Laterality Date   BI-VENTRICULAR IMPLANTABLE CARDIOVERTER DEFIBRILLATOR N/A 12/14/2014   with failed LV lead placeemnt   CARDIAC CATHETERIZATION Left 07/2013   with angiogram   DEBRIDEMENT AND CLOSURE WOUND Right 09/20/2015   Procedure: DEBRIDEMENT AND CLOSURE WOUND;  Surgeon: Dayna Barker, MD;  Location: [facility];  Service: Plastics;  Laterality: Right;   EPICARDIAL PACING LEAD PLACEMENT N/A 02/07/2015   eicardial LV lead placed by Dr Laverta Baltimore   LEFT HEART CATH AND CORONARY ANGIOGRAPHY N/A 07/05/2019   Procedure: LEFT  HEART CATH AND CORONARY ANGIOGRAPHY;  Surgeon: Larey Dresser, MD;  Location: [place] [facility];  Service: Cardiovascular;  Laterality: N/A;   LEFT HEART CATHETERIZATION WITH CORONARY ANGIOGRAM N/A 08/04/2013   Procedure: LEFT HEART CATHETERIZATION WITH CORONARY ANGIOGRAM;  Surgeon: Larey Dresser, MD;  Location: [facility] CATH LAB;  Service: Cardiovascular;  Laterality: N/A;   TENDON REPAIR Left 09/20/2015   Procedure: repair nerve tendon wrist;  Surgeon: Dayna Barker, MD;  Location: [facility];  Service: Plastics;  Laterality: Left;    THORACOTOMY Left 02/07/2015   Procedure: THORACOTOMY MAJOR;  Surgeon: Gaye Pollack, MD;  [facility];  Service: Thoracic;  Laterality: Left;    Current Outpatient Medications  Medication Sig Dispense Refill   aspirin 81 MG EC tablet Take 1 tablet (81 mg total) by mouth daily. 90 tablet 2   carvedilol (COREG) 12.5 MG tablet Take 1 tablet (12.5 mg total) by mouth 2 (two) times daily with a meal. 180 tablet 3   doxazosin (CARDURA) 1 MG tablet TAKE 1 TABLET BY MOUTH EVERY DAY 90 tablet 0   naproxen (NAPROSYN) 375 MG tablet Take one tab PO BID with food 20 tablet 0   omeprazole (PRILOSEC) 20 MG capsule TAKE 1 CAPSULE BY MOUTH EVERY DAY 30 capsule 0   sacubitril-valsartan (ENTRESTO) 97-103 MG Take 1 tablet by mouth 2 (two) times daily. 180 tablet 3   spironolactone (ALDACTONE) 25 MG tablet Take 1 tablet (25 mg total) by mouth daily. 90 tablet 3   No current facility-administered medications for this visit.    Allergies:   Patient has no known allergies.   Social History:  The patient  reports that he has been smoking cigarettes. He has a 3.00 pack-year smoking history. He has never used smokeless tobacco. He reports current alcohol use. He reports that he does not currently use drugs after having used the following drugs: "Crack" cocaine.   Family History:  The patient's family history includes Congestive Heart Failure in his mother; Crohn's disease in his sister; Diabetes in his mother; HIV/AIDS in his brother; Heart Problems in his father; Heart attack in his father and mother; Heart disease in his father and mother; Hypertension in his mother; Prostate cancer in his brother.  ROS:  Please see the history of present illness.    All other systems are reviewed and otherwise negative.   PHYSICAL EXAM:  VS:  There were no vitals taken for this visit. BMI: There is no height or weight on file to calculate BMI. Well nourished, well developed, in no acute distress HEENT: normocephalic,  atraumatic Neck: no JVD, carotid bruits or masses Cardiac:  RRR; no significant murmurs, no rubs, or gallops Lungs:  CTA b/l, no wheezing, rhonchi or rales Abd: soft, nontender MS: no deformity or atrophy Ext:  no edema Skin: warm and dry, no rash Neuro:  No gross deficits appreciated Psych: euthymic mood, full affect  ICD site is stable, no tethering or discomfort   EKG:  done today and reviewed by myself SR/VP 94bpm, QRS 45m  Device interrogation done today and reviewed by myself:  Battery reached ERI 09/05/22 Lead measurements are good 94% VP AMS, all available EGMs are reviewed, most are false for PACs, ST/AT that conducts reduces BP% Only a couple are true AFib, and only seconds   10/20/2021: TTE IMPRESSIONS   1. Left ventricular ejection fraction, by estimation, is 40 to 45%. The  left ventricle has mildly decreased function. The left ventricle  demonstrates global hypokinesis.  There is moderate left ventricular  hypertrophy. Left ventricular diastolic  parameters are consistent with Grade I diastolic dysfunction (impaired  relaxation).   2. Right ventricular systolic function is normal. The right ventricular  size is normal. There is normal pulmonary artery systolic pressure. The  estimated right ventricular systolic pressure is 96.2 mmHg.   3. The mitral valve is normal in structure. Mild mitral valve  regurgitation. No evidence of mitral stenosis.   4. The aortic valve is tricuspid. Aortic valve regurgitation is not  visualized. No aortic stenosis is present.   5. The inferior vena cava is normal in size with <50% respiratory  variability, suggesting right atrial pressure of 8 mmHg.    07/05/2019: LHC 1. Anomalous LCx off proximal RCA.  2. No significant coronary disease.    Suspect noncardiac chest pain (it started prior to him using cocaine).    07/04/2019: TTE IMPRESSIONS   1. The left ventricle has mild-moderately reduced systolic function, with  an  ejection fraction of 40-45%. The cavity size was normal. There is mild  concentric left ventricular hypertrophy. Left ventricular diastolic  Doppler parameters are consistent  with impaired relaxation. There is abnormal septal motion consistent with  left bundle branch block. Left ventrical global hypokinesis without  regional wall motion abnormalities.   2. Left atrial size was mildly dilated.   3. Right atrial size was mildly dilated.   4. The aorta is normal in size and structure.   5. When compared to the prior study: 03/11/2018, there appears to be loss  of LV synchrony and decreased left ventricular systolic function. Consider  re-evaluation of CRT.   Recent Labs: 10/20/2021: BUN 13; Creatinine, Ser 1.45; Hemoglobin 15.9; Platelets 267; Potassium 4.3; Sodium 143  10/20/2021: Cholesterol 183; HDL 54; LDL Cholesterol 122; Total CHOL/HDL Ratio 3.4; Triglycerides 34; VLDL 7   CrCl cannot be calculated (Patient's most recent lab result is older than the maximum 21 days allowed.).   Wt Readings from Last 3 Encounters:  11/11/21 208 lb 6.4 oz (94.5 kg)  10/20/21 205 lb 9.6 oz (93.3 kg)  07/26/20 230 lb 3.2 oz (104.4 kg)     Other studies reviewed: Additional studies/records reviewed today include: summarized above  ASSESSMENT AND PLAN:  CRT-D ERI  His echo last year with improved EF to 40%, recommended replacing the device with another CRT-D given young age Discussed generator change procedure, potential risks/benefits, he is agreeable to proceed  NICM Chronic CHF No symptoms or exam findings of voume OL CorVue has been down, though trending upwards and crossing back abouve threshold now (perhaps was his URI) C/w AHF team, he is du to see Dr. Aundra Dubin > he will call today for an appt  HTN Looks OK   Disposition: usual post procedure follow up  Current medicines are reviewed at length with the patient today.  The patient did not have any concerns regarding  medicines.  Venetia Night, PA-C 10/15/2022 7:49 AM     [address] [id number] [phone] (office)  [phone] (fax)

## 2022-10-15 NOTE — Progress Notes (Signed)
Cardiology Office Note Date:  10/15/2022  Patient ID:  Roger Crosby, DOB 1961/10/09, MRN [id number] PCP:  Patient, No Pcp Per  Cardiologist:  Dr. Aundra Dubin Electrophysiologist: Dr. Caryl Comes    Chief Complaint:  RRT  History of Present Illness: Roger Crosby is a 61 y.o. male with history of LBBB, NICM, CRT-D, HTN, GERD, CKD (III)  Last EP visit was with Dr. Caryl Comes back in 02/07/2015 as far as I can find  He saw Dr. Aundra Dubin May 2021, at that time discussed intermittent LTFU 2/2 prison and poor follow up, + cocaine at one time as well spending time in rehab Was at that tie without recurrent drug use. BP was elevated, reported compliance with medicines. Coreg increased, aladactone added Recommended visit with Dr. Radford Pax for CPAP titration.  He saw Dr. Aundra Dubin 10/20/21, significant daytime sleepiness, needs new sleep study, Echo was done and reviewed, showing EF 40-45%, moderate LVH, diffuse hypokinesis, PASP 33, mild MR.   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  Feels like he has good exertional capacity No CP, palpitations or cardiac awareness No SOB, no dizzy spells, near syncope or syncope Denies any shocks from his device CorVue was down, but no exam findings or symptoms of volume OL No changes were made, to c/w HF team  He has not seen [place] or HF since  Device ERI alert  TODAY He is living in [place] now, has gotten a PMD (sees them for the 1st time next week), no cardiology there yet. He has felt well, no CP, palpitations or cardiac awareness. He got a cold about 2 weeks ago, generally feeling better but can't seem to shake the sinus congestion/cough, no fever. No SOB, denies DOE otherwise. No dizzy spells, near syncope or syncope. No shocks   Device  information SJM CRT-D implanted 12/14/2014, LV lead was added 01/2015 (epicardial) RA/RV MDT leads  Past Medical History:  Diagnosis Date   AICD (automatic cardioverter/defibrillator) present    a.  Unsuccessful LV lead placement 11/2014   CAD (coronary artery disease)    a. nonobstructive by [facility] 6/11: oOM1 40%, mild luminal irregs elsewhere b. cath 09/2014L no obstructive disease c. 03/2016: Low-risk NST   CHF (congestive heart failure) (HCC)    CKD (chronic kidney disease)    GERD (gastroesophageal reflux disease)    High cholesterol    HTN (hypertension)    Hx of echocardiogram    echo 5/11: EF 45-50%, mild HK of Ap AS wall, ap Ant wall and true apex (LAD distribution), mild LAE   LBBB (left bundle branch block)    LBP (low back pain)    NICM (nonischemic cardiomyopathy) ([place])    Systolic heart failure ([place])     Past Surgical History:  Procedure Laterality Date   BI-VENTRICULAR IMPLANTABLE CARDIOVERTER DEFIBRILLATOR N/A 12/14/2014   with failed LV lead placeemnt   CARDIAC CATHETERIZATION Left 07/2013   with angiogram   DEBRIDEMENT AND CLOSURE WOUND Right 09/20/2015   Procedure: DEBRIDEMENT AND CLOSURE WOUND;  Surgeon: Dayna Barker, MD;  Location: [place];  Service: Plastics;  Laterality: Right;   EPICARDIAL PACING LEAD PLACEMENT N/A 02/07/2015   eicardial LV lead placed by Dr Laverta Baltimore   LEFT HEART CATH AND CORONARY ANGIOGRAPHY N/A 07/05/2019   Procedure: LEFT  HEART CATH AND CORONARY ANGIOGRAPHY;  Surgeon: Larey Dresser, MD;  Location: [place] CV [facility];  Service: Cardiovascular;  Laterality: N/A;   LEFT HEART CATHETERIZATION WITH CORONARY ANGIOGRAM N/A 08/04/2013   Procedure: LEFT HEART CATHETERIZATION WITH CORONARY ANGIOGRAM;  Surgeon: Larey Dresser, MD;  Location: [facility] CATH LAB;  Service: Cardiovascular;  Laterality: N/A;   TENDON REPAIR Left 09/20/2015   Procedure: repair nerve tendon wrist;  Surgeon: Dayna Barker, MD;  Location: [place];  Service: Plastics;  Laterality: Left;    THORACOTOMY Left 02/07/2015   Procedure: THORACOTOMY MAJOR;  Surgeon: Gaye Pollack, MD;  Location: [facility];  Service: Thoracic;  Laterality: Left;    Current Outpatient Medications  Medication Sig Dispense Refill   aspirin 81 MG EC tablet Take 1 tablet (81 mg total) by mouth daily. 90 tablet 2   carvedilol (COREG) 12.5 MG tablet Take 1 tablet (12.5 mg total) by mouth 2 (two) times daily with a meal. 180 tablet 3   doxazosin (CARDURA) 1 MG tablet TAKE 1 TABLET BY MOUTH EVERY DAY 90 tablet 0   naproxen (NAPROSYN) 375 MG tablet Take one tab PO BID with food 20 tablet 0   omeprazole (PRILOSEC) 20 MG capsule TAKE 1 CAPSULE BY MOUTH EVERY DAY 30 capsule 0   sacubitril-valsartan (ENTRESTO) 97-103 MG Take 1 tablet by mouth 2 (two) times daily. 180 tablet 3   spironolactone (ALDACTONE) 25 MG tablet Take 1 tablet (25 mg total) by mouth daily. 90 tablet 3   No current facility-administered medications for this visit.    Allergies:   Patient has no known allergies.   Social History:  The patient  reports that he has been smoking cigarettes. He has a 3.00 pack-year smoking history. He has never used smokeless tobacco. He reports current alcohol use. He reports that he does not currently use drugs after having used the following drugs: "Crack" cocaine.   Family History:  The patient's family history includes Congestive Heart Failure in his mother; Crohn's disease in his sister; Diabetes in his mother; HIV/AIDS in his brother; Heart Problems in his father; Heart attack in his father and mother; Heart disease in his father and mother; Hypertension in his mother; Prostate cancer in his brother.  ROS:  Please see the history of present illness.    All other systems are reviewed and otherwise negative.   PHYSICAL EXAM:  VS:  There were no vitals taken for this visit. BMI: There is no height or weight on file to calculate BMI. Well nourished, well developed, in no acute distress HEENT: normocephalic,  atraumatic Neck: no JVD, carotid bruits or masses Cardiac:  RRR; no significant murmurs, no rubs, or gallops Lungs:  CTA b/l, no wheezing, rhonchi or rales Abd: soft, nontender MS: no deformity or atrophy Ext:  no edema Skin: warm and dry, no rash Neuro:  No gross deficits appreciated Psych: euthymic mood, full affect  ICD site is stable, no tethering or discomfort   EKG:  done today and reviewed by myself SR/VP 94bpm, QRS 76m  Device interrogation done today and reviewed by myself:  Battery reached ERI 09/05/22 Lead measurements are good 94% VP AMS, all available EGMs are reviewed, most are false for PACs, ST/AT that conducts reduces BP% Only a couple are true AFib, and only seconds   10/20/2021: TTE IMPRESSIONS   1. Left ventricular ejection fraction, by estimation, is 40 to 45%. The  left ventricle has mildly decreased function. The left ventricle  demonstrates global hypokinesis.  There is moderate left ventricular  hypertrophy. Left ventricular diastolic  parameters are consistent with Grade I diastolic dysfunction (impaired  relaxation).   2. Right ventricular systolic function is normal. The right ventricular  size is normal. There is normal pulmonary artery systolic pressure. The  estimated right ventricular systolic pressure is 45.4 mmHg.   3. The mitral valve is normal in structure. Mild mitral valve  regurgitation. No evidence of mitral stenosis.   4. The aortic valve is tricuspid. Aortic valve regurgitation is not  visualized. No aortic stenosis is present.   5. The inferior vena cava is normal in size with <50% respiratory  variability, suggesting right atrial pressure of 8 mmHg.    07/05/2019: LHC 1. Anomalous LCx off proximal RCA.  2. No significant coronary disease.    Suspect noncardiac chest pain (it started prior to him using cocaine).    07/04/2019: TTE IMPRESSIONS   1. The left ventricle has mild-moderately reduced systolic function, with  an  ejection fraction of 40-45%. The cavity size was normal. There is mild  concentric left ventricular hypertrophy. Left ventricular diastolic  Doppler parameters are consistent  with impaired relaxation. There is abnormal septal motion consistent with  left bundle branch block. Left ventrical global hypokinesis without  regional wall motion abnormalities.   2. Left atrial size was mildly dilated.   3. Right atrial size was mildly dilated.   4. The aorta is normal in size and structure.   5. When compared to the prior study: 03/11/2018, there appears to be loss  of LV synchrony and decreased left ventricular systolic function. Consider  re-evaluation of CRT.   Recent Labs: 10/20/2021: BUN 13; Creatinine, Ser 1.45; Hemoglobin 15.9; Platelets 267; Potassium 4.3; Sodium 143  10/20/2021: Cholesterol 183; HDL 54; LDL Cholesterol 122; Total CHOL/HDL Ratio 3.4; Triglycerides 34; VLDL 7   CrCl cannot be calculated (Patient's most recent lab result is older than the maximum 21 days allowed.).   Wt Readings from Last 3 Encounters:  11/11/21 208 lb 6.4 oz (94.5 kg)  10/20/21 205 lb 9.6 oz (93.3 kg)  07/26/20 230 lb 3.2 oz (104.4 kg)     Other studies reviewed: Additional studies/records reviewed today include: summarized above  ASSESSMENT AND PLAN:  CRT-D ERI  His echo last year with improved EF to 40%, recommended replacing the device with another CRT-D given young age Discussed generator change procedure, potential risks/benefits, he is agreeable to proceed  NICM Chronic CHF No symptoms or exam findings of voume OL CorVue has been down, though trending upwards and crossing back abouve threshold now (perhaps was his URI) C/w AHF team, he is du to see Dr. Aundra Dubin > he will call today for an appt  HTN Looks OK   Disposition: usual post procedure follow up  Current medicines are reviewed at length with the patient today.  The patient did not have any concerns regarding  medicines.  Venetia Night, PA-C 10/15/2022 7:49 AM     [address] [place] [address] [phone] (office)  [phone] (fax)

## 2022-10-16 ENCOUNTER — Encounter: Payer: Self-pay | Admitting: *Deleted

## 2022-10-16 ENCOUNTER — Ambulatory Visit: Payer: Medicaid Other | Attending: Student | Admitting: Physician Assistant

## 2022-10-16 ENCOUNTER — Encounter: Payer: Self-pay | Admitting: Physician Assistant

## 2022-10-16 VITALS — BP 132/88 | HR 92 | Ht 68.0 in | Wt 227.0 lb

## 2022-10-16 DIAGNOSIS — I428 Other cardiomyopathies: Secondary | ICD-10-CM | POA: Diagnosis not present

## 2022-10-16 DIAGNOSIS — I10 Essential (primary) hypertension: Secondary | ICD-10-CM

## 2022-10-16 DIAGNOSIS — Z95810 Presence of automatic (implantable) cardiac defibrillator: Secondary | ICD-10-CM | POA: Diagnosis not present

## 2022-10-16 DIAGNOSIS — Z01818 Encounter for other preprocedural examination: Secondary | ICD-10-CM | POA: Diagnosis not present

## 2022-10-16 DIAGNOSIS — Z4502 Encounter for adjustment and management of automatic implantable cardiac defibrillator: Secondary | ICD-10-CM

## 2022-10-16 DIAGNOSIS — I5022 Chronic systolic (congestive) heart failure: Secondary | ICD-10-CM

## 2022-10-16 LAB — CUP PACEART INCLINIC DEVICE CHECK
Battery Remaining Longevity: 0 mo
Brady Statistic RA Percent Paced: 0.52 %
Brady Statistic RV Percent Paced: 94 %
Date Time Interrogation Session: 20231117172740
HighPow Impedance: 73.125
Implantable Lead Connection Status: 753985
Implantable Lead Connection Status: 753985
Implantable Lead Connection Status: 753985
Implantable Lead Implant Date: 20160115
Implantable Lead Implant Date: 20160115
Implantable Lead Implant Date: 20160310
Implantable Lead Location: 753858
Implantable Lead Location: 753859
Implantable Lead Location: 753860
Implantable Lead Model: 5076
Implantable Lead Model: 511212
Implantable Lead Serial Number: 247936
Implantable Pulse Generator Implant Date: 20160115
Lead Channel Impedance Value: 375 Ohm
Lead Channel Impedance Value: 462.5 Ohm
Lead Channel Impedance Value: 587.5 Ohm
Lead Channel Pacing Threshold Amplitude: 0.25 V
Lead Channel Pacing Threshold Amplitude: 0.25 V
Lead Channel Pacing Threshold Amplitude: 0.75 V
Lead Channel Pacing Threshold Amplitude: 0.75 V
Lead Channel Pacing Threshold Amplitude: 1 V
Lead Channel Pacing Threshold Pulse Width: 0.5 ms
Lead Channel Pacing Threshold Pulse Width: 0.5 ms
Lead Channel Pacing Threshold Pulse Width: 0.5 ms
Lead Channel Pacing Threshold Pulse Width: 0.5 ms
Lead Channel Pacing Threshold Pulse Width: 0.5 ms
Lead Channel Sensing Intrinsic Amplitude: 12 mV
Lead Channel Sensing Intrinsic Amplitude: 4 mV
Lead Channel Setting Pacing Amplitude: 2 V
Lead Channel Setting Pacing Amplitude: 2 V
Lead Channel Setting Pacing Amplitude: 2.5 V
Lead Channel Setting Pacing Pulse Width: 0.5 ms
Lead Channel Setting Pacing Pulse Width: 0.5 ms
Lead Channel Setting Sensing Sensitivity: 0.5 mV
Pulse Gen Serial Number: 7209167

## 2022-10-16 NOTE — Patient Instructions (Addendum)
Medication Instructions:   Your physician recommends that you continue on your current medications as directed. Please refer to the Current Medication list given to you today.   *If you need a refill on your cardiac medications before your next appointment, please call your pharmacy*   Lab Work: BMET AND CBC TODAY   If you have labs (blood work) drawn today and your tests are completely normal, you will receive your results only by: [place] (if you have MyChart) OR A paper copy in the mail If you have any lab test that is abnormal or we need to change your treatment, we will call you to review the results.   Testing/Procedures: SEE LETTER FOR GENERATOR CHANGE ON 11-04-2022    Follow-Up: At [facility], you and your health needs are our priority.  As part of our continuing mission to provide you with exceptional heart care, we have created designated Provider Care Teams.  These Care Teams include your primary Cardiologist (physician) and Advanced Practice Providers (APPs -  Physician Assistants and Nurse Practitioners) who all work together to provide you with the care you need, when you need it.  We recommend signing up for the patient portal called "MyChart".  Sign up information is provided on this After Visit Summary.  MyChart is used to connect with patients for Virtual Visits (Telemedicine).  Patients are able to view lab/test results, encounter notes, upcoming appointments, etc.  Non-urgent messages can be sent to your provider as well.   To learn more about what you can do with MyChart, go to [email or web address].    Your next appointment:  AFTER 11-04-2022 10-14 DAYS   DEVICE CLINIC WOUND CHECK   3 month(s)  The format for your next appointment:   In Person  Provider:   Virl Axe, MD    Other Instructions   Important Information About [place]

## 2022-10-16 NOTE — Addendum Note (Signed)
Addended by: Cheri Kearns A on: 10/16/2022 09:28 AM   Modules accepted: Level of Service

## 2022-10-16 NOTE — Progress Notes (Signed)
Remote ICD transmission.   

## 2022-10-17 LAB — CBC
Hematocrit: 44.6 % (ref 37.5–51.0)
Hemoglobin: 14.9 g/dL (ref 13.0–17.7)
MCH: 27.2 pg (ref 26.6–33.0)
MCHC: 33.4 g/dL (ref 31.5–35.7)
MCV: 81 fL (ref 79–97)
Platelets: 302 10*3/uL (ref 150–450)
RBC: 5.48 x10E6/uL (ref 4.14–5.80)
RDW: 14.5 % (ref 11.6–15.4)
WBC: 11.7 10*3/uL — ABNORMAL HIGH (ref 3.4–10.8)

## 2022-10-17 LAB — BASIC METABOLIC PANEL
BUN/Creatinine Ratio: 7 — ABNORMAL LOW (ref 10–24)
BUN: 11 mg/dL (ref 8–27)
CO2: 20 mmol/L (ref 20–29)
Calcium: 9.8 mg/dL (ref 8.6–10.2)
Chloride: 102 mmol/L (ref 96–106)
Creatinine, Ser: 1.56 mg/dL — ABNORMAL HIGH (ref 0.76–1.27)
Glucose: 103 mg/dL — ABNORMAL HIGH (ref 70–99)
Potassium: 4.9 mmol/L (ref 3.5–5.2)
Sodium: 140 mmol/L (ref 134–144)
eGFR: 50 mL/min/{1.73_m2} — ABNORMAL LOW (ref >59)

## 2022-11-03 NOTE — Pre-Procedure Instructions (Signed)
Instructed patient on the following items: Arrival time 0830 Nothing to eat or drink after midnight No meds AM of procedure Responsible person to drive you home and stay with you for 24 hrs Wash with special soap night before and morning of procedure  

## 2022-11-04 ENCOUNTER — Other Ambulatory Visit: Payer: Self-pay

## 2022-11-04 ENCOUNTER — Ambulatory Visit (HOSPITAL_COMMUNITY)
Admission: RE | Admit: 2022-11-04 | Discharge: 2022-11-04 | Disposition: A | Discharge status: Home / Self Care | Payer: Medicaid Other | Attending: Internal Medicine | Admitting: Internal Medicine

## 2022-11-04 ENCOUNTER — Ambulatory Visit (HOSPITAL_COMMUNITY)
Admission: RE | Disposition: A | Discharge status: Home / Self Care | Payer: Self-pay | Source: Home / Self Care | Attending: Internal Medicine

## 2022-11-04 DIAGNOSIS — K219 Gastro-esophageal reflux disease without esophagitis: Secondary | ICD-10-CM | POA: Insufficient documentation

## 2022-11-04 DIAGNOSIS — I429 Cardiomyopathy, unspecified: Secondary | ICD-10-CM | POA: Diagnosis not present

## 2022-11-04 DIAGNOSIS — I5022 Chronic systolic (congestive) heart failure: Secondary | ICD-10-CM | POA: Diagnosis not present

## 2022-11-04 DIAGNOSIS — Z79899 Other long term (current) drug therapy: Secondary | ICD-10-CM | POA: Diagnosis not present

## 2022-11-04 DIAGNOSIS — I130 Hypertensive heart and chronic kidney disease with heart failure and stage 1 through stage 4 chronic kidney disease, or unspecified chronic kidney disease: Secondary | ICD-10-CM | POA: Diagnosis not present

## 2022-11-04 DIAGNOSIS — I428 Other cardiomyopathies: Secondary | ICD-10-CM | POA: Diagnosis not present

## 2022-11-04 DIAGNOSIS — Z4502 Encounter for adjustment and management of automatic implantable cardiac defibrillator: Secondary | ICD-10-CM | POA: Insufficient documentation

## 2022-11-04 DIAGNOSIS — N183 Chronic kidney disease, stage 3 unspecified: Secondary | ICD-10-CM | POA: Insufficient documentation

## 2022-11-04 DIAGNOSIS — I447 Left bundle-branch block, unspecified: Secondary | ICD-10-CM | POA: Insufficient documentation

## 2022-11-04 DIAGNOSIS — F17210 Nicotine dependence, cigarettes, uncomplicated: Secondary | ICD-10-CM | POA: Insufficient documentation

## 2022-11-04 HISTORY — PX: BIV ICD GENERATOR CHANGEOUT: EP1194

## 2022-11-04 SURGERY — BIV ICD GENERATOR CHANGEOUT

## 2022-11-04 MED ORDER — CEFAZOLIN SODIUM-DEXTROSE 2-4 GM/100ML-% IV SOLN
INTRAVENOUS | Status: AC
  Filled 2022-11-04: qty 100

## 2022-11-04 MED ORDER — CHLORHEXIDINE GLUCONATE 4 % EX LIQD: 4.0000 | Freq: Once | CUTANEOUS | Status: DC

## 2022-11-04 MED ORDER — LIDOCAINE HCL (PF) 1 % IJ SOLN
INTRAMUSCULAR | Status: AC
  Filled 2022-11-04: qty 60

## 2022-11-04 MED ORDER — MIDAZOLAM HCL 5 MG/5ML IJ SOLN
INTRAMUSCULAR | Status: AC
  Filled 2022-11-04: qty 5

## 2022-11-04 MED ORDER — FENTANYL CITRATE (PF) 100 MCG/2ML IJ SOLN
INTRAMUSCULAR | Status: DC | PRN
  Administered 2022-11-04: 50 ug via INTRAVENOUS
  Administered 2022-11-04: 25 ug via INTRAVENOUS

## 2022-11-04 MED ORDER — ACETAMINOPHEN 325 MG PO TABS: 325.0000 mg | ORAL_TABLET | ORAL | Status: DC | PRN

## 2022-11-04 MED ORDER — CEFAZOLIN SODIUM-DEXTROSE 2-4 GM/100ML-% IV SOLN
2.0000 g | INTRAVENOUS | Status: AC
  Administered 2022-11-04: 2 g via INTRAVENOUS

## 2022-11-04 MED ORDER — LIDOCAINE HCL (PF) 1 % IJ SOLN
INTRAMUSCULAR | Status: DC | PRN
  Administered 2022-11-04: 50 mL

## 2022-11-04 MED ORDER — SODIUM CHLORIDE 0.9 % IV SOLN: INTRAVENOUS | Status: AC

## 2022-11-04 MED ORDER — SODIUM CHLORIDE 0.9 % IV SOLN: INTRAVENOUS | Status: DC

## 2022-11-04 MED ORDER — POVIDONE-IODINE 10 % EX SWAB: 2.0000 | Freq: Once | CUTANEOUS | Status: DC

## 2022-11-04 MED ORDER — FENTANYL CITRATE (PF) 100 MCG/2ML IJ SOLN
INTRAMUSCULAR | Status: AC
  Filled 2022-11-04: qty 2

## 2022-11-04 MED ORDER — SODIUM CHLORIDE 0.9 % IV SOLN
80.0000 mg | INTRAVENOUS | Status: AC
  Administered 2022-11-04: 80 mg

## 2022-11-04 MED ORDER — MIDAZOLAM HCL 2 MG/2ML IJ SOLN
INTRAMUSCULAR | Status: DC | PRN
  Administered 2022-11-04: 1 mg via INTRAVENOUS
  Administered 2022-11-04: 2 mg via INTRAVENOUS

## 2022-11-04 MED ORDER — SODIUM CHLORIDE 0.9 % IV SOLN
INTRAVENOUS | Status: AC
  Filled 2022-11-04: qty 2

## 2022-11-04 SURGICAL SUPPLY — 5 items
CABLE SURGICAL S-101-97-12 (CABLE) IMPLANT
HEMOSTAT SURGICEL 2X4 FIBR (HEMOSTASIS) IMPLANT
ICD UNIFY ASUR CRT CD3357-40Q (ICD Generator) IMPLANT
PAD DEFIB RADIO PHYSIO CONN (PAD) IMPLANT
TRAY PACEMAKER INSERTION (PACKS) IMPLANT

## 2022-11-04 NOTE — Interval H&P Note (Signed)
History and Physical Interval Note:  11/04/2022 10:19 AM  Roger Crosby  has presented today for surgery, with the diagnosis of ERI.  The various methods of treatment have been discussed with the patient and family. After consideration of risks, benefits and other options for treatment, the patient has consented to  Procedure(s): BIV ICD [place] (N/A) as a surgical intervention.  The patient's history has been reviewed, patient examined, no change in status, stable for surgery.  I have reviewed the patient's chart and labs.  Questions were answered to the patient's satisfaction.     Virl Axe Class 2 CHF  Euvolemic QRSd < 100 m sec, pre implant LBBB>160 msec

## 2022-11-05 ENCOUNTER — Encounter (HOSPITAL_COMMUNITY): Payer: Self-pay | Admitting: Internal Medicine

## 2022-11-05 MED FILL — Midazolam HCl Inj 5 MG/5ML (Base Equivalent): INTRAMUSCULAR | Qty: 3 | Status: AC

## 2022-11-12 ENCOUNTER — Encounter (HOSPITAL_COMMUNITY): Payer: Medicaid Other

## 2022-11-18 ENCOUNTER — Ambulatory Visit: Payer: Medicaid Other | Attending: Internal Medicine

## 2022-11-18 DIAGNOSIS — I5022 Chronic systolic (congestive) heart failure: Secondary | ICD-10-CM | POA: Insufficient documentation

## 2022-11-18 DIAGNOSIS — I447 Left bundle-branch block, unspecified: Secondary | ICD-10-CM | POA: Diagnosis not present

## 2022-11-18 LAB — CUP PACEART INCLINIC DEVICE CHECK
Battery Remaining Longevity: 74 mo
Brady Statistic RA Percent Paced: 0.09 %
Brady Statistic RV Percent Paced: 99 %
Date Time Interrogation Session: 20231220142243
HighPow Impedance: 57.375
Implantable Lead Connection Status: 753985
Implantable Lead Connection Status: 753985
Implantable Lead Connection Status: 753985
Implantable Lead Implant Date: 20160115
Implantable Lead Implant Date: 20160115
Implantable Lead Implant Date: 20160310
Implantable Lead Location: 753858
Implantable Lead Location: 753859
Implantable Lead Location: 753860
Implantable Lead Model: 5076
Implantable Lead Model: 511212
Implantable Lead Serial Number: 247936
Implantable Pulse Generator Implant Date: 20231206
Lead Channel Impedance Value: 362.5 Ohm
Lead Channel Impedance Value: 437.5 Ohm
Lead Channel Impedance Value: 512.5 Ohm
Lead Channel Pacing Threshold Amplitude: 0.5 V
Lead Channel Pacing Threshold Amplitude: 0.5 V
Lead Channel Pacing Threshold Amplitude: 0.5 V
Lead Channel Pacing Threshold Amplitude: 0.5 V
Lead Channel Pacing Threshold Amplitude: 1 V
Lead Channel Pacing Threshold Amplitude: 1 V
Lead Channel Pacing Threshold Pulse Width: 0.5 ms
Lead Channel Pacing Threshold Pulse Width: 0.5 ms
Lead Channel Pacing Threshold Pulse Width: 0.5 ms
Lead Channel Pacing Threshold Pulse Width: 0.5 ms
Lead Channel Pacing Threshold Pulse Width: 0.5 ms
Lead Channel Pacing Threshold Pulse Width: 0.5 ms
Lead Channel Sensing Intrinsic Amplitude: 12 mV
Lead Channel Sensing Intrinsic Amplitude: 4.6 mV
Lead Channel Setting Pacing Amplitude: 2 V
Lead Channel Setting Pacing Amplitude: 2 V
Lead Channel Setting Pacing Amplitude: 2 V
Lead Channel Setting Pacing Pulse Width: 0.5 ms
Lead Channel Setting Pacing Pulse Width: 0.5 ms
Lead Channel Setting Sensing Sensitivity: 0.5 mV
Pulse Gen Serial Number: 5550090

## 2022-11-18 NOTE — Progress Notes (Signed)
Generator change wound check appointment. Steri-strips removed. Wound without redness or edema. Incision edges approximated, wound well healed. Normal device function. Thresholds, sensing, and impedances consistent with implant measurements. Device programmed at chronic settings. Histogram distribution appropriate for patient and level of activity. Brief AMS episodes noted. Patient educated about wound care and shock plan. ROV in 3 months with implanting physician.

## 2022-11-18 NOTE — Patient Instructions (Signed)
After Your ICD (Implantable Cardiac Defibrillator)    Monitor your defibrillator site for redness, swelling, and drainage. Call the device clinic at [phone] if you experience these symptoms or fever/chills.  Your incision was closed with Dermabond:  You may shower 1 day after your defibrillator implant and wash your incision with soap and water. Avoid lotions, ointments, or perfumes over your incision until it is well-healed.  You may use a hot tub or a pool after your wound check appointment if the incision is completely closed.   There are no restrictions in arm movement after your wound check appointment.   Your ICD is designed to protect you from life threatening heart rhythms. Because of this, you may receive a shock.   1 shock with no symptoms:  Call the office during business hours. 1 shock with symptoms (chest pain, chest pressure, dizziness, lightheadedness, shortness of breath, overall feeling unwell):  Call 911. If you experience 2 or more shocks in 24 hours:  Call 911. If you receive a shock, you should not drive.  [place] DMV - no driving for 6 months if you receive appropriate therapy from your ICD.   ICD Alerts:  Some alerts are vibratory and others beep. These are NOT emergencies. Please call our office to let us know. If this occurs at night or on weekends, it can wait until the next business day. Send a remote transmission.  If your device is capable of reading fluid status (for heart failure), you will be offered monthly monitoring to review this with you.   Remote monitoring is used to monitor your ICD from home. This monitoring is scheduled every 91 days by our office. It allows [place] to keep an eye on the functioning of your device to ensure it is working properly. You will routinely see your Electrophysiologist annually (more often if necessary).

## 2022-12-07 NOTE — Progress Notes (Incomplete)
Patient ID: Roger Crosby, male   DOB: 1961-10-23, 62 y.o.   MRN: [id number]  Cardiology: Dr. Aundra Dubin  62 y.o. with history of LBBB and nonischemic cardiomyopathy returns for followup of CHF.  Echo in 2014 showed EF 20-25% with regional wall motion abnormalities.  I did a left heart cath in 9/14 that showed no obstructive disease.  He has a nonischemic cardiomyopathy.  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   He was lost to followup for about 2 years (was in prison).  He presented to [facility] ER in 4/19 with chest pain.  UDS was positive for cocaine.  He says that he "messed up" and was grieving for a death in the family.  No cocaine since, and he had no used any cocaine prior to that since leaving prison.  Echo in 4/19 showed EF 50-55% with mild MR.  Cardiolite in 4/19 showed EF 40% with no perfusion defect (low risk).    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  Echo in 8/20 showed EF 40-45%.    Sleep study was done showing severe OSA.   Echo was done today and reviewed, showing EF 40-45%, moderate LVH, diffuse hypokinesis, PASP 33, mild MR.    Patient returns for followup of CHF.  He has significant daytime sleepiness.  He is going to have to do his sleep study again as it has been too long since his last study to get CPAP.  Rare smoking.  One use of cocaine recently in setting of stress.  No significant dyspnea or chest pain.  No orthopnea/PND.   No palpitations.  ECG (personally reviewed): NSR, BiV pacing  St Jude device interrogation: Stable thoracic impedance, 94% BiV pacing, no AF  Labs (9/14): K 3.9, creatinine 1.6 Labs (10/14): K 4, creatinine 1.3 Labs (4/19): K 3.7, creatinine 1.43, LDL 127, HIV negative Labs (8/20): K 3.8, creatinine 1.52, LDL 141, HIV negative Labs (8/21): K 4, creatinine 1.32  PMH: 1. Hypertension  2. GERD  3. Low back pain  4. LBBB: Newly noted 2011  5. Nonischemic cardiomyopathy:  Echo (5/11): EF 45-50%, mild hypokinesis of the apical anteroseptal wall, apical anterior wall, and true apex (LAD distribution), mild LAE.  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  Echo (6/14) with EF 20-25% with inferior/inferoseptal akinesis and hypokinesis elsewhere.  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  - St Jude CRT-D - Echo (4/19) with EF 50-55%, mild MR. - Cardiolite (4/19) with EF 40%, no significant perfusion defect.  - LHC (8/20): anomalous LCx off the RCA, but no significant CAD.  - Echo (8/20): EF 40-45%.  - Echo (11/22): EF 40-45%, moderate LVH, diffuse hypokinesis, PASP 33 mmHg, mild MR.  6. CKD  7. OSA: Severe on sleep study.   Family History:  Mother deceased CHF,DM,HTN  Father with heart trouble...died age 3.  1 sister Crohn's disease  1 sister healthy  1 brother died Prostate cancer/AIDS...was diagnosed with prostate cancer at age 68.  Father and mother both had MIs in their 27s   Social History:  [employer] information officer, does some landscaping now.   HS graduate.  Married  3 kids  Alcohol use-no. None since 2008  Drug use-prior cocaine but rare since 2008. Denies IVDU.  Prior smoker none since 2008.   Review of Systems  All systems reviewed and negative except as per HPI.  Current Outpatient Medications  Medication Sig Dispense Refill   albuterol (VENTOLIN HFA) 108 (90 Base) MCG/ACT inhaler Inhale 1-2 puffs into the lungs every 6 (six) hours as needed for shortness of breath.     aspirin 81 MG EC tablet Take 1 tablet (81 mg total) by mouth daily. 90 tablet 2   benzonatate (TESSALON) 100 MG capsule Take 100 mg by mouth 3 (three) times daily as needed for cough.     carvedilol (COREG) 12.5 MG tablet Take 1 tablet (12.5 mg total) by mouth 2 (two) times daily with a meal. 180 tablet 3   omeprazole (PRILOSEC) 20 MG capsule TAKE 1 CAPSULE BY MOUTH EVERY DAY 30 capsule 0   sacubitril-valsartan  (ENTRESTO) 97-103 MG Take 1 tablet by mouth 2 (two) times daily. 180 tablet 3   spironolactone (ALDACTONE) 25 MG tablet Take 1 tablet (25 mg total) by mouth daily. 90 tablet 3   tamsulosin (FLOMAX) 0.4 MG CAPS capsule Take 0.4 mg by mouth in the morning.     No current facility-administered medications for this visit.    There were no vitals taken for this visit. General: NAD Neck: No JVD, no thyromegaly or thyroid nodule.  Lungs: Clear to auscultation bilaterally with normal respiratory effort. CV: Nondisplaced PMI.  Heart regular S1/S2, no S3/S4, no murmur.  No peripheral edema.  No carotid bruit.  Normal pedal pulses.  Abdomen: Soft, nontender, no hepatosplenomegaly, no distention.  Skin: Intact without lesions or rashes.  Neurologic: Alert and oriented x 3.  Psych: Normal affect. Extremities: No clubbing or cyanosis.  HEENT: Normal.   Assessment/Plan: 1. Nonischemic cardiomyopathy: Possibly related to cocaine, also HTN may play a role.  No ETOH currently, used cocaine once recently but denies any further use.  EF 20-25% with LBBB in 2014, had St Jude CRT-D device placed with improvement in LV function.  No significant CAD on cath, most recently in 8/20.  NYHA class II symptoms, no volume overload on exam or by Corevue.  4/19 echo showed improvement in EF to 50-55%, and 8/20 echo showed EF back down a bit to 40-45%. Echo today showed stable EF 40-45%.  BP is mildly elevated.  - He has been taking Entresto once daily, increase to 97/103 bid.   - He has been taking Coreg once daily, increase to 12.5 mg bid.  - Continue spironolactone 25 mg daily.   - BMET today and again in 10 days.   2. CKD: Stage 3. BMET today.  3. HTN: Increasing Coreg and Entresto to bid.   4. OSA: Needs a repeat sleep study, will arrange.  5. Cocaine abuse: Strongly urged him to avoid.    Followup 3 months with APP.    [place] 12/07/2022

## 2022-12-08 ENCOUNTER — Encounter (HOSPITAL_COMMUNITY): Payer: Medicaid Other

## 2022-12-18 DIAGNOSIS — I2510 Atherosclerotic heart disease of native coronary artery without angina pectoris: Secondary | ICD-10-CM | POA: Insufficient documentation

## 2022-12-18 DIAGNOSIS — R7303 Prediabetes: Secondary | ICD-10-CM | POA: Insufficient documentation

## 2022-12-18 DIAGNOSIS — F1911 Other psychoactive substance abuse, in remission: Secondary | ICD-10-CM

## 2022-12-18 DIAGNOSIS — N3943 Post-void dribbling: Secondary | ICD-10-CM | POA: Insufficient documentation

## 2022-12-18 HISTORY — DX: Other psychoactive substance abuse, in remission: F19.11

## 2022-12-18 HISTORY — DX: Post-void dribbling: N39.43

## 2022-12-21 ENCOUNTER — Telehealth (HOSPITAL_COMMUNITY): Payer: Self-pay

## 2022-12-21 DIAGNOSIS — R718 Other abnormality of red blood cells: Secondary | ICD-10-CM | POA: Insufficient documentation

## 2022-12-21 DIAGNOSIS — F39 Unspecified mood [affective] disorder: Secondary | ICD-10-CM | POA: Insufficient documentation

## 2022-12-21 DIAGNOSIS — D509 Iron deficiency anemia, unspecified: Secondary | ICD-10-CM

## 2022-12-21 DIAGNOSIS — D751 Secondary polycythemia: Secondary | ICD-10-CM | POA: Insufficient documentation

## 2022-12-21 HISTORY — DX: Iron deficiency anemia, unspecified: D50.9

## 2022-12-21 HISTORY — DX: Other abnormality of red blood cells: R71.8

## 2022-12-21 NOTE — Telephone Encounter (Signed)
Patient called to see if we received fax about transportation from [place]. As of now we do not have and I asked patient to call the company back to re-fax form. He confirmed fax number with me and will call them  If someone gets the form, please call and let him know that we have and will get signed asap. His appointment is Thursday 12/24/22

## 2022-12-22 NOTE — Progress Notes (Incomplete)
Patient ID: Roger Crosby, male   DOB: 10-03-1961, 62 y.o.   MRN: [id number]  Cardiology: Dr. Aundra Dubin  62 y.o. with history of LBBB, nonischemic cardiomyopathy, severe OSA, HTN, CKD IIIa, GERD, hx substance abuse.  Echo in 2014 showed EF 20-25% with regional wall motion abnormalities.  [place] 9/14 showed no obstructive disease.  He has a nonischemic cardiomyopathy.  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   He was lost to followup for about 2 years (was in prison).  He presented to [facility] ER in 04/15/2023 with chest pain.  UDS was positive for cocaine.  He says that he "messed up" and was grieving for a death in the family.  Echo in 2023/04/15 showed EF 50-55% with mild MR.  Cardiolite in 04/15/23 showed EF 40% with no perfusion defect (low risk).    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  Echo in 8/20 showed EF 40-45%.    Echo 11/22: EF 40-45%, moderate LVH, diffuse hypokinesis, PASP 33, mild MR.    Has not been seen in HF clinic since 11/22. At that time, he was only taking coreg and entresto once a day, both meds increased to BID. Repeat sleep study arranged. Now living in [place]. Underwent ICD gen change here at [facility] in 12/23.    ECG (personally reviewed):  St Jude device interrogation:     PMH: 1. Hypertension  2. GERD  3. Low back pain  4. LBBB: Newly noted 2011  5. Nonischemic cardiomyopathy: Echo (5/11): EF 45-50%, mild hypokinesis of the apical anteroseptal wall, apical anterior wall, and true apex (LAD distribution), mild LAE.  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  Echo (6/14) with EF 20-25% with inferior/inferoseptal akinesis and hypokinesis elsewhere.  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  - St Jude CRT-D - Echo 15-Apr-2023) with EF 50-55%, mild MR. - Cardiolite Apr 15, 2023) with EF 40%, no significant perfusion defect.  - LHC (8/20): anomalous LCx off the RCA, but no  significant CAD.  - Echo (8/20): EF 40-45%.  - Echo (11/22): EF 40-45%, moderate LVH, diffuse hypokinesis, PASP 33 mmHg, mild MR.  6. CKD IIIa 7. OSA: Severe on sleep study.  8. Hx cocaine abuse  Family History:  Mother deceased CHF,DM,HTN  Father with heart trouble...died age 51.  1 sister Crohn's disease  1 sister healthy  1 brother died Prostate cancer/AIDS...was diagnosed with prostate cancer at age 46.  Father and mother both had MIs in their 79s   Social History:  [employer] information officer, does some landscaping now.   HS graduate.  Married  3 kids  Alcohol use-no. None since 2008  Drug use-prior cocaine but rare since 2008. Denies IVDU.  Prior smoker none since 2008.   Review of Systems  All systems reviewed and negative except as per HPI.   Current Outpatient Medications  Medication Sig Dispense Refill   albuterol (VENTOLIN HFA) 108 (90 Base) MCG/ACT inhaler Inhale 1-2 puffs into the lungs every 6 (six) hours as needed for shortness of breath.     aspirin 81 MG EC tablet Take 1 tablet (81 mg total) by mouth daily. 90 tablet 2   benzonatate (TESSALON) 100 MG capsule Take 100 mg by mouth 3 (three) times daily as needed for cough.     carvedilol (COREG) 12.5 MG tablet Take 1 tablet (12.5 mg total) by mouth 2 (two) times  daily with a meal. 180 tablet 3   omeprazole (PRILOSEC) 20 MG capsule TAKE 1 CAPSULE BY MOUTH EVERY DAY 30 capsule 0   sacubitril-valsartan (ENTRESTO) 97-103 MG Take 1 tablet by mouth 2 (two) times daily. 180 tablet 3   spironolactone (ALDACTONE) 25 MG tablet Take 1 tablet (25 mg total) by mouth daily. 90 tablet 3   tamsulosin (FLOMAX) 0.4 MG CAPS capsule Take 0.4 mg by mouth in the morning.     No current facility-administered medications for this visit.    There were no vitals taken for this visit. General: NAD Neck: No JVD, no thyromegaly or thyroid nodule.  Lungs: Clear to auscultation bilaterally with normal respiratory  effort. CV: Nondisplaced PMI.  Heart regular S1/S2, no S3/S4, no murmur.  No peripheral edema.  No carotid bruit.  Normal pedal pulses.  Abdomen: Soft, nontender, no hepatosplenomegaly, no distention.  Skin: Intact without lesions or rashes.  Neurologic: Alert and oriented x 3.  Psych: Normal affect. Extremities: No clubbing or cyanosis.  HEENT: Normal.   Assessment/Plan: 1. Nonischemic cardiomyopathy: Possibly related to cocaine, also HTN may play a role.  No ETOH or cocaine recently.  EF 20-25% with LBBB in 2014, had St Jude CRT-D device placed with improvement in LV function.  No significant CAD on cath, most recently in 8/20.  4/19 echo showed improvement in EF to 50-55%, and 8/20 echo showed EF back down a bit to 40-45%. Echo 11/22 showed stable EF 40-45%.   - NYHA ***. Volume *** - He has been taking Entresto once daily, increase to 97/103 bid.   - He has been taking Coreg once daily, increase to 12.5 mg bid.  - Continue spironolactone 25 mg daily.   - BMET today and again in 10 days.   2. CKD: Stage 3a. BMET today.  3. HTN: *** 4. OSA: Needs a repeat sleep study.  5. Cocaine abuse: Strongly urged him to avoid.    Followup 3 months with APP.    Columbia Pandey N 12/22/2022

## 2022-12-23 ENCOUNTER — Telehealth (HOSPITAL_COMMUNITY): Payer: Self-pay

## 2022-12-23 ENCOUNTER — Telehealth (HOSPITAL_COMMUNITY): Payer: Self-pay | Admitting: Licensed Clinical Social Worker

## 2022-12-23 NOTE — Telephone Encounter (Signed)
Hey, were you working on his transportation form?  He has left a message asking if this has been taken care of for his appointment. Can you call?

## 2022-12-23 NOTE — Telephone Encounter (Signed)
H&V Care Navigation CSW Progress Note  Clinical Social Worker contacted patient by phone to assist with transportation.  Patient is participating in a Managed Medicaid Plan:  no   SDOH Screenings   Transportation Needs: Unmet Transportation Needs (12/23/2022)  Depression (PHQ2-9): Low Risk  (07/26/2020)  Tobacco Use: High Risk (11/05/2022)   CSW assisted with transportation paperwork for [place] services for patient to obtain transportation to upcoming appointment. Patient states that he will need to reschedule tomorrows appointment as he needs 5-7 business days to schedule transport. Patient rescheduled for 01-05-23 at 10:30 am. Raquel Sarna, Marlinda Mike, [place]

## 2022-12-23 NOTE — Telephone Encounter (Signed)
I spoke to [place] and gave him an update on everything. He is agreeable and understands.

## 2022-12-24 ENCOUNTER — Encounter (HOSPITAL_COMMUNITY): Payer: Self-pay

## 2023-01-04 ENCOUNTER — Telehealth (HOSPITAL_COMMUNITY): Payer: Self-pay

## 2023-01-04 NOTE — Telephone Encounter (Signed)
Left message to confirm appointment for 01/05/23

## 2023-01-05 ENCOUNTER — Telehealth (HOSPITAL_COMMUNITY): Payer: Self-pay

## 2023-01-05 ENCOUNTER — Telehealth: Payer: Self-pay

## 2023-01-05 ENCOUNTER — Other Ambulatory Visit (HOSPITAL_COMMUNITY): Payer: Self-pay

## 2023-01-05 ENCOUNTER — Ambulatory Visit (HOSPITAL_COMMUNITY)
Admission: RE | Admit: 2023-01-05 | Discharge: 2023-01-05 | Disposition: A | Discharge status: Home / Self Care | Payer: Medicaid Other | Source: Ambulatory Visit | Attending: Family Medicine | Admitting: Family Medicine

## 2023-01-05 ENCOUNTER — Encounter (HOSPITAL_COMMUNITY): Payer: Self-pay

## 2023-01-05 ENCOUNTER — Telehealth: Payer: Self-pay | Admitting: Internal Medicine

## 2023-01-05 VITALS — BP 130/78 | HR 82 | Wt 237.8 lb

## 2023-01-05 DIAGNOSIS — F1410 Cocaine abuse, uncomplicated: Secondary | ICD-10-CM | POA: Insufficient documentation

## 2023-01-05 DIAGNOSIS — I130 Hypertensive heart and chronic kidney disease with heart failure and stage 1 through stage 4 chronic kidney disease, or unspecified chronic kidney disease: Secondary | ICD-10-CM | POA: Diagnosis not present

## 2023-01-05 DIAGNOSIS — N183 Chronic kidney disease, stage 3 unspecified: Secondary | ICD-10-CM

## 2023-01-05 DIAGNOSIS — N400 Enlarged prostate without lower urinary tract symptoms: Secondary | ICD-10-CM | POA: Insufficient documentation

## 2023-01-05 DIAGNOSIS — R0602 Shortness of breath: Secondary | ICD-10-CM | POA: Diagnosis not present

## 2023-01-05 DIAGNOSIS — Z95810 Presence of automatic (implantable) cardiac defibrillator: Secondary | ICD-10-CM | POA: Insufficient documentation

## 2023-01-05 DIAGNOSIS — G4733 Obstructive sleep apnea (adult) (pediatric): Secondary | ICD-10-CM | POA: Diagnosis not present

## 2023-01-05 DIAGNOSIS — I447 Left bundle-branch block, unspecified: Secondary | ICD-10-CM | POA: Diagnosis not present

## 2023-01-05 DIAGNOSIS — I5022 Chronic systolic (congestive) heart failure: Secondary | ICD-10-CM | POA: Diagnosis present

## 2023-01-05 DIAGNOSIS — I10 Essential (primary) hypertension: Secondary | ICD-10-CM

## 2023-01-05 DIAGNOSIS — N1831 Chronic kidney disease, stage 3a: Secondary | ICD-10-CM | POA: Diagnosis not present

## 2023-01-05 DIAGNOSIS — K219 Gastro-esophageal reflux disease without esophagitis: Secondary | ICD-10-CM | POA: Diagnosis not present

## 2023-01-05 DIAGNOSIS — Z7982 Long term (current) use of aspirin: Secondary | ICD-10-CM | POA: Insufficient documentation

## 2023-01-05 DIAGNOSIS — Z79899 Other long term (current) drug therapy: Secondary | ICD-10-CM | POA: Diagnosis not present

## 2023-01-05 DIAGNOSIS — I428 Other cardiomyopathies: Secondary | ICD-10-CM | POA: Insufficient documentation

## 2023-01-05 DIAGNOSIS — I2510 Atherosclerotic heart disease of native coronary artery without angina pectoris: Secondary | ICD-10-CM | POA: Diagnosis not present

## 2023-01-05 DIAGNOSIS — F1411 Cocaine abuse, in remission: Secondary | ICD-10-CM

## 2023-01-05 LAB — BASIC METABOLIC PANEL
Anion gap: 10 (ref 5–15)
BUN: 12 mg/dL (ref 8–23)
CO2: 24 mmol/L (ref 22–32)
Calcium: 8.8 mg/dL — ABNORMAL LOW (ref 8.9–10.3)
Chloride: 107 mmol/L (ref 98–111)
Creatinine, Ser: 1.25 mg/dL — ABNORMAL HIGH (ref 0.61–1.24)
GFR, Estimated: >60 mL/min (ref >60)
Glucose, Bld: 132 mg/dL — ABNORMAL HIGH (ref 70–99)
Potassium: 3.8 mmol/L (ref 3.5–5.1)
Sodium: 141 mmol/L (ref 135–145)

## 2023-01-05 LAB — BRAIN NATRIURETIC PEPTIDE: B Natriuretic Peptide: 14.9 pg/mL (ref 0.0–100.0)

## 2023-01-05 MED ORDER — EMPAGLIFLOZIN 10 MG PO TABS: 10.0000 mg | ORAL_TABLET | Freq: Every day | ORAL | 8 refills | Status: DC

## 2023-01-05 MED ORDER — CARVEDILOL 12.5 MG PO TABS: 18.7500 mg | ORAL_TABLET | Freq: Two times a day (BID) | ORAL | 8 refills | Status: DC

## 2023-01-05 NOTE — Telephone Encounter (Signed)
I called the patient to help with his monitor but he is not home. I gave him the device clinic number to call for help.

## 2023-01-05 NOTE — Patient Instructions (Addendum)
Thank you for coming in today  Labs were done today, if any labs are abnormal the clinic will call you No news is good news  EKG was done today  INCREASE Coreg to 18.75 mg twice daily  START Jardiance 10 mg 1 tablet daily   Your physician recommends that you schedule a follow-up appointment in:  3-4 months with Dr. Aundra Crosby with echocardiogram You will receive a reminder letter in the mail a few months in advance. If you don't receive a letter, please call our office to schedule the follow-up appointment.   Your physician has requested that you have an echocardiogram. Echocardiography is a painless test that uses sound waves to create images of your heart. It provides your doctor with information about the size and shape of your heart and how well your heart's chambers and valves are working. This procedure takes approximately one hour. There are no restrictions for this procedure.     Do the following things EVERYDAY: Weigh yourself in the morning before breakfast. Write it down and keep it in a log. Take your medicines as prescribed Eat low salt foods--Limit salt (sodium) to 2000 mg per day.  Stay as active as you can everyday Limit all fluids for the day to less than 2 liters At the [facility], you and your health needs are our priority. As part of our continuing mission to provide you with exceptional heart care, we have created designated Provider Care Teams. These Care Teams include your primary Cardiologist (physician) and Advanced Practice Providers (APPs- Physician Assistants and Nurse Practitioners) who all work together to provide you with the care you need, when you need it.   You may see any of the following providers on your designated Care Team at your next follow up: Dr Glori Bickers Dr [name] Dr. Roxana Hires, NP Lyda Jester, [place] [facility] [place], [place] Forestine Na, NP Audry Riles, PharmD   Please be sure  to bring in all your medications bottles to every appointment.    Thank you for choosing [facility]  If you have any questions or concerns before your next appointment please send [place] a message through [place] or call our office at [phone].    TO LEAVE A MESSAGE FOR THE NURSE SELECT OPTION 2, PLEASE LEAVE A MESSAGE INCLUDING: YOUR NAME DATE OF BIRTH CALL BACK NUMBER REASON FOR CALL**this is important as we prioritize the call backs  YOU WILL RECEIVE A CALL BACK THE SAME DAY AS LONG AS YOU CALL BEFORE 4:00 PM

## 2023-01-05 NOTE — Telephone Encounter (Signed)
Spoke with patient and advised I received message from [place] in HF clinic today after his visit.  Explained Janett Billow would like to recheck device report next week but his monitor is showing as disconnected.  Advised to send manual transmission today to reconnect the device and monitor and he agreed.  He knows how to send manual transmission.

## 2023-01-05 NOTE — Progress Notes (Signed)
Patient ID: Roger Crosby, male   DOB: May 09, 1961, 62 y.o.   MRN: [id number]  PCP: Catalina Lunger, NP [place], [place]) Cardiology: Dr. Aundra Dubin  62 y.o. with history of LBBB, nonischemic cardiomyopathy, severe OSA, HTN, CKD IIIa, GERD, hx substance abuse.  Echo in 2014 showed EF 20-25% with regional wall motion abnormalities.  [place] 9/14 showed no obstructive disease.  He has a nonischemic cardiomyopathy.  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   He was lost to followup for about 2 years (was in prison).  He presented to [facility] ER in Apr 10, 2023 with chest pain.  UDS was positive for cocaine.  He says that he "messed up" and was grieving for a death in the family.  Echo in 04-10-2023 showed EF 50-55% with mild MR.  Cardiolite in 2023/04/10 showed EF 40% with no perfusion defect (low risk).    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  Echo in 8/20 showed EF 40-45%.    Echo 11/22: EF 40-45%, moderate LVH, diffuse hypokinesis, PASP 33, mild MR.    Has not been seen in HF clinic since 11/22. At that time, he was only taking Coreg and Entresto once a day, both meds increased to BID. Repeat sleep study arranged. Underwent ICD gen change here at [facility] in 12/23.   Today he returns for HF follow up. Overall feeling fine. He has noticed more SOB going up steps but attributes this to his weight gain. Feels occasional chest discomfort, he attributes this to acid reflux as if resolves when he takes his PPI. Denies palpitations, abnormal bleeding, dizziness, edema, or PND/Orthopnea. Appetite ok. No fever or chills. Weight at home 230 pounds. Taking all medications. Now living in [place] but planning on moving to [place], [place] soon. Has transportation issues. No cocaine use > 1 year. PCP arranging repeat sleep study. Planning on TURP soon  ECG (personally reviewed): NSR, V-paced   St Jude device interrogation (personally reviewed): unable to interrogate device  today.  Labs 2023/04/10): K 3.7, creatinine 1.43, LDL 127, HIV negative Labs (8/20): K 3.8, creatinine 1.52, LDL 141, HIV negative Labs (8/21): K 4, creatinine 1.32 Labs (11/23): K 4.9, creatinine 1.56  PMH: 1. Hypertension  2. GERD  3. Low back pain  4. LBBB: Newly noted 2011  5. Nonischemic cardiomyopathy: Echo (5/11): EF 45-50%, mild hypokinesis of the apical anteroseptal wall, apical anterior wall, and true apex (LAD distribution), mild LAE.  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  Echo (6/14) with EF 20-25% with inferior/inferoseptal akinesis and hypokinesis elsewhere.  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  - St Jude CRT-D - Echo 04-10-2023) with EF 50-55%, mild MR. - Cardiolite 04/10/2023) with EF 40%, no significant perfusion defect.  - LHC (8/20): anomalous LCx off the RCA, but no significant CAD.  - Echo (8/20): EF 40-45%.  - Echo (11/22): EF 40-45%, moderate LVH, diffuse hypokinesis, PASP 33 mmHg, mild MR.  6. CKD IIIa 7. OSA: Severe on sleep study.  8. Hx cocaine abuse  Family History:  Mother deceased CHF,DM,HTN  Father with heart trouble...died age 25.  1 sister Crohn's disease  1 sister healthy  1 brother died Prostate cancer/AIDS...was diagnosed with prostate cancer at age 62.  Father and mother both had MIs in their 70s   Social History:  [employer] information officer, does some landscaping now.   HS graduate.  Married  3 kids  Alcohol use-no. None since 2008  Drug use-prior cocaine but rare since 2008. Denies IVDU.  Prior smoker none since 2008.   Review of Systems  All systems reviewed and negative except as per HPI.   Current Outpatient Medications  Medication Sig Dispense Refill   aspirin 81 MG EC tablet Take 1 tablet (81 mg total) by mouth daily. 90 tablet 2   atorvastatin (LIPITOR) 20 MG tablet Take 1 tablet every day by oral route at bedtime for 90 days.     carvedilol (COREG) 12.5 MG tablet Take  1 tablet (12.5 mg total) by mouth 2 (two) times daily with a meal. 180 tablet 3   omeprazole (PRILOSEC) 20 MG capsule TAKE 1 CAPSULE BY MOUTH EVERY DAY 30 capsule 0   sacubitril-valsartan (ENTRESTO) 97-103 MG Take 1 tablet by mouth 2 (two) times daily. 180 tablet 3   spironolactone (ALDACTONE) 25 MG tablet Take 1 tablet (25 mg total) by mouth daily. 90 tablet 3   tamsulosin (FLOMAX) 0.4 MG CAPS capsule Take 0.4 mg by mouth in the morning.     No current facility-administered medications for this encounter.   Wt Readings from Last 3 Encounters:  01/05/23 107.9 kg (237 lb 12.8 oz)  11/04/22 102.1 kg (225 lb)  10/16/22 103 kg (227 lb)   BP 130/78   Pulse 82   Wt 107.9 kg (237 lb 12.8 oz)   SpO2 97%   BMI 36.16 kg/m  Physical Exam General:  NAD. No resp difficulty, walked into clinic HEENT: Normal Neck: Supple. JVP 7-8. Carotids 2+ bilat; no bruits. No lymphadenopathy or thryomegaly appreciated. Cor: PMI nondisplaced. Regular rate & rhythm. No rubs, gallops or murmurs. Lungs: Clear Abdomen: Soft, obese, nontender, nondistended. No hepatosplenomegaly. No bruits or masses. Good bowel sounds. Extremities: No cyanosis, clubbing, rash, edema Neuro: Alert & oriented x 3, cranial nerves grossly intact. Moves all 4 extremities w/o difficulty. Affect pleasant.  Assessment/Plan: 1. Nonischemic cardiomyopathy: Possibly related to cocaine, also HTN may play a role.  No ETOH or cocaine recently.  EF 20-25% with LBBB in 2014, had St Jude CRT-D device placed with improvement in LV function.  No significant CAD on cath, most recently in 8/20.  Echo (4/19) showed improvement in EF to 50-55%, and 8/20 echo showed EF back down a bit to 40-45%. Echo 11/22 showed stable EF 40-45%.  Today, NYHA II-early III, he is mildly volume overloaded on exam today and weight is up. - Start Jardiance 10 mg daily. We discussed potential side effect. BMET and BNP today. - Increase Coreg to 18.75 mg bid. - Continue Entresto  97/103 mg bid.   - Continue spironolactone 25 mg daily.   - Repeat echo next visit 2. CKD: Stage 3a. BMET today.  - Start SGLT2i as above. 3. HTN: Elevated.  - Medication changes as above. 4. OSA: Needs a repeat sleep study, PCP arranging. 5. Cocaine abuse: Strongly urged him to avoid. Last used cocaine about a year ago. 6. BPH: Planning for TURP next week.  Follow up 3-4 months with Dr. Aundra Dubin + echo.  Maricela Bo [facility] FNP-BC 01/05/2023

## 2023-01-05 NOTE — Telephone Encounter (Signed)
Pt states he is unable to send manual transmission as asked in previous phone note. Pt states it is blinking different colored lights. Please advise.

## 2023-01-05 NOTE — Telephone Encounter (Signed)
Advanced Heart Failure Patient Advocate Encounter  Prior authorization is required for Jardiance. PA submitted and APPROVED on 01/05/23. Test billing results in $4 copay  [facility] Ii Tracks [id number] W Effective: 01/05/23 - 01/05/24  Clista Bernhardt, CPhT Rx Patient Advocate Phone: [phone]

## 2023-01-06 NOTE — Telephone Encounter (Signed)
I called the patient but he was not home. I agreed to give him a call first thing in the morning.

## 2023-01-07 NOTE — Telephone Encounter (Signed)
I called [employer] tech support and new ICD was not updated in [employer]. Once the st jude rep updated the ICD information the monitor worked. Transmission received 01/07/2023.

## 2023-01-09 ENCOUNTER — Telehealth: Payer: Self-pay | Admitting: Physician Assistant

## 2023-01-09 NOTE — Telephone Encounter (Addendum)
   The patient called the answering service after-hours today - helping with pages. We received notification that patient's device was humming. There were recent notes to the office where patient was having difficulty getting his monitor to transmit. We paged Karilyn Cota, rep for St. Jude. He states they received the transmission from 2/8 though it looks like he was subsequently "released" from our monitoring database. Gaspar Bidding is going to call the patient to try to troubleshoot the issue.  Addendum: Per STJ rep, has [employer] lead placed in 2016. ICD lead is fine, epicardial lead is fine but at 10:20am he had some noise picked up on atrial lead and he got an impedence that shot up from 440 to 2175, which seems to be c/w conductor fracture. Threshold is excellent and sensing is good - as long as he senses he is tracking appropriately and BiV Pacing with ICD functionality. Per d/w Dr. Caryl Comes on 3 way call, this is not an emergent issue but does need early outpatient f/u with device clinic to discuss next steps (I.e. next 1-2 weeks per Dr. Caryl Comes). Will forward to device clinic and Dr. Olin Pia nurse to help make sure this gets arranged. I circled back to patient and told him to expect a call from the office to arrange f/u.  Charlie Pitter, PA-C

## 2023-01-11 NOTE — Telephone Encounter (Signed)
Outreach made to Pt.  He is unable to come in this week or early next week (other appointments).    Pt scheduled for 01/22/23 with device clinic to evaluate.

## 2023-01-12 ENCOUNTER — Telehealth (HOSPITAL_COMMUNITY): Payer: Self-pay

## 2023-01-12 NOTE — Telephone Encounter (Signed)
Patient called  and stated his urology office needs surgical clearance.  I asked him to call them to have them resend fax for clearance just in case we do not have them.

## 2023-01-14 ENCOUNTER — Ambulatory Visit (INDEPENDENT_AMBULATORY_CARE_PROVIDER_SITE_OTHER): Payer: Medicaid Other

## 2023-01-14 DIAGNOSIS — I5022 Chronic systolic (congestive) heart failure: Secondary | ICD-10-CM | POA: Diagnosis not present

## 2023-01-14 DIAGNOSIS — Z95810 Presence of automatic (implantable) cardiac defibrillator: Secondary | ICD-10-CM

## 2023-01-14 NOTE — Telephone Encounter (Signed)
LATE ENTRY: Received a medical clearance form from [facility] , requesting Cardiology clearance for the following procedure: transurethral resection of prostate. Medical clearance form was signed by Dr. [name] and successfully faxed to [phone] on Tuesday, February 13,. Form will be scanned into patients chart. PATIENT WAS NOT CLEARED FOR SURGERY

## 2023-01-14 NOTE — Progress Notes (Signed)
EPIC Encounter for ICM Monitoring  Patient Name: Roger Crosby is a 62 y.o. male Date: 01/14/2023 Primary Care Physican: Patient, No Pcp Per Primary Cardiologist: Aundra Dubin Electrophysiologist: Vergie Living Pacing: 98% 01/05/2023 Office Weight: 237 lbs  01/14/2023 Weight: 232 lbs        HF clinic check.  Spoke with patient.  Heart Failure questions reviewed.  Pt asymptomatic for fluid accumulation and decrease in weight since 2/6 OV.   CorVue thoracic impedance ranging from normal to possible dryness since 01/05/2023 HF clinic OV.      Prescribed:  Spironolactone 25 mg take 1 tablet daily  Recommendations: Encouraged to call if experiencing fluid symptoms.  Copy sent to [facility], NP as requested for follow up after 2/6 OV.    Follow-up plan: No further ICM clinic phone appointments at this time.   91 day device clinic remote transmission 02/04/2023.    EP/Cardiology Office Visits: 02/17/2023 with Dr. Caryl Comes.    Copy of ICM check sent to Dr. Caryl Comes.   3 month ICM trend: 01/14/2023.    12-14 Month ICM trend:     Rosalene Billings, RN 01/14/2023 10:48 AM

## 2023-01-15 ENCOUNTER — Telehealth: Payer: Self-pay | Admitting: Internal Medicine

## 2023-01-15 NOTE — Telephone Encounter (Signed)
Patient is calling stating social services transportation has faxed a form to the office that is needing to be completed and faxed back for the patient to have transportation to his upcoming appt on 02/23. He is wanting to confirm it was received. The office informed him it was just faxed. Please advise.

## 2023-01-15 NOTE — Telephone Encounter (Signed)
Spoke with pt and advised transportation paperwork has been completed and faxed back to [facility].  Pt verbalizes understanding and thanked Therapist, sports for the call.

## 2023-01-18 ENCOUNTER — Telehealth (HOSPITAL_COMMUNITY): Payer: Self-pay

## 2023-01-18 ENCOUNTER — Encounter: Payer: Medicaid Other | Admitting: Internal Medicine

## 2023-01-18 NOTE — Telephone Encounter (Signed)
Roger Crosby from [facility] called back about surgical clearance still not received. The one that was faxed to them last week was from 12/25/22 and was denied because had not been seen. He was seen on the 6th and they sent a revised clearance - but never got it back so his surgery was cancelled. I called Roger Crosby back and asked her to fax back to my attention and I will get it signed for her and send back.

## 2023-01-18 NOTE — Telephone Encounter (Signed)
Patient called from surgical center in [place] stating no one received any surgical clearance information. Can you follow up on this

## 2023-01-19 ENCOUNTER — Encounter (HOSPITAL_COMMUNITY): Payer: Self-pay

## 2023-01-19 NOTE — Telephone Encounter (Signed)
Per Larose Kells, cma Dr. [name] has cleared patient for surgery and clearance was faxed over

## 2023-01-22 ENCOUNTER — Ambulatory Visit: Payer: Medicaid Other | Attending: Cardiology

## 2023-01-22 DIAGNOSIS — Z95810 Presence of automatic (implantable) cardiac defibrillator: Secondary | ICD-10-CM | POA: Diagnosis not present

## 2023-01-22 DIAGNOSIS — I5022 Chronic systolic (congestive) heart failure: Secondary | ICD-10-CM | POA: Insufficient documentation

## 2023-01-22 LAB — CUP PACEART INCLINIC DEVICE CHECK
Date Time Interrogation Session: 20240223102644
Implantable Lead Connection Status: 753985
Implantable Lead Connection Status: 753985
Implantable Lead Connection Status: 753985
Implantable Lead Implant Date: 20160115
Implantable Lead Implant Date: 20160115
Implantable Lead Implant Date: 20160310
Implantable Lead Location: 753858
Implantable Lead Location: 753859
Implantable Lead Location: 753860
Implantable Lead Model: 5076
Implantable Lead Model: 511212
Implantable Lead Serial Number: 247936
Implantable Pulse Generator Implant Date: 20231206
Pulse Gen Serial Number: 5550090

## 2023-01-22 NOTE — Progress Notes (Signed)
In clinic add on d/t increase in atrial lead impedance. Per review of Pt history, this has happened before. Atrial lead tested and WNL.  Unable to recreate atrial lead noise with isometric exercises. Alarm for atrial lead impedance out of range disabled for Pt, but still available as an alert via remote check. Follow up scheduled.

## 2023-01-22 NOTE — Patient Instructions (Addendum)
Follow up as scheduled.  

## 2023-02-04 ENCOUNTER — Telehealth (HOSPITAL_COMMUNITY): Payer: Self-pay

## 2023-02-04 ENCOUNTER — Ambulatory Visit: Payer: Medicaid Other

## 2023-02-04 DIAGNOSIS — I428 Other cardiomyopathies: Secondary | ICD-10-CM | POA: Diagnosis not present

## 2023-02-04 LAB — CUP PACEART REMOTE DEVICE CHECK
Battery Remaining Longevity: 77 mo
Battery Remaining Percentage: 93 %
Battery Voltage: 3.17 V
Brady Statistic AP VP Percent: 1 %
Brady Statistic AP VS Percent: 1 %
Brady Statistic AS VP Percent: 97 %
Brady Statistic AS VS Percent: 2.3 %
Brady Statistic RA Percent Paced: 1 %
Date Time Interrogation Session: 20240307112021
HighPow Impedance: 61 Ohm
HighPow Impedance: 61 Ohm
Implantable Lead Connection Status: 753985
Implantable Lead Connection Status: 753985
Implantable Lead Connection Status: 753985
Implantable Lead Implant Date: 20160115
Implantable Lead Implant Date: 20160115
Implantable Lead Implant Date: 20160310
Implantable Lead Location: 753858
Implantable Lead Location: 753859
Implantable Lead Location: 753860
Implantable Lead Model: 5076
Implantable Lead Model: 511212
Implantable Lead Serial Number: 247936
Implantable Pulse Generator Implant Date: 20231206
Lead Channel Impedance Value: 360 Ohm
Lead Channel Impedance Value: 430 Ohm
Lead Channel Impedance Value: 560 Ohm
Lead Channel Pacing Threshold Amplitude: 0.5 V
Lead Channel Pacing Threshold Amplitude: 0.5 V
Lead Channel Pacing Threshold Amplitude: 1 V
Lead Channel Pacing Threshold Pulse Width: 0.5 ms
Lead Channel Pacing Threshold Pulse Width: 0.5 ms
Lead Channel Pacing Threshold Pulse Width: 0.5 ms
Lead Channel Sensing Intrinsic Amplitude: 12 mV
Lead Channel Sensing Intrinsic Amplitude: 3.6 mV
Lead Channel Setting Pacing Amplitude: 2 V
Lead Channel Setting Pacing Amplitude: 2 V
Lead Channel Setting Pacing Amplitude: 2 V
Lead Channel Setting Pacing Pulse Width: 0.5 ms
Lead Channel Setting Pacing Pulse Width: 0.5 ms
Lead Channel Setting Sensing Sensitivity: 0.5 mV
Pulse Gen Serial Number: 5550090

## 2023-02-04 NOTE — Telephone Encounter (Signed)
Received Cardiac Clearance form from [place] requesting patient be cleared for the following procedure Cysto/TURP. Form was placed in Dr. Osie Cheeks folder on 07/Mar/2024 for signature. Will update chart once clearance has been reviewed and signed by provider.

## 2023-02-05 ENCOUNTER — Other Ambulatory Visit (HOSPITAL_COMMUNITY): Payer: Self-pay | Admitting: Cardiology

## 2023-02-05 MED ORDER — SPIRONOLACTONE 25 MG PO TABS: 25.0000 mg | ORAL_TABLET | Freq: Every day | ORAL | 3 refills | Status: DC

## 2023-02-15 ENCOUNTER — Telehealth (HOSPITAL_COMMUNITY): Payer: Self-pay

## 2023-02-15 DIAGNOSIS — N401 Enlarged prostate with lower urinary tract symptoms: Secondary | ICD-10-CM | POA: Insufficient documentation

## 2023-02-15 HISTORY — PX: PROSTATE SURGERY: SHX751

## 2023-02-15 NOTE — Telephone Encounter (Signed)
Surgical clearance faxed to [facility] at [phone]

## 2023-02-17 ENCOUNTER — Ambulatory Visit: Payer: Medicaid Other | Admitting: Internal Medicine

## 2023-02-21 NOTE — Progress Notes (Unsigned)
Cardiology Office Note Date:  02/21/2023  Patient ID:  Roger Crosby, DOB 05/15/1961, MRN [id number] PCP:  Patient, No Pcp Per  Cardiologist:  Dr. Aundra Dubin Electrophysiologist: Dr. Caryl Comes    Chief Complaint:  *** 3 mo  History of Present Illness: Roger Crosby is a 62 y.o. male with history of LBBB, NICM, CRT-D, HTN, GERD, CKD (III)  Last EP visit was with Dr. Caryl Comes back in 02/07/2015 as far as I can find  He saw Dr. Aundra Dubin May 2021, at that time discussed intermittent LTFU 2/2 prison and poor follow up, + cocaine at one time as well spending time in rehab Was at that tie without recurrent drug use. BP was elevated, reported compliance with medicines. Coreg increased, aladactone added Recommended visit with Dr. Radford Pax for CPAP titration.  He saw Dr. Aundra Dubin 10/20/21, significant daytime sleepiness, needs new sleep study, Echo was done and reviewed, showing EF 40-45%, moderate LVH, diffuse hypokinesis, PASP 33, mild MR.   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  Feels like he has good exertional capacity No CP, palpitations or cardiac awareness No SOB, no dizzy spells, near syncope or syncope Denies any shocks from his device CorVue was down, but no exam findings or symptoms of volume OL No changes were made, to c/w HF team  I saw him 10/16/22 He is living in [place] now, has gotten a PMD (sees them for the 1st time next week), no cardiology there yet. He has felt well, no CP, palpitations or cardiac awareness. He got a cold about 2 weeks ago, generally feeling better but can't seem to shake the sinus congestion/cough, no fever. No SOB, denies DOE otherwise. No dizzy spells, near syncope or syncope. No shocks Device ERI and planned for gen change  Saw HF team  01/05/23, doing OK, some SOB w/stairs, living in [facility] pending a move to [place]. Coreg was titrated up, planned for an echo next visit, started Jardiance Reported no active cocaine use  *** site *** check outputs *** volume *** moving?   Device information SJM CRT-D implanted 12/14/2014, LV lead was added 01/2015 (epicardial), gen change 11/04/22 RA/RV MDT leads  Past Medical History:  Diagnosis Date   AICD (automatic cardioverter/defibrillator) present    a.  Unsuccessful LV lead placement 11/2014   CAD (coronary artery disease)    a. nonobstructive by [facility] 6/11: oOM1 40%, mild luminal irregs elsewhere b. cath 09/2014L no obstructive disease c. 03/2016: Low-risk NST   CHF (congestive heart failure) (HCC)    CKD (chronic kidney disease)    GERD (gastroesophageal reflux disease)    High cholesterol    HTN (hypertension)    Hx of echocardiogram    echo 5/11: EF 45-50%, mild HK of Ap AS wall, ap Ant wall and true apex (LAD distribution), mild LAE   LBBB (left bundle branch block)    LBP (low back pain)    NICM (nonischemic cardiomyopathy) ([place])    Systolic heart failure ([place])     Past Surgical History:  Procedure Laterality Date   BI-VENTRICULAR IMPLANTABLE CARDIOVERTER DEFIBRILLATOR N/A 12/14/2014   with failed LV lead placeemnt   BIV ICD GENERATOR CHANGEOUT N/A 11/04/2022   Procedure: BIV ICD GENERATOR CHANGEOUT;  Surgeon: Deboraha Sprang, MD;  Location[facility]  INVASIVE CV LAB;  Service: Cardiovascular;  Laterality: N/A;   CARDIAC CATHETERIZATION Left 07/2013   with angiogram   DEBRIDEMENT AND CLOSURE WOUND Right 09/20/2015   Procedure: DEBRIDEMENT AND CLOSURE WOUND;  Surgeon: Dayna Barker, MD;  Location: [facility];  Service: Plastics;  Laterality: Right;   EPICARDIAL PACING LEAD PLACEMENT N/A 02/07/2015   eicardial LV lead placed by Dr Laverta Baltimore   LEFT HEART CATH AND CORONARY ANGIOGRAPHY N/A 07/05/2019   Procedure: LEFT HEART CATH AND CORONARY ANGIOGRAPHY;  Surgeon: Larey Dresser, MD;  Location:  [facility] CV LAB;  Service: Cardiovascular;  Laterality: N/A;   LEFT HEART CATHETERIZATION WITH CORONARY ANGIOGRAM N/A 08/04/2013   Procedure: LEFT HEART CATHETERIZATION WITH CORONARY ANGIOGRAM;  Surgeon: Larey Dresser, MD;  Location[facility] CATH LAB;  Service: Cardiovascular;  Laterality: N/A;   TENDON REPAIR Left 09/20/2015   Procedure: repair nerve tendon wrist;  Surgeon: Dayna Barker, MD;  Location: [facility];  Service: Plastics;  Laterality: Left;   THORACOTOMY Left 02/07/2015   Procedure: THORACOTOMY MAJOR;  Surgeon: Gaye Pollack, MD;  Location: [facility] OR;  Service: Thoracic;  Laterality: Left;    Current Outpatient Medications  Medication Sig Dispense Refill   aspirin 81 MG EC tablet Take 1 tablet (81 mg total) by mouth daily. 90 tablet 2   atorvastatin (LIPITOR) 20 MG tablet Take 1 tablet every day by oral route at bedtime for 90 days.     carvedilol (COREG) 12.5 MG tablet Take 1.5 tablets (18.75 mg total) by mouth 2 (two) times daily. 90 tablet 8   empagliflozin (JARDIANCE) 10 MG TABS tablet Take 1 tablet (10 mg total) by mouth daily before breakfast. 30 tablet 8   omeprazole (PRILOSEC) 20 MG capsule TAKE 1 CAPSULE BY MOUTH EVERY DAY 30 capsule 0   sacubitril-valsartan (ENTRESTO) 97-103 MG Take 1 tablet by mouth 2 (two) times daily. 180 tablet 3   spironolactone (ALDACTONE) 25 MG tablet Take 1 tablet (25 mg total) by mouth daily. 90 tablet 3   tamsulosin (FLOMAX) 0.4 MG CAPS capsule Take 0.4 mg by mouth in the morning.     No current facility-administered medications for this visit.    Allergies:   Patient has no known allergies.   Social History:  The patient  reports that he has quit smoking. His smoking use included cigarettes. He has a 3.00 pack-year smoking history. He has never used smokeless tobacco. He reports current alcohol use. He reports that he does not currently use drugs after having used the following drugs: "Crack" cocaine.   Family History:  The patient's family history  includes Congestive Heart Failure in his mother; Crohn's disease in his sister; Diabetes in his mother; HIV/AIDS in his brother; Heart Problems in his father; Heart attack in his father and mother; Heart disease in his father and mother; Hypertension in his mother; Prostate cancer in his brother.  ROS:  Please see the history of present illness.    All other systems are reviewed and otherwise negative.   PHYSICAL EXAM:  VS:  There were no vitals taken for this visit. BMI: There is no height or weight on file to calculate BMI. Well nourished, well developed, in no acute distress HEENT: normocephalic, atraumatic Neck: no JVD, carotid bruits or masses Cardiac: *** RRR; no significant murmurs, no rubs, or gallops Lungs: *** CTA b/l, no wheezing, rhonchi or rales Abd: soft, nontender MS: no deformity or atrophy Ext: *** no edema Skin: warm and dry, no rash Neuro:  No  gross deficits appreciated Psych: euthymic mood, full affect  *** ICD site is stable, no tethering or discomfort   EKG:  not done today  Device interrogation done today and reviewed by myself:  ***   10/20/2021: TTE IMPRESSIONS   1. Left ventricular ejection fraction, by estimation, is 40 to 45%. The  left ventricle has mildly decreased function. The left ventricle  demonstrates global hypokinesis. There is moderate left ventricular  hypertrophy. Left ventricular diastolic  parameters are consistent with Grade I diastolic dysfunction (impaired  relaxation).   2. Right ventricular systolic function is normal. The right ventricular  size is normal. There is normal pulmonary artery systolic pressure. The  estimated right ventricular systolic pressure is [id number] mmHg.   3. The mitral valve is normal in structure. Mild mitral valve  regurgitation. No evidence of mitral stenosis.   4. The aortic valve is tricuspid. Aortic valve regurgitation is not  visualized. No aortic stenosis is present.   5. The inferior vena cava is  normal in size with <50% respiratory  variability, suggesting right atrial pressure of 8 mmHg.    07/05/2019: LHC 1. Anomalous LCx off proximal RCA.  2. No significant coronary disease.    Suspect noncardiac chest pain (it started prior to him using cocaine).    07/04/2019: TTE IMPRESSIONS   1. The left ventricle has mild-moderately reduced systolic function, with  an ejection fraction of 40-45%. The cavity size was normal. There is mild  concentric left ventricular hypertrophy. Left ventricular diastolic  Doppler parameters are consistent  with impaired relaxation. There is abnormal septal motion consistent with  left bundle branch block. Left ventrical global hypokinesis without  regional wall motion abnormalities.   2. Left atrial size was mildly dilated.   3. Right atrial size was mildly dilated.   4. The aorta is normal in size and structure.   5. When compared to the prior study: 03/11/2018, there appears to be loss  of LV synchrony and decreased left ventricular systolic function. Consider  re-evaluation of CRT.   Recent Labs: 10/16/2022: Hemoglobin 14.9; Platelets 302 01/05/2023: B Natriuretic Peptide 14.9; BUN 12; Creatinine, Ser 1.25; Potassium 3.8; Sodium 141  No results found for requested labs within last 365 days.   CrCl cannot be calculated (Patient's most recent lab result is older than the maximum 21 days allowed.).   Wt Readings from Last 3 Encounters:  01/05/23 237 lb 12.8 oz (107.9 kg)  11/04/22 225 lb (102.1 kg)  10/16/22 227 lb (103 kg)     Other studies reviewed: Additional studies/records reviewed today include: summarized above  ASSESSMENT AND PLAN:  CRT-D ***  NICM Chronic CHF *** No symptoms or exam findings of voume OL *** CorVue  C/w AHF team  HTN *** Looks OK   Disposition: ***  Current medicines are reviewed at length with the patient today.  The patient did not have any concerns regarding medicines.  Venetia Night,  PA-C 02/21/2023 12:57 PM     [place] [address] [place] [address] [phone] (office)  [phone] (fax)

## 2023-02-24 NOTE — Telephone Encounter (Signed)
Per Larose Kells, cma clearance was faxed 3/18

## 2023-02-25 ENCOUNTER — Encounter: Payer: Self-pay | Admitting: Physician Assistant

## 2023-02-25 ENCOUNTER — Ambulatory Visit (INDEPENDENT_AMBULATORY_CARE_PROVIDER_SITE_OTHER): Payer: Medicaid Other | Admitting: Internal Medicine

## 2023-02-25 ENCOUNTER — Encounter: Payer: Self-pay | Admitting: Internal Medicine

## 2023-02-25 ENCOUNTER — Ambulatory Visit: Payer: Medicaid Other | Attending: Internal Medicine | Admitting: Physician Assistant

## 2023-02-25 VITALS — BP 104/70 | HR 84 | Ht 68.0 in | Wt 235.0 lb

## 2023-02-25 VITALS — BP 122/82 | HR 76 | Temp 98.4°F | Ht 68.0 in | Wt 235.0 lb

## 2023-02-25 DIAGNOSIS — N1831 Chronic kidney disease, stage 3a: Secondary | ICD-10-CM

## 2023-02-25 DIAGNOSIS — R7303 Prediabetes: Secondary | ICD-10-CM

## 2023-02-25 DIAGNOSIS — I428 Other cardiomyopathies: Secondary | ICD-10-CM | POA: Insufficient documentation

## 2023-02-25 DIAGNOSIS — R0789 Other chest pain: Secondary | ICD-10-CM

## 2023-02-25 DIAGNOSIS — F17200 Nicotine dependence, unspecified, uncomplicated: Secondary | ICD-10-CM

## 2023-02-25 DIAGNOSIS — R718 Other abnormality of red blood cells: Secondary | ICD-10-CM

## 2023-02-25 DIAGNOSIS — Z95810 Presence of automatic (implantable) cardiac defibrillator: Secondary | ICD-10-CM

## 2023-02-25 DIAGNOSIS — Z560 Unemployment, unspecified: Secondary | ICD-10-CM

## 2023-02-25 DIAGNOSIS — N138 Other obstructive and reflux uropathy: Secondary | ICD-10-CM

## 2023-02-25 DIAGNOSIS — Z59868 Other specified financial insecurity: Secondary | ICD-10-CM

## 2023-02-25 DIAGNOSIS — I5022 Chronic systolic (congestive) heart failure: Secondary | ICD-10-CM

## 2023-02-25 DIAGNOSIS — E6609 Other obesity due to excess calories: Secondary | ICD-10-CM

## 2023-02-25 DIAGNOSIS — Z6835 Body mass index (BMI) 35.0-35.9, adult: Secondary | ICD-10-CM

## 2023-02-25 DIAGNOSIS — I509 Heart failure, unspecified: Secondary | ICD-10-CM | POA: Insufficient documentation

## 2023-02-25 DIAGNOSIS — N401 Enlarged prostate with lower urinary tract symptoms: Secondary | ICD-10-CM

## 2023-02-25 DIAGNOSIS — N402 Nodular prostate without lower urinary tract symptoms: Secondary | ICD-10-CM

## 2023-02-25 DIAGNOSIS — I10 Essential (primary) hypertension: Secondary | ICD-10-CM

## 2023-02-25 DIAGNOSIS — Z79899 Other long term (current) drug therapy: Secondary | ICD-10-CM | POA: Diagnosis not present

## 2023-02-25 DIAGNOSIS — Z119 Encounter for screening for infectious and parasitic diseases, unspecified: Secondary | ICD-10-CM | POA: Diagnosis not present

## 2023-02-25 DIAGNOSIS — K219 Gastro-esophageal reflux disease without esophagitis: Secondary | ICD-10-CM

## 2023-02-25 DIAGNOSIS — Z5986 Financial insecurity: Secondary | ICD-10-CM

## 2023-02-25 DIAGNOSIS — Z7409 Other reduced mobility: Secondary | ICD-10-CM

## 2023-02-25 DIAGNOSIS — E785 Hyperlipidemia, unspecified: Secondary | ICD-10-CM

## 2023-02-25 DIAGNOSIS — I447 Left bundle-branch block, unspecified: Secondary | ICD-10-CM

## 2023-02-25 DIAGNOSIS — Z87891 Personal history of nicotine dependence: Secondary | ICD-10-CM | POA: Diagnosis not present

## 2023-02-25 DIAGNOSIS — G4730 Sleep apnea, unspecified: Secondary | ICD-10-CM

## 2023-02-25 DIAGNOSIS — E66812 Obesity, class 2: Secondary | ICD-10-CM

## 2023-02-25 DIAGNOSIS — F39 Unspecified mood [affective] disorder: Secondary | ICD-10-CM

## 2023-02-25 LAB — MICROALBUMIN / CREATININE URINE RATIO
Creatinine,U: 143 mg/dL
Microalb Creat Ratio: 14.2 mg/g (ref 0.0–30.0)
Microalb, Ur: 20.2 mg/dL — ABNORMAL HIGH (ref 0.0–1.9)

## 2023-02-25 LAB — LIPID PANEL
Cholesterol: 169 mg/dL (ref 0–200)
HDL: 43 mg/dL (ref >39.00)
LDL Cholesterol: 106 mg/dL — ABNORMAL HIGH (ref 0–99)
NonHDL: 125.99
Total CHOL/HDL Ratio: 4
Triglycerides: 98 mg/dL (ref 0.0–149.0)
VLDL: 19.6 mg/dL (ref 0.0–40.0)

## 2023-02-25 LAB — CUP PACEART INCLINIC DEVICE CHECK
Battery Remaining Longevity: 74 mo
Brady Statistic RA Percent Paced: 0.61 %
Brady Statistic RV Percent Paced: 98 %
Date Time Interrogation Session: 20240328095258
HighPow Impedance: 65.25 Ohm
Implantable Lead Connection Status: 753985
Implantable Lead Connection Status: 753985
Implantable Lead Connection Status: 753985
Implantable Lead Implant Date: 20160115
Implantable Lead Implant Date: 20160115
Implantable Lead Implant Date: 20160310
Implantable Lead Location: 753858
Implantable Lead Location: 753859
Implantable Lead Location: 753860
Implantable Lead Model: 5076
Implantable Lead Model: 511212
Implantable Lead Serial Number: 247936
Implantable Pulse Generator Implant Date: 20231206
Lead Channel Impedance Value: 362.5 Ohm
Lead Channel Impedance Value: 450 Ohm
Lead Channel Impedance Value: 575 Ohm
Lead Channel Pacing Threshold Amplitude: 0.5 V
Lead Channel Pacing Threshold Amplitude: 0.5 V
Lead Channel Pacing Threshold Amplitude: 1 V
Lead Channel Pacing Threshold Amplitude: 1 V
Lead Channel Pacing Threshold Amplitude: 1 V
Lead Channel Pacing Threshold Amplitude: 1 V
Lead Channel Pacing Threshold Pulse Width: 0.5 ms
Lead Channel Pacing Threshold Pulse Width: 0.5 ms
Lead Channel Pacing Threshold Pulse Width: 0.5 ms
Lead Channel Pacing Threshold Pulse Width: 0.5 ms
Lead Channel Pacing Threshold Pulse Width: 0.5 ms
Lead Channel Pacing Threshold Pulse Width: 0.5 ms
Lead Channel Sensing Intrinsic Amplitude: 12 mV
Lead Channel Sensing Intrinsic Amplitude: 4.2 mV
Lead Channel Setting Pacing Amplitude: 2 V
Lead Channel Setting Pacing Amplitude: 2 V
Lead Channel Setting Pacing Amplitude: 2 V
Lead Channel Setting Pacing Pulse Width: 0.5 ms
Lead Channel Setting Pacing Pulse Width: 0.5 ms
Lead Channel Setting Sensing Sensitivity: 0.5 mV
Pulse Gen Serial Number: 5550090

## 2023-02-25 LAB — URINALYSIS, ROUTINE W REFLEX MICROSCOPIC
Bilirubin Urine: NEGATIVE
Ketones, ur: NEGATIVE
Leukocytes,Ua: NEGATIVE
Nitrite: NEGATIVE
Specific Gravity, Urine: 1.025 (ref 1.000–1.030)
Total Protein, Urine: 30 — AB
Urine Glucose: NEGATIVE
Urobilinogen, UA: 0.2 (ref 0.0–1.0)
pH: 6 (ref 5.0–8.0)

## 2023-02-25 LAB — CBC
HCT: 45.7 % (ref 39.0–52.0)
Hemoglobin: 14.6 g/dL (ref 13.0–17.0)
MCHC: 32.1 g/dL (ref 30.0–36.0)
MCV: 81.2 fl (ref 78.0–100.0)
Platelets: 324 10*3/uL (ref 150.0–400.0)
RBC: 5.62 Mil/uL (ref 4.22–5.81)
RDW: 14.7 % (ref 11.5–15.5)
WBC: 7.2 10*3/uL (ref 4.0–10.5)

## 2023-02-25 LAB — COMPREHENSIVE METABOLIC PANEL
ALT: 49 U/L (ref 0–53)
AST: 24 U/L (ref 0–37)
Albumin: 4.4 g/dL (ref 3.5–5.2)
Alkaline Phosphatase: 98 U/L (ref 39–117)
BUN: 14 mg/dL (ref 6–23)
CO2: 27 mEq/L (ref 19–32)
Calcium: 9.5 mg/dL (ref 8.4–10.5)
Chloride: 106 mEq/L (ref 96–112)
Creatinine, Ser: 1.34 mg/dL (ref 0.40–1.50)
GFR: 56.99 mL/min — ABNORMAL LOW (ref >60.00)
Glucose, Bld: 112 mg/dL — ABNORMAL HIGH (ref 70–99)
Potassium: 4.7 mEq/L (ref 3.5–5.1)
Sodium: 140 mEq/L (ref 135–145)
Total Bilirubin: 0.5 mg/dL (ref 0.2–1.2)
Total Protein: 7.2 g/dL (ref 6.0–8.3)

## 2023-02-25 LAB — BRAIN NATRIURETIC PEPTIDE: Pro B Natriuretic peptide (BNP): 19 pg/mL (ref 0.0–100.0)

## 2023-02-25 LAB — HEMOGLOBIN A1C: Hgb A1c MFr Bld: 6.3 % (ref 4.6–6.5)

## 2023-02-25 LAB — MAGNESIUM: Magnesium: 1.9 mg/dL (ref 1.5–2.5)

## 2023-02-25 LAB — PHOSPHORUS: Phosphorus: 3.3 mg/dL (ref 2.3–4.6)

## 2023-02-25 LAB — VITAMIN D 25 HYDROXY (VIT D DEFICIENCY, FRACTURES): VITD: 13.93 ng/mL — ABNORMAL LOW (ref 30.00–100.00)

## 2023-02-25 LAB — URIC ACID: Uric Acid, Serum: 4 mg/dL (ref 4.0–7.8)

## 2023-02-25 MED ORDER — EMPAGLIFLOZIN 10 MG PO TABS: 10.0000 mg | ORAL_TABLET | Freq: Every day | ORAL | 8 refills | Status: DC

## 2023-02-25 MED ORDER — PEPCID COMPLETE 10-800-165 MG PO CHEW: 1.0000 | CHEWABLE_TABLET | Freq: Every day | ORAL | 11 refills | Status: DC | PRN

## 2023-02-25 MED ORDER — CARVEDILOL 12.5 MG PO TABS: 18.7500 mg | ORAL_TABLET | Freq: Two times a day (BID) | ORAL | 8 refills | Status: DC

## 2023-02-25 MED ORDER — ENTRESTO 97-103 MG PO TABS: 1.0000 | ORAL_TABLET | Freq: Two times a day (BID) | ORAL | 3 refills | Status: DC

## 2023-02-25 MED ORDER — ASPIRIN 81 MG PO TBEC: 81.0000 mg | DELAYED_RELEASE_TABLET | Freq: Every day | ORAL | 2 refills | Status: DC

## 2023-02-25 MED ORDER — TAMSULOSIN HCL 0.4 MG PO CAPS: 0.4000 mg | ORAL_CAPSULE | Freq: Every morning | ORAL | 3 refills | Status: DC

## 2023-02-25 MED ORDER — SPIRONOLACTONE 25 MG PO TABS: 25.0000 mg | ORAL_TABLET | Freq: Every day | ORAL | 3 refills | Status: DC

## 2023-02-25 MED ORDER — FUROSEMIDE 20 MG PO TABS: 20.0000 mg | ORAL_TABLET | Freq: Every day | ORAL | 3 refills | Status: DC

## 2023-02-25 MED ORDER — ATORVASTATIN CALCIUM 20 MG PO TABS: ORAL_TABLET | ORAL | 3 refills | Status: DC

## 2023-02-25 NOTE — Assessment & Plan Note (Signed)
Advised patient to use pepcid complete which works better for as needed use.

## 2023-02-25 NOTE — Assessment & Plan Note (Signed)
Encouraged patient to to continue with Dr. Aundra Dubin Encouraged patient to stay consistent with all the medications and refilled them but advised patient to only take according to Dr. Oleh Genin directions.

## 2023-02-25 NOTE — Patient Instructions (Addendum)
It was a pleasure seeing you today!  Your health and satisfaction are my top priorities. If you believe your experience today was worthy of a 5-star rating, I'd be grateful for your feedback! Loralee Pacas, MD   CHECKOUT CHECKLIST  []    Schedule next appointment(s):    Return for chronic disease monitoring and management 1-3 months.  Any requested lab visits should be scheduled as appointments too  If you are not doing well:  Return to the office sooner Please bring all your medicine bottles to each appointment If your condition begins to worsen or become severe:  go to the emergency room or even call 911  []    Sign release of information authorizations: Any records we need for your care and to be your medical home   []   (Optional):  Review your clinical notes on MyChart after they are completed.     Today's draft of the physician documented plan for today's visit: (final revisions will be visible on MyChart chart later) Stage 3a chronic kidney disease ([place]) -     Ambulatory referral to [place] -     VITAMIN D 25 Hydroxy (Vit-D Deficiency, Fractures) -     Phosphorus -     Magnesium -     Uric acid -     Microalbumin / creatinine urine ratio -     Cystatin C with Glomerular Filtration Rate, Estimated (eGFR) -     Urinalysis, Routine w reflex microscopic  Screening examination for infectious disease -     Hepatitis C antibody -     HIV Antibody (routine testing w rflx) -     Ambulatory referral to [place]  Former smoker -     CT CHEST LUNG CANCER SCREENING LOW DOSE WO CONTRAST; Future -     Ambulatory referral to [place] (automatic cardioverter/defibrillator) present -     Ambulatory referral to [place]  Atypical chest pain -     Ambulatory referral to [place]  Benign prostatic hyperplasia with urinary obstruction -     Ambulatory referral to Home Health  Chronic systolic CHF (congestive heart failure) (HCC) Assessment & Plan: Encouraged  patient to to continue with Dr. Aundra Dubin Encouraged patient to stay consistent with all the medications and refilled them but advised patient to only take according to Dr. Oleh Genin directions.  Orders: -     CBC -     Comprehensive metabolic panel -     Lipid panel -     Ambulatory referral to [place] -     Brain natriuretic peptide -     Aspirin; Take 1 tablet (81 mg total) by mouth daily.  Dispense: 90 tablet; Refill: 2 -     Atorvastatin Calcium; Take 1 tablet every day by oral route at bedtime for 90 days.  Dispense: 90 tablet; Refill: 3 -     Carvedilol; Take 1.5 tablets (18.75 mg total) by mouth 2 (two) times daily.  Dispense: 90 tablet; Refill: 8 -     Empagliflozin; Take 1 tablet (10 mg total) by mouth daily before breakfast.  Dispense: 30 tablet; Refill: 8 -     Furosemide; Take 1 tablet (20 mg total) by mouth daily.  Dispense: 90 tablet; Refill: 3 -     Entresto; Take 1 tablet by mouth 2 (two) times daily.  Dispense: 180 tablet; Refill: 3 -     Spironolactone; Take 1 tablet (25 mg total) by mouth daily.  Dispense:  90 tablet; Refill: 3  Class 2 obesity due to excess calories without serious comorbidity with body mass index (BMI) of 35.0 to 35.9 in adult -     Ambulatory referral to Home Health  Gastroesophageal reflux disease without esophagitis Assessment & Plan: Advised patient to use pepcid complete which works better for as needed use.  Orders: -     Ambulatory referral to Home Health  Chronic heart failure, unspecified heart failure type [facility]) -     Ambulatory referral to [place] Complete; Chew 1 tablet by mouth daily as needed.  Dispense: 100 tablet; Refill: 11  Hypertension, unspecified type -     Ambulatory referral to Home Health  Hyperlipidemia, unspecified hyperlipidemia type -     Ambulatory referral to Home Health  LBBB (left bundle branch block) -     Ambulatory referral to [place]  NICM (nonischemic cardiomyopathy) ([place]) -      Ambulatory referral to Home Health  Prediabetes -     Ambulatory referral to [place] -     Ambulatory referral to [place] -     Tamsulosin HCl; Take 1 capsule (0.4 mg total) by mouth in the morning.  Dispense: 90 capsule; Refill: 3  S/P ICD (internal cardiac defibrillator) procedure -     Ambulatory referral to Home Health  Sleep apnea, unspecified type -     Ambulatory referral to [place] -     Ambulatory referral to [facility]  Tobacco dependence -     Ambulatory referral to [place]  Elevated red blood cell count -     Ambulatory referral to Home Health  Mood disorder [facility]) Assessment & Plan: "I think I'm good with that" patient reports   Orders: -     Ambulatory referral to Psychology      QUESTIONS & CONCERNS: CLINICAL: please contact us via phone [phone] OR MyChart messaging  LAB & IMAGING:   We will call you if the results are significantly abnormal or you don't use MyChart.  Most normal results will be posted to MyChart immediately and have a clinical review message by Dr. Randol Kern posted within 2-3 business days.   If you have not heard from [place] regarding the results in 2 weeks OR if you need priority reporting, please contact this office. MYCHART:  The fastest way to get your results and easiest way to stay in touch with [place] is by activating your My Chart account. Instructions are located on the last page of this paperwork.  BILLING: xray and lab orders are billed from separate companies and questions./concerns should be directed to the [facility].  For visit charges please discuss with our administrative services COMPLAINTS:  please let Dr. Randol Kern know or see the [place], by asking at the front desk: we want you to be satisfied with every experience and we would be grateful for the opportunity to address any problems

## 2023-02-25 NOTE — Patient Instructions (Addendum)
Medication Instructions:   START JARDIANCE 10 DAYS  PRIOR TO  LAB IN 3 WEEKS   *If you need a refill on your cardiac medications before your next appointment, please call your pharmacy*   Lab Work:  BMET [place] BMET IN 3 WEEKS   If you have labs (blood work) drawn today and your tests are completely normal, you will receive your results only by: [place] (if you have MyChart) OR A paper copy in the mail If you have any lab test that is abnormal or we need to change your treatment, we will call you to review the results.   Testing/Procedures:  NONE ORDERED  TODAY    Follow-Up: At [facility], you and your health needs are our priority.  As part of our continuing mission to provide you with exceptional heart care, we have created designated Provider Care Teams.  These Care Teams include your primary Cardiologist (physician) and Advanced Practice Providers (APPs -  Physician Assistants and Nurse Practitioners) who all work together to provide you with the care you need, when you need it.  We recommend signing up for the patient portal called "MyChart".  Sign up information is provided on this After Visit Summary.  MyChart is used to connect with patients for Virtual Visits (Telemedicine).  Patients are able to view lab/test results, encounter notes, upcoming appointments, etc.  Non-urgent messages can be sent to your provider as well.   To learn more about what you can do with MyChart, go to [email or web address].    Your next appointment:    1 year(s)  Provider:    Virl Axe, MD  ONLY    Other Instructions

## 2023-02-25 NOTE — Addendum Note (Signed)
Addended by: Claude Manges on: 02/25/2023 09:29 AM   Modules accepted: Orders

## 2023-02-25 NOTE — Progress Notes (Signed)
[place]  Phone: [phone]  New patient visit  Visit Date: 02/25/2023 Patient: Roger Crosby   DOB: October 23, 1961   62 y.o. Male  MRN: [id number] PCP:  Loralee Pacas, MD  (establishing care today)  [place] Provider: Loralee Pacas, MD   Assessment and Plan:   This initial visit focused on establishing a primary care relationship and gathering a comprehensive medical history. We addressed key concerns and prioritized next  steps. For any remaining concerns, we encourage Roger Crosby to schedule a follow-up appointment at his earliest convenience.   I encouraged 1-3 month follow up but gave a year of medication(s) to help ensure he doesn't run out.   His main concern was getting home health because he recently lost it moving from [place] to here and he can't care for self  Stage 3a chronic kidney disease ([place]) -     Ambulatory referral to [place] -     VITAMIN D 25 Hydroxy (Vit-D Deficiency, Fractures) -     Phosphorus -     Magnesium -     Uric acid -     Microalbumin / creatinine urine ratio -     Cystatin C with Glomerular Filtration Rate, Estimated (eGFR) -     Urinalysis, Routine w reflex microscopic  Screening examination for infectious disease -     Hepatitis C antibody -     HIV Antibody (routine testing w rflx) -     Ambulatory referral to [place]  Former smoker -     CT CHEST LUNG CANCER SCREENING LOW DOSE WO CONTRAST; Future -     Ambulatory referral to [place] (automatic cardioverter/defibrillator) present -     Ambulatory referral to [place]  Atypical chest pain -     Ambulatory referral to [place]  Benign prostatic hyperplasia with urinary obstruction -     Ambulatory referral to Home Health  Chronic systolic CHF (congestive heart failure) (HCC) Assessment & Plan: Encouraged patient to to continue with Dr. Aundra Dubin Encouraged patient to stay consistent with all the medications and refilled them  but advised patient to only take according to Dr. Oleh Genin directions.  Orders: -     CBC -     Comprehensive metabolic panel -     Lipid panel -     Ambulatory referral to [place] -     Brain natriuretic peptide -     Aspirin; Take 1 tablet (81 mg total) by mouth daily.  Dispense: 90 tablet; Refill: 2 -     Atorvastatin Calcium; Take 1 tablet every day by oral route at bedtime for 90 days.  Dispense: 90 tablet; Refill: 3 -     Carvedilol; Take 1.5 tablets (18.75 mg total) by mouth 2 (two) times daily.  Dispense: 90 tablet; Refill: 8 -     Empagliflozin; Take 1 tablet (10 mg total) by mouth daily before breakfast.  Dispense: 30 tablet; Refill: 8 -     Furosemide; Take 1 tablet (20 mg total) by mouth daily.  Dispense: 90 tablet; Refill: 3 -     Entresto; Take 1 tablet by mouth 2 (two) times daily.  Dispense: 180 tablet; Refill: 3 -     Spironolactone; Take 1 tablet (25 mg total) by mouth daily.  Dispense: 90 tablet; Refill: 3  Class 2 obesity due to excess calories without serious comorbidity with body mass index (BMI) of 35.0 to 35.9 in adult -  Ambulatory referral to Home Health  Gastroesophageal reflux disease without esophagitis Assessment & Plan: Advised patient to use pepcid complete which works better for as needed use.  Orders: -     Ambulatory referral to Home Health  Chronic heart failure, unspecified heart failure type [facility]) -     Ambulatory referral to [place] Complete; Chew 1 tablet by mouth daily as needed.  Dispense: 100 tablet; Refill: 11  Hypertension, unspecified type -     Ambulatory referral to Home Health  Hyperlipidemia, unspecified hyperlipidemia type -     Ambulatory referral to Home Health  LBBB (left bundle branch block) -     Ambulatory referral to [place]  NICM (nonischemic cardiomyopathy) ([place]) -     Ambulatory referral to Home Health  Prediabetes -     Ambulatory referral to [place] -     Ambulatory referral to [place] -     Tamsulosin HCl; Take 1 capsule (0.4 mg total) by mouth in the morning.  Dispense: 90 capsule; Refill: 3  S/P ICD (internal cardiac defibrillator) procedure -     Ambulatory referral to [place]  Sleep apnea, unspecified type -     Ambulatory referral to [place] -     Ambulatory referral to [facility]  Tobacco dependence -     Ambulatory referral to [place]  Elevated red blood cell count -     Ambulatory referral to [place] disorder [facility]) Assessment & Plan: "I think I'm good with that" patient reports   Orders: -     Ambulatory referral to Psychology  Mobility impaired  Financial insecurity due to medical expenses      Subjective:   Roger Crosby presents today with intent to establish care with Loralee Pacas, MD as his Primary Care Provider (PCP) going forward.   His main concern(s) / chief complaint(s) are New Patient (Initial Visit) and Needs documentation for Home Health   HPI  Problem-oriented charting was used to develop and update his medical history: Problem  Former Smoker   2 ppd, for about 18 year(s),    Benign Prostatic Hyperplasia With Urinary Obstruction   Status post turbt 02/15/2023, minimal bleeding, no more dribbling.       Mood Disorder (Hcc)      02/25/2023   10:26 AM 07/26/2020    3:03 PM 03/15/2015    2:10 PM  Depression screen PHQ 2/9  Decreased Interest 2 0 0  Down, Depressed, Hopeless 0 0 0  PHQ - 2 Score 2 0 0  Altered sleeping 1    Tired, decreased energy 1    Change in appetite 0    Feeling bad or failure about yourself  0    Trouble concentrating 0    Moving slowly or fidgety/restless 0    Suicidal thoughts 0    PHQ-9 Score 4    Difficult doing work/chores Not difficult at all        07/26/2020    3:03 PM  GAD 7 : Generalized Anxiety Score  Nervous, Anxious, on Edge 0  Control/stop worrying 0  Worry too much -  different things 1  Trouble relaxing 0  Restless 0  Easily annoyed or irritable 0  Afraid - awful might happen 0  Total GAD 7 Score 1       Prediabetes   Lab Results  Component Value Date   HGBA1C 5.8 (H) 03/12/2018       Aicd (Automatic Cardioverter/Defibrillator) Present   For lbbb and nonischemic cardiomyopathy Patient reports it came from heavy lifting   Sleep Apnea   Was supposed to return to sleep clinic but did not   Ckd (Chronic Kidney Disease) Stage 3, Gfr 30-59 Ml/Min (Hcc)   Lab Results  Component Value Date/Time   GFR 72.75 12/10/2014 09:31 AM   GFR 62.58 10/05/2013 10:40 AM   GFR 73.10 09/06/2013 12:24 PM   GFR 60.75 08/15/2013 10:40 AM   GFR 67.77 08/01/2013 11:00 AM      Chronic Systolic Chf (Congestive Heart Failure) (Hcc)   Loralee Pacas, MD entered the following information on February 25, 2023:  BNP (last 3 results) Recent Labs    01/05/23 1109  BNP 14.9   Lab Results  Component Value Date   TSH 2.358 01/28/2009    ProBNP (last 3 results) No results for input(s): "PROBNP" in the last 8760 hours.  New aicd December 2023 Patient reports that they are not experiencing any leg swelling      Nodular Prostate Without Urinary Obstruction   Following with Lovenia Shuck urology Lab Results  Component Value Date   PSA 0.69 03/15/2015   PSA 0.43 01/07/2010   PSA 0.46 04/09/2009       Hypertension   Current hypertension medications:       Sig   carvedilol (COREG) 12.5 MG tablet (Taking) Take 1.5 tablets (18.75 mg total) by mouth 2 (two) times daily.   sacubitril-valsartan (ENTRESTO) 97-103 MG (Taking) Take 1 tablet by mouth 2 (two) times daily.   spironolactone (ALDACTONE) 25 MG tablet (Taking) Take 1 tablet (25 mg total) by mouth daily.   furosemide (LASIX) 20 MG tablet Take 20 mg by mouth daily.    Patient not taking: Reported on 02/25/2023       Patient reports compliance with current medications and no significant side effect(s)   Home readings:good at home "most of the time" BP Readings from Last 3 Encounters:  02/25/23 122/82  02/25/23 104/70  01/05/23 130/78         GERD   Loralee Pacas, MD entered the following information on February 25, 2023:  "Not that bad, have to watch what I eat" Only take medications as needed omeprazole    Elevated Red Blood Cell Count (Resolved)   Lab Results  Component Value Date/Time   HGB 14.9 10/16/2022 10:06 AM   HGB 15.9 10/20/2021 12:08 PM   HGB 14.6 07/06/2019 02:20 AM   HGB 14.9 07/05/2019 06:57 AM   HGB 15.1 07/04/2019 04:15 AM   HGB 15.3 03/11/2018 02:00 AM      Normocytic Hypochromic Anemia (Resolved)  History of Drug Abuse (Hcc) (Resolved)   cocaine   Dribbling of Urine (Resolved)  Tobacco Dependence (Resolved)  Elevated Troponin (Resolved)  Cocaine Use (Resolved)  Chest Pain (Resolved)  Substance Abuse (Hcc) (Resolved)  Atypical Chest Pain (Resolved)  S/P Icd (Internal Cardiac Defibrillator) Procedure (Resolved)  DENTAL PAIN (Resolved)   Qualifier: Diagnosis of  By: Jorene Minors, Scott     DYSPNEA (Resolved)   Qualifier: Diagnosis of  By: Jorene Minors, Scott        Depression Screen    02/25/2023   10:26 AM 07/26/2020    3:03 PM 03/15/2015    2:10 PM  PHQ 2/9 Scores  PHQ - 2 Score 2 0 0  PHQ- 9 Score 4  Results for orders placed or performed in visit on 02/25/23  [facility] [place]  Result Value Ref Range   Date Time Interrogation Session [id number]    Pulse Generator Manufacturer SJ[employer]    Pulse Gen Model [id number] Unify Assura    Pulse Gen Serial Number [id number]    Clinic Name [facility]    Implantable Pulse Generator Type Cardiac Resynch Therapy Defibulator    Implantable Pulse Generator Implant Date [id number]    Implantable Lead Manufacturer OTHER    Implantable Lead Model [id number] Myopore    Implantable Lead Serial Number [id number]    Implantable Lead Implant Date [id number]:[id number]    Implantable Lead Location  Detail 1 UNKNOWN    Implantable Lead Location [id number]    Implantable Lead Connection Status [id number]    Implantable Lead Manufacturer MERM    Implantable Lead Model 5076 CapSureFix Novus    Implantable Lead Serial Number [id number]    Implantable Lead Implant Date [id number]    Implantable Lead Location Detail 1 UNKNOWN    Implantable Lead Location [id number]    Implantable Lead Connection Status [id number]    Implantable Lead Manufacturer [facility]    Implantable Lead Model [id number] Sprint Quattro Secure S    Implantable Lead Serial Number [id number]    Implantable Lead Implant Date VD:[id number]    Implantable Lead Location Detail 1 UNKNOWN    Implantable Lead Location [id number]    Implantable Lead Connection Status [id number]    Lead Channel Setting Sensing Sensitivity 0.5 mV   Lead Channel Setting Pacing Amplitude 2.0 V   Lead Channel Setting Pacing Pulse Width 0.5 ms   Lead Channel Setting Pacing Amplitude 2.0 V   Lead Channel Setting Pacing Pulse Width 0.5 ms   Lead Channel Setting Pacing Amplitude 2.0 V   Lead Channel Setting Pacing Capture Mode Fixed Pacing    Zone Setting Status Active    Zone Setting Status Inactive    Zone Setting Status Active    Lead Channel Impedance Value 450.0 ohm   Lead Channel Sensing Intrinsic Amplitude 4.2 mV   Lead Channel Pacing Threshold Amplitude 0.5 V   Lead Channel Pacing Threshold Pulse Width 0.5 ms   Lead Channel Pacing Threshold Amplitude 0.5 V   Lead Channel Pacing Threshold Pulse Width 0.5 ms   Lead Channel Impedance Value 575.0 ohm   Lead Channel Sensing Intrinsic Amplitude 12.0 mV   Lead Channel Pacing Threshold Amplitude 1.0 V   Lead Channel Pacing Threshold Pulse Width 0.5 ms   Lead Channel Pacing Threshold Amplitude 1.0 V   Lead Channel Pacing Threshold Pulse Width 0.5 ms   HighPow Impedance 65.25 ohm   Lead Channel Impedance Value 362.5 ohm   Lead Channel Pacing Threshold Amplitude 1.0 V   Lead Channel Pacing Threshold Pulse Width 0.5 ms   Lead Channel  Pacing Threshold Amplitude 1.0 V   Lead Channel Pacing Threshold Pulse Width 0.5 ms   Battery Remaining Longevity 74 mo   Brady Statistic RA Percent Paced 0.61 %   Brady Statistic RV Percent Paced 98.0 %    The following were reviewed and entered/updated into his MEDICAL RECORD NUMBERPast Medical History:  Diagnosis Date   AICD (automatic cardioverter/defibrillator) present    a.  Unsuccessful LV lead placement 11/2014   Atypical chest pain 04/07/2016   CAD (coronary artery disease)    a. nonobstructive by LHC 6/11: oOM1 40%, mild luminal irregs elsewhere b. cath 09/2014L no obstructive disease c. 03/2016: Low-risk NST   Chest  pain 03/11/2018   CHF (congestive heart failure) ([place])    CKD (chronic kidney disease)    Cocaine use 07/04/2019   Dribbling of urine 12/18/2022   DYSPNEA 04/04/2010   Qualifier: Diagnosis of   By: Jorene Minors, Scott       Elevated red blood cell count 12/21/2022   Lab Results  Component  Value  Date/Time     HGB  14.9  10/16/2022 10:06 AM     HGB  15.9  10/20/2021 12:08 PM     HGB  14.6  07/06/2019 02:20 AM     HGB  14.9  07/05/2019 06:57 AM     HGB  15.1  07/04/2019 04:15 AM     HGB  15.3  03/11/2018 02:00 AM         Elevated troponin 07/04/2019   GERD (gastroesophageal reflux disease)    High cholesterol    History of drug abuse ([place]) 12/18/2022   cocaine   HTN (hypertension)    Hx of echocardiogram    echo 5/11: EF 45-50%, mild HK of Ap AS wall, ap Ant wall and true apex (LAD distribution), mild LAE   LBBB (left bundle branch block)    LBP (low back pain)    NICM (nonischemic cardiomyopathy) ([place])    Normocytic hypochromic anemia 12/21/2022   S/P ICD (internal cardiac defibrillator) procedure 02/07/2015   Substance abuse ([place]) 0000000   Systolic heart failure ([place])    Tobacco dependence 07/26/2020   Past Surgical History:  Procedure Laterality Date   BI-VENTRICULAR IMPLANTABLE CARDIOVERTER DEFIBRILLATOR N/A 12/14/2014   with failed LV lead placeemnt    BIV ICD GENERATOR CHANGEOUT N/A 11/04/2022   Procedure: BIV ICD GENERATOR CHANGEOUT;  Surgeon: Deboraha Sprang, MD;  Location: [facility];  Service: Cardiovascular;  Laterality: N/A;   CARDIAC CATHETERIZATION Left 07/31/2013   with angiogram   DEBRIDEMENT AND CLOSURE WOUND Right 09/20/2015   Procedure: DEBRIDEMENT AND CLOSURE WOUND;  Surgeon: Dayna Barker, MD;  Location: [facility];  Service: Plastics;  Laterality: Right;   EPICARDIAL PACING LEAD PLACEMENT N/A 02/07/2015   eicardial LV lead placed by Dr Laverta Baltimore   LEFT HEART CATH AND CORONARY ANGIOGRAPHY N/A 07/05/2019   Procedure: LEFT HEART CATH AND CORONARY ANGIOGRAPHY;  Surgeon: Larey Dresser, MD;  Location: [facility];  Service: Cardiovascular;  Laterality: N/A;   LEFT HEART CATHETERIZATION WITH CORONARY ANGIOGRAM N/A 08/04/2013   Procedure: LEFT HEART CATHETERIZATION WITH CORONARY ANGIOGRAM;  Surgeon: Larey Dresser, MD;  Location: [facility];  Service: Cardiovascular;  Laterality: N/A;   PROSTATE SURGERY  02/15/2023   turbt and cystoscopy Dr. Lovenia Shuck   TENDON REPAIR Left 09/20/2015   Procedure: repair nerve tendon wrist;  Surgeon: Dayna Barker, MD;  Location: [facility];  Service: Plastics;  Laterality: Left;   THORACOTOMY Left 02/07/2015   Procedure: THORACOTOMY MAJOR;  Surgeon: Gaye Pollack, MD;  Location: [facility];  Service: Thoracic;  Laterality: Left;   Family History  Problem Relation Age of Onset   Heart attack Mother        in 37's   Heart disease Mother        ischemic   Congestive Heart Failure Mother    Diabetes Mother    Hypertension Mother    Heart attack Father        in 27's   Heart disease Father        ischemic   Heart Problems Father    Crohn's disease  Sister    Prostate cancer Brother    HIV/AIDS Brother    Outpatient Medications Prior to Visit  Medication Sig Dispense Refill   aspirin 81 MG EC tablet Take 1 tablet (81 mg total) by mouth daily. 90 tablet 2   atorvastatin (LIPITOR) 20  MG tablet Take 1 tablet every day by oral route at bedtime for 90 days.     carvedilol (COREG) 12.5 MG tablet Take 1.5 tablets (18.75 mg total) by mouth 2 (two) times daily. 90 tablet 8   empagliflozin (JARDIANCE) 10 MG TABS tablet Take 1 tablet (10 mg total) by mouth daily before breakfast. 30 tablet 8   omeprazole (PRILOSEC) 20 MG capsule TAKE 1 CAPSULE BY MOUTH EVERY DAY 30 capsule 0   sacubitril-valsartan (ENTRESTO) 97-103 MG Take 1 tablet by mouth 2 (two) times daily. 180 tablet 3   spironolactone (ALDACTONE) 25 MG tablet Take 1 tablet (25 mg total) by mouth daily. 90 tablet 3   tamsulosin (FLOMAX) 0.4 MG CAPS capsule Take 0.4 mg by mouth in the morning.     furosemide (LASIX) 20 MG tablet Take 20 mg by mouth daily. (Patient not taking: Reported on 02/25/2023)     No facility-administered medications prior to visit.    No Known Allergies Social History   Tobacco Use   Smoking status: Former    Packs/day: 0.12    Years: 25.00    Additional pack years: 0.00    Total pack years: 3.00    Types: Cigarettes   Smokeless tobacco: Never  Vaping Use   Vaping Use: Never used  Substance Use Topics   Alcohol use: Yes    Comment: occasionally   Drug use: Not Currently    Types: "Crack" cocaine    Comment: clean x 6 mths    Immunization History  Administered Date(s) Administered   PFIZER(Purple Top)SARS-COV-2 Vaccination 05/06/2020, 05/27/2020   Td 01/07/2010   Tdap 09/20/2015    Objective:  Physical Exam  BP 122/82 (BP Location: Left Arm, Patient Position: Sitting)   Pulse 76   Temp 98.4 F (36.9 C) (Temporal)   Ht 5\' 8"  (1.727 m)   Wt 235 lb (106.6 kg)   SpO2 95%   BMI 35.73 kg/m  Wt Readings from Last 10 Encounters:  02/25/23 235 lb (106.6 kg)  02/25/23 235 lb (106.6 kg)  01/05/23 237 lb 12.8 oz (107.9 kg)  11/04/22 225 lb (102.1 kg)  10/16/22 227 lb (103 kg)  11/11/21 208 lb 6.4 oz (94.5 kg)  10/20/21 205 lb 9.6 oz (93.3 kg)  07/26/20 230 lb 3.2 oz (104.4 kg)   07/03/20 228 lb 9.6 oz (103.7 kg)  05/29/20 230 lb (104.3 kg)   Vital signs reviewed.  Nursing notes reviewed. Weight trend reviewed. Abnormalities and Problem-Specific physical exam findings:  truncal adiposity, very motivated and interested in optimizing his health. General Appearance:  Well developed, well nourished, well-groomed, healthy-appearing male with Body mass index is 35.73 kg/m.Marland Kitchen No acute distress appreciable.   Skin: Clear and well-hydrated. Pulmonary:  Normal work of breathing at rest, no respiratory distress apparent. SpO2: 95 %  Musculoskeletal:  Patient demonstrates smooth and coordinated movements throughout all major joints. All extremities are intact.  Neurological:  Awake, alert, oriented, and engaged.  No obvious focal neurological deficits or cognitive impairments.  Sensorium seems unclouded. Gait is smooth and coordinated Psychiatric:  Appropriate mood, pleasant and cooperative demeanor, cheerful and engaged during the exam  Results Reviewed (reviewed labs/imaging may be also be found in the  assessment / plan section):    No recent labwork available

## 2023-02-25 NOTE — Assessment & Plan Note (Signed)
"  I think I'm good with that" patient reports

## 2023-02-26 LAB — BASIC METABOLIC PANEL
BUN/Creatinine Ratio: 11 (ref 10–24)
BUN: 14 mg/dL (ref 8–27)
CO2: 19 mmol/L — ABNORMAL LOW (ref 20–29)
Calcium: 9 mg/dL (ref 8.6–10.2)
Chloride: 106 mmol/L (ref 96–106)
Creatinine, Ser: 1.25 mg/dL (ref 0.76–1.27)
Glucose: 118 mg/dL — ABNORMAL HIGH (ref 70–99)
Potassium: 4.6 mmol/L (ref 3.5–5.2)
Sodium: 140 mmol/L (ref 134–144)
eGFR: 66 mL/min/{1.73_m2} (ref >59)

## 2023-02-26 LAB — HEPATITIS C ANTIBODY: Hepatitis C Ab: NONREACTIVE

## 2023-02-26 LAB — CYSTATIN C WITH GLOMERULAR FILTRATION RATE, ESTIMATED (EGFR)
CYSTATIN C: 1.04 mg/L (ref 0.52–1.20)
eGFR: 74 mL/min/{1.73_m2} (ref >=60)

## 2023-02-26 LAB — HIV ANTIBODY (ROUTINE TESTING W REFLEX): HIV 1&2 Ab, 4th Generation: NONREACTIVE

## 2023-02-28 ENCOUNTER — Encounter: Payer: Self-pay | Admitting: Internal Medicine

## 2023-02-28 DIAGNOSIS — E559 Vitamin D deficiency, unspecified: Secondary | ICD-10-CM | POA: Insufficient documentation

## 2023-02-28 NOTE — Progress Notes (Signed)
Labs normal for him except low vitamin D  the Endocrine Society recommends 6000-10000 IU/day of vitamin D3 for 8 weeks, followed by maintenance therapy of 1500-2000 IU/day. However, given the patient's CKD, monitoring calcium and phosphate levels is crucial to avoid exacerbating hyperphosphatemia and secondary hyperparathyroidism.  Please place any orders needed / requested. Arrange/confirm follow up appt(s). For 5000 units d3 daily for 8 weeks, and follow up appointment for at the end of that.  If he wants nephrology specialist I encourage it and order that too.

## 2023-03-08 ENCOUNTER — Telehealth: Payer: Self-pay | Admitting: Internal Medicine

## 2023-03-08 ENCOUNTER — Other Ambulatory Visit: Payer: Self-pay

## 2023-03-08 DIAGNOSIS — R7989 Other specified abnormal findings of blood chemistry: Secondary | ICD-10-CM

## 2023-03-08 MED ORDER — VITAMIN D3 125 MCG (5000 UT) PO CAPS: 5000.0000 [IU] | ORAL_CAPSULE | Freq: Every day | ORAL | 1 refills | Status: DC

## 2023-03-08 NOTE — Telephone Encounter (Signed)
Karrie Meres with [facility] is asking to have paperwork faxed back ASAP. States pt is needing services now. Any questions, please call [phone]

## 2023-03-09 NOTE — Progress Notes (Signed)
Remote ICD transmission.   

## 2023-03-10 NOTE — Telephone Encounter (Signed)
[facility] called again and needs paperwork ASAP. Please advise.

## 2023-03-10 NOTE — Telephone Encounter (Signed)
Pt would like a call back with regards with paperwork. Please advise.

## 2023-03-15 ENCOUNTER — Other Ambulatory Visit: Payer: Self-pay

## 2023-03-15 DIAGNOSIS — R809 Proteinuria, unspecified: Secondary | ICD-10-CM

## 2023-03-15 DIAGNOSIS — N1831 Chronic kidney disease, stage 3a: Secondary | ICD-10-CM

## 2023-03-15 NOTE — Telephone Encounter (Signed)
Pt called back and is inquiring on the paperwork. Please advise.

## 2023-03-15 NOTE — Telephone Encounter (Signed)
Caller states:  - They received fax of [employer] 30-51 form on 03/12/23.  - Section 4 is not complete; this can cause pt to not be eligible   Caller requests if we have initial copy, to complete section 4 and re-fax to them.

## 2023-03-19 ENCOUNTER — Ambulatory Visit: Payer: Medicaid Other

## 2023-03-19 NOTE — Telephone Encounter (Signed)
This has been taken care of, patient is aware.

## 2023-03-22 ENCOUNTER — Telehealth: Payer: Self-pay | Admitting: Internal Medicine

## 2023-03-22 NOTE — Telephone Encounter (Signed)
Roger Crosby with [facility] requests to be called at ph# [phone] Medical Clearance Form received - needs to confirm information

## 2023-03-23 NOTE — Telephone Encounter (Signed)
Spoke with Aundra Millet at [facility] concerning this.

## 2023-04-07 ENCOUNTER — Ambulatory Visit: Payer: Medicaid Other | Admitting: Internal Medicine

## 2023-04-09 ENCOUNTER — Ambulatory Visit: Payer: Medicaid Other | Admitting: Internal Medicine

## 2023-04-20 ENCOUNTER — Other Ambulatory Visit: Payer: Self-pay | Admitting: Internal Medicine

## 2023-04-20 DIAGNOSIS — I428 Other cardiomyopathies: Secondary | ICD-10-CM

## 2023-04-20 DIAGNOSIS — I10 Essential (primary) hypertension: Secondary | ICD-10-CM

## 2023-04-20 DIAGNOSIS — Z560 Unemployment, unspecified: Secondary | ICD-10-CM

## 2023-04-20 DIAGNOSIS — I447 Left bundle-branch block, unspecified: Secondary | ICD-10-CM

## 2023-04-20 DIAGNOSIS — G4730 Sleep apnea, unspecified: Secondary | ICD-10-CM

## 2023-04-20 DIAGNOSIS — I5022 Chronic systolic (congestive) heart failure: Secondary | ICD-10-CM

## 2023-04-20 DIAGNOSIS — N183 Chronic kidney disease, stage 3 unspecified: Secondary | ICD-10-CM

## 2023-04-20 DIAGNOSIS — Z95810 Presence of automatic (implantable) cardiac defibrillator: Secondary | ICD-10-CM

## 2023-04-22 NOTE — Addendum Note (Signed)
Addended by: Lula Olszewski on: 04/22/2023 11:01 AM   Modules accepted: Orders

## 2023-04-29 ENCOUNTER — Ambulatory Visit: Payer: Self-pay | Admitting: Internal Medicine

## 2023-05-06 ENCOUNTER — Ambulatory Visit (INDEPENDENT_AMBULATORY_CARE_PROVIDER_SITE_OTHER): Payer: Medicaid Other

## 2023-05-06 DIAGNOSIS — I428 Other cardiomyopathies: Secondary | ICD-10-CM

## 2023-05-07 LAB — CUP PACEART REMOTE DEVICE CHECK
Battery Remaining Longevity: 74 mo
Battery Remaining Percentage: 90 %
Battery Voltage: 3.11 V
Brady Statistic AP VP Percent: 1 %
Brady Statistic AP VS Percent: 1 %
Brady Statistic AS VP Percent: 98 %
Brady Statistic AS VS Percent: 1.3 %
Brady Statistic RA Percent Paced: 1 %
Date Time Interrogation Session: 20240607111345
HighPow Impedance: 68 Ohm
HighPow Impedance: 68 Ohm
Implantable Lead Connection Status: 753985
Implantable Lead Connection Status: 753985
Implantable Lead Connection Status: 753985
Implantable Lead Implant Date: 20160115
Implantable Lead Implant Date: 20160115
Implantable Lead Implant Date: 20160310
Implantable Lead Location: 753858
Implantable Lead Location: 753859
Implantable Lead Location: 753860
Implantable Lead Model: 5076
Implantable Lead Model: 511212
Implantable Lead Serial Number: 247936
Implantable Pulse Generator Implant Date: 20231206
Lead Channel Impedance Value: 380 Ohm
Lead Channel Impedance Value: 460 Ohm
Lead Channel Impedance Value: 510 Ohm
Lead Channel Pacing Threshold Amplitude: 0.5 V
Lead Channel Pacing Threshold Amplitude: 1 V
Lead Channel Pacing Threshold Amplitude: 1 V
Lead Channel Pacing Threshold Pulse Width: 0.5 ms
Lead Channel Pacing Threshold Pulse Width: 0.5 ms
Lead Channel Pacing Threshold Pulse Width: 0.5 ms
Lead Channel Sensing Intrinsic Amplitude: 12 mV
Lead Channel Sensing Intrinsic Amplitude: 4.5 mV
Lead Channel Setting Pacing Amplitude: 2 V
Lead Channel Setting Pacing Amplitude: 2 V
Lead Channel Setting Pacing Amplitude: 2 V
Lead Channel Setting Pacing Pulse Width: 0.5 ms
Lead Channel Setting Pacing Pulse Width: 0.5 ms
Lead Channel Setting Sensing Sensitivity: 0.5 mV
Pulse Gen Serial Number: 5550090

## 2023-05-10 ENCOUNTER — Ambulatory Visit: Payer: Self-pay | Admitting: Internal Medicine

## 2023-05-12 ENCOUNTER — Encounter: Payer: Self-pay | Admitting: Internal Medicine

## 2023-05-12 ENCOUNTER — Ambulatory Visit (INDEPENDENT_AMBULATORY_CARE_PROVIDER_SITE_OTHER): Payer: Medicaid Other | Admitting: Internal Medicine

## 2023-05-12 VITALS — BP 116/82 | HR 81 | Temp 98.4°F | Ht 68.0 in | Wt 235.0 lb

## 2023-05-12 DIAGNOSIS — I5022 Chronic systolic (congestive) heart failure: Secondary | ICD-10-CM | POA: Diagnosis not present

## 2023-05-12 DIAGNOSIS — Z95810 Presence of automatic (implantable) cardiac defibrillator: Secondary | ICD-10-CM | POA: Diagnosis not present

## 2023-05-12 DIAGNOSIS — E559 Vitamin D deficiency, unspecified: Secondary | ICD-10-CM | POA: Diagnosis not present

## 2023-05-12 DIAGNOSIS — R7989 Other specified abnormal findings of blood chemistry: Secondary | ICD-10-CM

## 2023-05-12 DIAGNOSIS — K011 Impacted teeth: Secondary | ICD-10-CM | POA: Diagnosis not present

## 2023-05-12 DIAGNOSIS — K219 Gastro-esophageal reflux disease without esophagitis: Secondary | ICD-10-CM

## 2023-05-12 DIAGNOSIS — N183 Chronic kidney disease, stage 3 unspecified: Secondary | ICD-10-CM | POA: Diagnosis not present

## 2023-05-12 DIAGNOSIS — N402 Nodular prostate without lower urinary tract symptoms: Secondary | ICD-10-CM

## 2023-05-12 DIAGNOSIS — I509 Heart failure, unspecified: Secondary | ICD-10-CM

## 2023-05-12 DIAGNOSIS — R350 Frequency of micturition: Secondary | ICD-10-CM

## 2023-05-12 LAB — COMPREHENSIVE METABOLIC PANEL
ALT: 44 U/L (ref 0–53)
AST: 21 U/L (ref 0–37)
Albumin: 4.2 g/dL (ref 3.5–5.2)
Alkaline Phosphatase: 102 U/L (ref 39–117)
BUN: 16 mg/dL (ref 6–23)
CO2: 24 mEq/L (ref 19–32)
Calcium: 9.7 mg/dL (ref 8.4–10.5)
Chloride: 105 mEq/L (ref 96–112)
Creatinine, Ser: 1.35 mg/dL (ref 0.40–1.50)
GFR: 56.4 mL/min — ABNORMAL LOW (ref >60.00)
Glucose, Bld: 115 mg/dL — ABNORMAL HIGH (ref 70–99)
Potassium: 4.7 mEq/L (ref 3.5–5.1)
Sodium: 138 mEq/L (ref 135–145)
Total Bilirubin: 0.5 mg/dL (ref 0.2–1.2)
Total Protein: 7.2 g/dL (ref 6.0–8.3)

## 2023-05-12 LAB — URINALYSIS, ROUTINE W REFLEX MICROSCOPIC
Bilirubin Urine: NEGATIVE
Ketones, ur: NEGATIVE
Leukocytes,Ua: NEGATIVE
Nitrite: NEGATIVE
Specific Gravity, Urine: 1.02 (ref 1.000–1.030)
Total Protein, Urine: NEGATIVE
Urine Glucose: NEGATIVE
Urobilinogen, UA: 0.2 (ref 0.0–1.0)
pH: 6 (ref 5.0–8.0)

## 2023-05-12 LAB — CBC WITH DIFFERENTIAL/PLATELET
Basophils Absolute: 0.1 10*3/uL (ref 0.0–0.1)
Basophils Relative: 0.7 % (ref 0.0–3.0)
Eosinophils Absolute: 0.2 10*3/uL (ref 0.0–0.7)
Eosinophils Relative: 2.2 % (ref 0.0–5.0)
HCT: 46.6 % (ref 39.0–52.0)
Hemoglobin: 14.7 g/dL (ref 13.0–17.0)
Lymphocytes Relative: 22.7 % (ref 12.0–46.0)
Lymphs Abs: 2.2 10*3/uL (ref 0.7–4.0)
MCHC: 31.5 g/dL (ref 30.0–36.0)
MCV: 82 fl (ref 78.0–100.0)
Monocytes Absolute: 0.7 10*3/uL (ref 0.1–1.0)
Monocytes Relative: 7.7 % (ref 3.0–12.0)
Neutro Abs: 6.4 10*3/uL (ref 1.4–7.7)
Neutrophils Relative %: 66.7 % (ref 43.0–77.0)
Platelets: 289 10*3/uL (ref 150.0–400.0)
RBC: 5.69 Mil/uL (ref 4.22–5.81)
RDW: 15.7 % — ABNORMAL HIGH (ref 11.5–15.5)
WBC: 9.5 10*3/uL (ref 4.0–10.5)

## 2023-05-12 LAB — VITAMIN D 25 HYDROXY (VIT D DEFICIENCY, FRACTURES): VITD: 18.65 ng/mL — ABNORMAL LOW (ref 30.00–100.00)

## 2023-05-12 LAB — URIC ACID: Uric Acid, Serum: 4.3 mg/dL (ref 4.0–7.8)

## 2023-05-12 LAB — MAGNESIUM: Magnesium: 2 mg/dL (ref 1.5–2.5)

## 2023-05-12 MED ORDER — EMPAGLIFLOZIN 10 MG PO TABS
10.0000 mg | ORAL_TABLET | Freq: Every day | ORAL | 3 refills | Status: DC
Start: 2023-05-12 — End: 2023-10-20

## 2023-05-12 MED ORDER — OMEPRAZOLE 20 MG PO CPDR
20.0000 mg | DELAYED_RELEASE_CAPSULE | Freq: Every day | ORAL | 3 refills | Status: DC
Start: 2023-05-12 — End: 2023-10-20

## 2023-05-12 MED ORDER — SPIRONOLACTONE 25 MG PO TABS: 25.0000 mg | ORAL_TABLET | Freq: Every day | ORAL | 3 refills | Status: DC

## 2023-05-12 MED ORDER — ENTRESTO 97-103 MG PO TABS: 1.0000 | ORAL_TABLET | Freq: Two times a day (BID) | ORAL | 3 refills | Status: DC

## 2023-05-12 MED ORDER — CARVEDILOL 12.5 MG PO TABS
18.7500 mg | ORAL_TABLET | Freq: Two times a day (BID) | ORAL | 8 refills | Status: DC
Start: 2023-05-12 — End: 2023-10-20

## 2023-05-12 MED ORDER — PEPCID COMPLETE 10-800-165 MG PO CHEW: 1.0000 | CHEWABLE_TABLET | Freq: Every day | ORAL | 11 refills | Status: DC | PRN

## 2023-05-12 MED ORDER — TAMSULOSIN HCL 0.4 MG PO CAPS: 0.4000 mg | ORAL_CAPSULE | Freq: Every morning | ORAL | 3 refills | Status: DC

## 2023-05-12 NOTE — Progress Notes (Signed)
Roger Crosby: [phone]   Routine Medical Office Visit  Patient:  Roger Crosby      Age: 62 y.o.       Sex:  male  Date:   05/12/2023 PCP:    Roger Olszewski, MD   Today's Healthcare Provider: Lula Olszewski, MD   Assessment and Plan:    Routine 3 months follow up visit  Medication Refills: He requested refills for multiple medications. We will refill Entresto, Spironolactone, Coreg, and attempt to obtain Jardiance. Vitamin D levels will be checked before refilling Vitamin D3, and his acid reflux medication will be refilled.  Follow-up: We will closely monitor his kidney and heart issues due to the multiple medications. A follow-up is scheduled in 3 months with blood work.      [name] was seen today for medical management of chronic issues and pre-op exam.  Impacted third molar tooth Overview: Right upper molar Dental wanted clearance from Primary Care Provider (PCP) but he switched dentist and they don't need. Given recent cystoscopy and turp clearance from cardiology, suspect he can go ahead with procedure using that similar clearance.   Assessment & Plan: Impacted Right Upper Wisdom Tooth: He is experiencing pain from an impacted right upper wisdom tooth, necessitating extraction. We discussed the risks associated with anesthesia, especially given his automated defibrillator and heart failure. Encouraged patient to to discuss with his cardiologist for clearance before undergoing dental surgery.  He was dizzy last month briefly with urinary tract infection (UTI) after turp, if labwork urinalysis good then the prior cardiac clearance is still valid in my medical opinion.  Offered to write letter if needed if that's the case without further appointment.    AICD (automatic cardioverter/defibrillator) present Overview: For lbbb and nonischemic cardiomyopathy Patient reports it came from heavy lifting   Chronic systolic CHF (congestive heart failure)  (HCC) Overview: Roger Olszewski, MD entered the following information on February 25, 2023:  BNP (last 3 results) Recent Labs    01/05/23 1109  BNP 14.9   Lab Results  Component Value Date   TSH 2.358 01/28/2009     ProBNP (last 3 results) No results for input(s): "PROBNP" in the last 8760 hours.  New aicd December 2023 Patient reports that they are not experiencing any leg swelling     Orders: -     Urinalysis, Routine w reflex microscopic -     CBC with Differential/Platelet -     Comprehensive metabolic panel -     Carvedilol; Take 1.5 tablets (18.75 mg total) by mouth 2 (two) times daily.  Dispense: 90 tablet; Refill: 8 -     Empagliflozin; Take 1 tablet (10 mg total) by mouth daily before breakfast.  Dispense: 90 tablet; Refill: 3 -     Entresto; Take 1 tablet by mouth 2 (two) times daily.  Dispense: 180 tablet; Refill: 3 -     Spironolactone; Take 1 tablet (25 mg total) by mouth daily.  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  Dispense: 90 capsule; Refill: 3  Vitamin  D deficiency Overview: Vitamin D Levels Lab Results  Component Value Date/Time   VD25OH 13.93 (L) 02/25/2023 11:23 AM     Orders: -     VITAMIN D 25 Hydroxy (Vit-D Deficiency, Fractures)  Stage 3 chronic kidney disease, unspecified whether stage 3a or 3b CKD (HCC) Overview: Lab Results  Component Value Date/Time   GFR 72.75 12/10/2014 09:31 AM   GFR 62.58 10/05/2013 10:40 AM   GFR 73.10 09/06/2013 12:24 PM   GFR 60.75 08/15/2013 10:40 AM   GFR 67.77 08/01/2013 11:00 AM     Orders: -      Urinalysis, Routine w reflex microscopic -     Comprehensive metabolic panel -     Magnesium -     Uric acid  Gastroesophageal reflux disease without esophagitis Overview: Didn't get Pepcid complete over price "Not that bad, have to watch what I eat" Only take medications as needed omeprazole   Orders: -     Pepcid Complete; Chew 1 tablet by mouth daily as needed.  Dispense: 100 tablet; Refill: 11 -     Omeprazole; Take 1 capsule (20 mg total) by mouth daily.  Dispense: 30 capsule; Refill: 3  Urinary frequency Overview: He continues to experience frequent urination following cystoscopy and TURP. Had urinary tract infection (UTI) with dizziness 03/2023 which resolved Takes Flomax, aldactone, entresto,lasix   Assessment & Plan: Seems maybe inadequate response to turp vs persistent urinary tract infection (UTI) vs medication(s) side effect(s)  Encouraged patient to to stay hydrated due to chronic kidney disease but be careful due to CHF which is currently volume controlled.            Clinical Presentation:    62 y.o. male here today for Medical Management of Chronic Issues and Pre-op Exam (Dental extraction right upper wisdom tooth, painful)  AI-Extracted: Discussed the use of AI scribe software for clinical note transcription with the patient, who gave verbal consent to proceed.  History of Present Illness   [name], a patient with a history of heart failure and an automated defibrillator, presents for a pre-operative evaluation for a dental surgery. The patient reports a painful right upper wisdom tooth that has been bothering him since the previous night. The tooth is impacted and has not fully erupted, causing discomfort. The patient has consulted with a dentist who recommended extraction.  In addition to the dental issue, the patient reports a recent episode of dizziness that occurred about a month ago. The dizziness was associated with a urinary tract infection (UTI) for  which he received treatment. The patient also mentions a recent cystoscopy and transurethral resection of the prostate (TURP) procedure performed a month or two ago. Post-procedure, the patient reports improved urination but still experiences frequent bathroom visits.  The patient is currently on multiple medications including Lasix, Entresto, spironolactone, Flomax, aspirin, and atorvastatin. He expresses a desire to restart Flomax to further improve his urinary symptoms. The patient also mentions a recent change in residence and plans to consult with a new dentist for the tooth extraction.        Reviewed chart data: Active Ambulatory Problems    Diagnosis Date Noted   Hyperlipidemia 01/07/2010   ERECTILE DYSFUNCTION 04/09/2009   Hypertension 01/28/2009   LBBB (left bundle branch block) 02/14/2010   GERD 01/28/2009   Nodular prostate without urinary obstruction 04/09/2009   SKIN TAG 02/14/2010   LOW BACK PAIN 01/28/2009   LEG PAIN 02/14/2010   Chronic systolic CHF (congestive heart failure) (HCC)  08/23/2013   NICM (nonischemic cardiomyopathy) (HCC) 12/15/2014   CKD (chronic kidney disease) stage 3, GFR 30-59 ml/min (HCC) 04/07/2016   Obesity (BMI 30.0-34.9) 07/04/2019   Class 2 obesity due to excess calories without serious comorbidity with body mass index (BMI) of 35.0 to 35.9 in adult 07/26/2020   Polyuria 07/26/2020   AICD (automatic cardioverter/defibrillator) present 09/03/2022   Sleep apnea 08/19/2022   Prediabetes 12/18/2022   Mood disorder (HCC) 12/21/2022   Laceration of left median nerve 05/21/2016   Heart failure (HCC) 02/25/2023   Benign prostatic hyperplasia with urinary obstruction 02/15/2023   Former smoker 02/25/2023   Not currently working due to disabled status 02/25/2023   Vitamin D deficiency 02/28/2023   Impacted third molar tooth 05/12/2023   Urinary frequency 05/12/2023   Resolved Ambulatory Problems    Diagnosis Date Noted   DENTAL PAIN 04/04/2010    DYSPNEA 04/04/2010   Chronic systolic heart failure (HCC) 12/14/2014   S/P ICD (internal cardiac defibrillator) procedure 02/07/2015   Atypical chest pain 04/07/2016   Substance abuse (HCC) 04/08/2016   Chest pain 03/11/2018   Elevated troponin 07/04/2019   Cocaine use 07/04/2019   Tobacco dependence 07/26/2020   Elevated red blood cell count 12/21/2022   Normocytic hypochromic anemia 12/21/2022   History of drug abuse (HCC) 12/18/2022   Dribbling of urine 12/18/2022   Coronary arteriosclerosis 12/18/2022   Past Medical History:  Diagnosis Date   CAD (coronary artery disease)    CHF (congestive heart failure) (HCC)    CKD (chronic kidney disease)    GERD (gastroesophageal reflux disease)    High cholesterol    HTN (hypertension)    Hx of echocardiogram    LBP (low back pain)    Systolic heart failure (HCC)     Outpatient Medications Prior to Visit  Medication Sig   aspirin EC 81 MG tablet Take 1 tablet (81 mg total) by mouth daily.   atorvastatin (LIPITOR) 20 MG tablet Take 1 tablet every day by oral route at bedtime for 90 days.   Cholecalciferol (VITAMIN D3) 125 MCG (5000 UT) capsule Take 1 capsule (5,000 Units total) by mouth daily.   furosemide (LASIX) 20 MG tablet Take 1 tablet (20 mg total) by mouth daily.   [DISCONTINUED] carvedilol (COREG) 12.5 MG tablet Take 1.5 tablets (18.75 mg total) by mouth 2 (two) times daily.   [DISCONTINUED] empagliflozin (JARDIANCE) 10 MG TABS tablet Take 1 tablet (10 mg total) by mouth daily before breakfast.   [DISCONTINUED] famotidine-calcium carbonate-magnesium hydroxide (PEPCID COMPLETE) 10-800-165 MG chewable tablet Chew 1 tablet by mouth daily as needed.   [DISCONTINUED] sacubitril-valsartan (ENTRESTO) 97-103 MG Take 1 tablet by mouth 2 (two) times daily.   [DISCONTINUED] spironolactone (ALDACTONE) 25 MG tablet Take 1 tablet (25 mg total) by mouth daily.   [DISCONTINUED] tamsulosin (FLOMAX) 0.4 MG CAPS capsule Take 1 capsule (0.4 mg  total) by mouth in the morning.   No facility-administered medications prior to visit.         Clinical Data Analysis:   Physical Exam  BP 116/82 (BP Location: Left Arm, Patient Position: Sitting)   Pulse 81   Temp 98.4 F (36.9 C) (Temporal)   Ht 5\' 8"  (1.727 m)   Wt 235 lb (106.6 kg)   SpO2 97%   BMI 35.73 kg/m  Wt Readings from Last 10 Encounters:  05/12/23 235 lb (106.6 kg)  02/25/23 235 lb (106.6 kg)  02/25/23 235 lb (106.6 kg)  01/05/23 237 lb 12.8 oz (107.9 kg)  11/04/22 225 lb (102.1 kg)  10/16/22 227 lb (103 kg)  11/11/21 208 lb 6.4 oz (94.5 kg)  10/20/21 205 lb 9.6 oz (93.3 kg)  07/26/20 230 lb 3.2 oz (104.4 kg)  07/03/20 228 lb 9.6 oz (103.7 kg)   Vital signs reviewed.  Nursing notes reviewed. Weight trend reviewed. Abnormalities and Problem-Specific physical exam findings:  euvolemic. In good spirits but complaining of pain from upper right wisdom tooth which is impacted.  General Appearance:  No acute distress appreciable.   Well-groomed, healthy-appearing male.  Well proportioned with no abnormal fat distribution.  Good muscle tone. Skin: Clear and well-hydrated. Pulmonary:  Normal work of breathing at rest, no respiratory distress apparent. SpO2: 97 %  Musculoskeletal: All extremities are intact.  Neurological:  Awake, alert, oriented, and engaged.  No obvious focal neurological deficits or cognitive impairments.  Sensorium seems unclouded.   Speech is clear and coherent with logical content. Psychiatric:  Appropriate mood, pleasant and cooperative demeanor, thoughtful and engaged during the exam  Results Reviewed:    No results found for any visits on 05/12/23.  Recent Results (from the past 2160 hour(s))  Basic Metabolic Panel (BMET)     Status: Abnormal   Collection Time: 02/25/23  9:25 AM  Result Value Ref Range   Glucose 118 (H) 70 - 99 mg/dL   BUN 14 8 - 27 mg/dL   Creatinine, Ser 2.95 0.76 - 1.27 mg/dL   eGFR 66 >62 ZH/YQM/5.78   BUN/Creatinine  Ratio 11 10 - 24   Sodium 140 134 - 144 mmol/L   Potassium 4.6 3.5 - 5.2 mmol/L   Chloride 106 96 - 106 mmol/L   CO2 19 (L) 20 - 29 mmol/L   Calcium 9.0 8.6 - 10.2 mg/dL  CUP PACEART INCLINIC DEVICE CHECK     Status: None   Collection Time: 02/25/23  9:52 AM  Result Value Ref Range   Date Time Interrogation Session [id number]    Pulse Generator Manufacturer [facility]    Pulse Gen Model 4010-27O Wray Kearns    Pulse Gen Serial Number [id number]    Clinic Name [facility]    Implantable Pulse Generator Type Cardiac Resynch Therapy Defibulator    Implantable Pulse Generator Implant Date [id number]    Implantable Lead Manufacturer OTHER    Implantable Lead Model [id number] Myopore    Implantable Lead Serial Number [id number]    Implantable Lead Implant Date [id number]    Implantable Lead Location Detail 1 UNKNOWN    Implantable Lead Location [id number]    Implantable Lead Connection Status [id number]    Implantable Lead Manufacturer [facility]    Implantable Lead Model 5076 CapSureFix Novus    Implantable Lead Serial Number [id number]    Implantable Lead Implant Date 32951884    Implantable Lead Location Detail 1 UNKNOWN    Implantable Lead Location [id number]    Implantable Lead Connection Status [id number]    Implantable Lead Manufacturer [facility]    Implantable Lead Model [id number] Sprint Quattro Secure S    Implantable Lead Serial Number [id number]    Implantable Lead Implant Date 30160109    Implantable Lead Location Detail 1 UNKNOWN    Implantable Lead Location [id number]    Implantable Lead Connection Status [id number]    Lead Channel Setting Sensing Sensitivity 0.5 mV   Lead Channel Setting Pacing Amplitude 2.0 V   Lead Channel Setting Pacing Pulse Width 0.5 ms   Lead Channel Setting Pacing Amplitude 2.0 V   Lead Channel Setting Pacing Pulse Width 0.5  ms   Lead Channel Setting Pacing Amplitude 2.0 V   Lead Channel Setting Pacing Capture Mode Fixed Pacing    Zone Setting Status Active    Zone Setting Status  Inactive    Zone Setting Status Active    Lead Channel Impedance Value 450.0 ohm   Lead Channel Sensing Intrinsic Amplitude 4.2 mV   Lead Channel Pacing Threshold Amplitude 0.5 V   Lead Channel Pacing Threshold Pulse Width 0.5 ms   Lead Channel Pacing Threshold Amplitude 0.5 V   Lead Channel Pacing Threshold Pulse Width 0.5 ms   Lead Channel Impedance Value 575.0 ohm   Lead Channel Sensing Intrinsic Amplitude 12.0 mV   Lead Channel Pacing Threshold Amplitude 1.0 V   Lead Channel Pacing Threshold Pulse Width 0.5 ms   Lead Channel Pacing Threshold Amplitude 1.0 V   Lead Channel Pacing Threshold Pulse Width 0.5 ms   HighPow Impedance 65.25 ohm   Lead Channel Impedance Value 362.5 ohm   Lead Channel Pacing Threshold Amplitude 1.0 V   Lead Channel Pacing Threshold Pulse Width 0.5 ms   Lead Channel Pacing Threshold Amplitude 1.0 V   Lead Channel Pacing Threshold Pulse Width 0.5 ms   Battery Remaining Longevity 74 mo   Kellogg RA Percent Paced 0.61 %   Brady Statistic RV Percent Paced 98.0 %  CBC     Status: None   Collection Time: 02/25/23 11:23 AM  Result Value Ref Range   WBC 7.2 4.0 - 10.5 K/uL   RBC 5.62 4.22 - 5.81 Mil/uL   Platelets 324.0 150.0 - 400.0 K/uL   Hemoglobin 14.6 13.0 - 17.0 g/dL   HCT 16.1 09.6 - 04.5 %   MCV 81.2 78.0 - 100.0 fl   MCHC 32.1 30.0 - 36.0 g/dL   RDW 40.9 81.1 - 91.4 %  Comp Met (CMET)     Status: Abnormal   Collection Time: 02/25/23 11:23 AM  Result Value Ref Range   Sodium 140 135 - 145 mEq/L   Potassium 4.7 3.5 - 5.1 mEq/L   Chloride 106 96 - 112 mEq/L   CO2 27 19 - 32 mEq/L   Glucose, Bld 112 (H) 70 - 99 mg/dL   BUN 14 6 - 23 mg/dL   Creatinine, Ser 7.82 0.40 - 1.50 mg/dL   Total Bilirubin 0.5 0.2 - 1.2 mg/dL   Alkaline Phosphatase 98 39 - 117 U/L   AST 24 0 - 37 U/L   ALT 49 0 - 53 U/L   Total Protein 7.2 6.0 - 8.3 g/dL   Albumin 4.4 3.5 - 5.2 g/dL   GFR 95.62 (L) >13.08 mL/min    Comment: Calculated using the CKD-EPI  Creatinine Equation (2021)   Calcium 9.5 8.4 - 10.5 mg/dL  Lipid Profile     Status: Abnormal   Collection Time: 02/25/23 11:23 AM  Result Value Ref Range   Cholesterol 169 0 - 200 mg/dL    Comment: ATP III Classification       Desirable:  < 200 mg/dL               Borderline High:  200 - 239 mg/dL          High:  > = 657 mg/dL   Triglycerides 84.6 0.0 - 149.0 mg/dL    Comment: Normal:  <962 mg/dLBorderline High:  150 - 199 mg/dL   HDL 95.28 >41.32 mg/dL   VLDL 44.0 0.0 - 10.2 mg/dL   LDL Cholesterol 725 (H) 0 -  99 mg/dL   Total CHOL/HDL Ratio 4     Comment:                Men          Women1/2 Average Risk     3.4          3.3Average Risk          5.0          4.42X Average Risk          9.6          7.13X Average Risk          15.0          11.0                       NonHDL 125.99     Comment: NOTE:  Non-HDL goal should be 30 mg/dL higher than patient's LDL goal (i.e. LDL goal of < 70 mg/dL, would have non-HDL goal of < 100 mg/dL)  Hepatitis C Antibody     Status: None   Collection Time: 02/25/23 11:23 AM  Result Value Ref Range   Hepatitis C Ab NON-REACTIVE NON-REACTIVE    Comment: . HCV antibody was non-reactive. There is no laboratory  evidence of HCV infection. . In most cases, no further action is required. However, if recent HCV exposure is suspected, a test for HCV RNA (test code 40981) is suggested. . For additional information please refer to http://education.[facility].com/faq/FAQ22v1 (This link is being provided for informational/ educational purposes only.) .   HIV antibody (with reflex)     Status: None   Collection Time: 02/25/23 11:23 AM  Result Value Ref Range   HIV 1&2 Ab, 4th Generation NON-REACTIVE NON-REACTIVE    Comment: HIV-1 antigen and HIV-1/HIV-2 antibodies were not detected. There is no laboratory evidence of HIV infection. Marland Kitchen PLEASE NOTE: This information has been disclosed to you from records whose confidentiality may be protected by  state law.  If your state requires such protection, then the state law prohibits you from making any further disclosure of the information without the specific written consent of the person to whom it pertains, or as otherwise permitted by law. A general authorization for the release of medical or other information is NOT sufficient for this purpose. . For additional information please refer to http://education.[facility].com/faq/FAQ106 (This link is being provided for informational/ educational purposes only.) . Marland Kitchen The performance of this assay has not been clinically validated in patients less than 45 years old. .   B Nat Peptide     Status: None   Collection Time: 02/25/23 11:23 AM  Result Value Ref Range   Pro B Natriuretic peptide (BNP) 19.0 0.0 - 100.0 pg/mL  VITAMIN D 25 Hydroxy (Vit-D Deficiency, Fractures)     Status: Abnormal   Collection Time: 02/25/23 11:23 AM  Result Value Ref Range   VITD 13.93 (L) 30.00 - 100.00 ng/mL  Phosphorus     Status: None   Collection Time: 02/25/23 11:23 AM  Result Value Ref Range   Phosphorus 3.3 2.3 - 4.6 mg/dL  Magnesium     Status: None   Collection Time: 02/25/23 11:23 AM  Result Value Ref Range   Magnesium 1.9 1.5 - 2.5 mg/dL  Uric acid     Status: None   Collection Time: 02/25/23 11:23 AM  Result Value Ref Range   Uric Acid, Serum 4.0 4.0 - 7.8 mg/dL  Urine Microalbumin w/creat. ratio  Status: Abnormal   Collection Time: 02/25/23 11:23 AM  Result Value Ref Range   Microalb, Ur 20.2 (H) 0.0 - 1.9 mg/dL   Creatinine,U 161.0 mg/dL   Microalb Creat Ratio 14.2 0.0 - 30.0 mg/g  Cystatin C with Glomerular Filtration Rate, Estimated (eGFR)     Status: None   Collection Time: 02/25/23 11:23 AM  Result Value Ref Range   CYSTATIN C 1.04 0.52 - 1.20 mg/L   eGFR 74 >=60 mL/min/1.63m2    Comment: .                 REFERENCE RANGE:>=60 mL/min/1.39mE2 .   HgB A1c     Status: None   Collection Time: 02/25/23 11:23 AM  Result  Value Ref Range   Hgb A1c MFr Bld 6.3 4.6 - 6.5 %    Comment: Glycemic Control Guidelines for People with Diabetes:Non Diabetic:  <6%Goal of Therapy: <7%Additional Action Suggested:  >8%   Urinalysis, Routine w reflex microscopic     Status: Abnormal   Collection Time: 02/25/23 11:23 AM  Result Value Ref Range   Color, Urine YELLOW Yellow;Lt. Yellow;Straw;Dark Yellow;Amber;Green;Red;Brown   APPearance Sl Cloudy (A) Clear;Turbid;Slightly Cloudy;Cloudy   Specific Gravity, Urine 1.025 1.000 - 1.030   pH 6.0 5.0 - 8.0   Total Protein, Urine 30 (A) Negative   Urine Glucose NEGATIVE Negative   Ketones, ur NEGATIVE Negative   Bilirubin Urine NEGATIVE Negative   Hgb urine dipstick LARGE (A) Negative   Urobilinogen, UA 0.2 0.0 - 1.0   Leukocytes,Ua NEGATIVE Negative   Nitrite NEGATIVE Negative   WBC, UA 0-2/hpf 0-2/hpf   RBC / HPF 21-50/hpf (A) 0-2/hpf   Mucus, UA Presence of (A) None   Squamous Epithelial / HPF Rare(0-4/hpf) Rare(0-4/hpf)  CUP PACEART REMOTE DEVICE CHECK     Status: None   Collection Time: 05/07/23 11:13 AM  Result Value Ref Range   Date Time Interrogation Session [id number]    Pulse Generator Manufacturer [employer]    Pulse Gen Model 3357-40Q [employer]    Pulse Gen Serial Number [id number]    Clinic Name [facility]    Implantable Pulse Generator Type Cardiac Resynch Therapy Defibulator    Implantable Pulse Generator Implant Date 13086578    Implantable Lead Manufacturer OTHER    Implantable Lead Model [id number] Myopore    Implantable Lead Serial Number [id number]    Implantable Lead Implant Date 46962952    Implantable Lead Location Detail 1 UNKNOWN    Implantable Lead Location [id number]    Implantable Lead Connection Status [id number]    Implantable Lead Manufacturer [facility]    Implantable Lead Model 5076 CapSureFix Novus    Implantable Lead Serial Number [id number]    Implantable Lead Implant Date 84132440    Implantable Lead Location Detail 1 UNKNOWN    Implantable Lead  Location [id number]    Implantable Lead Connection Status [id number]    Implantable Lead Manufacturer [facility]    Implantable Lead Model [id number] Sprint Quattro Secure S    Implantable Lead Serial Number [id number]    Implantable Lead Implant Date 53664403    Implantable Lead Location Detail 1 UNKNOWN    Implantable Lead Location [id number]    Implantable Lead Connection Status [id number]    Lead Channel Setting Sensing Sensitivity 0.5 mV   Lead Channel Setting Sensing Adaptation Mode Adaptive Sensing    Lead Channel Setting Pacing Amplitude 2.0 V   Lead Channel Setting Pacing Pulse Width 0.5 ms   Lead Channel Setting Pacing Amplitude 2.0  V   Lead Channel Setting Pacing Pulse Width 0.5 ms   Lead Channel Setting Pacing Amplitude 2.0 V   Lead Channel Setting Pacing Capture Mode Fixed Pacing    Zone Setting Status Active    Zone Setting Status Inactive    Zone Setting Status Active    Lead Channel Status NULL    Lead Channel Impedance Value 380 ohm   Lead Channel Pacing Threshold Amplitude 1.0 V   Lead Channel Pacing Threshold Pulse Width 0.5 ms   Lead Channel Status NULL    Lead Channel Impedance Value 460 ohm   Lead Channel Sensing Intrinsic Amplitude 4.5 mV   Lead Channel Pacing Threshold Amplitude 0.5 V   Lead Channel Pacing Threshold Pulse Width 0.5 ms   Lead Channel Status NULL    Lead Channel Impedance Value 510 ohm   Lead Channel Sensing Intrinsic Amplitude 12.0 mV   Lead Channel Pacing Threshold Amplitude 1.0 V   Lead Channel Pacing Threshold Pulse Width 0.5 ms   HighPow Impedance 68 ohm   HighPow Impedance 68 ohm   HighPow Imped Status NULL    HighPow Imped Status NULL    Battery Status MOS    Battery Remaining Longevity 74 mo   Battery Remaining Percentage 90.0 %   Battery Voltage 3.11 V   Brady Statistic RA Percent Paced 1.0 %   Brady Statistic AP VP Percent 1.0 %   Brady Statistic AS VP Percent 98.0 %   Brady Statistic AP VS Percent 1.0 %   Brady Statistic AS VS Percent 1.3 %    No  image results found.   CUP PACEART REMOTE DEVICE CHECK  Result Date: 05/07/2023 Scheduled remote reviewed. Normal device function.  1 NSVT, no therapy 7 AMS, longest duration 38sec, AF, per PA report ASA only, monitoring SCAF, burden <1% Next remote 91 days. LA,  CVRS  CUP PACEART INCLINIC DEVICE CHECK  Result Date: 02/25/2023 CRT-D device check in office. Thresholds and sensing consistent with previous device measurements. Lead impedance trends stable over time.  Patient bi-ventricularly pacing _98_% of the time. Device programmed with appropriate safety margins. Heart failure diagnostics reviewed and trends are stable for patient. Estimated longevity _6.3-6.6 years__.  Patient enrolled in remote follow up. Plan to check device remotely in 3 months and see in office in 1 year. see office note for arrhythmia discussion some noise on A lead, unable to reproduce, none since his visit with device clinic impedance, sensing, threshold good      This encounter employed real-time, collaborative documentation. The patient actively reviewed and updated their medical record on a shared screen, ensuring transparency and facilitating joint problem-solving for the problem list, overview, and plan. This approach promotes accurate, informed care. The treatment plan was discussed and reviewed in detail, including medication safety, potential side effects, and all patient questions. We confirmed understanding and comfort with the plan. Follow-up instructions were established, including contacting the office for any concerns, returning if symptoms worsen, persist, or new symptoms develop, and precautions for potential emergency department visits. ----------------------------------------------------- Roger Olszewski, MD  05/12/2023 1:11 PM  [facility]:  [phone]

## 2023-05-12 NOTE — Assessment & Plan Note (Addendum)
Impacted Right Upper Wisdom Tooth: He is experiencing pain from an impacted right upper wisdom tooth, necessitating extraction. We discussed the risks associated with anesthesia, especially given his automated defibrillator and heart failure. Encouraged patient to to discuss with his cardiologist for clearance before undergoing dental surgery.  He was dizzy last month briefly with urinary tract infection (UTI) after turp, if labwork urinalysis good then the prior cardiac clearance is still valid in my medical opinion.  Offered to write letter if needed if that's the case without further appointment.

## 2023-05-12 NOTE — Assessment & Plan Note (Signed)
Seems maybe inadequate response to turp vs persistent urinary tract infection (UTI) vs medication(s) side effect(s)  Encouraged patient to to stay hydrated due to chronic kidney disease but be careful due to CHF which is currently volume controlled.

## 2023-05-12 NOTE — Patient Instructions (Addendum)
It was a pleasure seeing you today! Your health and satisfaction are our top priorities.   Roger Hew, MD  VISIT SUMMARY:  During your visit, we discussed your impacted right upper wisdom tooth, your recent episode of dizziness, and your ongoing urinary symptoms following your cystoscopy and TURP procedure. We also reviewed your current medications and made plans for refills.  YOUR PLAN:  -IMPACTED RIGHT UPPER WISDOM TOOTH: Your right upper wisdom tooth is causing you discomfort because it has not fully come in. You will need to have it removed. Given your heart condition and defibrillator, we will refer you to a cardiologist to ensure it's safe for you to undergo dental surgery.  -AUTOMATED DEFIBRILLATOR AND HEART FAILURE: Your heart condition has been stable since your defibrillator was replaced 6 months ago. We will continue with your current treatment plan.  -RECENT DIZZINESS: Your recent dizziness may be due to dehydration, possibly from your recent urinary tract infection and side effects from your medication. We will order blood work to check your kidney function and to make sure you don't have another urinary tract infection. Please make sure to stay hydrated, keeping in mind your heart condition.  -URINARY SYMPTOMS POST CYSTOSCOPY AND TURP: You're still experiencing frequent urination after your cystoscopy and TURP procedure. We will continue your Flomax medication to help with this.  -MEDICATION REFILLS: You requested refills for several of your medications. We will refill your Entresto, Spironolactone, Coreg, and try to get Jardiance. We will check your Vitamin D levels before refilling your Vitamin D3, and we will also refill your acid reflux medication.  INSTRUCTIONS:  We will closely monitor your kidney and heart conditions due to your multiple medications. Please schedule a follow-up appointment in 3 months, and remember to get your blood work done before that  appointment.    Next Steps:  [x]  Early Intervention: Schedule sooner appointment, call our on-call services, or go to emergency room if there is Increase in pain or discomfort New or worsening symptoms Sudden or severe changes in your health [x]  Flexible Follow-Up: We recommend a Return in about 3 months (around 08/12/2023) for chronic disease monitoring and management. for optimal routine care. This allows for progress monitoring and treatment adjustments. [x]  Preventive Care: Schedule your annual preventive care visit! It's typically covered by insurance and helps identify potential health issues early. [x]  Lab & X-ray Appointments: Incomplete tests scheduled today, or call to schedule. X-rays: [facility] (M-F, 8:30am-noon or 1pm-5pm). [x]  Medical Information Release: Sign a release form at front desk to obtain relevant medical information we don't have.  Making the Most of Our Focused (20 minute) Appointments:  [x]   Clearly state your top concerns at the beginning of the visit to focus our discussion [x]   If you anticipate you will need more time, please inform the front desk during scheduling - we can book multiple appointments in the same week. [x]   If you have transportation problems- use our convenient video appointments or ask about transportation support. [x]   We can get down to business faster if you use MyChart to update information before the visit and submit non-urgent questions before your visit. Thank you for taking the time to provide details through MyChart.  Let our nurse know and she can import this information into your encounter documents.  Arrival and Wait Times: [x]   Arriving on time ensures that everyone receives prompt attention. [x]   Early morning (8a) and afternoon (1p) appointments tend to have shortest wait times. [  x]  Unfortunately, we cannot delay appointments for late arrivals or hold slots during phone calls.  Getting Answers and Following  Up  [x]   Simple Questions & Concerns: For quick questions or basic follow-up after your visit, reach [place] at [phone] or MyChart messaging. [x]   Complex Concerns: If your concern is more complex, scheduling an appointment might be best. Discuss this with the staff to find the most suitable option. [x]   Lab & Imaging Results: We'll contact you directly if results are abnormal or you don't use MyChart. Most normal results will be on MyChart within 2-3 business days, with a review message from Dr. Jon Billings. Haven't heard back in 2 weeks? Need results sooner? Contact us at [phone]. [x]   Referrals: Our referral coordinator will manage specialist referrals. The specialist's office should contact you within 2 weeks to schedule an appointment. Call us if you haven't heard from them after 2 weeks.  Staying Connected  [x]   MyChart: Activate your MyChart for the fastest way to access results and message [place]. See the last page of this paperwork for instructions on how to activate.  Bring to Your Next Appointment  [x]   Medications: Please bring all your medication bottles to your next appointment to ensure we have an accurate record of your prescriptions. [x]   Health Diaries: If you're monitoring any health conditions at home, keeping a diary of your readings can be very helpful for discussions at your next appointment.  Billing  [x]   X-ray & Lab Orders: These are billed by separate companies. Contact the invoicing company directly for questions or concerns. [x]   Visit Charges: Discuss any billing inquiries with our administrative services team.  Your Satisfaction Matters  [x]   Share Your Experience: We strive for your satisfaction! If you have any complaints, or preferably compliments, please let Dr. Jon Billings know directly or contact our Practice Administrators, Edwena Felty or [employer], by asking at the front desk.   Reviewing Your Records  [x]   Review this early draft of your  clinical encounter notes below and the final encounter summary tomorrow on MyChart after its been completed.   Impacted third molar tooth Assessment & Plan: Impacted Right Upper Wisdom Tooth: He is experiencing pain from an impacted right upper wisdom tooth, necessitating extraction. We discussed the risks associated with anesthesia, especially given his automated defibrillator and heart failure. Encouraged patient to to discuss with his cardiologist for clearance before undergoing dental surgery.  He was dizzy last month briefly with urinary tract infection (UTI) after turp, if labwork urinalysis good then the prior cardiac clearance is still valid in my medical opinion.  Offered to write letter if needed if that's the case without further appointment.    AICD (automatic cardioverter/defibrillator) present  Chronic systolic CHF (congestive heart failure) (HCC) -     Urinalysis, Routine w reflex microscopic -     CBC with Differential/Platelet -     Comprehensive metabolic panel -     Carvedilol; Take 1.5 tablets (18.75 mg total) by mouth 2 (two) times daily.  Dispense: 90 tablet; Refill: 8 -     Empagliflozin; Take 1 tablet (10 mg total) by mouth daily before breakfast.  Dispense: 90 tablet; Refill: 3 -     Entresto; Take 1 tablet by mouth 2 (two) times daily.  Dispense: 180 tablet; Refill: 3 -     Spironolactone; Take 1 tablet (25 mg total) by mouth daily.  Dispense: 90 tablet; Refill: 3  Low vitamin D level  Chronic  heart failure, unspecified heart failure type (HCC)  NODULAR PROSTATE WITHOUT URINARY OBSTRUCTION -     Tamsulosin HCl; Take 1 capsule (0.4 mg total) by mouth in the morning.  Dispense: 90 capsule; Refill: 3  Vitamin D deficiency -     VITAMIN D 25 Hydroxy (Vit-D Deficiency, Fractures)  Stage 3 chronic kidney disease, unspecified whether stage 3a or 3b CKD (HCC) -     Urinalysis, Routine w reflex microscopic -     Comprehensive metabolic panel -     Magnesium -     Uric  acid  Gastroesophageal reflux disease without esophagitis -     Pepcid Complete; Chew 1 tablet by mouth daily as needed.  Dispense: 100 tablet; Refill: 11 -     Omeprazole; Take 1 capsule (20 mg total) by mouth daily.  Dispense: 30 capsule; Refill: 3  Urinary frequency Assessment & Plan: Seems maybe inadequate response to turp vs persistent urinary tract infection (UTI) vs medication(s) side effect(s)  Encouraged patient to to stay hydrated due to chronic kidney disease but be careful due to CHF which is currently volume controlled.

## 2023-05-14 ENCOUNTER — Encounter: Payer: Self-pay | Admitting: Internal Medicine

## 2023-05-14 DIAGNOSIS — R319 Hematuria, unspecified: Secondary | ICD-10-CM | POA: Insufficient documentation

## 2023-05-15 NOTE — Progress Notes (Signed)
Please call and notify, here's a possible script  "Great news! Dr.Narek Kniss reviewed your recent testing and everything looks good!"  "We recommend setting up your MyChart account to easily view your results and communicate with [place] securely.  Having a MyChart account makes it easy to view your results, see them sooner, review them in detail, and even send [place] messages about them. Would you like help setting it up?"  "For any questions or to discuss further, please let us know. We appreciate you trusting [place] with your care!"   We need to forward these results to his urologist Deitra Mayo on his care team

## 2023-05-20 ENCOUNTER — Telehealth: Payer: Self-pay

## 2023-05-20 NOTE — Telephone Encounter (Signed)
Reviewed with Dr. Graciela Husbands.  Patient AP <1%, he is not concerned.  Will continue to monitor.

## 2023-05-20 NOTE — Telephone Encounter (Signed)
Unscheduled manual transmission received.  Patient notifier(s) delivered.  Atrial lead impedance 480 ohms on 05/19/23; per trends it was approximately 2250 ohms on 05/06/23. Routed to triage for review.  3 AMS episodes, longest 16 sec.  Follow up as scheduled. [facility], [facility].  Patient has had periodic blips elevated RA impedance >2000ohms.  (Sensing and pacing thresholds have been WNL)   Gen change on 11/04/22. Patient AP <1% of the time.   We have been monitoring this since first trigger in February.  Reviewed with industry, Bryan [employer], recommends flagging for revision consideration.          Last RA PACING THRESHOLD ON 02/25/23 WAS 0.5v @ 0.5MS.

## 2023-05-28 ENCOUNTER — Other Ambulatory Visit (HOSPITAL_COMMUNITY): Payer: Self-pay

## 2023-05-28 NOTE — Progress Notes (Signed)
Remote ICD transmission.   

## 2023-06-01 ENCOUNTER — Ambulatory Visit: Payer: Medicaid Other | Admitting: Critical Care Medicine

## 2023-06-01 NOTE — Progress Notes (Deleted)
New Patient Office Visit  Subjective    Patient ID: Roger Crosby, male    DOB: September 28, 1961  Age: 62 y.o. MRN: [id number]  CC: No chief complaint on file.   HPI Roger Crosby presents to establish care ***  Outpatient Encounter Medications as of 06/01/2023  Medication Sig   aspirin EC 81 MG tablet Take 1 tablet (81 mg total) by mouth daily.   atorvastatin (LIPITOR) 20 MG tablet Take 1 tablet every day by oral route at bedtime for 90 days.   carvedilol (COREG) 12.5 MG tablet Take 1.5 tablets (18.75 mg total) by mouth 2 (two) times daily.   Cholecalciferol (VITAMIN D3) 125 MCG (5000 UT) capsule Take 1 capsule (5,000 Units total) by mouth daily.   empagliflozin (JARDIANCE) 10 MG TABS tablet Take 1 tablet (10 mg total) by mouth daily before breakfast.   famotidine-calcium carbonate-magnesium hydroxide (PEPCID COMPLETE) 10-800-165 MG chewable tablet Chew 1 tablet by mouth daily as needed.   furosemide (LASIX) 20 MG tablet Take 1 tablet (20 mg total) by mouth daily.   omeprazole (PRILOSEC) 20 MG capsule Take 1 capsule (20 mg total) by mouth daily.   sacubitril-valsartan (ENTRESTO) 97-103 MG Take 1 tablet by mouth 2 (two) times daily.   spironolactone (ALDACTONE) 25 MG tablet Take 1 tablet (25 mg total) by mouth daily.   tamsulosin (FLOMAX) 0.4 MG CAPS capsule Take 1 capsule (0.4 mg total) by mouth in the morning.   No facility-administered encounter medications on file as of 06/01/2023.    Past Medical History:  Diagnosis Date   AICD (automatic cardioverter/defibrillator) present    a.  Unsuccessful LV lead placement 11/2014   Atypical chest pain 04/07/2016   CAD (coronary artery disease)    a. nonobstructive by [facility] 6/11: oOM1 40%, mild luminal irregs elsewhere b. cath 09/2014L no obstructive disease c. 03/2016: Low-risk NST   Chest pain 03/11/2018   CHF (congestive heart failure) (HCC)    CKD (chronic kidney disease)    Cocaine use 07/04/2019   Dribbling of urine 12/18/2022   DYSPNEA  04/04/2010   Qualifier: Diagnosis of   By: Huntley Dec, Scott       Elevated red blood cell count 12/21/2022   Lab Results  Component  Value  Date/Time     HGB  14.9  10/16/2022 10:06 AM     HGB  15.9  10/20/2021 12:08 PM     HGB  14.6  07/06/2019 02:20 AM     HGB  14.9  07/05/2019 06:57 AM     HGB  15.1  07/04/2019 04:15 AM     HGB  15.3  03/11/2018 02:00 AM         Elevated troponin 07/04/2019   GERD (gastroesophageal reflux disease)    High cholesterol    History of drug abuse (HCC) 12/18/2022   cocaine   HTN (hypertension)    Hx of echocardiogram    echo 5/11: EF 45-50%, mild HK of Ap AS wall, ap Ant wall and true apex (LAD distribution), mild LAE   LBBB (left bundle branch block)    LBP (low back pain)    NICM (nonischemic cardiomyopathy) (HCC)    Normocytic hypochromic anemia 12/21/2022   S/P ICD (internal cardiac defibrillator) procedure 02/07/2015   Substance abuse (HCC) 04/08/2016   Systolic heart failure (HCC)    Tobacco dependence 07/26/2020    Past Surgical History:  Procedure Laterality Date   BI-VENTRICULAR IMPLANTABLE CARDIOVERTER DEFIBRILLATOR N/A 12/14/2014   with failed LV lead  placeemnt   BIV ICD GENERATOR CHANGEOUT N/A 11/04/2022   Procedure: BIV ICD GENERATOR CHANGEOUT;  Surgeon: Duke Salvia, MD;  Location: [facility] INVASIVE CV LAB;  Service: Cardiovascular;  Laterality: N/A;   CARDIAC CATHETERIZATION Left 07/31/2013   with angiogram   DEBRIDEMENT AND CLOSURE WOUND Right 09/20/2015   Procedure: DEBRIDEMENT AND CLOSURE WOUND;  Surgeon: Knute Neu, MD;  Location: [facility] OR;  Service: Plastics;  Laterality: Right;   EPICARDIAL PACING LEAD PLACEMENT N/A 02/07/2015   eicardial LV lead placed by Dr Renato Battles   LEFT HEART CATH AND CORONARY ANGIOGRAPHY N/A 07/05/2019   Procedure: LEFT HEART CATH AND CORONARY ANGIOGRAPHY;  Surgeon: Laurey Morale, MD;  Location: [facility] INVASIVE CV LAB;  Service: Cardiovascular;  Laterality: N/A;   LEFT HEART CATHETERIZATION WITH CORONARY  ANGIOGRAM N/A 08/04/2013   Procedure: LEFT HEART CATHETERIZATION WITH CORONARY ANGIOGRAM;  Surgeon: Laurey Morale, MD;  Location: [facility] CATH LAB;  Service: Cardiovascular;  Laterality: N/A;   PROSTATE SURGERY  02/15/2023   turbt and cystoscopy Dr. Deitra Mayo   TENDON REPAIR Left 09/20/2015   Procedure: repair nerve tendon wrist;  Surgeon: Knute Neu, MD;  Location: [facility] OR;  Service: Plastics;  Laterality: Left;   THORACOTOMY Left 02/07/2015   Procedure: THORACOTOMY MAJOR;  Surgeon: Alleen Borne, MD;  Location: [facility] OR;  Service: Thoracic;  Laterality: Left;    Family History  Problem Relation Age of Onset   Heart attack Mother        in 55's   Heart disease Mother        ischemic   Congestive Heart Failure Mother    Diabetes Mother    Hypertension Mother    Heart attack Father        in 15's   Heart disease Father        ischemic   Heart Problems Father    Crohn's disease Sister    Prostate cancer Brother    HIV/AIDS Brother     Social History   Socioeconomic History   Marital status: Married    Spouse name: Not on file   Number of children: 3   Years of education: 12 grade   Highest education level: Not on file  Occupational History   Occupation: disability  Tobacco Use   Smoking status: Former    Packs/day: 0.12    Years: 25.00    Additional pack years: 0.00    Total pack years: 3.00    Types: Cigarettes   Smokeless tobacco: Never  Vaping Use   Vaping Use: Never used  Substance and Sexual Activity   Alcohol use: Yes    Comment: occasionally   Drug use: Not Currently    Types: "Crack" cocaine    Comment: clean x 6 mths   Sexual activity: Yes  Other Topics Concern   Not on file  Social History Narrative   Not on file   Social Determinants of Health   Financial Resource Strain: Not on file  Food Insecurity: Not on file  Transportation Needs: Unmet Transportation Needs (12/23/2022)   PRAPARE - Administrator, Civil Service (Medical): Yes     Lack of Transportation (Non-Medical): Yes  Physical Activity: Not on file  Stress: Not on file  Social Connections: Not on file  Intimate Partner Violence: Not on file    ROS      Objective    There were no vitals taken for this visit.  Physical Exam  {Labs (Optional):23779}  Assessment & Plan:   Problem List Items Addressed This Visit   None   No follow-ups on file.   Shan Levans, MD

## 2023-08-05 ENCOUNTER — Ambulatory Visit (INDEPENDENT_AMBULATORY_CARE_PROVIDER_SITE_OTHER): Payer: MEDICAID

## 2023-08-05 DIAGNOSIS — I428 Other cardiomyopathies: Secondary | ICD-10-CM

## 2023-08-06 LAB — CUP PACEART REMOTE DEVICE CHECK
Battery Remaining Longevity: 71 mo
Battery Remaining Percentage: 86 %
Battery Voltage: 3.05 V
Brady Statistic AP VP Percent: 2 %
Brady Statistic AP VS Percent: 1 %
Brady Statistic AS VP Percent: 95 %
Brady Statistic AS VS Percent: 2.7 %
Brady Statistic RA Percent Paced: 1.9 %
Date Time Interrogation Session: 20240906015041
HighPow Impedance: 69 Ohm
HighPow Impedance: 69 Ohm
Implantable Lead Connection Status: 753985
Implantable Lead Connection Status: 753985
Implantable Lead Connection Status: 753985
Implantable Lead Implant Date: 20160115
Implantable Lead Implant Date: 20160115
Implantable Lead Implant Date: 20160310
Implantable Lead Location: 753858
Implantable Lead Location: 753859
Implantable Lead Location: 753860
Implantable Lead Model: 5076
Implantable Lead Model: 511212
Implantable Lead Serial Number: 247936
Implantable Pulse Generator Implant Date: 20231206
Lead Channel Impedance Value: 360 Ohm
Lead Channel Impedance Value: 480 Ohm
Lead Channel Impedance Value: 510 Ohm
Lead Channel Pacing Threshold Amplitude: 0.5 V
Lead Channel Pacing Threshold Amplitude: 1 V
Lead Channel Pacing Threshold Amplitude: 1 V
Lead Channel Pacing Threshold Pulse Width: 0.5 ms
Lead Channel Pacing Threshold Pulse Width: 0.5 ms
Lead Channel Pacing Threshold Pulse Width: 0.5 ms
Lead Channel Sensing Intrinsic Amplitude: 12 mV
Lead Channel Sensing Intrinsic Amplitude: 4 mV
Lead Channel Setting Pacing Amplitude: 2 V
Lead Channel Setting Pacing Amplitude: 2 V
Lead Channel Setting Pacing Amplitude: 2 V
Lead Channel Setting Pacing Pulse Width: 0.5 ms
Lead Channel Setting Pacing Pulse Width: 0.5 ms
Lead Channel Setting Sensing Sensitivity: 0.5 mV
Pulse Gen Serial Number: 5550090

## 2023-08-12 ENCOUNTER — Telehealth: Payer: Self-pay | Admitting: Internal Medicine

## 2023-08-12 ENCOUNTER — Ambulatory Visit: Payer: MEDICAID | Admitting: Internal Medicine

## 2023-08-12 NOTE — Telephone Encounter (Signed)
Called to inform patient of no show for his appointment this morning with PCP as well as our no show policy. Unfortunately, I received an automated message stating the patient cannot take calls at this time.

## 2023-08-12 NOTE — Progress Notes (Signed)
Remote ICD transmission.   

## 2023-08-24 ENCOUNTER — Telehealth: Payer: Self-pay | Admitting: Internal Medicine

## 2023-08-24 NOTE — Telephone Encounter (Signed)
Patient has moved-has new Preferred Pharmacy listed below and in chart. Requests new RX's be sent as follows:  Prescription Request  08/24/2023  LOV: 05/12/2023  What is the name of the medication or equipment? atorvastatin (LIPITOR) 20 MG tablet   sacubitril-valsartan (ENTRESTO) 97-103 MG   spironolactone (ALDACTONE) 25 MG tablet   omeprazole (PRILOSEC) 20 MG capsule   Have you contacted your pharmacy to request a refill? Yes   Which pharmacy would you like this sent to?   [id number] [place], [place] - [address] AT [facility] [address] [place] [address] Phone: [phone] Fax: [phone]    Patient notified that their request is being sent to the clinical staff for review and that they should receive a response within 2 business days.   Please advise at Mobile [phone] (mobile)

## 2023-08-24 NOTE — Telephone Encounter (Signed)
Confirmed with patient's pharmacy on file that he has enough refills for all of these medications, informed patient.

## 2023-09-01 ENCOUNTER — Ambulatory Visit: Payer: MEDICAID | Admitting: Internal Medicine

## 2023-09-03 ENCOUNTER — Ambulatory Visit: Payer: MEDICAID

## 2023-09-03 DIAGNOSIS — I428 Other cardiomyopathies: Secondary | ICD-10-CM

## 2023-09-07 LAB — CUP PACEART REMOTE DEVICE CHECK
Battery Remaining Longevity: 71 mo
Battery Remaining Percentage: 86 %
Battery Voltage: 3.04 V
Brady Statistic AP VP Percent: 2 %
Brady Statistic AP VS Percent: 1 %
Brady Statistic AS VP Percent: 95 %
Brady Statistic AS VS Percent: 2.6 %
Brady Statistic RA Percent Paced: 1.9 %
Date Time Interrogation Session: 20241004190056
HighPow Impedance: 71 Ohm
HighPow Impedance: 71 Ohm
Implantable Lead Connection Status: 753985
Implantable Lead Connection Status: 753985
Implantable Lead Connection Status: 753985
Implantable Lead Implant Date: 20160115
Implantable Lead Implant Date: 20160115
Implantable Lead Implant Date: 20160310
Implantable Lead Location: 753858
Implantable Lead Location: 753859
Implantable Lead Location: 753860
Implantable Lead Model: 5076
Implantable Lead Model: 511212
Implantable Lead Serial Number: 247936
Implantable Pulse Generator Implant Date: 20231206
Lead Channel Impedance Value: 360 Ohm
Lead Channel Impedance Value: 540 Ohm
Lead Channel Impedance Value: 540 Ohm
Lead Channel Pacing Threshold Amplitude: 0.5 V
Lead Channel Pacing Threshold Amplitude: 1 V
Lead Channel Pacing Threshold Amplitude: 1 V
Lead Channel Pacing Threshold Pulse Width: 0.5 ms
Lead Channel Pacing Threshold Pulse Width: 0.5 ms
Lead Channel Pacing Threshold Pulse Width: 0.5 ms
Lead Channel Sensing Intrinsic Amplitude: 12 mV
Lead Channel Sensing Intrinsic Amplitude: 4 mV
Lead Channel Setting Pacing Amplitude: 2 V
Lead Channel Setting Pacing Amplitude: 2 V
Lead Channel Setting Pacing Amplitude: 2 V
Lead Channel Setting Pacing Pulse Width: 0.5 ms
Lead Channel Setting Pacing Pulse Width: 0.5 ms
Lead Channel Setting Sensing Sensitivity: 0.5 mV
Pulse Gen Serial Number: 5550090

## 2023-09-15 NOTE — Progress Notes (Signed)
Remote ICD transmission.   

## 2023-09-20 ENCOUNTER — Ambulatory Visit: Payer: MEDICAID | Admitting: Internal Medicine

## 2023-10-20 ENCOUNTER — Ambulatory Visit (INDEPENDENT_AMBULATORY_CARE_PROVIDER_SITE_OTHER): Payer: MEDICAID | Admitting: Internal Medicine

## 2023-10-20 ENCOUNTER — Encounter: Payer: Self-pay | Admitting: Internal Medicine

## 2023-10-20 VITALS — BP 118/83 | HR 95 | Temp 98.6°F | Ht 68.0 in | Wt 227.4 lb

## 2023-10-20 DIAGNOSIS — G8929 Other chronic pain: Secondary | ICD-10-CM | POA: Diagnosis not present

## 2023-10-20 DIAGNOSIS — E785 Hyperlipidemia, unspecified: Secondary | ICD-10-CM

## 2023-10-20 DIAGNOSIS — N183 Chronic kidney disease, stage 3 unspecified: Secondary | ICD-10-CM

## 2023-10-20 DIAGNOSIS — E559 Vitamin D deficiency, unspecified: Secondary | ICD-10-CM | POA: Diagnosis not present

## 2023-10-20 DIAGNOSIS — R7303 Prediabetes: Secondary | ICD-10-CM

## 2023-10-20 DIAGNOSIS — K219 Gastro-esophageal reflux disease without esophagitis: Secondary | ICD-10-CM | POA: Diagnosis not present

## 2023-10-20 DIAGNOSIS — I152 Hypertension secondary to endocrine disorders: Secondary | ICD-10-CM

## 2023-10-20 DIAGNOSIS — R7989 Other specified abnormal findings of blood chemistry: Secondary | ICD-10-CM | POA: Diagnosis not present

## 2023-10-20 DIAGNOSIS — R519 Headache, unspecified: Secondary | ICD-10-CM | POA: Diagnosis not present

## 2023-10-20 DIAGNOSIS — I5022 Chronic systolic (congestive) heart failure: Secondary | ICD-10-CM

## 2023-10-20 DIAGNOSIS — N402 Nodular prostate without lower urinary tract symptoms: Secondary | ICD-10-CM

## 2023-10-20 DIAGNOSIS — R8290 Unspecified abnormal findings in urine: Secondary | ICD-10-CM

## 2023-10-20 DIAGNOSIS — I447 Left bundle-branch block, unspecified: Secondary | ICD-10-CM

## 2023-10-20 DIAGNOSIS — G4733 Obstructive sleep apnea (adult) (pediatric): Secondary | ICD-10-CM

## 2023-10-20 MED ORDER — SPIRONOLACTONE 25 MG PO TABS: 25.0000 mg | ORAL_TABLET | Freq: Every day | ORAL | 3 refills | Status: DC

## 2023-10-20 MED ORDER — OMEPRAZOLE 20 MG PO CPDR: 20.0000 mg | DELAYED_RELEASE_CAPSULE | Freq: Every day | ORAL | 3 refills | Status: DC

## 2023-10-20 MED ORDER — PEPCID COMPLETE 10-800-165 MG PO CHEW: 1.0000 | CHEWABLE_TABLET | Freq: Every day | ORAL | 11 refills | Status: DC | PRN

## 2023-10-20 MED ORDER — CARVEDILOL 12.5 MG PO TABS: 18.7500 mg | ORAL_TABLET | Freq: Two times a day (BID) | ORAL | 3 refills | Status: DC

## 2023-10-20 MED ORDER — VITAMIN D3 125 MCG (5000 UT) PO CAPS: 5000.0000 [IU] | ORAL_CAPSULE | Freq: Every day | ORAL | 3 refills | Status: DC

## 2023-10-20 MED ORDER — VITAMIN D (ERGOCALCIFEROL) 1.25 MG (50000 UNIT) PO CAPS: 50000.0000 [IU] | ORAL_CAPSULE | ORAL | 1 refills | Status: DC

## 2023-10-20 MED ORDER — ENTRESTO 97-103 MG PO TABS: 1.0000 | ORAL_TABLET | Freq: Two times a day (BID) | ORAL | 3 refills | Status: DC

## 2023-10-20 MED ORDER — ATORVASTATIN CALCIUM 20 MG PO TABS: ORAL_TABLET | ORAL | 3 refills | Status: DC

## 2023-10-20 MED ORDER — ASPIRIN 81 MG PO TBEC: 81.0000 mg | DELAYED_RELEASE_TABLET | Freq: Every day | ORAL | 2 refills | Status: AC

## 2023-10-20 MED ORDER — TAMSULOSIN HCL 0.4 MG PO CAPS
0.4000 mg | ORAL_CAPSULE | Freq: Every morning | ORAL | 3 refills | Status: DC
Start: 2023-10-20 — End: 2024-10-22

## 2023-10-20 MED ORDER — EMPAGLIFLOZIN 10 MG PO TABS: 10.0000 mg | ORAL_TABLET | Freq: Every day | ORAL | 3 refills | Status: DC

## 2023-10-20 MED ORDER — FUROSEMIDE 20 MG PO TABS: 20.0000 mg | ORAL_TABLET | Freq: Every day | ORAL | 3 refills | Status: AC

## 2023-10-20 NOTE — Patient Instructions (Addendum)
AFTER VISIT SUMMARY  Thank you for visiting [place] today. Here's a summary of your visit and important instructions:  YOUR HEALTH UPDATES:  1. Headaches    - We're taking your new headaches seriously and have ordered:      * MRI of your brain      * Sleep study      * Appointment with a headache specialist    - Continue Tylenol if needed (maximum 8 regular strength tablets per day)    - Do not use Excedrin due to your heart condition  2. Heart Health    - Your heart function is stable    - Continue taking Lasix as needed (about once weekly is fine)    - Watch for:      * Increased swelling in legs      * Difficulty breathing      * Chest pain    - Avoid salty foods  3. Blood Pressure    - Your blood pressure was good today    - Please get a home blood pressure monitor    - Keep a log of your readings to share at next visit  4. Vitamin D    - Starting new vitamin D prescription    - Take ONE capsule WEEKLY (not daily)    - This will help with your energy levels  MEDICATION CHANGES: - New prescription for vitamin D 50,000 units - take once weekly - All other medications renewed for 90 days  IMPORTANT APPOINTMENTS TO SCHEDULE: 1. MRI of brain 2. Sleep study 3. Neurologist consultation 4. Defibrillator check 5. Follow-up visit in 3-6 months  EMERGENCY WARNING SIGNS: Get immediate medical attention if you experience: - Severe headache that comes on suddenly - Confusion or difficulty speaking - Weakness on one side of your body - Chest pain or severe shortness of breath  LIFESTYLE RECOMMENDATIONS: - Monitor your salt intake - Keep track of your blood pressure - Maintain your strong recovery practices - Get regular sleep when possible - Stay well hydrated  We're here to support your health journey. Call us if you have any concerns before your next visit.

## 2023-10-20 NOTE — Assessment & Plan Note (Signed)
New onset headache in patient over 50 with positional component and morning predominance. Age and postural component are red flags necessitating neuroimaging. MRI brain with contrast ordered to rule out intracranial pathology Sleep study ordered given history of untreated sleep apnea Neurology referral placed for comprehensive headache evaluation Continue Tylenol prn, not to exceed 2000mg  daily Avoid Excedrin due to cardiac history Monitor for worsening symptoms or new neurological changes

## 2023-10-20 NOTE — Assessment & Plan Note (Signed)
Clinically stable with patient reporting minimal baseline fluid retention Patient self-reduced Lasix to approximately once weekly without complications Reports excessive urination with more frequent Lasix dosing ("five times in ten minutes") Current management strategy effective with once weekly Lasix use Discussed medication adjustments:  Advised against Excedrin due to cardiac effects Maintaining Lasix prn for fluid management Patient avoiding raw salt in diet   Remote monitoring of defibrillator occurs every 1-3 months at home In-person defibrillator check due Education provided:  Continue current sodium restriction Monitor for fluid retention Maintain home monitoring schedule for defibrillator Importance of avoiding certain OTC medications

## 2023-10-20 NOTE — Progress Notes (Signed)
[facility]: [phone]   -- Medical Office Visit --  Patient:  Roger Crosby      Age: 62 y.o.       Sex:  male  Date:   10/20/2023 Today's Healthcare Provider: Lula Olszewski, MD  ==========================================================================     Assessment & Plan Chronic intractable headache, unspecified headache type New onset headache in patient over 50 with positional component and morning predominance. Age and postural component are red flags necessitating neuroimaging. MRI brain with contrast ordered to rule out intracranial pathology Sleep study ordered given history of untreated sleep apnea Neurology referral placed for comprehensive headache evaluation Continue Tylenol prn, not to exceed 2000mg  daily Avoid Excedrin due to cardiac history Monitor for worsening symptoms or new neurological changes OSA (obstructive sleep apnea)  Gastroesophageal reflux disease without esophagitis Symptoms well controlled with prn omeprazole Continue current regimen Avoid trigger foods 90-day prescription provided Put info in AVS to encouraged patient to taper off, prior attempt to shift to Pepcid complete he didn't get over price NODULAR PROSTATE WITHOUT URINARY OBSTRUCTION  Low vitamin D level  Chronic systolic CHF (congestive heart failure) (HCC) Clinically stable with patient reporting minimal baseline fluid retention Patient self-reduced Lasix to approximately once weekly without complications Reports excessive urination with more frequent Lasix dosing ("five times in ten minutes") Current management strategy effective with once weekly Lasix use Discussed medication adjustments:  Advised against Excedrin due to cardiac effects Maintaining Lasix prn for fluid management Patient avoiding raw salt in diet   Remote monitoring of defibrillator occurs every 1-3 months at home In-person defibrillator check due Education provided:  Continue  current sodium restriction Monitor for fluid retention Maintain home monitoring schedule for defibrillator Importance of avoiding certain OTC medications Stage 3 chronic kidney disease, unspecified whether stage 3a or 3b CKD (HCC)  Abnormal urinalysis  Prediabetes  Hyperlipidemia, unspecified hyperlipidemia type  Hypertension due to endocrine disorder Current office BP at goal Continue current medication regimen Recommended obtaining home BP monitor Will reassess need for medication adjustment once home readings available LBBB (left bundle branch block)  Vitamin D deficiency Lab confirmed deficiency  Lab Results  Component Value Date   VD25OH 18.65 (L) 05/12/2023   VD25OH 13.93 (L) 02/25/2023    Starting Vitamin D 50,000 units weekly Emphasized proper dosing schedule Will recheck levels at follow-up  Encounter Orders:           Ordered    MR Brain W Wo Contrast        10/20/23 1431    Ambulatory referral to Sleep Studies  Status:  Canceled        10/20/23 1431    famotidine-calcium carbonate-magnesium hydroxide (PEPCID COMPLETE) [phone] MG chewable tablet  Daily PRN       Note to Pharmacy: Must be berry flavor.   10/20/23 1437    tamsulosin (FLOMAX) 0.4 MG CAPS capsule  Every morning        10/20/23 1437    omeprazole (PRILOSEC) 20 MG capsule  Daily        10/20/23 1437    Cholecalciferol (VITAMIN D3) 125 MCG (5000 UT) CAPS  Daily,   Status:  Discontinued        10/20/23 1437    empagliflozin (JARDIANCE) 10 MG TABS tablet  Daily before breakfast        10/20/23 1437    furosemide (LASIX) 20 MG tablet  Daily        10/20/23 1437    spironolactone (ALDACTONE) 25 MG  tablet  Daily        10/20/23 1437    aspirin EC 81 MG tablet  Daily        10/20/23 1437    sacubitril-valsartan (ENTRESTO) 97-103 MG  2 times daily       Note to Pharmacy: Please cancel all previous orders for current medication. Change in dosage or pill size.   10/20/23 1437    atorvastatin  (LIPITOR) 20 MG tablet        10/20/23 1437    carvedilol (COREG) 12.5 MG tablet  2 times daily        10/20/23 1437    Vitamin D, Ergocalciferol, (DRISDOL) 1.25 MG (50000 UNIT) CAPS capsule  Every 7 days        10/20/23 1441    Protein / creatinine ratio, urine        10/20/23 1441    Microalbumin / creatinine urine ratio       Comments: [place]    10/20/23 1441    Urinalysis, Routine w reflex microscopic        10/20/23 1441    Magnesium        10/20/23 1441    Vitamin D (25 hydroxy)        10/20/23 1441    Comp Met (CMET)        10/20/23 1441    Uric acid        10/20/23 1441    Urinalysis, Routine w reflex microscopic        10/20/23 1441    Ambulatory referral to Neurology       Comments: Postural headache, new, over 50yo, worse with standing.  History of  Obstructive sleep apnea - MRI pending.   10/20/23 1442    Ambulatory referral to Sleep Studies        10/20/23 1452    Lipid Panel w/reflex Direct LDL        10/20/23 1455    CBC with Differential/Platelet        10/20/23 1457    HgB A1c        10/20/23 1457           FOLLOW-UP:  Neurology consultation within 2 weeks Sleep medicine consultation Routine follow-up in 3-6 months sooner for any worsening symptoms Coordinate defibrillator check    SUBJECTIVE: 62 y.o. male who has Hyperlipidemia; ERECTILE DYSFUNCTION; Hypertension; LBBB (left bundle branch block); GERD; Nodular prostate without urinary obstruction; Hypertrophic and atrophic condition of skin; LOW BACK PAIN; LEG PAIN; Chronic systolic CHF (congestive heart failure) (HCC); NICM (nonischemic cardiomyopathy) (HCC); CKD (chronic kidney disease) stage 3, GFR 30-59 ml/min (HCC); Obesity (BMI 30.0-34.9); Class 2 obesity due to excess calories without serious comorbidity with body mass index (BMI) of 35.0 to 35.9 in adult; Polyuria; AICD (automatic cardioverter/defibrillator) present; OSA (obstructive sleep apnea); Prediabetes; Mood disorder (HCC);  Laceration of left median nerve; Heart failure (HCC); Benign prostatic hyperplasia with urinary obstruction; Former smoker; Not currently working due to disabled status; Vitamin D deficiency; Impacted third molar tooth; Urinary frequency; Hematuria; Low vitamin D level; Chronic intractable headache; and Abnormal urinalysis on their problem list.  Main reasons for visit/main concerns/chief complaint: 5 month follow-up (Requesting 90 days of medication due to transportation issues. Wants to try mail order pharmacy.)   AI-Extracted: Discussed the use of AI scribe software for clinical note transcription with the patient, who gave verbal consent to proceed.  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 The headaches began recently and are primarily located at the top of the forehead. Pain is described as moderate in intensity, not severely painful but concerning enough to impact sleep. Symptoms are most notable upon awakening and with positional changes, particularly when moving from lying to standing. Associated symptoms include occasional nausea. Patient has been self-treating with Tylenol with some relief, taking up to 4 tablets daily.  Patient reports history of diagnosed sleep apnea but is not currently using CPAP therapy. He describes poor sleep patterns, typically sleeping only 1-2 hours at a time. He has been using OTC gummies to aid sleep.  Of note, patient experienced recent unintentional cocaine exposure at a social gathering where drinks were contaminated without his knowledge. This has caused significant emotional distress given his 8-year history of sustained remission from substance use. Patient demonstrates strong commitment to recovery and expresses appropriate concern about this involuntary exposure.  Additionally, patient reports intermittent  episodes of perceived blood pressure elevation, which he has been managing with vinegar consumption. He questions whether current antihypertensive medication dosing remains adequate. He lacks a home blood pressure monitor for objective measurements.  Patient also notes plans for extensive dental work due to poor dentition causing halitosis and discomfort.  PERTINENT REVIEW OF SYSTEMS: Positive for: - Headaches - Sleep disturbance - Nausea - Dental problems - Emotional distress  Negative for: - Chest pain - Shortness of breath - Lower extremity edema - Visual changes - Dizziness   Note that patient  has a past medical history of AICD (automatic cardioverter/defibrillator) present, Atypical chest pain (04/07/2016), CAD (coronary artery disease), Chest pain (03/11/2018), CHF (congestive heart failure) (HCC), CKD (chronic kidney disease), Cocaine use (07/04/2019), Dribbling of urine (12/18/2022), DYSPNEA (04/04/2010), Elevated red blood cell count (12/21/2022), Elevated troponin (07/04/2019), GERD (gastroesophageal reflux disease), High cholesterol, History of drug abuse (HCC) (12/18/2022), HTN (hypertension), echocardiogram, LBBB (left bundle branch block), LBP (low back pain), NICM (nonischemic cardiomyopathy) (HCC), Normocytic hypochromic anemia (12/21/2022), S/P ICD (internal cardiac defibrillator) procedure (02/07/2015), Substance abuse (HCC) (04/08/2016), Systolic heart failure (HCC), and Tobacco dependence (07/26/2020).  Problem list overviews that were updated at today's visit: Problem  Low Vitamin D Level  Chronic Intractable Headache  Abnormal Urinalysis  Vitamin D Deficiency    Lab Results  Component Value Date/Time   VD25OH 18.65 (L) 05/12/2023 11:05 AM   VD25OH 13.93 (L) 02/25/2023 11:23 AM      Osa (Obstructive Sleep Apnea)   Was supposed to return to sleep clinic but did not   Ckd (Chronic Kidney Disease) Stage 3, Gfr 30-59 Ml/Min (Hcc)   Nephrologist: No care team  member to display  Medicines:     ARB/ACEI:  entresto    SGLT2 inhibitor:   jardiance  Imaging: never done  Labwork:  Creatinine, Ser (mg/dL)  Date Value  09/81/1914 1.35  02/25/2023 1.34  02/25/2023 1.25  01/05/2023 1.25 (H)  10/16/2022 1.56 (H)  10/20/2021 1.45 (H)  07/03/2020 1.32 (H)  04/09/2020 1.65 (H)  07/06/2019 1.52 (H)  07/05/2019 1.37 (H)  07/05/2019 1.44 (H)  07/04/2019 1.66 (H)  07/05/2018 1.46 (H)  03/13/2018 1.43 (H)  03/12/2018 1.55 (H)  03/11/2018 1.61 (H)  06/09/2016 1.59 (H)  04/12/2016 1.58 (H)  04/08/2016 1.45 (H)  04/06/2016 1.43 (H)  01/23/2016 1.45 (H)  09/28/2015 1.39 (H)  09/20/2015 1.45 (H)  09/19/2015 1.40 (H)  02/08/2015 1.16  02/05/2015 1.36 (H)  01/17/2015 1.26  12/10/2014 1.3  10/24/2014 1.24  08/22/2014 1.38 (H)  08/21/2014 1.44 (H)  12/21/2013 1.55 (H)  12/14/2013 1.33  10/05/2013 1.5  09/06/2013 1.3  08/15/2013 1.6 (H)  08/01/2013 1.4  06/20/2013 1.5  05/22/2013 1.7 (H)  09/05/2012 1.4  04/29/2010 1.4  04/24/2010 1.7 (H)  02/14/2010 1.47  01/07/2010 1.04  01/28/2009 1.26   GFR (mL/min)  Date Value  05/12/2023 56.40 (L)  02/25/2023 56.99 (L)  12/10/2014 72.75  10/05/2013 62.58  09/06/2013 73.10  08/15/2013 60.75  08/01/2013 67.77  06/20/2013 64.12  05/22/2013 54.66 (L)  09/05/2012 66.92   eGFR  Date Value  02/25/2023 74 mL/min/1.23m2  02/25/2023 66 mL/min/1.73  10/16/2022 50 mL/min/1.73 (L)    Lab Results  Component Value Date   LABURIC 4.3 05/12/2023   VD25OH 18.65 (L) 05/12/2023   NA 138 05/12/2023   K 4.7 05/12/2023   CL 105 05/12/2023   CO2 24 05/12/2023   CALCIUM 9.7 05/12/2023   PROT 7.2 05/12/2023   ALBUMIN 4.2 05/12/2023   HGB 14.7 05/12/2023   HCT 46.6 05/12/2023   PSA 0.69 03/15/2015   HGBA1C 6.3 02/25/2023   Lab Results  Component Value Date   COLORURINE Lt. Yellow 05/12/2023   APPEARANCEUR CLEAR 05/12/2023   LABSPEC 1.020 05/12/2023   PHURINE 6.0 05/12/2023   GLUCOSEU  NEGATIVE 05/12/2023   HGBUR TRACE-LYSED (A) 05/12/2023   BILIRUBINUR NEGATIVE 05/12/2023   KETONESUR NEGATIVE 05/12/2023   PROTEINUR NEGATIVE 07/04/2019   UROBILINOGEN 0.2 05/12/2023   NITRITE NEGATIVE 05/12/2023   LEUKOCYTESUR NEGATIVE 05/12/2023   MICROALBUR 20.2 (H) 02/25/2023          Chronic Systolic Chf (Congestive Heart Failure) (Hcc)   New aicd December 2023 following with electrophysiology   Patient reports that they are not experiencing any leg swelling Relevant Results: Wt Readings from Last 3 Encounters:  10/20/23 227 lb 6.4 oz (103.1 kg)  05/12/23 235 lb (106.6 kg)  02/25/23 235 lb (106.6 kg)      03/11/2018    2:59 PM 10/24/2014   11:08 AM  Left Ventricular Function  LVF 50% - 55% 25% - 30%      08/24/2023   12:00 AM 09/01/2023   12:00 AM 09/03/2023   12:00 AM 09/20/2023   12:00 AM 10/20/2023   12:00 AM  Heart Failure Medications  Carvedilol Take 1.5 tablets (18.75 mg total) by mouth 2 (two) times daily.    Take 1.5 tablets (18.75 mg total) by mouth 2 (two) times daily.    Take 1.5 tablets (18.75 mg total) by mouth 2 (two) times daily.    Take 1.5 tablets (18.75 mg total) by mouth 2 (two) times daily.    Take 1.5 tablets (18.75 mg total) by mouth 2 (two) times daily.     Carvedilol     Take 1.5 tablets (18.75 mg total) by mouth 2 (two) times daily.        Medication marked as long-term   @CHFCOMP2 @    Component Value Date/Time   CHOL 169 02/25/2023 1123   TRIG 98.0 02/25/2023 1123   HDL 43.00 02/25/2023 1123   CHOLHDL 4 02/25/2023 1123   VLDL 19.6 02/25/2023 1123   LDLCALC 106 (H) 02/25/2023 1123   LDLDIRECT 155.3 04/24/2010 1618   Lab Results  Component Value Date   GLUCOSE 115 (H) 05/12/2023   NA 138 05/12/2023   K 4.7 05/12/2023   CL 105 05/12/2023   CO2 24 05/12/2023   BUN 16 05/12/2023   CREATININE 1.35 05/12/2023   GFRNONAA >60  01/05/2023   GFRAA >60 07/03/2020   CALCIUM 9.7 05/12/2023   PHOS 3.3 02/25/2023   PROT 7.2 05/12/2023    ALBUMIN 4.2 05/12/2023   BILITOT 0.5 05/12/2023   ALKPHOS 102 05/12/2023   AST 21 05/12/2023   ALT 44 05/12/2023   ANIONGAP 10 01/05/2023       Lbbb (Left Bundle Branch Block)  Hyperlipidemia   Medications: not yet discussed Lab Results  Component Value Date   HDL 43.00 02/25/2023   HDL 54 10/20/2021   HDL 32 (L) 07/04/2019   CHOLHDL 4 02/25/2023   CHOLHDL 3.4 10/20/2021   CHOLHDL 5.9 07/04/2019   Lab Results  Component Value Date   LDLCALC 106 (H) 02/25/2023   LDLCALC 122 (H) 10/20/2021   LDLCALC 141 (H) 07/04/2019   LDLDIRECT 155.3 04/24/2010   Lab Results  Component Value Date   TRIG 98.0 02/25/2023   TRIG 34 10/20/2021   TRIG 80 07/04/2019   Lab Results  Component Value Date   CHOL 169 02/25/2023   CHOL 183 10/20/2021   CHOL 189 07/04/2019   The 10-year ASCVD risk score (Arnett DK, et al., 2019) is: 12.4%   Values used to calculate the score:     Age: 67 years     Sex: Male     Is Non-Hispanic African American: Yes     Diabetic: No     Tobacco smoker: No     Systolic Blood Pressure: 118 mmHg     Is BP treated: Yes     HDL Cholesterol: 43 mg/dL     Total Cholesterol: 169 mg/dL Lab Results  Component Value Date   ALT 44 05/12/2023   AST 21 05/12/2023   ALKPHOS 102 05/12/2023   TSH 2.358 01/28/2009   HGBA1C 6.3 02/25/2023   Body mass index is 34.58 kg/m.  Lipoprotein(a), Apolipoprotein B (ApoB), and High-sensitivity C-reactive protein (hs-CRP) No results found for: "HSCRP", "LIPOA" Improving Your Cholesterol: Diet: Focus on a Mediterranean-style diet, limit saturated fats and sugars, and increase omega-3 fatty acids (fish, flaxseeds,nuts,extra virgin olive oil, avocados). Exercise: Engage in regular physical activity (aerobic exercises are particularly beneficial for HDL). Weight Management: Maintain a healthy weight. Smoking Cessation: Quitting smoking improves cholesterol levels.     Hypertension    Patient reports compliance with current  medications and no significant side effect(s)   Current hypertension medications:       Sig   carvedilol (COREG) 12.5 MG tablet Take 1.5 tablets (18.75 mg total) by mouth 2 (two) times daily.   furosemide (LASIX) 20 MG tablet Take 1 tablet (20 mg total) by mouth daily.   sacubitril-valsartan (ENTRESTO) 97-103 MG Take 1 tablet by mouth 2 (two) times daily.   spironolactone (ALDACTONE) 25 MG tablet Take 1 tablet (25 mg total) by mouth daily.      BP Readings from Last 3 Encounters:  10/20/23 118/83  05/12/23 116/82  02/25/23 122/82         GERD   Didn't get Pepcid complete over price "Not that bad, have to watch what I eat" Only take medications as needed omeprazole      Med reconciliation: No current outpatient medications on file prior to visit.   No current facility-administered medications on file prior to visit.   Medications Discontinued During This Encounter  Medication Reason   aspirin EC 81 MG tablet Reorder   atorvastatin (LIPITOR) 20 MG tablet Reorder   furosemide (LASIX) 20 MG tablet Reorder   Cholecalciferol (VITAMIN D3) 125 MCG (5000  UT) capsule Reorder   carvedilol (COREG) 12.5 MG tablet Reorder   empagliflozin (JARDIANCE) 10 MG TABS tablet Reorder   famotidine-calcium carbonate-magnesium hydroxide (PEPCID COMPLETE) 10-800-165 MG chewable tablet Reorder   sacubitril-valsartan (ENTRESTO) 97-103 MG Reorder   spironolactone (ALDACTONE) 25 MG tablet Reorder   tamsulosin (FLOMAX) 0.4 MG CAPS capsule Reorder   omeprazole (PRILOSEC) 20 MG capsule Reorder   Cholecalciferol (VITAMIN D3) 125 MCG (5000 UT) CAPS Dose change     Objective   Physical Exam     10/20/2023    2:05 PM 05/12/2023   10:14 AM 02/25/2023   10:20 AM  Vitals with BMI  Height 5\' 8"  5\' 8"  5\' 8"   Weight 227 lbs 6 oz 235 lbs 235 lbs  BMI 34.58 35.74 35.74  Systolic 118 116 454  Diastolic 83 82 82  Pulse 95 81 76   Wt Readings from Last 10 Encounters:  10/20/23 227 lb 6.4 oz (103.1  kg)  05/12/23 235 lb (106.6 kg)  02/25/23 235 lb (106.6 kg)  02/25/23 235 lb (106.6 kg)  01/05/23 237 lb 12.8 oz (107.9 kg)  11/04/22 225 lb (102.1 kg)  10/16/22 227 lb (103 kg)  11/11/21 208 lb 6.4 oz (94.5 kg)  10/20/21 205 lb 9.6 oz (93.3 kg)  07/26/20 230 lb 3.2 oz (104.4 kg)   Vital signs reviewed.  Nursing notes reviewed. Weight trend reviewed. Abnormalities and Problem-Specific physical exam findings:  truncal adiposity no lower extremity edema   General Appearance:  No acute distress appreciable.   Well-groomed, healthy-appearing male.  Well proportioned with no abnormal fat distribution.  Good muscle tone. Pulmonary:  Normal work of breathing at rest, no respiratory distress apparent. SpO2: 96 %  Musculoskeletal: All extremities are intact.  Neurological:  Awake, alert, oriented, and engaged.  No obvious focal neurological deficits or cognitive impairments.  Sensorium seems unclouded.   Speech is clear and coherent with logical content. Psychiatric:  Appropriate mood, pleasant and cooperative demeanor, thoughtful and engaged during the exam  Results            No results found for any visits on 10/20/23.  Clinical Support on 09/03/2023  Component Date Value   Date Time Interrogation * 09/03/2023 [id number]    Pulse Generator Manufact* 09/03/2023 SJCR    Pulse Gen Model 09/03/2023 3357-40Q Unify Assura    Pulse Gen Serial Number 09/03/2023 [id number]    Clinic Name 09/03/2023 [facility]    Implantable Pulse Genera* 09/03/2023 Cardiac Resynch Therapy Defibulator    Implantable Pulse Genera* 09/03/2023 [id number]    Implantable Lead Manufac* 09/03/2023 OTHER    Implantable Lead Model 09/03/2023 [id number] Myopore    Implantable Lead Serial * 09/03/2023 [id number]    Implantable Lead Implant* 09/03/2023 [id number]    Implantable Lead Locatio* 09/03/2023 UNKNOWN    Implantable Lead Location 09/03/2023 [id number]    Implantable Lead Connect* 09/03/2023 259563    Implantable Lead  Manufac* 09/03/2023 MERM    Implantable Lead Model 09/03/2023 5076 CapSureFix Novus    Implantable Lead Serial * 09/03/2023 [id number]    Implantable Lead Implant* 09/03/2023 [id number]    Implantable Lead Locatio* 09/03/2023 UNKNOWN    Implantable Lead Location 09/03/2023 630160    Implantable Lead Connect* 09/03/2023 109323    Implantable Lead Manufac* 09/03/2023 MERM    Implantable Lead Model 09/03/2023 6935M Sprint Quattro Secure S    Implantable Lead Serial * 09/03/2023 [id number]    Implantable Lead Implant* 09/03/2023 [id number]    Implantable Lead Locatio* 09/03/2023 UNKNOWN  Implantable Lead Location 09/03/2023 161096    Implantable Lead Connect* 09/03/2023 [id number]    Lead Channel Setting Sen* 09/03/2023 0.5    Lead Channel Setting Sen* 09/03/2023 Adaptive Sensing    Lead Channel Setting Pac* 09/03/2023 2.0    Lead Channel Setting Pac* 09/03/2023 0.5    Lead Channel Setting Pac* 09/03/2023 2.0    Lead Channel Setting Pac* 09/03/2023 0.5    Lead Channel Setting Pac* 09/03/2023 2.0    Lead Channel Setting Pac* 09/03/2023 Fixed Pacing    Zone Setting Status 09/03/2023 Active    Zone Setting Status 09/03/2023 Inactive    Zone Setting Status 09/03/2023 Active    Lead Channel Status 09/03/2023 NULL    Lead Channel Impedance V* 09/03/2023 360    Lead Channel Pacing Thre* 09/03/2023 1.0    Lead Channel Pacing Thre* 09/03/2023 0.5    Lead Channel Status 09/03/2023 NULL    Lead Channel Impedance V* 09/03/2023 540    Lead Channel Sensing Int* 09/03/2023 4.0    Lead Channel Pacing Thre* 09/03/2023 0.5    Lead Channel Pacing Thre* 09/03/2023 0.5    Lead Channel Status 09/03/2023 NULL    Lead Channel Impedance V* 09/03/2023 540    Lead Channel Sensing Int* 09/03/2023 12.0    Lead Channel Pacing Thre* 09/03/2023 1.0    Lead Channel Pacing Thre* 09/03/2023 0.5    HighPow Impedance 09/03/2023 71    HighPow Impedance 09/03/2023 71    HighPow Imped Status 09/03/2023 NULL    HighPow Imped  Status 09/03/2023 NULL    Battery Status 09/03/2023 MOS    Battery Remaining Longev* 09/03/2023 71    Battery Remaining Percen* 09/03/2023 86.0    Battery Voltage 09/03/2023 3.04    Brady Statistic RA Perce* 09/03/2023 1.9    Brady Statistic AP VP Pe* 09/03/2023 2.0    Brady Statistic AS VP Pe* 09/03/2023 95.0    Brady Statistic AP VS Pe* 09/03/2023 1.0    Brady Statistic AS VS Pe* 09/03/2023 2.6   Clinical Support on 08/05/2023  Component Date Value   Date Time Interrogation * 08/06/2023 [id number]    Pulse Generator Manufact* 08/06/2023 SJCR    Pulse Gen Model 08/06/2023 3357-40Q Unify Assura    Pulse Gen Serial Number 08/06/2023 [id number]    Clinic Name 08/06/2023 [facility]    Implantable Pulse Genera* 08/06/2023 Cardiac Resynch Therapy Defibulator    Implantable Pulse Genera* 08/06/2023 [id number]    Implantable Lead Manufac* 08/06/2023 OTHER    Implantable Lead Model 08/06/2023 [id number] Myopore    Implantable Lead Serial * 08/06/2023 [id number]    Implantable Lead Implant* 08/06/2023 [id number]    Implantable Lead Locatio* 08/06/2023 UNKNOWN    Implantable Lead Location 08/06/2023 [id number]    Implantable Lead Connect* 08/06/2023 956387    Implantable Lead Manufac* 08/06/2023 MERM    Implantable Lead Model 08/06/2023 5076 CapSureFix Novus    Implantable Lead Serial * 08/06/2023 [id number]    Implantable Lead Implant* 08/06/2023 [id number]    Implantable Lead Locatio* 08/06/2023 UNKNOWN    Implantable Lead Location 08/06/2023 016010    Implantable Lead Connect* 08/06/2023 932355    Implantable Lead Manufac* 08/06/2023 MERM    Implantable Lead Model 08/06/2023 6935M Sprint Quattro Secure S    Implantable Lead Serial * 08/06/2023 [id number]    Implantable Lead Implant* 08/06/2023 [id number]    Implantable Lead Locatio* 08/06/2023 UNKNOWN    Implantable Lead Location 08/06/2023 831517    Implantable Lead Connect* 08/06/2023 616073    Lead Channel Setting  Sen* 08/06/2023 0.5    Lead  Channel Setting Sen* 08/06/2023 Adaptive Sensing    Lead Channel Setting Pac* 08/06/2023 2.0    Lead Channel Setting Pac* 08/06/2023 0.5    Lead Channel Setting Pac* 08/06/2023 2.0    Lead Channel Setting Pac* 08/06/2023 0.5    Lead Channel Setting Pac* 08/06/2023 2.0    Lead Channel Setting Pac* 08/06/2023 Fixed Pacing    Zone Setting Status 08/06/2023 Active    Zone Setting Status 08/06/2023 Inactive    Zone Setting Status 08/06/2023 Active    Lead Channel Status 08/06/2023 NULL    Lead Channel Impedance V* 08/06/2023 360    Lead Channel Pacing Thre* 08/06/2023 1.0    Lead Channel Pacing Thre* 08/06/2023 0.5    Lead Channel Status 08/06/2023 NULL    Lead Channel Impedance V* 08/06/2023 480    Lead Channel Sensing Int* 08/06/2023 4.0    Lead Channel Pacing Thre* 08/06/2023 0.5    Lead Channel Pacing Thre* 08/06/2023 0.5    Lead Channel Status 08/06/2023 NULL    Lead Channel Impedance V* 08/06/2023 510    Lead Channel Sensing Int* 08/06/2023 12.0    Lead Channel Pacing Thre* 08/06/2023 1.0    Lead Channel Pacing Thre* 08/06/2023 0.5    HighPow Impedance 08/06/2023 69    HighPow Impedance 08/06/2023 69    HighPow Imped Status 08/06/2023 NULL    HighPow Imped Status 08/06/2023 NULL    Battery Status 08/06/2023 MOS    Battery Remaining Longev* 08/06/2023 71    Battery Remaining Percen* 08/06/2023 86.0    Battery Voltage 08/06/2023 3.05    Brady Statistic RA Perce* 08/06/2023 1.9    Brady Statistic AP VP Pe* 08/06/2023 2.0    Brady Statistic AS VP Pe* 08/06/2023 95.0    Brady Statistic AP VS Pe* 08/06/2023 1.0    Brady Statistic AS VS Pe* 08/06/2023 2.7   Office Visit on 05/12/2023  Component Date Value   Color, Urine 05/12/2023 Lt. Yellow    APPearance 05/12/2023 CLEAR    Specific Gravity, Urine 05/12/2023 1.020    pH 05/12/2023 6.0    Total Protein, Urine 05/12/2023 NEGATIVE    Urine Glucose 05/12/2023 NEGATIVE    Ketones, ur 05/12/2023 NEGATIVE    Bilirubin Urine  05/12/2023 NEGATIVE    Hgb urine dipstick 05/12/2023 TRACE-LYSED (A)    Urobilinogen, UA 05/12/2023 0.2    Leukocytes,Ua 05/12/2023 NEGATIVE    Nitrite 05/12/2023 NEGATIVE    WBC, UA 05/12/2023 0-2/hpf    RBC / HPF 05/12/2023 3-6/hpf (A)    Mucus, UA 05/12/2023 Presence of (A)    Squamous Epithelial / HPF 05/12/2023 Rare(0-4/hpf)    WBC 05/12/2023 9.5    RBC 05/12/2023 5.69    Hemoglobin 05/12/2023 14.7    HCT 05/12/2023 46.6    MCV 05/12/2023 82.0    MCHC 05/12/2023 31.5    RDW 05/12/2023 15.7 (H)    Platelets 05/12/2023 289.0    Neutrophils Relative % 05/12/2023 66.7    Lymphocytes Relative 05/12/2023 22.7    Monocytes Relative 05/12/2023 7.7    Eosinophils Relative 05/12/2023 2.2    Basophils Relative 05/12/2023 0.7    Neutro Abs 05/12/2023 6.4    Lymphs Abs 05/12/2023 2.2    Monocytes Absolute 05/12/2023 0.7    Eosinophils Absolute 05/12/2023 0.2    Basophils Absolute 05/12/2023 0.1    Sodium 05/12/2023 138    Potassium 05/12/2023 4.7    Chloride 05/12/2023 105    CO2 05/12/2023 24  Glucose, Bld 05/12/2023 115 (H)    BUN 05/12/2023 16    Creatinine, Ser 05/12/2023 1.35    Total Bilirubin 05/12/2023 0.5    Alkaline Phosphatase 05/12/2023 102    AST 05/12/2023 21    ALT 05/12/2023 44    Total Protein 05/12/2023 7.2    Albumin 05/12/2023 4.2    GFR 05/12/2023 56.40 (L)    Calcium 05/12/2023 9.7    VITD 05/12/2023 18.65 (L)    Magnesium 05/12/2023 2.0    Uric Acid, Serum 05/12/2023 4.3   Clinical Support on 05/06/2023  Component Date Value   Date Time Interrogation * 05/07/2023 [id number]    Pulse Generator Manufact* 05/07/2023 SJCR    Pulse Gen Model 05/07/2023 3357-40Q Unify Assura    Pulse Gen Serial Number 05/07/2023 [id number]    Clinic Name 05/07/2023 [facility]    Implantable Pulse Genera* 05/07/2023 Cardiac Resynch Therapy Defibulator    Implantable Pulse Genera* 05/07/2023 [id number]    Implantable Lead Manufac* 05/07/2023 OTHER    Implantable Lead  Model 05/07/2023 657846 Myopore    Implantable Lead Serial * 05/07/2023 [id number]    Implantable Lead Implant* 05/07/2023 [id number]    Implantable Lead Locatio* 05/07/2023 UNKNOWN    Implantable Lead Location 05/07/2023 102725    Implantable Lead Connect* 05/07/2023 366440    Implantable Lead Manufac* 05/07/2023 MERM    Implantable Lead Model 05/07/2023 5076 CapSureFix Novus    Implantable Lead Serial * 05/07/2023 [id number]    Implantable Lead Implant* 05/07/2023 [id number]    Implantable Lead Locatio* 05/07/2023 UNKNOWN    Implantable Lead Location 05/07/2023 951884    Implantable Lead Connect* 05/07/2023 166063    Implantable Lead Manufac* 05/07/2023 MERM    Implantable Lead Model 05/07/2023 6935M Sprint Quattro Secure S    Implantable Lead Serial * 05/07/2023 [id number]    Implantable Lead Implant* 05/07/2023 [id number]    Implantable Lead Locatio* 05/07/2023 UNKNOWN    Implantable Lead Location 05/07/2023 254270    Implantable Lead Connect* 05/07/2023 623762    Lead Channel Setting Sen* 05/07/2023 0.5    Lead Channel Setting Sen* 05/07/2023 Adaptive Sensing    Lead Channel Setting Pac* 05/07/2023 2.0    Lead Channel Setting Pac* 05/07/2023 0.5    Lead Channel Setting Pac* 05/07/2023 2.0    Lead Channel Setting Pac* 05/07/2023 0.5    Lead Channel Setting Pac* 05/07/2023 2.0    Lead Channel Setting Pac* 05/07/2023 Fixed Pacing    Zone Setting Status 05/07/2023 Active    Zone Setting Status 05/07/2023 Inactive    Zone Setting Status 05/07/2023 Active    Lead Channel Status 05/07/2023 NULL    Lead Channel Impedance V* 05/07/2023 380    Lead Channel Pacing Thre* 05/07/2023 1.0    Lead Channel Pacing Thre* 05/07/2023 0.5    Lead Channel Status 05/07/2023 NULL    Lead Channel Impedance V* 05/07/2023 460    Lead Channel Sensing Int* 05/07/2023 4.5    Lead Channel Pacing Thre* 05/07/2023 0.5    Lead Channel Pacing Thre* 05/07/2023 0.5    Lead Channel Status 05/07/2023 NULL    Lead  Channel Impedance V* 05/07/2023 510    Lead Channel Sensing Int* 05/07/2023 12.0    Lead Channel Pacing Thre* 05/07/2023 1.0    Lead Channel Pacing Thre* 05/07/2023 0.5    HighPow Impedance 05/07/2023 68    HighPow Impedance 05/07/2023 68    HighPow Imped Status 05/07/2023 NULL    HighPow Imped Status 05/07/2023 NULL    Battery Status 05/07/2023  MOS    Battery Remaining Longev* 05/07/2023 74    Battery Remaining Percen* 05/07/2023 90.0    Battery Voltage 05/07/2023 3.11    Brady Statistic RA Perce* 05/07/2023 1.0    Brady Statistic AP VP Pe* 05/07/2023 1.0    Brady Statistic AS VP Pe* 05/07/2023 98.0    Brady Statistic AP VS Pe* 05/07/2023 1.0    Brady Statistic AS VS Pe* 05/07/2023 1.3   Office Visit on 02/25/2023  Component Date Value   WBC 02/25/2023 7.2    RBC 02/25/2023 5.62    Platelets 02/25/2023 324.0    Hemoglobin 02/25/2023 14.6    HCT 02/25/2023 45.7    MCV 02/25/2023 81.2    MCHC 02/25/2023 32.1    RDW 02/25/2023 14.7    Sodium 02/25/2023 140    Potassium 02/25/2023 4.7    Chloride 02/25/2023 106    CO2 02/25/2023 27    Glucose, Bld 02/25/2023 112 (H)    BUN 02/25/2023 14    Creatinine, Ser 02/25/2023 1.34    Total Bilirubin 02/25/2023 0.5    Alkaline Phosphatase 02/25/2023 98    AST 02/25/2023 24    ALT 02/25/2023 49    Total Protein 02/25/2023 7.2    Albumin 02/25/2023 4.4    GFR 02/25/2023 56.99 (L)    Calcium 02/25/2023 9.5    Cholesterol 02/25/2023 169    Triglycerides 02/25/2023 98.0    HDL 02/25/2023 43.00    VLDL 02/25/2023 19.6    LDL Cholesterol 02/25/2023 106 (H)    Total CHOL/HDL Ratio 02/25/2023 4    NonHDL 02/25/2023 125.99    Hepatitis C Ab 02/25/2023 NON-REACTIVE    HIV 1&2 Ab, 4th Generati* 02/25/2023 NON-REACTIVE    Pro B Natriuretic peptid* 02/25/2023 19.0    VITD 02/25/2023 13.93 (L)    Phosphorus 02/25/2023 3.3    Magnesium 02/25/2023 1.9    Uric Acid, Serum 02/25/2023 4.0    Microalb, Ur 02/25/2023 20.2 (H)    Creatinine,U  02/25/2023 143.0    Microalb Creat Ratio 02/25/2023 14.2    CYSTATIN C 02/25/2023 1.04    eGFR 02/25/2023 74    Hgb A1c MFr Bld 02/25/2023 6.3    Color, Urine 02/25/2023 YELLOW    APPearance 02/25/2023 Sl Cloudy (A)    Specific Gravity, Urine 02/25/2023 1.025    pH 02/25/2023 6.0    Total Protein, Urine 02/25/2023 30 (A)    Urine Glucose 02/25/2023 NEGATIVE    Ketones, ur 02/25/2023 NEGATIVE    Bilirubin Urine 02/25/2023 NEGATIVE    Hgb urine dipstick 02/25/2023 LARGE (A)    Urobilinogen, UA 02/25/2023 0.2    Leukocytes,Ua 02/25/2023 NEGATIVE    Nitrite 02/25/2023 NEGATIVE    WBC, UA 02/25/2023 0-2/hpf    RBC / HPF 02/25/2023 21-50/hpf (A)    Mucus, UA 02/25/2023 Presence of (A)    Squamous Epithelial / HPF 02/25/2023 Rare(0-4/hpf)   Office Visit on 02/25/2023  Component Date Value   Glucose 02/25/2023 118 (H)    BUN 02/25/2023 14    Creatinine, Ser 02/25/2023 1.25    eGFR 02/25/2023 66    BUN/Creatinine Ratio 02/25/2023 11    Sodium 02/25/2023 140    Potassium 02/25/2023 4.6    Chloride 02/25/2023 106    CO2 02/25/2023 19 (L)    Calcium 02/25/2023 9.0    Date Time Interrogation * 02/25/2023 [id number]    Pulse Generator Manufact* 02/25/2023 SJCR    Pulse Gen Model 02/25/2023 3357-40Q Unify Assura    Pulse Gen Serial Number  02/25/2023 [id number]    Clinic Name 02/25/2023 [facility]    Implantable Pulse Genera* 02/25/2023 Cardiac Resynch Therapy Defibulator    Implantable Pulse Genera* 02/25/2023 [id number]    Implantable Lead Manufac* 02/25/2023 OTHER    Implantable Lead Model 02/25/2023 914782 Myopore    Implantable Lead Serial * 02/25/2023 956213    Implantable Lead Implant* 02/25/2023 08657846    Implantable Lead Locatio* 02/25/2023 UNKNOWN    Implantable Lead Location 02/25/2023 962952    Implantable Lead Connect* 02/25/2023 841324    Implantable Lead Manufac* 02/25/2023 MERM    Implantable Lead Model 02/25/2023 5076 CapSureFix Novus    Implantable Lead  Serial * 02/25/2023 [id number]    Implantable Lead Implant* 02/25/2023 [id number]    Implantable Lead Locatio* 02/25/2023 UNKNOWN    Implantable Lead Location 02/25/2023 259563    Implantable Lead Connect* 02/25/2023 875643    Implantable Lead Manufac* 02/25/2023 MERM    Implantable Lead Model 02/25/2023 6935M Sprint Quattro Secure S    Implantable Lead Serial * 02/25/2023 [id number]    Implantable Lead Implant* 02/25/2023 [id number]    Implantable Lead Locatio* 02/25/2023 UNKNOWN    Implantable Lead Location 02/25/2023 010932    Implantable Lead Connect* 02/25/2023 355732    Lead Channel Setting Sen* 02/25/2023 0.5    Lead Channel Setting Pac* 02/25/2023 2.0    Lead Channel Setting Pac* 02/25/2023 0.5    Lead Channel Setting Pac* 02/25/2023 2.0    Lead Channel Setting Pac* 02/25/2023 0.5    Lead Channel Setting Pac* 02/25/2023 2.0    Lead Channel Setting Pac* 02/25/2023 Fixed Pacing    Zone Setting Status 02/25/2023 Active    Zone Setting Status 02/25/2023 Inactive    Zone Setting Status 02/25/2023 Active    Lead Channel Impedance V* 02/25/2023 450.0    Lead Channel Sensing Int* 02/25/2023 4.2    Lead Channel Pacing Thre* 02/25/2023 0.5    Lead Channel Pacing Thre* 02/25/2023 0.5    Lead Channel Pacing Thre* 02/25/2023 0.5    Lead Channel Pacing Thre* 02/25/2023 0.5    Lead Channel Impedance V* 02/25/2023 575.0    Lead Channel Sensing Int* 02/25/2023 12.0    Lead Channel Pacing Thre* 02/25/2023 1.0    Lead Channel Pacing Thre* 02/25/2023 0.5    Lead Channel Pacing Thre* 02/25/2023 1.0    Lead Channel Pacing Thre* 02/25/2023 0.5    HighPow Impedance 02/25/2023 65.25    Lead Channel Impedance V* 02/25/2023 362.5    Lead Channel Pacing Thre* 02/25/2023 1.0    Lead Channel Pacing Thre* 02/25/2023 0.5    Lead Channel Pacing Thre* 02/25/2023 1.0    Lead Channel Pacing Thre* 02/25/2023 0.5    Battery Remaining Longev* 02/25/2023 74    Brady Statistic RA Perce* 02/25/2023 0.61    Brady  Statistic RV Perce* 02/25/2023 98.0   Clinical Support on 02/04/2023  Component Date Value   Date Time Interrogation * 02/04/2023 [id number]    Pulse Generator Manufact* 02/04/2023 SJCR    Pulse Gen Model 02/04/2023 3357-40Q Unify Assura    Pulse Gen Serial Number 02/04/2023 [id number]    Clinic Name 02/04/2023 [facility]    Implantable Pulse Genera* 02/04/2023 Cardiac Resynch Therapy Defibulator    Implantable Pulse Genera* 02/04/2023 [id number]    Implantable Lead Manufac* 02/04/2023 OTHER    Implantable Lead Model 02/04/2023 626948 Myopore    Implantable Lead Serial * 02/04/2023 546270    Implantable Lead Implant* 02/04/2023 [id number]    Implantable Lead Locatio* 02/04/2023 UNKNOWN  Implantable Lead Location 02/04/2023 063016    Implantable Lead Connect* 02/04/2023 010932    Implantable Lead Manufac* 02/04/2023 MERM    Implantable Lead Model 02/04/2023 5076 CapSureFix Novus    Implantable Lead Serial * 02/04/2023 [id number]    Implantable Lead Implant* 02/04/2023 [id number]    Implantable Lead Locatio* 02/04/2023 UNKNOWN    Implantable Lead Location 02/04/2023 628315    Implantable Lead Connect* 02/04/2023 176160    Implantable Lead Manufac* 02/04/2023 MERM    Implantable Lead Model 02/04/2023 6935M Sprint Quattro Secure S    Implantable Lead Serial * 02/04/2023 [id number]    Implantable Lead Implant* 02/04/2023 [id number]    Implantable Lead Locatio* 02/04/2023 UNKNOWN    Implantable Lead Location 02/04/2023 350093    Implantable Lead Connect* 02/04/2023 818299    Lead Channel Setting Sen* 02/04/2023 0.5    Lead Channel Setting Sen* 02/04/2023 Adaptive Sensing    Lead Channel Setting Pac* 02/04/2023 2.0    Lead Channel Setting Pac* 02/04/2023 0.5    Lead Channel Setting Pac* 02/04/2023 2.0    Lead Channel Setting Pac* 02/04/2023 0.5    Lead Channel Setting Pac* 02/04/2023 2.0    Lead Channel Setting Pac* 02/04/2023 Fixed Pacing    Zone Setting Status 02/04/2023 Active     Zone Setting Status 02/04/2023 Inactive    Zone Setting Status 02/04/2023 Active    Lead Channel Status 02/04/2023 NULL    Lead Channel Impedance V* 02/04/2023 360    Lead Channel Pacing Thre* 02/04/2023 1.0    Lead Channel Pacing Thre* 02/04/2023 0.5    Lead Channel Status 02/04/2023 NULL    Lead Channel Impedance V* 02/04/2023 430    Lead Channel Sensing Int* 02/04/2023 3.6    Lead Channel Pacing Thre* 02/04/2023 0.5    Lead Channel Pacing Thre* 02/04/2023 0.5    Lead Channel Status 02/04/2023 NULL    Lead Channel Impedance V* 02/04/2023 560    Lead Channel Sensing Int* 02/04/2023 12.0    Lead Channel Pacing Thre* 02/04/2023 0.5    Lead Channel Pacing Thre* 02/04/2023 0.5    HighPow Impedance 02/04/2023 61    HighPow Impedance 02/04/2023 61    HighPow Imped Status 02/04/2023 NULL    HighPow Imped Status 02/04/2023 NULL    Battery Status 02/04/2023 MOS    Battery Remaining Longev* 02/04/2023 77    Battery Remaining Percen* 02/04/2023 93.0    Battery Voltage 02/04/2023 3.17    Brady Statistic RA Perce* 02/04/2023 1.0    Brady Statistic AP VP Pe* 02/04/2023 1.0    Brady Statistic AS VP Pe* 02/04/2023 97.0    Brady Statistic AP VS Pe* 02/04/2023 1.0    Brady Statistic AS VS Pe* 02/04/2023 2.3   Clinical Support on 01/22/2023  Component Date Value   Pulse Generator Manufact* 01/22/2023 SJCR    Date Time Interrogation * 01/22/2023 [id number]    Pulse Gen Model 01/22/2023 3357-40Q Unify Assura    Pulse Gen Serial Number 01/22/2023 [id number]    Clinic Name 01/22/2023 [facility]    Implantable Pulse Genera* 01/22/2023 Cardiac Resynch Therapy Defibulator    Implantable Pulse Genera* 01/22/2023 [id number]    Implantable Lead Manufac* 01/22/2023 OTHER    Implantable Lead Model 01/22/2023 353614 Myopore    Implantable Lead Serial * 01/22/2023 431540    Implantable Lead Implant* 01/22/2023 08676195    Implantable Lead Locatio* 01/22/2023 UNKNOWN    Implantable Lead Location  01/22/2023 093267    Implantable Lead Connect* 01/22/2023 124580    Implantable Lead  Manufac* 01/22/2023 MERM    Implantable Lead Model 01/22/2023 5076 CapSureFix Novus    Implantable Lead Serial * 01/22/2023 [id number]    Implantable Lead Implant* 01/22/2023 85462703    Implantable Lead Locatio* 01/22/2023 UNKNOWN    Implantable Lead Location 01/22/2023 500938    Implantable Lead Connect* 01/22/2023 182993    Implantable Lead Manufac* 01/22/2023 MERM    Implantable Lead Model 01/22/2023 6935M Sprint Quattro Secure S    Implantable Lead Serial * 01/22/2023 [id number]    Implantable Lead Implant* 01/22/2023 81017510    Implantable Lead Locatio* 01/22/2023 UNKNOWN    Implantable Lead Location 01/22/2023 258527    Implantable Lead Connect* 01/22/2023 782423   Hospital Outpatient Visit on 01/05/2023  Component Date Value   Sodium 01/05/2023 141    Potassium 01/05/2023 3.8    Chloride 01/05/2023 107    CO2 01/05/2023 24    Glucose, Bld 01/05/2023 132 (H)    BUN 01/05/2023 12    Creatinine, Ser 01/05/2023 1.25 (H)    Calcium 01/05/2023 8.8 (L)    GFR, Estimated 01/05/2023 >60    Anion gap 01/05/2023 10    B Natriuretic Peptide 01/05/2023 14.9   Clinical Support on 11/18/2022  Component Date Value   Date Time Interrogation * 11/18/2022 [id number]    Pulse Generator Manufact* 11/18/2022 SJCR    Pulse Gen Model 11/18/2022 3357-40Q Unify Assura    Pulse Gen Serial Number 11/18/2022 [id number]    Clinic Name 11/18/2022 [facility]    Implantable Pulse Genera* 11/18/2022 Cardiac Resynch Therapy Defibulator    Implantable Pulse Genera* 11/18/2022 [id number]    Implantable Lead Manufac* 11/18/2022 OTHER    Implantable Lead Model 11/18/2022 809983 Myopore    Implantable Lead Serial * 11/18/2022 382505    Implantable Lead Implant* 11/18/2022 [id number]    Implantable Lead Locatio* 11/18/2022 UNKNOWN    Implantable Lead Location 11/18/2022 937902    Implantable Lead Connect* 11/18/2022  409735    Implantable Lead Manufac* 11/18/2022 MERM    Implantable Lead Model 11/18/2022 5076 CapSureFix Novus    Implantable Lead Serial * 11/18/2022 [id number]    Implantable Lead Implant* 11/18/2022 [id number]    Implantable Lead Locatio* 11/18/2022 UNKNOWN    Implantable Lead Location 11/18/2022 798921    Implantable Lead Connect* 11/18/2022 194174    Implantable Lead Manufac* 11/18/2022 MERM    Implantable Lead Model 11/18/2022 6935M Sprint Quattro Secure S    Implantable Lead Serial * 11/18/2022 [id number]    Implantable Lead Implant* 11/18/2022 [id number]    Implantable Lead Locatio* 11/18/2022 UNKNOWN    Implantable Lead Location 11/18/2022 637858    Implantable Lead Connect* 11/18/2022 850277    Lead Channel Setting Sen* 11/18/2022 0.5    Lead Channel Setting Pac* 11/18/2022 2.0    Lead Channel Setting Pac* 11/18/2022 0.5    Lead Channel Setting Pac* 11/18/2022 2.0    Lead Channel Setting Pac* 11/18/2022 0.5    Lead Channel Setting Pac* 11/18/2022 2.0    Lead Channel Setting Pac* 11/18/2022 Fixed Pacing    Zone Setting Status 11/18/2022 Active    Zone Setting Status 11/18/2022 Inactive    Zone Setting Status 11/18/2022 Active    Lead Channel Impedance V* 11/18/2022 437.5    Lead Channel Sensing Int* 11/18/2022 4.6    Lead Channel Pacing Thre* 11/18/2022 0.5    Lead Channel Pacing Thre* 11/18/2022 0.5    Lead Channel Pacing Thre* 11/18/2022 0.5    Lead Channel Pacing Thre* 11/18/2022 0.5    Lead Channel Impedance V*  11/18/2022 512.5    Lead Channel Sensing Int* 11/18/2022 12.0    Lead Channel Pacing Thre* 11/18/2022 0.5    Lead Channel Pacing Thre* 11/18/2022 0.5    Lead Channel Pacing Thre* 11/18/2022 0.5    Lead Channel Pacing Thre* 11/18/2022 0.5    HighPow Impedance 11/18/2022 57.375    Lead Channel Impedance V* 11/18/2022 362.5    Lead Channel Pacing Thre* 11/18/2022 1.0    Lead Channel Pacing Thre* 11/18/2022 0.5    Lead Channel Pacing Thre* 11/18/2022 1.0    Lead  Channel Pacing Thre* 11/18/2022 0.5    Battery Remaining Longev* 11/18/2022 74    Brady Statistic RA Perce* 11/18/2022 0.09    Brady Statistic RV Perce* 11/18/2022 99.0    Eval Rhythm 11/18/2022 AS/VS 76   There may be more visits with results that are not included.   No image results found.   CUP PACEART REMOTE DEVICE CHECK  Result Date: 09/07/2023 Scheduled remote reviewed. Normal device function.  1 AMS, 6sec in duration Next remote 91 days. [place], CVRS  CUP PACEART REMOTE DEVICE CHECK  Result Date: 08/06/2023 Scheduled remote reviewed. Normal device function.  1 brief NSVT, no therapy 10 AMS, longest duration 20hrs , 6/29, atrial driven tachycardia, this has been seen in the past.  Next remote 91 days. [place], CVRS   No results found.     Additional Info: This encounter employed real-time, collaborative documentation. The patient actively reviewed and updated their medical record on a shared screen, ensuring transparency and facilitating joint problem-solving for the problem list, overview, and plan. This approach promotes accurate, informed care. The treatment plan was discussed and reviewed in detail, including medication safety, potential side effects, and all patient questions. We confirmed understanding and comfort with the plan. Follow-up instructions were established, including contacting the office for any concerns, returning if symptoms worsen, persist, or new symptoms develop, and precautions for potential emergency department visits.

## 2023-10-20 NOTE — Assessment & Plan Note (Signed)
Current office BP at goal Continue current medication regimen Recommended obtaining home BP monitor Will reassess need for medication adjustment once home readings available

## 2023-10-20 NOTE — Assessment & Plan Note (Signed)
Wasn't able to get the 5000 prescribed, will recheck and send 50 k weekly, reminded him its weekly dosing only.

## 2023-10-20 NOTE — Assessment & Plan Note (Addendum)
Symptoms well controlled with prn omeprazole Continue current regimen Avoid trigger foods 90-day prescription provided Put info in AVS to encouraged patient to taper off, prior attempt to shift to Pepcid complete he didn't get over price

## 2023-10-20 NOTE — Assessment & Plan Note (Signed)
Lab confirmed deficiency  Lab Results  Component Value Date   VD25OH 18.65 (L) 05/12/2023   VD25OH 13.93 (L) 02/25/2023    Starting Vitamin D 50,000 units weekly Emphasized proper dosing schedule Will recheck levels at follow-up

## 2023-10-21 LAB — CBC WITH DIFFERENTIAL/PLATELET
Basophils Absolute: 0.1 10*3/uL (ref 0.0–0.1)
Basophils Relative: 1 % (ref 0.0–3.0)
Eosinophils Absolute: 0.1 10*3/uL (ref 0.0–0.7)
Eosinophils Relative: 0.7 % (ref 0.0–5.0)
HCT: 48.2 % (ref 39.0–52.0)
Hemoglobin: 15.3 g/dL (ref 13.0–17.0)
Lymphocytes Relative: 27.5 % (ref 12.0–46.0)
Lymphs Abs: 2.9 10*3/uL (ref 0.7–4.0)
MCHC: 31.7 g/dL (ref 30.0–36.0)
MCV: 83.4 fL (ref 78.0–100.0)
Monocytes Absolute: 0.9 10*3/uL (ref 0.1–1.0)
Monocytes Relative: 8.5 % (ref 3.0–12.0)
Neutro Abs: 6.7 10*3/uL (ref 1.4–7.7)
Neutrophils Relative %: 62.3 % (ref 43.0–77.0)
Platelets: 309 10*3/uL (ref 150.0–400.0)
RBC: 5.77 Mil/uL (ref 4.22–5.81)
RDW: 16.2 % — ABNORMAL HIGH (ref 11.5–15.5)
WBC: 10.7 10*3/uL — ABNORMAL HIGH (ref 4.0–10.5)

## 2023-10-21 LAB — LIPID PANEL W/REFLEX DIRECT LDL
Cholesterol: 202 mg/dL — ABNORMAL HIGH (ref <200)
HDL: 44 mg/dL (ref >=40)
LDL Cholesterol (Calc): 136 mg/dL — ABNORMAL HIGH
Non-HDL Cholesterol (Calc): 158 mg/dL — ABNORMAL HIGH (ref <130)
Total CHOL/HDL Ratio: 4.6 (calc) (ref <5.0)
Triglycerides: 116 mg/dL (ref <150)

## 2023-10-21 LAB — COMPREHENSIVE METABOLIC PANEL
ALT: 26 U/L (ref 0–53)
AST: 16 U/L (ref 0–37)
Albumin: 4.4 g/dL (ref 3.5–5.2)
Alkaline Phosphatase: 86 U/L (ref 39–117)
BUN: 16 mg/dL (ref 6–23)
CO2: 22 meq/L (ref 19–32)
Calcium: 10.3 mg/dL (ref 8.4–10.5)
Chloride: 108 meq/L (ref 96–112)
Creatinine, Ser: 1.36 mg/dL (ref 0.40–1.50)
GFR: 55.73 mL/min — ABNORMAL LOW (ref >60.00)
Glucose, Bld: 105 mg/dL — ABNORMAL HIGH (ref 70–99)
Potassium: 4.3 meq/L (ref 3.5–5.1)
Sodium: 139 meq/L (ref 135–145)
Total Bilirubin: 0.4 mg/dL (ref 0.2–1.2)
Total Protein: 7.2 g/dL (ref 6.0–8.3)

## 2023-10-21 LAB — URINALYSIS, ROUTINE W REFLEX MICROSCOPIC
Bilirubin Urine: NEGATIVE
Hgb urine dipstick: NEGATIVE
Ketones, ur: NEGATIVE
Leukocytes,Ua: NEGATIVE
Nitrite: NEGATIVE
RBC / HPF: NONE SEEN (ref 0–?)
Specific Gravity, Urine: >=1.03 — AB (ref 1.000–1.030)
Total Protein, Urine: NEGATIVE
Urine Glucose: >=1000 — AB
Urobilinogen, UA: 0.2 (ref 0.0–1.0)
pH: 6 (ref 5.0–8.0)

## 2023-10-21 LAB — URIC ACID: Uric Acid, Serum: 3.7 mg/dL — ABNORMAL LOW (ref 4.0–7.8)

## 2023-10-21 LAB — PROTEIN / CREATININE RATIO, URINE
Creatinine, Urine: 131 mg/dL (ref 20–320)
Protein/Creat Ratio: 229 mg/g{creat} — ABNORMAL HIGH (ref 25–148)
Protein/Creatinine Ratio: 0.229 mg/mg{creat} — ABNORMAL HIGH (ref 0.025–0.148)
Total Protein, Urine: 30 mg/dL — ABNORMAL HIGH (ref 5–25)

## 2023-10-21 LAB — MAGNESIUM: Magnesium: 2 mg/dL (ref 1.5–2.5)

## 2023-10-21 LAB — HEMOGLOBIN A1C: Hgb A1c MFr Bld: 6.4 % (ref 4.6–6.5)

## 2023-10-21 LAB — MICROALBUMIN / CREATININE URINE RATIO
Creatinine,U: 131.5 mg/dL
Microalb Creat Ratio: 3.1 mg/g (ref 0.0–30.0)
Microalb, Ur: 4.1 mg/dL — ABNORMAL HIGH (ref 0.0–1.9)

## 2023-10-21 LAB — VITAMIN D 25 HYDROXY (VIT D DEFICIENCY, FRACTURES): VITD: 15.7 ng/mL — ABNORMAL LOW (ref 30.00–100.00)

## 2023-10-22 ENCOUNTER — Telehealth: Payer: Self-pay | Admitting: Internal Medicine

## 2023-10-22 NOTE — Telephone Encounter (Signed)
Patient states he was called by [facility] to get scheduled for MRI but they stated since he has a defibrillator, this needs to be completed at the hospital. Please Advise.

## 2023-10-22 NOTE — Telephone Encounter (Signed)
Changed the imaging location to [facility] (per patient's request).

## 2023-10-22 NOTE — Addendum Note (Signed)
Addended by: Donzetta Starch on: 10/22/2023 02:46 PM   Modules accepted: Orders

## 2023-10-24 ENCOUNTER — Encounter: Payer: Self-pay | Admitting: Internal Medicine

## 2023-10-24 NOTE — Progress Notes (Signed)
Reviewed labs from 10/20/2023. Notable findings include declining GFR (55.73), elevated protein/creatinine ratio (229), severe vitamin D deficiency (15.70), and borderline A1c (6.4%). Given these results along with new headaches, recommending follow-up in 6-8 weeks rather than February 2025. Ordered MRI head, sleep study, and neurology referral if not already done. Starting high-dose vitamin D replacement. Detailed explanation sent via Patient Message.  #UrgentFollowUp  1. Schedule follow-up appointment in 6-8 weeks (do not wait until February 2025) 2. MRI scheduled 12/10/23 3. Submitted referral to neurology for headache evaluation 4. Submitted referral for sleep clinic return 5. Verify patient understanding of new vitamin D dosing (50,000 IU weekly)  Patient has MyChart - please send test results via MyChart message. If no MyChart view within 72 hours: - Call patient to review results - Mail printed copy if unable to reach by phone - Document all attempts at contact  [place] task complete once all orders placed and follow-up scheduled.

## 2023-11-03 ENCOUNTER — Encounter: Payer: Self-pay | Admitting: Neurology

## 2023-11-04 ENCOUNTER — Ambulatory Visit (INDEPENDENT_AMBULATORY_CARE_PROVIDER_SITE_OTHER): Payer: MEDICAID

## 2023-11-04 DIAGNOSIS — I428 Other cardiomyopathies: Secondary | ICD-10-CM

## 2023-12-03 ENCOUNTER — Ambulatory Visit (INDEPENDENT_AMBULATORY_CARE_PROVIDER_SITE_OTHER): Payer: MEDICAID

## 2023-12-03 DIAGNOSIS — I428 Other cardiomyopathies: Secondary | ICD-10-CM

## 2023-12-03 LAB — CUP PACEART REMOTE DEVICE CHECK
Battery Remaining Longevity: 70 mo
Battery Remaining Percentage: 82 %
Battery Voltage: 3.02 V
Brady Statistic AP VP Percent: 1.6 %
Brady Statistic AP VS Percent: 1 %
Brady Statistic AS VP Percent: 95 %
Brady Statistic AS VS Percent: 3.3 %
Brady Statistic RA Percent Paced: 1.5 %
Date Time Interrogation Session: 20250103063419
HighPow Impedance: 75 Ohm
HighPow Impedance: 75 Ohm
Implantable Lead Connection Status: 753985
Implantable Lead Connection Status: 753985
Implantable Lead Connection Status: 753985
Implantable Lead Implant Date: 20160115
Implantable Lead Implant Date: 20160115
Implantable Lead Implant Date: 20160310
Implantable Lead Location: 753858
Implantable Lead Location: 753859
Implantable Lead Location: 753860
Implantable Lead Model: 5076
Implantable Lead Model: 511212
Implantable Lead Serial Number: 247936
Implantable Pulse Generator Implant Date: 20231206
Lead Channel Impedance Value: 390 Ohm
Lead Channel Impedance Value: 540 Ohm
Lead Channel Impedance Value: 610 Ohm
Lead Channel Pacing Threshold Amplitude: 0.5 V
Lead Channel Pacing Threshold Amplitude: 1 V
Lead Channel Pacing Threshold Amplitude: 1 V
Lead Channel Pacing Threshold Pulse Width: 0.5 ms
Lead Channel Pacing Threshold Pulse Width: 0.5 ms
Lead Channel Pacing Threshold Pulse Width: 0.5 ms
Lead Channel Sensing Intrinsic Amplitude: 12 mV
Lead Channel Sensing Intrinsic Amplitude: 3.2 mV
Lead Channel Setting Pacing Amplitude: 2 V
Lead Channel Setting Pacing Amplitude: 2 V
Lead Channel Setting Pacing Amplitude: 2 V
Lead Channel Setting Pacing Pulse Width: 0.5 ms
Lead Channel Setting Pacing Pulse Width: 0.5 ms
Lead Channel Setting Sensing Sensitivity: 0.5 mV
Pulse Gen Serial Number: 5550090

## 2023-12-03 NOTE — Progress Notes (Signed)
 Initial neurology clinic note  Roger Crosby MRN: [id number] DOB: 11/07/1961  Referring provider: Anthon Kins, MD  Primary care provider: Anthon Kins, MD  Reason for consult:  headache  Subjective:  This is [name], a 63 y.o. right-handed male with a medical history of OSA (not on CPAP), CHF s/p PPM, pre-DM, HTN, HLD, GERD, vit D deficiency, CHF, CKD who presents to neurology clinic with headaches. The patient is alone today.  Patient has been getting headaches for about 6 months. The head hurts in the front, along the hair line on both sides. It is a dull, "hangover like" headache. It appears random to the patient. He does not think the headaches occur at the same time, such as at waking up, but he will get them during the night and wake him up. He gets a headache around 2 times a day. If he takes nothing, it will last about an hour. If he takes tylenol , it will go away in 20 minutes. He takes tylenol  on average once per day. He states the headaches aren't too bad, but about 5/10 on average. He endorses phonophobia, occasional nausea and lightheaded/dizziness associated with the headaches. When he gets a headache, he will relax. Most of time this will ease the headache. He denies laying or standing making the headache worse. He does endorse neck pain that he feels is associated with the headache.  Prior to about 6 months, he would have maybe 2 headaches per months. These headaches would be on the top of the head and associated with stress. They lasted about an hour and respond well to tylenol .  Sometimes it may also have been associated with high blood pressure or eating pork.  PCP ordered an MRI brain, but this could not be done due to pacemaker.  Patient has OSA but does not use CPAP. He was told there was conflicting data about whether he had OSA or not. He is to be scheduling another sleep study, but does not have a sleep medicine specialist. He endorses poor sleep. He  thinks he only sleeps an hour at a time. He has to go to the bathroom often. He thinks he gets 4-5 hours of sleep per night. He does not feel well rested during the day. He nods off napping throughout the day.  In terms of mood, he denies any significant mood abnormalities.  Caffeine use: 3-4 sodas per week EtOH use: once per week, 1 pint of liquor Drug use: None Smoker: Very rare Family history of headaches or neurologic disease: No  MEDICATIONS:  Outpatient Encounter Medications as of 12/15/2023  Medication Sig   aspirin  EC 81 MG tablet Take 1 tablet (81 mg total) by mouth daily.   atorvastatin  (LIPITOR) 20 MG tablet Take 1 tablet every day by oral route at bedtime for 90 days.   carvedilol  (COREG ) 12.5 MG tablet Take 1.5 tablets (18.75 mg total) by mouth 2 (two) times daily.   empagliflozin  (JARDIANCE ) 10 MG TABS tablet Take 1 tablet (10 mg total) by mouth daily before breakfast.   famotidine -calcium  carbonate-magnesium hydroxide (PEPCID  COMPLETE) 10-800-165 MG chewable tablet Chew 1 tablet by mouth daily as needed.   furosemide  (LASIX ) 20 MG tablet Take 1 tablet (20 mg total) by mouth daily. (Patient taking differently: Take 20 mg by mouth as needed.)   omeprazole  (PRILOSEC) 20 MG capsule Take 1 capsule (20 mg total) by mouth daily.   sacubitril -valsartan  (ENTRESTO ) 97-103 MG Take 1 tablet by mouth 2 (two) times  daily.   spironolactone  (ALDACTONE ) 25 MG tablet Take 1 tablet (25 mg total) by mouth daily.   tamsulosin  (FLOMAX ) 0.4 MG CAPS capsule Take 1 capsule (0.4 mg total) by mouth in the morning.   Vitamin D , Ergocalciferol , (DRISDOL ) 1.25 MG (50000 UNIT) CAPS capsule Take 1 capsule (50,000 Units total) by mouth every 7 (seven) days.   No facility-administered encounter medications on file as of 12/15/2023.    PAST MEDICAL HISTORY: Past Medical History:  Diagnosis Date   AICD (automatic cardioverter/defibrillator) present    a.  Unsuccessful LV lead placement 11/2014   Atypical  chest pain 04/07/2016   CAD (coronary artery disease)    a. nonobstructive by [facility] 6/11: oOM1 40%, mild luminal irregs elsewhere b. cath 09/2014L no obstructive disease c. 03/2016: Low-risk NST   Chest pain 03/11/2018   CHF (congestive heart failure) (HCC)    CKD (chronic kidney disease)    Cocaine use 07/04/2019   Dribbling of urine 12/18/2022   DYSPNEA 04/04/2010   Qualifier: Diagnosis of   By: Earle Glatter, Scott       Elevated red blood cell count 12/21/2022   Lab Results  Component  Value  Date/Time     HGB  14.9  10/16/2022 10:06 AM     HGB  15.9  10/20/2021 12:08 PM     HGB  14.6  07/06/2019 02:20 AM     HGB  14.9  07/05/2019 06:57 AM     HGB  15.1  07/04/2019 04:15 AM     HGB  15.3  03/11/2018 02:00 AM         Elevated troponin 07/04/2019   GERD (gastroesophageal reflux disease)    High cholesterol    History of drug abuse (HCC) 12/18/2022   cocaine   HTN (hypertension)    Hx of echocardiogram    echo 5/11: EF 45-50%, mild HK of Ap AS wall, ap Ant wall and true apex (LAD distribution), mild LAE   LBBB (left bundle branch block)    LBP (low back pain)    NICM (nonischemic cardiomyopathy) (HCC)    Normocytic hypochromic anemia 12/21/2022   S/P ICD (internal cardiac defibrillator) procedure 02/07/2015   Substance abuse (HCC) 04/08/2016   Systolic heart failure (HCC)    Tobacco dependence 07/26/2020    PAST SURGICAL HISTORY: Past Surgical History:  Procedure Laterality Date   BI-VENTRICULAR IMPLANTABLE CARDIOVERTER DEFIBRILLATOR N/A 12/14/2014   with failed LV lead placeemnt   BIV ICD GENERATOR CHANGEOUT N/A 11/04/2022   Procedure: BIV ICD GENERATOR CHANGEOUT;  Surgeon: Verona Goodwill, MD;  Location: [facility] INVASIVE CV LAB;  Service: Cardiovascular;  Laterality: N/A;   CARDIAC CATHETERIZATION Left 07/31/2013   with angiogram   DEBRIDEMENT AND CLOSURE WOUND Right 09/20/2015   Procedure: DEBRIDEMENT AND CLOSURE WOUND;  Surgeon: Mauricia South, MD;  Location: [facility] OR;  Service:  Plastics;  Laterality: Right;   EPICARDIAL PACING LEAD PLACEMENT N/A 02/07/2015   eicardial LV lead placed by Dr Jurline Olmsted   LEFT HEART CATH AND CORONARY ANGIOGRAPHY N/A 07/05/2019   Procedure: LEFT HEART CATH AND CORONARY ANGIOGRAPHY;  Surgeon: Darlis Eisenmenger, MD;  Location: [facility] INVASIVE CV LAB;  Service: Cardiovascular;  Laterality: N/A;   LEFT HEART CATHETERIZATION WITH CORONARY ANGIOGRAM N/A 08/04/2013   Procedure: LEFT HEART CATHETERIZATION WITH CORONARY ANGIOGRAM;  Surgeon: Darlis Eisenmenger, MD;  Location: [facility] CATH LAB;  Service: Cardiovascular;  Laterality: N/A;   PROSTATE SURGERY  02/15/2023   turbt and cystoscopy Dr. Ruby Corporal  TENDON REPAIR Left 09/20/2015   Procedure: repair nerve tendon wrist;  Surgeon: Mauricia South, MD;  Location: [facility] OR;  Service: Plastics;  Laterality: Left;   THORACOTOMY Left 02/07/2015   Procedure: THORACOTOMY MAJOR;  Surgeon: Bartley Lightning, MD;  Location: [facility] OR;  Service: Thoracic;  Laterality: Left;    ALLERGIES: No Known Allergies  FAMILY HISTORY: Family History  Problem Relation Age of Onset   Heart attack Mother        in 50's   Heart disease Mother        ischemic   Congestive Heart Failure Mother    Diabetes Mother    Hypertension Mother    Heart attack Father        in 47's   Heart disease Father        ischemic   Heart Problems Father    Crohn's disease Sister    Prostate cancer Brother    HIV/AIDS Brother     SOCIAL HISTORY: Social History   Tobacco Use   Smoking status: Former    Current packs/day: 0.12    Average packs/day: 0.1 packs/day for 25.0 years (3.0 ttl pk-yrs)    Types: Cigarettes   Smokeless tobacco: Never   Tobacco comments:    rarely  Vaping Use   Vaping status: Never Used  Substance Use Topics   Alcohol use: Yes    Comment: occasionally   Drug use: Not Currently    Types: "Crack" cocaine    Comment: clean x 6 mths   Social History   Social History Narrative   Are you right handed or left handed?  Right   Are you currently employed ?    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   Who lives with you? roommate   What type of home do you live in: 1 story or 2 story? two    Caffeine 1 soda a dat    Objective:  Vital Signs:  BP 103/75 (Cuff Size: Large)   Pulse 86   Ht 5\' 8"  (1.727 m)   Wt 226 lb (102.5 kg)   SpO2 98%   BMI 34.36 kg/m   General: No acute distress.  Patient appears well-groomed.   Head:  Normocephalic/atraumatic Eyes:  fundi examined, lateral disc margins seen well with clear borders, medial borders not well visualized. No obvious papilledema. Neck: supple, positive for paraspinal tenderness, reduced range of motion particularly when turning head to right Heart: regular rate and rhythm Lungs: Clear to auscultation bilaterally. Vascular: No carotid bruits.  Neurological Exam: Mental status: alert and oriented, speech fluent and not dysarthric, language intact.  Cranial nerves: CN I: not tested CN II: pupils equal, round and reactive to light, visual fields intact CN III, IV, VI:  full range of motion, no nystagmus, no ptosis CN V: facial sensation intact. CN VII: upper and lower face symmetric CN VIII: hearing intact CN IX, X: uvula midline CN XI: sternocleidomastoid and trapezius muscles intact CN XII: tongue midline  Bulk & Tone: normal, no fasciculations. Motor:  muscle strength 5/5 throughout Deep Tendon Reflexes:  2+ throughout.   Sensation:  Light touch sensation intact. Finger to nose testing:  Without dysmetria.    Gait:  Normal station and stride.  Romberg negative.   Labs and Imaging review: Internal labs: 10/20/23: HbA1c: 6.4 CBC w/ diff unremarkable CMET significant for glucose 105, Cr 1.36 Lipid panel: tChol 202, LDL 136, TG 116  Vit D (05/12/23): 18.65  External labs: TSH (04/21/23): 2.10  Imaging: Lumbar spine xray (08/21/14): FINDINGS:  Five views of the lumbar spine submitted. No acute  fracture or  subluxation. Moderate disc space flattening with anterior spurring  at L4-L5 and L5-S1 level. Anterior spurring noted upper endplate of  L3 vertebral body. Facet degenerative changes L4 and L5 level.   IMPRESSION:  No acute fracture or subluxation. Degenerative changes as described  above.   Assessment/Plan:  Airon Shifflett is a 63 y.o. male who presents for evaluation of headaches. He has a relevant medical history of OSA (not on CPAP), CHF s/p PPM, pre-DM, HTN, HLD, GERD, vit D deficiency, CHF, CKD. His neurological examination is pertinent for neck tightness and reduced range of motion of the neck, but otherwise normal. The etiology of patient's headaches is likely multifactorial with contributions from cervicalgia and untreated OSA. I will get blood work and a CT head, as head imaging is indicated in new onset headaches in this age group. Patient cannot get MRI due to pacemaker. I will also refer to sleep medicine and physical therapy. Patient would like to avoid more medication, but if headaches do not improve or worsen, we discussed he may need a preventative medication to avoid medication overuse by tylenol .  PLAN: -Blood work: TSH, B12 -CT head wo contrast (given that he cannot have MRI brain due to pacemaker) -Continue vit D supplementation -PT for neck pain -Sleep medicine referral  -Return to clinic 3-4 months  The impression above as well as the plan as outlined below were extensively discussed with the patient who voiced understanding. All questions were answered to their satisfaction.  When available, results of the above investigations and possible further recommendations will be communicated to the patient via telephone/MyChart. Patient to call office if not contacted after expected testing turnaround time.   Total time spent reviewing records, interview, history/exam, documentation, and coordination of care on day of encounter:  45 min   Thank you for allowing  me to participate in patient's care.  If I can answer any additional questions, I would be pleased to do so.  Rommie Coats, MD   CC: Anthon Kins, MD [address] [place] [address]  CC: Referring provider: Anthon Kins, MD [address] [place],  [place] [address]

## 2023-12-15 ENCOUNTER — Other Ambulatory Visit: Payer: MEDICAID

## 2023-12-15 ENCOUNTER — Encounter: Payer: Self-pay | Admitting: Neurology

## 2023-12-15 ENCOUNTER — Ambulatory Visit (INDEPENDENT_AMBULATORY_CARE_PROVIDER_SITE_OTHER): Payer: MEDICAID | Admitting: Neurology

## 2023-12-15 VITALS — BP 103/75 | HR 86 | Ht 68.0 in | Wt 226.0 lb

## 2023-12-15 DIAGNOSIS — M542 Cervicalgia: Secondary | ICD-10-CM

## 2023-12-15 DIAGNOSIS — Z72820 Sleep deprivation: Secondary | ICD-10-CM

## 2023-12-15 DIAGNOSIS — E559 Vitamin D deficiency, unspecified: Secondary | ICD-10-CM

## 2023-12-15 DIAGNOSIS — R519 Headache, unspecified: Secondary | ICD-10-CM | POA: Diagnosis not present

## 2023-12-15 DIAGNOSIS — G4733 Obstructive sleep apnea (adult) (pediatric): Secondary | ICD-10-CM | POA: Diagnosis not present

## 2023-12-15 DIAGNOSIS — G8929 Other chronic pain: Secondary | ICD-10-CM

## 2023-12-15 NOTE — Patient Instructions (Addendum)
 I saw you today for headaches. I think your headaches are a combination of your neck pain and the untreated sleep problems (probably sleep apnea).  To investigate further: -Blood work today -CT scan of your brain  I will be in touch when I have your results.  To help with your symptoms: -Referral to physical therapy to help with your neck pain/tightness -Referral to a sleep medicine specialist who can help with your sleep problems and help with CPAP if needed  I will see you back in clinic in about 3-4 months. Please let me know if you have any questions or concerns in the meantime.   The physicians and staff at [facility] are committed to providing excellent care. You may receive a survey requesting feedback about your experience at our office. We strive to receive "very good" responses to the survey questions. If you feel that your experience would prevent you from giving the office a "very good " response, please contact our office to try to remedy the situation. We may be reached at [phone]. Thank you for taking the time out of your busy day to complete the survey.  Rommie Coats, MD [facility]  More headache information: Be aware of common food triggers:  - Caffeine:  coffee, black tea, cola, Mt. Dew  - Chocolate  - Dairy:  aged cheeses (brie, blue, cheddar, gouda, Parmasan, provolone, romano, Swiss, etc), chocolate milk, buttermilk, sour cream, limit eggs and yogurt  - Nuts, peanut butter  - Alcohol  - Cereals/grains:  FRESH breads (fresh bagels, sourdough, doughnuts), yeast productions  - Processed/canned/aged/cured meats (pre-packaged deli meats, hotdogs)  - MSG/glutamate:  soy sauce, flavor enhancer, pickled/preserved/marinated foods  - Sweeteners:  aspartame (Equal, Nutrasweet).  Sugar and Splenda are okay  - Vegetables:  legumes (lima beans, lentils, snow peas, fava beans, pinto peans, peas, garbanzo beans), sauerkraut, onions, olives, pickles  - Fruit:   avocados, bananas, citrus fruit (orange, lemon, grapefruit), mango  - Other:  Frozen meals, macaroni and cheese Routine exercise Stay adequately hydrated (aim for 64 oz water  daily) Keep headache diary Maintain proper stress management Maintain proper sleep hygiene Do not skip meals Consider supplements:  magnesium citrate 400mg  daily, riboflavin 400mg  daily, coenzyme Q10 100mg  three times daily.

## 2023-12-15 NOTE — Addendum Note (Signed)
 Addended by: Arturo Bing on: 12/15/2023 11:42 AM   Modules accepted: Orders

## 2023-12-16 ENCOUNTER — Telehealth: Payer: Self-pay

## 2023-12-16 ENCOUNTER — Other Ambulatory Visit: Payer: Self-pay

## 2023-12-16 DIAGNOSIS — G4733 Obstructive sleep apnea (adult) (pediatric): Secondary | ICD-10-CM

## 2023-12-16 LAB — VITAMIN B12: Vitamin B-12: 273 pg/mL (ref 200–1100)

## 2023-12-16 LAB — TSH: TSH: 2.41 m[IU]/L (ref 0.40–4.50)

## 2023-12-16 NOTE — Telephone Encounter (Signed)
Copied from CRM [id number]. Topic: General - Other >> Dec 16, 2023 12:51 PM Truddie Crumble wrote: Reason for CRM: Pt called stating he would like to be called concerning a sleep study   Called patient back and informed them that I have placed a referral for a sleep study.

## 2024-01-06 ENCOUNTER — Other Ambulatory Visit: Payer: MEDICAID

## 2024-01-07 ENCOUNTER — Encounter: Payer: Self-pay | Admitting: Neurology

## 2024-01-11 NOTE — Progress Notes (Signed)
Remote ICD transmission.

## 2024-01-12 ENCOUNTER — Other Ambulatory Visit: Payer: MEDICAID

## 2024-01-13 ENCOUNTER — Other Ambulatory Visit: Payer: Self-pay

## 2024-01-13 ENCOUNTER — Ambulatory Visit: Payer: MEDICAID | Attending: Neurology

## 2024-01-13 DIAGNOSIS — M436 Torticollis: Secondary | ICD-10-CM | POA: Diagnosis present

## 2024-01-13 DIAGNOSIS — R29898 Other symptoms and signs involving the musculoskeletal system: Secondary | ICD-10-CM | POA: Insufficient documentation

## 2024-01-13 DIAGNOSIS — R519 Headache, unspecified: Secondary | ICD-10-CM | POA: Insufficient documentation

## 2024-01-13 DIAGNOSIS — M542 Cervicalgia: Secondary | ICD-10-CM | POA: Insufficient documentation

## 2024-01-13 DIAGNOSIS — G8929 Other chronic pain: Secondary | ICD-10-CM | POA: Diagnosis not present

## 2024-01-13 NOTE — Therapy (Signed)
OUTPATIENT PHYSICAL THERAPY CERVICAL EVALUATION   Patient Name: Roger Crosby MRN: [id number] DOB:28-Aug-1961, 63 y.o., male Today's Date: 01/13/2024  END OF SESSION:  PT End of Session - 01/13/24 1041     Visit Number 1    Date for PT Re-Evaluation 03/09/24    Progress Note Due on Visit 10    PT Start Time 0952    PT Stop Time 1038    PT Time Calculation (min) 46 min    Activity Tolerance Patient tolerated treatment well    Behavior During Therapy [facility] for tasks assessed/performed             Past Medical History:  Diagnosis Date   AICD (automatic cardioverter/defibrillator) present    a.  Unsuccessful LV lead placement 11/2014   Atypical chest pain 04/07/2016   CAD (coronary artery disease)    a. nonobstructive by [facility] 6/11: oOM1 40%, mild luminal irregs elsewhere b. cath 09/2014L no obstructive disease c. 03/2016: Low-risk NST   Chest pain 03/11/2018   CHF (congestive heart failure) (HCC)    CKD (chronic kidney disease)    Cocaine use 07/04/2019   Dribbling of urine 12/18/2022   DYSPNEA 04/04/2010   Qualifier: Diagnosis of   By: Huntley Dec, Scott       Elevated red blood cell count 12/21/2022   Lab Results  Component  Value  Date/Time     HGB  14.9  10/16/2022 10:06 AM     HGB  15.9  10/20/2021 12:08 PM     HGB  14.6  07/06/2019 02:20 AM     HGB  14.9  07/05/2019 06:57 AM     HGB  15.1  07/04/2019 04:15 AM     HGB  15.3  03/11/2018 02:00 AM         Elevated troponin 07/04/2019   GERD (gastroesophageal reflux disease)    High cholesterol    History of drug abuse (HCC) 12/18/2022   cocaine   HTN (hypertension)    Hx of echocardiogram    echo 5/11: EF 45-50%, mild HK of Ap AS wall, ap Ant wall and true apex (LAD distribution), mild LAE   LBBB (left bundle branch block)    LBP (low back pain)    NICM (nonischemic cardiomyopathy) (HCC)    Normocytic hypochromic anemia 12/21/2022   S/P ICD (internal cardiac defibrillator) procedure 02/07/2015   Substance abuse (HCC)  04/08/2016   Systolic heart failure (HCC)    Tobacco dependence 07/26/2020   Past Surgical History:  Procedure Laterality Date   BI-VENTRICULAR IMPLANTABLE CARDIOVERTER DEFIBRILLATOR N/A 12/14/2014   with failed LV lead placeemnt   BIV ICD GENERATOR CHANGEOUT N/A 11/04/2022   Procedure: BIV ICD GENERATOR CHANGEOUT;  Surgeon: Duke Salvia, MD;  Location: [facility] INVASIVE CV LAB;  Service: Cardiovascular;  Laterality: N/A;   CARDIAC CATHETERIZATION Left 07/31/2013   with angiogram   DEBRIDEMENT AND CLOSURE WOUND Right 09/20/2015   Procedure: DEBRIDEMENT AND CLOSURE WOUND;  Surgeon: Knute Neu, MD;  Location: [facility] OR;  Service: Plastics;  Laterality: Right;   EPICARDIAL PACING LEAD PLACEMENT N/A 02/07/2015   eicardial LV lead placed by Dr Renato Battles   LEFT HEART CATH AND CORONARY ANGIOGRAPHY N/A 07/05/2019   Procedure: LEFT HEART CATH AND CORONARY ANGIOGRAPHY;  Surgeon: Laurey Morale, MD;  Location: [facility] INVASIVE CV LAB;  Service: Cardiovascular;  Laterality: N/A;   LEFT HEART CATHETERIZATION WITH CORONARY ANGIOGRAM N/A 08/04/2013   Procedure: LEFT HEART CATHETERIZATION WITH CORONARY ANGIOGRAM;  Surgeon: Laurey Morale,  MD;  Location: [facility] CATH LAB;  Service: Cardiovascular;  Laterality: N/A;   PROSTATE SURGERY  02/15/2023   turbt and cystoscopy Dr. Deitra Mayo   TENDON REPAIR Left 09/20/2015   Procedure: repair nerve tendon wrist;  Surgeon: Knute Neu, MD;  Location: [facility] OR;  Service: Plastics;  Laterality: Left;   THORACOTOMY Left 02/07/2015   Procedure: THORACOTOMY MAJOR;  Surgeon: Alleen Borne, MD;  Location: [facility] OR;  Service: Thoracic;  Laterality: Left;   Patient Active Problem List   Diagnosis Date Noted   Low vitamin D level 10/20/2023   Chronic intractable headache 10/20/2023   Abnormal urinalysis 10/20/2023   Hematuria 05/14/2023   Impacted third molar tooth 05/12/2023   Urinary frequency 05/12/2023   Vitamin D deficiency 02/28/2023   Heart failure (HCC) 02/25/2023   Former  smoker 02/25/2023   Not currently working due to disabled status 02/25/2023   Benign prostatic hyperplasia with urinary obstruction 02/15/2023   Mood disorder (HCC) 12/21/2022   Prediabetes 12/18/2022   AICD (automatic cardioverter/defibrillator) present 09/03/2022   OSA (obstructive sleep apnea) 08/19/2022   Class 2 obesity due to excess calories without serious comorbidity with body mass index (BMI) of 35.0 to 35.9 in adult 07/26/2020   Polyuria 07/26/2020   Obesity (BMI 30.0-34.9) 07/04/2019   Laceration of left median nerve 05/21/2016   CKD (chronic kidney disease) stage 3, GFR 30-59 ml/min (HCC) 04/07/2016   NICM (nonischemic cardiomyopathy) (HCC) 12/15/2014   Chronic systolic CHF (congestive heart failure) (HCC) 08/23/2013   LBBB (left bundle branch block) 02/14/2010   Hypertrophic and atrophic condition of skin 02/14/2010   LEG PAIN 02/14/2010   Hyperlipidemia 01/07/2010   ERECTILE DYSFUNCTION 04/09/2009   Nodular prostate without urinary obstruction 04/09/2009   Hypertension 01/28/2009   GERD 01/28/2009   LOW BACK PAIN 01/28/2009    PCP: Lula Olszewski, MD  REFERRING PROVIDER: Antony Madura, MD  REFERRING DIAG:  R51.9,G89.29 (ICD-10-CM) - Chronic nonintractable headache, unspecified headache type  M54.2 (ICD-10-CM) - Cervicalgia    THERAPY DIAG:  Cervicalgia  Bilateral headaches  Stiffness of neck  Weakness of right shoulder  Rationale for Evaluation and Treatment: Rehabilitation  ONSET DATE: September 2024  SUBJECTIVE:                                                                                                                                                                                                         SUBJECTIVE STATEMENT: I lie on my L side on my sofa a lot of times to sleep and then I wake up with  the R side of my neck pulling and headache.  Also I can be awake watching TV and headache will come on all the sudden. I can make it go away by  relaxing my posture, turning my head side to side, or taking Tylenol.  Usually the longest it lasts is an hour Hand dominance: Right  PERTINENT HISTORY:  Progressive occurrence of headaches, referred to neurologist, who referred for CT scan and to PT  PAIN:  Are you having pain? Yes: NPRS scale: 0-6 Pain location: R upper and lower post neck, headache along front of head along hair line Pain description: pulls, develops into headache Aggravating factors: lying on L side, watching TV Relieving factors: moving, stretching head, taking tylenol  PRECAUTIONS: ICD/Pacemaker, NO ESTIM  RED FLAGS: None     WEIGHT BEARING RESTRICTIONS: No  FALLS:  Has patient fallen in last 6 months? No  LIVING ENVIRONMENT: Lives with: lives with an adult companion Lives in: House/apartment Stairs: unknown Has following equipment at home: Single point cane  OCCUPATION: disabled  PLOF: Independent  PATIENT GOALS: get rid of headaches  NEXT MD VISIT: 3 months  OBJECTIVE:  Note: Objective measures were completed at Evaluation unless otherwise noted.  DIAGNOSTIC FINDINGS:  Scheduled for MRI   PATIENT SURVEYS:  NDI Neck Disability Index score: 9 / 50 = 18.0 %  COGNITION: Overall cognitive status: Within functional limits for tasks assessed  SENSATION: WFL  POSTURE: rounded shoulders and forward head  PALPATION: Thickened, tender R cervical suboccipitals, R upper trpas, R  levator scapulae   CERVICAL ROM:   Active ROM A/PROM (deg) eval  Flexion 70  Extension 60  Right lateral flexion 60  Left lateral flexion 75  Right rotation 65  Left rotation 80   (Blank rows = not tested)  UPPER EXTREMITY ROM:B shoulder elevation reduced by 20% , to 135 degrees   UPPER EXTREMITY MMT:  MMT Right eval Left eval  Shoulder flexion 4   Shoulder extension    Shoulder abduction    Shoulder adduction    Shoulder extension    Shoulder internal rotation    Shoulder external rotation     Middle trapezius    Lower trapezius    Elbow flexion    Elbow extension    Wrist flexion    Wrist extension    Wrist ulnar deviation    Wrist radial deviation    Wrist pronation    Wrist supination    Grip strength     (Blank rows = wfl)  CERVICAL SPECIAL TESTS:  Cranial cervical flexion test: Positive, Spurling's test: Positive, and Distraction test: Positive    TREATMENT DATE: 01/13/24:                                                                                                                              Supine for manual techniques/assessment including cervical distraction, HVLA B C2/3, PA glides B C 2/3  Manual stretching for R  upper traps and R levator Contract relax with upper cervical R rotation with improved movement noted  Inst in snags for upper cervical , R rotation and provided written inst    PATIENT EDUCATION:  Education details: POC, goals  Person educated: Patient Education method: Explanation, Demonstration, Tactile cues, Verbal cues, and Handouts Education comprehension: verbalized understanding, returned demonstration, verbal cues required, and tactile cues required  HOME EXERCISE PROGRAM: SNAGS  ASSESSMENT:  CLINICAL IMPRESSION: Patient is a 63 y.o. male who was evaluated today by physical therapy due to headaches and R sided post neck pain.  Evaluation revealed upper cervical spine restriction with R rotation, also thickened , tender R upper traps , levator , and suboccipital musculature.  Responded well today to initial treatment with manual techniques for mechanical limitations.  He should benefit from skilled PT to address his headaches and improve his Sx, sleep cycle.   OBJECTIVE IMPAIRMENTS: decreased mobility, decreased ROM, decreased strength, hypomobility, and pain.   ACTIVITY LIMITATIONS: sitting and sleeping  PARTICIPATION LIMITATIONS: meal prep, cleaning, and community activity  PERSONAL FACTORS: Age, Fitness, Past/current  experiences, Time since onset of injury/illness/exacerbation, and 1-2 comorbidities: defibrillator due to CHF   are also affecting patient's functional outcome.   REHAB POTENTIAL: Good  CLINICAL DECISION MAKING: Evolving/moderate complexity  EVALUATION COMPLEXITY: Moderate   GOALS: Goals reviewed with patient? Yes  SHORT TERM GOALS: Target date: 2 weeks 01/27/24  I established home program Baseline:  Goal status: INITIAL  LONG TERM GOALS: Target date: 03/09/24  NDI improve from 18% deficit to 9% or less deficit Baseline:  Goal status: INITIAL  2.  Consistent , pain free and full ROM cervical spine for R rotation Baseline: 65 Goal status: INITIAL  3.  Reduce frequency of headaches by 50% Baseline:  Goal status: INITIAL     PLAN:  PT FREQUENCY: 1-2x/week  PT DURATION: 8 weeks  PLANNED INTERVENTIONS: 97110-Therapeutic exercises, 97530- Therapeutic activity, 97112- Neuromuscular re-education, 97535- Self Care, and 96295- Manual therapy  PLAN FOR NEXT SESSION: continue with manual techniques and stretching, NO ESTIM or IONTO due to defibrillator, progress ex, add middle traps and periscapular strengthening   Mordechai Matuszak L Averil Digman, PT, DPT, OCS 01/13/2024, 3:29 PM

## 2024-01-14 ENCOUNTER — Other Ambulatory Visit: Payer: Self-pay

## 2024-01-14 DIAGNOSIS — M542 Cervicalgia: Secondary | ICD-10-CM

## 2024-01-14 DIAGNOSIS — R519 Headache, unspecified: Secondary | ICD-10-CM

## 2024-01-17 ENCOUNTER — Telehealth: Payer: Self-pay

## 2024-01-17 NOTE — Telephone Encounter (Signed)
Patient CT Head WO contrast was approved # [id number] good until March 17, 2024.

## 2024-01-19 ENCOUNTER — Ambulatory Visit: Payer: MEDICAID

## 2024-01-19 ENCOUNTER — Telehealth: Payer: Self-pay

## 2024-01-19 NOTE — Telephone Encounter (Signed)
Pt was called x2 and message was left on to call [place] scheduling for CT scan. Told to call office for questions.

## 2024-01-21 ENCOUNTER — Ambulatory Visit: Payer: MEDICAID | Admitting: Internal Medicine

## 2024-01-24 ENCOUNTER — Telehealth (HOSPITAL_COMMUNITY): Payer: Self-pay | Admitting: Cardiology

## 2024-01-24 ENCOUNTER — Ambulatory Visit (HOSPITAL_COMMUNITY)
Admission: RE | Admit: 2024-01-24 | Discharge: 2024-01-24 | Disposition: A | Discharge status: Home / Self Care | Payer: MEDICAID | Source: Ambulatory Visit | Attending: Internal Medicine | Admitting: Internal Medicine

## 2024-01-24 DIAGNOSIS — I428 Other cardiomyopathies: Secondary | ICD-10-CM | POA: Diagnosis not present

## 2024-01-24 DIAGNOSIS — I2510 Atherosclerotic heart disease of native coronary artery without angina pectoris: Secondary | ICD-10-CM | POA: Diagnosis not present

## 2024-01-24 DIAGNOSIS — I130 Hypertensive heart and chronic kidney disease with heart failure and stage 1 through stage 4 chronic kidney disease, or unspecified chronic kidney disease: Secondary | ICD-10-CM | POA: Diagnosis not present

## 2024-01-24 DIAGNOSIS — I447 Left bundle-branch block, unspecified: Secondary | ICD-10-CM | POA: Diagnosis not present

## 2024-01-24 DIAGNOSIS — Z87891 Personal history of nicotine dependence: Secondary | ICD-10-CM | POA: Insufficient documentation

## 2024-01-24 DIAGNOSIS — E785 Hyperlipidemia, unspecified: Secondary | ICD-10-CM | POA: Diagnosis not present

## 2024-01-24 DIAGNOSIS — I5022 Chronic systolic (congestive) heart failure: Secondary | ICD-10-CM | POA: Diagnosis not present

## 2024-01-24 DIAGNOSIS — N189 Chronic kidney disease, unspecified: Secondary | ICD-10-CM | POA: Diagnosis not present

## 2024-01-24 DIAGNOSIS — Z006 Encounter for examination for normal comparison and control in clinical research program: Secondary | ICD-10-CM

## 2024-01-24 DIAGNOSIS — Z95810 Presence of automatic (implantable) cardiac defibrillator: Secondary | ICD-10-CM | POA: Insufficient documentation

## 2024-01-24 LAB — ECHOCARDIOGRAM COMPLETE
AR max vel: 3.11 cm2
AV Area VTI: 2.81 cm2
AV Area mean vel: 3.07 cm2
AV Mean grad: 2 mm[Hg]
AV Peak grad: 3.3 mm[Hg]
Ao pk vel: 0.91 m/s
Area-P 1/2: 2.99 cm2
Calc EF: 45.6 %
MV VTI: 2.62 cm2
S' Lateral: 3.7 cm
Single Plane A2C EF: 48.9 %
Single Plane A4C EF: 46.6 %

## 2024-01-24 NOTE — Telephone Encounter (Signed)
 Front office called patient at [phone] to remind patient of his f/u appt with Dr. Shirlee Latch on Tuesday 01/25/24 at 11:40 AM.   Yehuda Mao office spoke to patient directly and confirmed appointment with patient on 01/24/24.

## 2024-01-24 NOTE — Progress Notes (Signed)
  Echocardiogram 2D Echocardiogram has been performed.  Ocie Doyne RDCS 01/24/2024, 2:35 PM

## 2024-01-24 NOTE — Research (Signed)
 SITE: [id number]     Subject # [id number]   Subprotocol: A  Inclusion Criteria  Patients who meet all of the following criteria are eligible for enrollment as study participants:  Yes No  Age > 63 years old X   Eligible to wear Holter Study X    Exclusion Criteria  Patients who meet any of these criteria are not eligible for enrollment as study participants: Yes No  1. Receiving any mechanical (respiratory or circulatory) or renal support therapy at Screening or during Visit #1.  X  2.  Any other conditions that in the opinion of the investigators are likely to prevent compliance with the study protocol or pose a safety concern if the subject participates in the study.  X  3. Poor tolerance, namely susceptible to severe skin allergies from ECG adhesive patch application.  X   Protocol: REV H                                     Residential Zip code [address] (First 3 digits ONLY)                                             [employer] Informed Consent   Subject Name: Roger Crosby  Subject met inclusion and exclusion criteria.  The informed consent form, study requirements and expectations were reviewed with the subject. Subject had opportunity to read consent and questions and concerns were addressed prior to the signing of the consent form.  The subject verbalized understanding of the trial requirements.  The subject agreed to participate in the [employer] Ef ACT trial and signed the informed consent at 13:36 on 24-Jan-2024.  The informed consent was obtained prior to performance of any protocol-specific procedures for the subject.  A copy of the signed informed consent was given to the subject and a copy was placed in the subject's medical record.   Dyanne Iha          Current Outpatient Medications:    aspirin EC 81 MG tablet, Take 1 tablet (81 mg total) by mouth daily., Disp: 90 tablet, Rfl: 2   atorvastatin (LIPITOR) 20 MG tablet, Take 1 tablet every day by oral route at bedtime for 90 days.,  Disp: 90 tablet, Rfl: 3   carvedilol (COREG) 12.5 MG tablet, Take 1.5 tablets (18.75 mg total) by mouth 2 (two) times daily., Disp: 270 tablet, Rfl: 3   empagliflozin (JARDIANCE) 10 MG TABS tablet, Take 1 tablet (10 mg total) by mouth daily before breakfast., Disp: 90 tablet, Rfl: 3   famotidine-calcium carbonate-magnesium hydroxide (PEPCID COMPLETE) 10-800-165 MG chewable tablet, Chew 1 tablet by mouth daily as needed., Disp: 100 tablet, Rfl: 11   furosemide (LASIX) 20 MG tablet, Take 1 tablet (20 mg total) by mouth daily. (Patient taking differently: Take 20 mg by mouth as needed.), Disp: 90 tablet, Rfl: 3   omeprazole (PRILOSEC) 20 MG capsule, Take 1 capsule (20 mg total) by mouth daily., Disp: 90 capsule, Rfl: 3   sacubitril-valsartan (ENTRESTO) 97-103 MG, Take 1 tablet by mouth 2 (two) times daily., Disp: 180 tablet, Rfl: 3   spironolactone (ALDACTONE) 25 MG tablet, Take 1 tablet (25 mg total) by mouth daily., Disp: 90 tablet, Rfl: 3   tamsulosin (FLOMAX) 0.4 MG CAPS capsule, Take 1 capsule (0.4  mg total) by mouth in the morning., Disp: 90 capsule, Rfl: 3   Vitamin D, Ergocalciferol, (DRISDOL) 1.25 MG (50000 UNIT) CAPS capsule, Take 1 capsule (50,000 Units total) by mouth every 7 (seven) days., Disp: 12 capsule, Rfl: 1

## 2024-01-25 ENCOUNTER — Encounter (HOSPITAL_COMMUNITY): Payer: MEDICAID | Admitting: Cardiology

## 2024-01-27 ENCOUNTER — Ambulatory Visit: Payer: MEDICAID

## 2024-01-27 NOTE — Therapy (Deleted)
 OUTPATIENT PHYSICAL THERAPY CERVICAL EVALUATION   Patient Name: Roger Crosby MRN: [id number] DOB:18-Dec-1960, 63 y.o., male Today's Date: 01/27/2024  END OF SESSION:    Past Medical History:  Diagnosis Date   AICD (automatic cardioverter/defibrillator) present    a.  Unsuccessful LV lead placement 11/2014   Atypical chest pain 04/07/2016   CAD (coronary artery disease)    a. nonobstructive by [facility] 6/11: oOM1 40%, mild luminal irregs elsewhere b. cath 09/2014L no obstructive disease c. 03/2016: Low-risk NST   Chest pain 03/11/2018   CHF (congestive heart failure) (HCC)    CKD (chronic kidney disease)    Cocaine use 07/04/2019   Dribbling of urine 12/18/2022   DYSPNEA 04/04/2010   Qualifier: Diagnosis of   By: Huntley Dec, Scott       Elevated red blood cell count 12/21/2022   Lab Results  Component  Value  Date/Time     HGB  14.9  10/16/2022 10:06 AM     HGB  15.9  10/20/2021 12:08 PM     HGB  14.6  07/06/2019 02:20 AM     HGB  14.9  07/05/2019 06:57 AM     HGB  15.1  07/04/2019 04:15 AM     HGB  15.3  03/11/2018 02:00 AM         Elevated troponin 07/04/2019   GERD (gastroesophageal reflux disease)    High cholesterol    History of drug abuse (HCC) 12/18/2022   cocaine   HTN (hypertension)    Hx of echocardiogram    echo 5/11: EF 45-50%, mild HK of Ap AS wall, ap Ant wall and true apex (LAD distribution), mild LAE   LBBB (left bundle branch block)    LBP (low back pain)    NICM (nonischemic cardiomyopathy) (HCC)    Normocytic hypochromic anemia 12/21/2022   S/P ICD (internal cardiac defibrillator) procedure 02/07/2015   Substance abuse (HCC) 04/08/2016   Systolic heart failure (HCC)    Tobacco dependence 07/26/2020   Past Surgical History:  Procedure Laterality Date   BI-VENTRICULAR IMPLANTABLE CARDIOVERTER DEFIBRILLATOR N/A 12/14/2014   with failed LV lead placeemnt   BIV ICD GENERATOR CHANGEOUT N/A 11/04/2022   Procedure: BIV ICD GENERATOR CHANGEOUT;  Surgeon: Duke Salvia, MD;  Location: [facility] INVASIVE CV LAB;  Service: Cardiovascular;  Laterality: N/A;   CARDIAC CATHETERIZATION Left 07/31/2013   with angiogram   DEBRIDEMENT AND CLOSURE WOUND Right 09/20/2015   Procedure: DEBRIDEMENT AND CLOSURE WOUND;  Surgeon: Knute Neu, MD;  Location: [facility] OR;  Service: Plastics;  Laterality: Right;   EPICARDIAL PACING LEAD PLACEMENT N/A 02/07/2015   eicardial LV lead placed by Dr Renato Battles   LEFT HEART CATH AND CORONARY ANGIOGRAPHY N/A 07/05/2019   Procedure: LEFT HEART CATH AND CORONARY ANGIOGRAPHY;  Surgeon: Laurey Morale, MD;  Location: [facility] INVASIVE CV LAB;  Service: Cardiovascular;  Laterality: N/A;   LEFT HEART CATHETERIZATION WITH CORONARY ANGIOGRAM N/A 08/04/2013   Procedure: LEFT HEART CATHETERIZATION WITH CORONARY ANGIOGRAM;  Surgeon: Laurey Morale, MD;  Location: [facility] CATH LAB;  Service: Cardiovascular;  Laterality: N/A;   PROSTATE SURGERY  02/15/2023   turbt and cystoscopy Dr. Deitra Mayo   TENDON REPAIR Left 09/20/2015   Procedure: repair nerve tendon wrist;  Surgeon: Knute Neu, MD;  Location: [facility] OR;  Service: Plastics;  Laterality: Left;   THORACOTOMY Left 02/07/2015   Procedure: THORACOTOMY MAJOR;  Surgeon: Alleen Borne, MD;  Location: [facility] OR;  Service: Thoracic;  Laterality: Left;   Patient  Active Problem List   Diagnosis Date Noted   Low vitamin D level 10/20/2023   Chronic intractable headache 10/20/2023   Abnormal urinalysis 10/20/2023   Hematuria 05/14/2023   Impacted third molar tooth 05/12/2023   Urinary frequency 05/12/2023   Vitamin D deficiency 02/28/2023   Heart failure (HCC) 02/25/2023   Former smoker 02/25/2023   Not currently working due to disabled status 02/25/2023   Benign prostatic hyperplasia with urinary obstruction 02/15/2023   Mood disorder (HCC) 12/21/2022   Prediabetes 12/18/2022   AICD (automatic cardioverter/defibrillator) present 09/03/2022   OSA (obstructive sleep apnea) 08/19/2022   Class 2 obesity due to  excess calories without serious comorbidity with body mass index (BMI) of 35.0 to 35.9 in adult 07/26/2020   Polyuria 07/26/2020   Obesity (BMI 30.0-34.9) 07/04/2019   Laceration of left median nerve 05/21/2016   CKD (chronic kidney disease) stage 3, GFR 30-59 ml/min (HCC) 04/07/2016   NICM (nonischemic cardiomyopathy) (HCC) 12/15/2014   Chronic systolic CHF (congestive heart failure) (HCC) 08/23/2013   LBBB (left bundle branch block) 02/14/2010   Hypertrophic and atrophic condition of skin 02/14/2010   LEG PAIN 02/14/2010   Hyperlipidemia 01/07/2010   ERECTILE DYSFUNCTION 04/09/2009   Nodular prostate without urinary obstruction 04/09/2009   Hypertension 01/28/2009   GERD 01/28/2009   LOW BACK PAIN 01/28/2009    PCP: Lula Olszewski, MD  REFERRING PROVIDER: Antony Madura, MD  REFERRING DIAG:  [phone] (ICD-10-CM) - Chronic nonintractable headache, unspecified headache type  M54.2 (ICD-10-CM) - Cervicalgia    THERAPY DIAG:  No diagnosis found.  Rationale for Evaluation and Treatment: Rehabilitation  ONSET DATE: September 2024  SUBJECTIVE:                                                                                                                                                                                                         SUBJECTIVE STATEMENT: I lie on my L side on my sofa a lot of times to sleep and then I wake up with the R side of my neck pulling and headache.  Also I can be awake watching TV and headache will come on all the sudden. I can make it go away by relaxing my posture, turning my head side to side, or taking Tylenol.  Usually the longest it lasts is an hour Hand dominance: Right  PERTINENT HISTORY:  Progressive occurrence of headaches, referred to neurologist, who referred for CT scan and to PT  PAIN:  Are you having pain? Yes: NPRS scale: 0-6 Pain location: R upper and lower post  neck, headache along front of head along hair line Pain  description: pulls, develops into headache Aggravating factors: lying on L side, watching TV Relieving factors: moving, stretching head, taking tylenol  PRECAUTIONS: ICD/Pacemaker, NO ESTIM  RED FLAGS: None     WEIGHT BEARING RESTRICTIONS: No  FALLS:  Has patient fallen in last 6 months? No  LIVING ENVIRONMENT: Lives with: lives with an adult companion Lives in: House/apartment Stairs: unknown Has following equipment at home: Single point cane  OCCUPATION: disabled  PLOF: Independent  PATIENT GOALS: get rid of headaches  NEXT MD VISIT: 3 months  OBJECTIVE:  Note: Objective measures were completed at Evaluation unless otherwise noted.  DIAGNOSTIC FINDINGS:  Scheduled for MRI   PATIENT SURVEYS:  NDI Neck Disability Index score: 9 / 50 = 18.0 %  COGNITION: Overall cognitive status: Within functional limits for tasks assessed  SENSATION: WFL  POSTURE: rounded shoulders and forward head  PALPATION: Thickened, tender R cervical suboccipitals, R upper trpas, R  levator scapulae   CERVICAL ROM:   Active ROM A/PROM (deg) eval  Flexion 70  Extension 60  Right lateral flexion 60  Left lateral flexion 75  Right rotation 65  Left rotation 80   (Blank rows = not tested)  UPPER EXTREMITY ROM:B shoulder elevation reduced by 20% , to 135 degrees   UPPER EXTREMITY MMT:  MMT Right eval Left eval  Shoulder flexion 4   Shoulder extension    Shoulder abduction    Shoulder adduction    Shoulder extension    Shoulder internal rotation    Shoulder external rotation    Middle trapezius    Lower trapezius    Elbow flexion    Elbow extension    Wrist flexion    Wrist extension    Wrist ulnar deviation    Wrist radial deviation    Wrist pronation    Wrist supination    Grip strength     (Blank rows = wfl)  CERVICAL SPECIAL TESTS:  Cranial cervical flexion test: Positive, Spurling's test: Positive, and Distraction test: Positive    TREATMENT DATE:  01/13/24:                                                                                                                              Supine for manual techniques/assessment including cervical distraction, HVLA B C2/3, PA glides B C 2/3  Manual stretching for R upper traps and R levator Contract relax with upper cervical R rotation with improved movement noted  Inst in snags for upper cervical , R rotation and provided written inst    PATIENT EDUCATION:  Education details: POC, goals  Person educated: Patient Education method: Explanation, Demonstration, Tactile cues, Verbal cues, and Handouts Education comprehension: verbalized understanding, returned demonstration, verbal cues required, and tactile cues required  HOME EXERCISE PROGRAM: SNAGS  ASSESSMENT:  CLINICAL IMPRESSION: Patient is a 63 y.o. male who was evaluated today by physical therapy due to headaches  and R sided post neck pain.  Evaluation revealed upper cervical spine restriction with R rotation, also thickened , tender R upper traps , levator , and suboccipital musculature.  Responded well today to initial treatment with manual techniques for mechanical limitations.  He should benefit from skilled PT to address his headaches and improve his Sx, sleep cycle.   OBJECTIVE IMPAIRMENTS: decreased mobility, decreased ROM, decreased strength, hypomobility, and pain.   ACTIVITY LIMITATIONS: sitting and sleeping  PARTICIPATION LIMITATIONS: meal prep, cleaning, and community activity  PERSONAL FACTORS: Age, Fitness, Past/current experiences, Time since onset of injury/illness/exacerbation, and 1-2 comorbidities: defibrillator due to CHF   are also affecting patient's functional outcome.   REHAB POTENTIAL: Good  CLINICAL DECISION MAKING: Evolving/moderate complexity  EVALUATION COMPLEXITY: Moderate   GOALS: Goals reviewed with patient? Yes  SHORT TERM GOALS: Target date: 2 weeks 01/27/24  I established home  program Baseline:  Goal status: INITIAL  LONG TERM GOALS: Target date: 03/09/24  NDI improve from 18% deficit to 9% or less deficit Baseline:  Goal status: INITIAL  2.  Consistent , pain free and full ROM cervical spine for R rotation Baseline: 65 Goal status: INITIAL  3.  Reduce frequency of headaches by 50% Baseline:  Goal status: INITIAL     PLAN:  PT FREQUENCY: 1-2x/week  PT DURATION: 8 weeks  PLANNED INTERVENTIONS: 97110-Therapeutic exercises, 97530- Therapeutic activity, 97112- Neuromuscular re-education, 97535- Self Care, and 78469- Manual therapy  PLAN FOR NEXT SESSION: continue with manual techniques and stretching, NO ESTIM or IONTO due to defibrillator, progress ex, add middle traps and periscapular strengthening   Roger Crosby, PT, DPT, OCS 01/27/2024, 2:00 PM

## 2024-02-03 ENCOUNTER — Ambulatory Visit: Payer: Medicaid Other

## 2024-02-03 ENCOUNTER — Ambulatory Visit: Payer: MEDICAID | Attending: Neurology

## 2024-02-10 ENCOUNTER — Ambulatory Visit: Payer: MEDICAID

## 2024-02-14 ENCOUNTER — Telehealth (HOSPITAL_COMMUNITY): Payer: Self-pay

## 2024-02-14 NOTE — Telephone Encounter (Signed)
 Called and was unable to leave pt a voice message to confirm/remind patient of their appointment at [facility] on 02/15/24.

## 2024-02-14 NOTE — Addendum Note (Signed)
 Encounter addended by: Howell Rucks, RDCS on: 02/14/2024 7:38 AM  Actions taken: Imaging Exam ended

## 2024-02-15 ENCOUNTER — Encounter (HOSPITAL_COMMUNITY): Payer: MEDICAID
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
## 2024-02-28 ENCOUNTER — Ambulatory Visit: Payer: MEDICAID | Attending: Physician Assistant | Admitting: Physician Assistant

## 2024-03-03 ENCOUNTER — Ambulatory Visit (INDEPENDENT_AMBULATORY_CARE_PROVIDER_SITE_OTHER): Payer: MEDICAID

## 2024-03-03 DIAGNOSIS — I428 Other cardiomyopathies: Secondary | ICD-10-CM | POA: Diagnosis not present

## 2024-03-10 LAB — CUP PACEART REMOTE DEVICE CHECK
Battery Remaining Longevity: 65 mo
Battery Remaining Percentage: 79 %
Battery Voltage: 3.01 V
Brady Statistic AP VP Percent: 1.8 %
Brady Statistic AP VS Percent: 1 %
Brady Statistic AS VP Percent: 95 %
Brady Statistic AS VS Percent: 3.6 %
Brady Statistic RA Percent Paced: 1.7 %
Date Time Interrogation Session: 20250409145703
HighPow Impedance: 68 Ohm
HighPow Impedance: 68 Ohm
Implantable Lead Connection Status: 753985
Implantable Lead Connection Status: 753985
Implantable Lead Connection Status: 753985
Implantable Lead Implant Date: 20160115
Implantable Lead Implant Date: 20160115
Implantable Lead Implant Date: 20160310
Implantable Lead Location: 753858
Implantable Lead Location: 753859
Implantable Lead Location: 753860
Implantable Lead Model: 5076
Implantable Lead Model: 511212
Implantable Lead Serial Number: 247936
Implantable Pulse Generator Implant Date: 20231206
Lead Channel Impedance Value: 360 Ohm
Lead Channel Impedance Value: 510 Ohm
Lead Channel Impedance Value: 530 Ohm
Lead Channel Pacing Threshold Amplitude: 0.5 V
Lead Channel Pacing Threshold Amplitude: 1 V
Lead Channel Pacing Threshold Amplitude: 1 V
Lead Channel Pacing Threshold Pulse Width: 0.5 ms
Lead Channel Pacing Threshold Pulse Width: 0.5 ms
Lead Channel Pacing Threshold Pulse Width: 0.5 ms
Lead Channel Sensing Intrinsic Amplitude: 12 mV
Lead Channel Sensing Intrinsic Amplitude: 3.3 mV
Lead Channel Setting Pacing Amplitude: 2 V
Lead Channel Setting Pacing Amplitude: 2 V
Lead Channel Setting Pacing Amplitude: 2 V
Lead Channel Setting Pacing Pulse Width: 0.5 ms
Lead Channel Setting Pacing Pulse Width: 0.5 ms
Lead Channel Setting Sensing Sensitivity: 0.5 mV
Pulse Gen Serial Number: 5550090

## 2024-04-10 NOTE — Addendum Note (Signed)
 Addended by: Lott Rouleau A on: 04/10/2024 11:28 AM   Modules accepted: Orders

## 2024-04-10 NOTE — Progress Notes (Signed)
 Remote ICD transmission.

## 2024-04-18 NOTE — Progress Notes (Deleted)
 NEUROLOGY FOLLOW UP OFFICE NOTE  Roger Crosby [id number]  Subjective:  Roger Crosby is a 63 y.o. year old right handed male with a history of OSA (not on CPAP), CHF s/p PPM, pre-DM, HTN, HLD, GERD, vit D deficiency, CHF, CKD who we last saw on 12/15/23 for headaches.  To briefly review: Patient has been getting headaches for about 6 months. The head hurts in the front, along the hair line on both sides. It is a dull, hangover like headache. It appears random to the patient. He does not think the headaches occur at the same time, such as at waking up, but he will get them during the night and wake him up. He gets a headache around 2 times a day. If he takes nothing, it will last about an hour. If he takes tylenol , it will go away in 20 minutes. He takes tylenol  on average once per day. He states the headaches aren't too bad, but about 5/10 on average. He endorses phonophobia, occasional nausea and lightheaded/dizziness associated with the headaches. When he gets a headache, he will relax. Most of time this will ease the headache. He denies laying or standing making the headache worse. He does endorse neck pain that he feels is associated with the headache.   Prior to about 6 months, he would have maybe 2 headaches per months. These headaches would be on the top of the head and associated with stress. They lasted about an hour and respond well to tylenol .  Sometimes it may also have been associated with high blood pressure or eating pork.   PCP ordered an MRI brain, but this could not be done due to pacemaker.   Patient has OSA but does not use CPAP. He was told there was conflicting data about whether he had OSA or not. He is to be scheduling another sleep study, but does not have a sleep medicine specialist. He endorses poor sleep. He thinks he only sleeps an hour at a time. He has to go to the bathroom often. He thinks he gets 4-5 hours of sleep per night. He does not feel well rested during  the day. He nods off napping throughout the day.   In terms of mood, he denies any significant mood abnormalities.   Caffeine use: 3-4 sodas per week EtOH use: once per week, 1 pint of liquor Drug use: None Smoker: Very rare Family history of headaches or neurologic disease: No  Most recent Assessment and Plan (12/15/23): Roger Crosby is a 63 y.o. male who presents for evaluation of headaches. He has a relevant medical history of OSA (not on CPAP), CHF s/p PPM, pre-DM, HTN, HLD, GERD, vit D deficiency, CHF, CKD. His neurological examination is pertinent for neck tightness and reduced range of motion of the neck, but otherwise normal. The etiology of patient's headaches is likely multifactorial with contributions from cervicalgia and untreated OSA. I will get blood work and a CT head, as head imaging is indicated in new onset headaches in this age group. Patient cannot get MRI due to pacemaker. I will also refer to sleep medicine and physical therapy. Patient would like to avoid more medication, but if headaches do not improve or worsen, we discussed he may need a preventative medication to avoid medication overuse by tylenol .   PLAN: -Blood work: TSH, B12 -CT head wo contrast (given that he cannot have MRI brain due to pacemaker) -Continue vit D supplementation -PT for neck pain -Sleep medicine referral  Since their  last visit: B12 was borderline low. I recommended B12 1000 mcg daily. Patient could not be reached, so VM was left.***  Never had CT head (patient could not be reached to schedule)***  Went to ED on 01/25/24 with vertigo concerned someone had put cocaine in his drink. Urine drug screen was positive for cocaine.***  PT? Sleep medicine?  MEDICATIONS:  Outpatient Encounter Medications as of 04/26/2024  Medication Sig   aspirin  EC 81 MG tablet Take 1 tablet (81 mg total) by mouth daily.   atorvastatin  (LIPITOR) 20 MG tablet Take 1 tablet every day by oral route at bedtime for  90 days.   carvedilol  (COREG ) 12.5 MG tablet Take 1.5 tablets (18.75 mg total) by mouth 2 (two) times daily.   empagliflozin  (JARDIANCE ) 10 MG TABS tablet Take 1 tablet (10 mg total) by mouth daily before breakfast.   famotidine -calcium  carbonate-magnesium hydroxide (PEPCID  COMPLETE) 10-800-165 MG chewable tablet Chew 1 tablet by mouth daily as needed.   furosemide  (LASIX ) 20 MG tablet Take 1 tablet (20 mg total) by mouth daily. (Patient taking differently: Take 20 mg by mouth as needed.)   omeprazole  (PRILOSEC) 20 MG capsule Take 1 capsule (20 mg total) by mouth daily.   sacubitril -valsartan  (ENTRESTO ) 97-103 MG Take 1 tablet by mouth 2 (two) times daily.   spironolactone  (ALDACTONE ) 25 MG tablet Take 1 tablet (25 mg total) by mouth daily.   tamsulosin  (FLOMAX ) 0.4 MG CAPS capsule Take 1 capsule (0.4 mg total) by mouth in the morning.   Vitamin D , Ergocalciferol , (DRISDOL ) 1.25 MG (50000 UNIT) CAPS capsule Take 1 capsule (50,000 Units total) by mouth every 7 (seven) days.   No facility-administered encounter medications on file as of 04/26/2024.    PAST MEDICAL HISTORY: Past Medical History:  Diagnosis Date   AICD (automatic cardioverter/defibrillator) present    a.  Unsuccessful LV lead placement 11/2014   Atypical chest pain 04/07/2016   CAD (coronary artery disease)    a. nonobstructive by [facility] 6/11: oOM1 40%, mild luminal irregs elsewhere b. cath 09/2014L no obstructive disease c. 03/2016: Low-risk NST   Chest pain 03/11/2018   CHF (congestive heart failure) (HCC)    CKD (chronic kidney disease)    Cocaine use 07/04/2019   Dribbling of urine 12/18/2022   DYSPNEA 04/04/2010   Qualifier: Diagnosis of   By: Lelon RIGGERS, Scott       Elevated red blood cell count 12/21/2022   Lab Results  Component  Value  Date/Time     HGB  14.9  10/16/2022 10:06 AM     HGB  15.9  10/20/2021 12:08 PM     HGB  14.6  07/06/2019 02:20 AM     HGB  14.9  07/05/2019 06:57 AM     HGB  15.1  07/04/2019 04:15 AM      HGB  15.3  03/11/2018 02:00 AM         Elevated troponin 07/04/2019   GERD (gastroesophageal reflux disease)    High cholesterol    History of drug abuse (HCC) 12/18/2022   cocaine   HTN (hypertension)    Hx of echocardiogram    echo 5/11: EF 45-50%, mild HK of Ap AS wall, ap Ant wall and true apex (LAD distribution), mild LAE   LBBB (left bundle branch block)    LBP (low back pain)    NICM (nonischemic cardiomyopathy) (HCC)    Normocytic hypochromic anemia 12/21/2022   S/P ICD (internal cardiac defibrillator) procedure 02/07/2015   Substance abuse (  HCC) 04/08/2016   Systolic heart failure (HCC)    Tobacco dependence 07/26/2020    PAST SURGICAL HISTORY: Past Surgical History:  Procedure Laterality Date   BI-VENTRICULAR IMPLANTABLE CARDIOVERTER DEFIBRILLATOR N/A 12/14/2014   with failed LV lead placeemnt   BIV ICD GENERATOR CHANGEOUT N/A 11/04/2022   Procedure: BIV ICD GENERATOR CHANGEOUT;  Surgeon: Fernande Elspeth BROCKS, MD;  Location: [facility] INVASIVE CV LAB;  Service: Cardiovascular;  Laterality: N/A;   CARDIAC CATHETERIZATION Left 07/31/2013   with angiogram   DEBRIDEMENT AND CLOSURE WOUND Right 09/20/2015   Procedure: DEBRIDEMENT AND CLOSURE WOUND;  Surgeon: Balinda Rogue, MD;  Location: [facility] OR;  Service: Plastics;  Laterality: Right;   EPICARDIAL PACING LEAD PLACEMENT N/A 02/07/2015   eicardial LV lead placed by Dr Darrol   LEFT HEART CATH AND CORONARY ANGIOGRAPHY N/A 07/05/2019   Procedure: LEFT HEART CATH AND CORONARY ANGIOGRAPHY;  Surgeon: Rolan Ezra RAMAN, MD;  Location: [facility] INVASIVE CV LAB;  Service: Cardiovascular;  Laterality: N/A;   LEFT HEART CATHETERIZATION WITH CORONARY ANGIOGRAM N/A 08/04/2013   Procedure: LEFT HEART CATHETERIZATION WITH CORONARY ANGIOGRAM;  Surgeon: Ezra RAMAN Rolan, MD;  Location: [facility] CATH LAB;  Service: Cardiovascular;  Laterality: N/A;   PROSTATE SURGERY  02/15/2023   turbt and cystoscopy Dr. Emeline Daring   TENDON REPAIR Left 09/20/2015   Procedure:  repair nerve tendon wrist;  Surgeon: Balinda Rogue, MD;  Location: [facility] OR;  Service: Plastics;  Laterality: Left;   THORACOTOMY Left 02/07/2015   Procedure: THORACOTOMY MAJOR;  Surgeon: Dorise MARLA Fellers, MD;  Location: [facility] OR;  Service: Thoracic;  Laterality: Left;    ALLERGIES: No Known Allergies  FAMILY HISTORY: Family History  Problem Relation Age of Onset   Heart attack Mother        in 72's   Heart disease Mother        ischemic   Congestive Heart Failure Mother    Diabetes Mother    Hypertension Mother    Heart attack Father        in 44's   Heart disease Father        ischemic   Heart Problems Father    Crohn's disease Sister    Prostate cancer Brother    HIV/AIDS Brother     SOCIAL HISTORY: Social History   Tobacco Use   Smoking status: Former    Current packs/day: 0.12    Average packs/day: 0.1 packs/day for 25.0 years (3.0 ttl pk-yrs)    Types: Cigarettes   Smokeless tobacco: Never   Tobacco comments:    rarely  Vaping Use   Vaping status: Never Used  Substance Use Topics   Alcohol use: Yes    Comment: occasionally   Drug use: Not Currently    Types: Crack cocaine    Comment: clean x 6 mths   Social History   Social History Narrative   Are you right handed or left handed? Right   Are you currently employed ?    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   Who lives with you? roommate   What type of home do you live in: 1 story or 2 story? two    Caffeine 1 soda a dat      Objective:  Vital Signs:  There were no vitals taken for this visit.  ***  Labs and Imaging review: New results: 01/25/24: Urine drug screen positive for cocaine  12/15/23: TSH wnl B12: 273  Previously reviewed results:  10/20/23: HbA1c: 6.4 CBC w/ diff unremarkable CMET significant for glucose 105, Cr 1.36 Lipid panel: tChol 202, LDL 136, TG 116   Vit D (05/12/23): 18.65   External labs: TSH (04/21/23): 2.10   Imaging: Lumbar  spine xray (08/21/14): FINDINGS:  Five views of the lumbar spine submitted. No acute fracture or  subluxation. Moderate disc space flattening with anterior spurring  at L4-L5 and L5-S1 level. Anterior spurring noted upper endplate of  L3 vertebral body. Facet degenerative changes L4 and L5 level.   IMPRESSION:  No acute fracture or subluxation. Degenerative changes as described  above.   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

## 2024-04-19 ENCOUNTER — Ambulatory Visit (INDEPENDENT_AMBULATORY_CARE_PROVIDER_SITE_OTHER): Payer: MEDICAID | Admitting: Internal Medicine

## 2024-04-19 ENCOUNTER — Encounter: Payer: Self-pay | Admitting: Internal Medicine

## 2024-04-19 VITALS — BP 120/76 | HR 81 | Temp 98.2°F | Ht 68.0 in | Wt 219.2 lb

## 2024-04-19 DIAGNOSIS — E66811 Obesity, class 1: Secondary | ICD-10-CM | POA: Diagnosis not present

## 2024-04-19 DIAGNOSIS — K219 Gastro-esophageal reflux disease without esophagitis: Secondary | ICD-10-CM

## 2024-04-19 DIAGNOSIS — Z125 Encounter for screening for malignant neoplasm of prostate: Secondary | ICD-10-CM

## 2024-04-19 DIAGNOSIS — R7989 Other specified abnormal findings of blood chemistry: Secondary | ICD-10-CM | POA: Diagnosis not present

## 2024-04-19 DIAGNOSIS — K047 Periapical abscess without sinus: Secondary | ICD-10-CM | POA: Diagnosis not present

## 2024-04-19 DIAGNOSIS — R1900 Intra-abdominal and pelvic swelling, mass and lump, unspecified site: Secondary | ICD-10-CM

## 2024-04-19 DIAGNOSIS — I5022 Chronic systolic (congestive) heart failure: Secondary | ICD-10-CM | POA: Diagnosis not present

## 2024-04-19 DIAGNOSIS — Z0001 Encounter for general adult medical examination with abnormal findings: Secondary | ICD-10-CM | POA: Diagnosis not present

## 2024-04-19 DIAGNOSIS — N1831 Chronic kidney disease, stage 3a: Secondary | ICD-10-CM

## 2024-04-19 DIAGNOSIS — R3129 Other microscopic hematuria: Secondary | ICD-10-CM | POA: Diagnosis not present

## 2024-04-19 DIAGNOSIS — R350 Frequency of micturition: Secondary | ICD-10-CM

## 2024-04-19 DIAGNOSIS — Z6833 Body mass index (BMI) 33.0-33.9, adult: Secondary | ICD-10-CM

## 2024-04-19 LAB — CBC WITH DIFFERENTIAL/PLATELET
Basophils Absolute: 0.1 10*3/uL (ref 0.0–0.1)
Basophils Relative: 0.6 % (ref 0.0–3.0)
Eosinophils Absolute: 0.1 10*3/uL (ref 0.0–0.7)
Eosinophils Relative: 0.9 % (ref 0.0–5.0)
HCT: 47.6 % (ref 39.0–52.0)
Hemoglobin: 15.2 g/dL (ref 13.0–17.0)
Lymphocytes Relative: 23 % (ref 12.0–46.0)
Lymphs Abs: 1.9 10*3/uL (ref 0.7–4.0)
MCHC: 31.9 g/dL (ref 30.0–36.0)
MCV: 81.3 fl (ref 78.0–100.0)
Monocytes Absolute: 0.6 10*3/uL (ref 0.1–1.0)
Monocytes Relative: 7.6 % (ref 3.0–12.0)
Neutro Abs: 5.7 10*3/uL (ref 1.4–7.7)
Neutrophils Relative %: 67.9 % (ref 43.0–77.0)
Platelets: 265 10*3/uL (ref 150.0–400.0)
RBC: 5.86 Mil/uL — ABNORMAL HIGH (ref 4.22–5.81)
RDW: 15.6 % — ABNORMAL HIGH (ref 11.5–15.5)
WBC: 8.4 10*3/uL (ref 4.0–10.5)

## 2024-04-19 LAB — COMPREHENSIVE METABOLIC PANEL WITH GFR
ALT: 34 U/L (ref 0–53)
AST: 18 U/L (ref 0–37)
Albumin: 4.2 g/dL (ref 3.5–5.2)
Alkaline Phosphatase: 90 U/L (ref 39–117)
BUN: 17 mg/dL (ref 6–23)
CO2: 26 meq/L (ref 19–32)
Calcium: 8.9 mg/dL (ref 8.4–10.5)
Chloride: 106 meq/L (ref 96–112)
Creatinine, Ser: 1.39 mg/dL (ref 0.40–1.50)
GFR: 54.1 mL/min — ABNORMAL LOW (ref >60.00)
Glucose, Bld: 95 mg/dL (ref 70–99)
Potassium: 4.4 meq/L (ref 3.5–5.1)
Sodium: 140 meq/L (ref 135–145)
Total Bilirubin: 0.5 mg/dL (ref 0.2–1.2)
Total Protein: 6.6 g/dL (ref 6.0–8.3)

## 2024-04-19 LAB — LIPID PANEL
Cholesterol: 156 mg/dL (ref 0–200)
HDL: 39.9 mg/dL (ref >39.00)
LDL Cholesterol: 96 mg/dL (ref 0–99)
NonHDL: 116.13
Total CHOL/HDL Ratio: 4
Triglycerides: 99 mg/dL (ref 0.0–149.0)
VLDL: 19.8 mg/dL (ref 0.0–40.0)

## 2024-04-19 LAB — VITAMIN D 25 HYDROXY (VIT D DEFICIENCY, FRACTURES): VITD: 15.24 ng/mL — ABNORMAL LOW (ref 30.00–100.00)

## 2024-04-19 LAB — HEMOGLOBIN A1C: Hgb A1c MFr Bld: 6.1 % (ref 4.6–6.5)

## 2024-04-19 LAB — PSA: PSA: 1.32 ng/mL (ref 0.10–4.00)

## 2024-04-19 NOTE — Assessment & Plan Note (Signed)
 Will order lab testing to guide management.

## 2024-04-19 NOTE — Progress Notes (Signed)
 [facility] at [facility] [address] [place], [place] [address] Office:  [phone]  -- Annual Preventive Medical Office Visit --  Patient:  Roger Crosby      Age: 63 y.o.       Sex:  male  Date:   04/19/2024 Patient Care Team: Anthon Kins, MD as PCP - General (Internal Medicine) Darlis Eisenmenger, MD as PCP - Advanced Heart Failure (Cardiology) Jacqueline Matsu, MD as PCP - Sleep Medicine (Cardiology) Verona Goodwill, MD as PCP - Electrophysiology (Cardiology) Alvina Axon, MD (Urology) Today's Healthcare Provider: Anthon Kins, MD  ========================================= Chief complaint: Annual Exam (Pt is present for cpe he is fasting for labs )  Purpose of Visit: Comprehensive preventive health assessment and personalized health maintenance planning.  This encounter was conducted as a Comprehensive Physical Exam (CPE) preventive care annual visit. The patient's medical history and problem list were reviewed to inform individualized preventive care recommendations.   In addition he was discovered to have dental infection awaiting dental extractions, and had complaint of bad dyspepsia/epigastric pain after discontinuing proton pump inhibitor (PPI) stomach acid reducer to try to reduce pill burden.    Assessment & Plan Encounter for annual general medical examination with abnormal findings in adult Anticipatory counseling and screening completed. See AVS. Gastroesophageal reflux disease without esophagitis Chronic GERD is exacerbated by the abrupt cessation of Prilosec, presenting with nocturnal upper abdominal pain and acid reflux. Symptoms align with GERD exacerbation rather than gluten sensitivity, lactose intolerance, or heartburn. He has not been taking Prilosec and reports using beer in the morning to ease symptoms, indicating a need to restart medication. Restart Prilosec as previously prescribed. Consider Pepto Bismol and Pepcicomplete if  Prilosec is not tolerated. Educate on dietary modifications to avoid spicy foods, coffee, and alcohol. Discuss the risk of ulcer formation if GERD is untreated. Laryngopharyngeal reflux Secondary to proton pump inhibitor (PPI) stomach acid reducer discontinue. Will order lab testing to guide management., will resume proton pump inhibitor (PPI) stomach acid reducer.  I discussed with him how Pepcid  Complete would perhaps eliminate the need but it is more expensive and he could not afford to pick it up so he would just restart the PPI Dental infection Chronic dental infection with facial swelling and soreness is likely exacerbated by poor dental hygiene and acid reflux. A dental extraction is planned next month. Prescribe antibiotics to be taken 7-10 days prior to the dental extraction. Advise on the importance of completing the antibiotic course to prevent infection during dental procedures. Other microscopic hematuria Will order lab testing to guide management.  Screening for malignant neoplasm of prostate PSA shared decision making occurred. Stage 3a chronic kidney disease (HCC) Will order lab testing to guide management.  Chronic systolic CHF (congestive heart failure) (HCC) Will order lab testing to guide management.  Continue(s) with gdmt  Low vitamin D  level Will order lab testing to guide management.  Urinary frequency Will order lab testing to guide management.  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 Advise complete cessation of alcohol consumption to prevent progression to liver failure. Educate on risks of continued alcohol use, including liver failure and jaundice.   Diagnoses and all orders for this visit: Encounter for annual general medical examination with abnormal findings in adult -     Lipid panel -     Comprehensive metabolic  panel with GFR -     CBC with Differential/Platelet -     TSH Rfx on Abnormal to Free T4 -     PSA -     HgB A1c -     Vitamin D  (25 hydroxy) -     Urinalysis w microscopic + reflex cultur Gastroesophageal reflux disease without esophagitis Laryngopharyngeal reflux Dental infection Other microscopic hematuria Screening for malignant neoplasm of prostate -     PSA Stage 3a chronic kidney disease (HCC) Chronic systolic CHF (congestive heart failure) (HCC) -     Lipid panel -     Comprehensive metabolic panel with GFR -     CBC with Differential/Platelet -     TSH Rfx on Abnormal to Free T4 Low vitamin D  level -     Vitamin D  (25 hydroxy) Urinary frequency -     Urinalysis w microscopic + reflex cultur Obesity (BMI 30.0-34.9) -     TSH Rfx on Abnormal to Free T4 -     HgB A1c Abdominal swelling   Reviewed/updated/encouraged completion: Immunization History  Administered Date(s) Administered   PFIZER(Purple Top)SARS-COV-2 Vaccination 05/06/2020, 05/27/2020   Td 01/07/2010   Tdap 09/20/2015   Health Maintenance Due  Topic Date Due   Pneumococcal Vaccine 45-15 Years old (1 of 2 - PCV) Never done   Health Maintenance  Topic Date Due   Pneumococcal Vaccine 52-87 Years old (1 of 2 - PCV) Never done   Colonoscopy  03/14/2025   DTaP/Tdap/Td (3 - Td or Tdap) 09/19/2025   Hepatitis C Screening  Completed   HIV Screening  Completed   HPV VACCINES  Aged Out   Meningococcal B Vaccine  Aged Out   INFLUENZA VACCINE  Discontinued   COVID-19 Vaccine  Discontinued   Zoster Vaccines- Shingrix  Discontinued    Reviewed the following verbally with patient and provided AVS materials:  HEALTH MAINTENANCE COUNSELING AND ANTICIPATORY GUIDANCE   Preventive Measure Recommendation  Eye Exams Every 1-2 years  Dental Care Cleanings every 6 months or more, brush/floss 3x daily  Sinus Care Saline spray rinses daily  Sleep 8 hours nightly, good sleep hygiene, e-monitoring if any daytime  drowsiness  Diet Fruits/vegetables/fiber/healthy fats, balance and moderation  Exercise 150 minutes weekly  Risk Behaviors Discouraged any/all high risk behaviors   CANCER SCREENING SHARED DECISION MAKING   Penile/Testicle/Scrotum Encouraged self-monitoring and reporting of genital abnormalities. Patient reports none.  Thyroid Checked and advised to palpate thyroid for nodules  Prostate Individualized risks/benefits/costs discussed   PSA RESULTS: Lab Results  Component Value Date   PSA 1.32 04/19/2024   PSA 0.69 03/15/2015   PSA 0.43 01/07/2010   PSA 0.46 04/09/2009        Colon HM Colonoscopy          Upcoming     Colonoscopy (Every 10 Years) Next due on 03/14/2025    03/15/2015  HM COLONOSCOPY   Only the first 1 history entries have been loaded, but more history exists.                Lung Current guidelines recommend individuals aged 37 to 63 who currently smoke or formerly smoked and have a >= 20 pack-year smoking history should undergo annual screening with low-dose computed tomography (LDCT). Tobacco Use: Medium Risk (04/19/2024)   Patient History    Smoking Tobacco Use: Former    Smokeless Tobacco Use: Never    Passive Exposure: Not on file    Skin Patient denies worrisome, changing,  or new skin lesions.His skin is clear without any lesions back checked  Other Cancers Discussed lack of screening guidelines and insurance coverage for other cancer types.     Discussed the use of AI scribe software for clinical note transcription with the patient, who gave verbal consent to proceed.  History of Present Illness Roger Crosby is a 63 year old male who presents with abdominal pain and for an annual physical exam.  He has been experiencing abdominal pain for about a month, described as an upset stomach located in the upper abdomen, above the belly button, without radiation. The pain sometimes flares up in the middle of the night and is relieved by consuming  alcohol in the morning. No specific foods consistently trigger the pain, although he enjoys cheese and experiences occasional heartburn. He has not been taking Prilosec for about two months, which was previously prescribed for similar symptoms.  He has a history of medication non-compliance, having stopped most of his medications about two months ago, except for his heart medication. He mentions taking atorvastatin  for cholesterol, aspirin , and Entresto . He has not been taking Jardiance , Pepsicomplete, or spironolactone  recently. He is taking Flomax  to manage nighttime urination.  He reports a history of dental issues, with plans to have all his teeth extracted next month due to infection. He experiences soreness in his neck, which he believes may be related to his dental problems.  He mentions occasional alcohol consumption. He is trying to improve his diet by avoiding fried foods and fast food, and he has been walking for exercise. He reports weight loss, currently weighing 219 pounds, down from 230 pounds.  He experiences poor sleep, often interrupted by the need to urinate at night, which he attributes to prostate issues. He has not had a recent eye exam but notes some vision difficulties.  ROS A comprehensive ROS was negative for any concerning symptoms except the abdomen pain and chronic urinary issues and dental pain.  Completed medication reconciliation: Current Outpatient Medications on File Prior to Visit  Medication Sig   aspirin  EC 81 MG tablet Take 1 tablet (81 mg total) by mouth daily.   atorvastatin  (LIPITOR) 20 MG tablet Take 1 tablet every day by oral route at bedtime for 90 days.   carvedilol  (COREG ) 12.5 MG tablet Take 1.5 tablets (18.75 mg total) by mouth 2 (two) times daily.   empagliflozin  (JARDIANCE ) 10 MG TABS tablet Take 1 tablet (10 mg total) by mouth daily before breakfast.   furosemide  (LASIX ) 20 MG tablet Take 1 tablet (20 mg total) by mouth daily. (Patient taking  differently: Take 20 mg by mouth as needed.)   sacubitril -valsartan  (ENTRESTO ) 97-103 MG Take 1 tablet by mouth 2 (two) times daily.   spironolactone  (ALDACTONE ) 25 MG tablet Take 1 tablet (25 mg total) by mouth daily.   Vitamin D , Ergocalciferol , (DRISDOL ) 1.25 MG (50000 UNIT) CAPS capsule Take 1 capsule (50,000 Units total) by mouth every 7 (seven) days.   omeprazole  (PRILOSEC) 20 MG capsule Take 1 capsule (20 mg total) by mouth daily. (Patient not taking: Reported on 04/19/2024)   tamsulosin  (FLOMAX ) 0.4 MG CAPS capsule Take 1 capsule (0.4 mg total) by mouth in the morning. (Patient not taking: Reported on 04/19/2024)   No current facility-administered medications on file prior to visit.   Medications Discontinued During This Encounter  Medication Reason   famotidine -calcium  carbonate-magnesium hydroxide (PEPCID  COMPLETE) [phone] MG chewable tablet Completed Course  The following were reviewed and/or entered/updated into our electronic MEDICAL RECORD NUMBER  Past Medical History:  Diagnosis Date   AICD (automatic cardioverter/defibrillator) present    a.  Unsuccessful LV lead placement 11/2014   Atypical chest pain 04/07/2016   CAD (coronary artery disease)    a. nonobstructive by [facility] 6/11: oOM1 40%, mild luminal irregs elsewhere b. cath 09/2014L no obstructive disease c. 03/2016: Low-risk NST   Chest pain 03/11/2018   CHF (congestive heart failure) (HCC)    CKD (chronic kidney disease)    Cocaine use 07/04/2019   Dribbling of urine 12/18/2022   DYSPNEA 04/04/2010   Qualifier: Diagnosis of   By: Earle Glatter, Scott       Elevated red blood cell count 12/21/2022   Lab Results  Component  Value  Date/Time     HGB  14.9  10/16/2022 10:06 AM     HGB  15.9  10/20/2021 12:08 PM     HGB  14.6  07/06/2019 02:20 AM     HGB  14.9  07/05/2019 06:57 AM     HGB  15.1  07/04/2019 04:15 AM     HGB  15.3  03/11/2018 02:00 AM         Elevated troponin 07/04/2019   GERD (gastroesophageal reflux disease)     High cholesterol    History of drug abuse (HCC) 12/18/2022   cocaine   HTN (hypertension)    Hx of echocardiogram    echo 5/11: EF 45-50%, mild HK of Ap AS wall, ap Ant wall and true apex (LAD distribution), mild LAE   LBBB (left bundle branch block)    LBP (low back pain)    NICM (nonischemic cardiomyopathy) (HCC)    Normocytic hypochromic anemia 12/21/2022   S/P ICD (internal cardiac defibrillator) procedure 02/07/2015   Substance abuse (HCC) 04/08/2016   Systolic heart failure (HCC)    Tobacco dependence 07/26/2020   Past Surgical History:  Procedure Laterality Date   BI-VENTRICULAR IMPLANTABLE CARDIOVERTER DEFIBRILLATOR N/A 12/14/2014   with failed LV lead placeemnt   BIV ICD GENERATOR CHANGEOUT N/A 11/04/2022   Procedure: BIV ICD GENERATOR CHANGEOUT;  Surgeon: Verona Goodwill, MD;  Location[facility] INVASIVE CV LAB;  Service: Cardiovascular;  Laterality: N/A;   CARDIAC CATHETERIZATION Left 07/31/2013   with angiogram   DEBRIDEMENT AND CLOSURE WOUND Right 09/20/2015   Procedure: DEBRIDEMENT AND CLOSURE WOUND;  Surgeon: Mauricia South, MD;  Location: [facility] OR;  Service: Plastics;  Laterality: Right;   EPICARDIAL PACING LEAD PLACEMENT N/A 02/07/2015   eicardial LV lead placed by Dr Jurline Olmsted   LEFT HEART CATH AND CORONARY ANGIOGRAPHY N/A 07/05/2019   Procedure: LEFT HEART CATH AND CORONARY ANGIOGRAPHY;  Surgeon: Darlis Eisenmenger, MD;  Location: [facility] INVASIVE CV LAB;  Service: Cardiovascular;  Laterality: N/A;   LEFT HEART CATHETERIZATION WITH CORONARY ANGIOGRAM N/A 08/04/2013   Procedure: LEFT HEART CATHETERIZATION WITH CORONARY ANGIOGRAM;  Surgeon: Darlis Eisenmenger, MD;  Location: [facility] CATH LAB;  Service: Cardiovascular;  Laterality: N/A;   PROSTATE SURGERY  02/15/2023   turbt and cystoscopy Dr. Ruby Corporal   TENDON REPAIR Left 09/20/2015   Procedure: repair nerve tendon wrist;  Surgeon: Mauricia South, MD;  Location: [facility] OR;  Service: Plastics;  Laterality: Left;   THORACOTOMY Left 02/07/2015    Procedure: THORACOTOMY MAJOR;  Surgeon: Bartley Lightning, MD;  Location: [facility] OR;  Service: Thoracic;  Laterality: Left;   Social History   Socioeconomic History   Marital status: Married    Spouse name: Not on file   Number of children: 3  Years of education: 12 grade   Highest education level: Not on file  Occupational History   Occupation: disability  Tobacco Use   Smoking status: Former    Current packs/day: 0.12    Average packs/day: 0.1 packs/day for 25.0 years (3.0 ttl pk-yrs)    Types: Cigarettes   Smokeless tobacco: Never   Tobacco comments:    rarely  Vaping Use   Vaping status: Never Used  Substance and Sexual Activity   Alcohol use: Yes    Comment: occasionally   Drug use: Not Currently    Types: "Crack" cocaine    Comment: clean x 6 mths   Sexual activity: Yes  Other Topics Concern   Not on file  Social History Narrative   Are you right handed or left handed? Right   Are you currently employed ?    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   Who lives with you? roommate   What type of home do you live in: 1 story or 2 story? two    Caffeine 1 soda a dat   Social Drivers of Corporate investment banker Strain: Not on file  Food Insecurity: Not on file  Transportation Needs: Unmet Transportation Needs (12/23/2022)   PRAPARE - Transportation    Lack of Transportation (Medical): Yes    Lack of Transportation (Non-Medical): Yes  Physical Activity: Not on file  Stress: No Stress Concern Present (02/15/2023)   Received from [facility], [facility]   [employer] [facility] - Occupational Stress Questionnaire    Feeling of Stress : Not at all  Social Connections: Unknown (08/24/2022)   Received from [facility], [facility]   Social Network    Social Network: Not on file  Intimate Partner Violence: Not At Risk (04/21/2023)   Received from [facility], [facility]   HITS    Over the last 12 months how  often did your partner physically hurt you?: Never    Over the last 12 months how often did your partner insult you or talk down to you?: Never    Over the last 12 months how often did your partner threaten you with physical harm?: Never    Over the last 12 months how often did your partner scream or curse at you?: Never       No data to display         Family History  Problem Relation Age of Onset   Heart attack Mother        in 66's   Heart disease Mother        ischemic   Congestive Heart Failure Mother    Diabetes Mother    Hypertension Mother    Heart attack Father        in 82's   Heart disease Father        ischemic   Heart Problems Father    Crohn's disease Sister    Prostate cancer Brother    HIV/AIDS Brother   No Known Allergies Social History   Substance and Sexual Activity  Sexual Activity Yes   Social History   Tobacco Use   Smoking status: Former    Current packs/day: 0.12    Average packs/day: 0.1 packs/day for 25.0 years (3.0 ttl pk-yrs)    Types: Cigarettes   Smokeless tobacco: Never   Tobacco comments:    rarely  Vaping Use   Vaping status: Never Used  Substance  Use Topics   Alcohol use: Yes    Comment: occasionally   Drug use: Not Currently    Types: "Crack" cocaine    Comment: clean x 6 mths      04/19/2024   11:16 AM  Depression screen PHQ 2/9  Decreased Interest 0  Down, Depressed, Hopeless 0  PHQ - 2 Score 0  Altered sleeping 0  Tired, decreased energy 0  Change in appetite 0  Feeling bad or failure about yourself  0  Trouble concentrating 0  Moving slowly or fidgety/restless 0  Suicidal thoughts 0  PHQ-9 Score 0  Difficult doing work/chores Not difficult at all      12/15/2023    7:48 AM  Fall Risk   Falls in the past year? 0  Number falls in past yr: 0  Injury with Fall? 0  Follow up Falls evaluation completed     BP 120/76   Pulse 81   Temp 98.2 F (36.8 C) (Temporal)   Ht 5\' 8"  (1.727 m)   Wt 219 lb 3.2 oz  (99.4 kg)   SpO2 98%   BMI 33.33 kg/m  BP Readings from Last 3 Encounters:  04/19/24 120/76  12/15/23 103/75  10/20/23 118/83   Wt Readings from Last 10 Encounters:  04/19/24 219 lb 3.2 oz (99.4 kg)  12/15/23 226 lb (102.5 kg)  10/20/23 227 lb 6.4 oz (103.1 kg)  05/12/23 235 lb (106.6 kg)  02/25/23 235 lb (106.6 kg)  02/25/23 235 lb (106.6 kg)  01/05/23 237 lb 12.8 oz (107.9 kg)  11/04/22 225 lb (102.1 kg)  10/16/22 227 lb (103 kg)  11/11/21 208 lb 6.4 oz (94.5 kg)  Physical Exam Physical Exam  GEN: No acute distress, resting comfortably. HEENT: Tympanic membranes normal appearing bilaterally, oropharynx clear, no thyromegaly noted, no palpable lymphadenopathy or thyroid nodules. CARDIOVASCULAR: S1 and S2 heart sounds with regular rate and rhythm, no murmurs appreciated. PULMONARY: Normal work of breathing, clear to auscultation bilaterally, no crackles, wheezes, or rhonchi. ABDOMEN: Soft, nontender, Abdomen distended, possible liver enlargement. MSK: No edema, cyanosis, or clubbing noted. Supple, no nuchal rigidity, non tender neck SKIN: Warm, dry, no lesions of concern observed. No rashes, no bruising, no petechiae, no purpura, no ulcers, no venous stasis changes. No lower extremity edema appreciable despite CHF and abdomen distension NEUROLOGICAL: Cranial nerves II-XII grossly intact, strength 5/5 in upper and lower extremities, reflexes symmetric and intact bilaterally. PSYCH: Normal affect and thought content, pleasant and cooperative.  Last CBC Lab Results  Component Value Date   WBC 8.4 04/19/2024   HGB 15.2 04/19/2024   HCT 47.6 04/19/2024   MCV 81.3 04/19/2024   MCH 27.2 10/16/2022   RDW 15.6 (H) 04/19/2024   PLT 265.0 04/19/2024   Last metabolic panel Lab Results  Component Value Date   GLUCOSE 95 04/19/2024   NA 140 04/19/2024   K 4.4 04/19/2024   CL 106 04/19/2024   CO2 26 04/19/2024   BUN 17 04/19/2024   CREATININE 1.39 04/19/2024   GFR 54.10 (L)  04/19/2024   CALCIUM  8.9 04/19/2024   PHOS 3.3 02/25/2023   PROT 6.6 04/19/2024   ALBUMIN 4.2 04/19/2024   BILITOT 0.5 04/19/2024   ALKPHOS 90 04/19/2024   AST 18 04/19/2024   ALT 34 04/19/2024   ANIONGAP 10 01/05/2023   Last lipids Lab Results  Component Value Date   CHOL 156 04/19/2024   HDL 39.90 04/19/2024   LDLCALC 96 04/19/2024   LDLDIRECT 155.3 04/24/2010  TRIG 99.0 04/19/2024   CHOLHDL 4 04/19/2024   Last hemoglobin A1c Lab Results  Component Value Date   HGBA1C 6.1 04/19/2024   Last thyroid functions Lab Results  Component Value Date   TSH 2.41 12/15/2023   Last vitamin D  Lab Results  Component Value Date   VD25OH 15.24 (L) 04/19/2024   Last vitamin B12 and Folate Lab Results  Component Value Date   VITAMINB12 273 12/15/2023        ======================================  Notes:  This document was synthesized by artificial intelligence (Abridge) using HIPAA-compliant recording of the clinical interaction;   We discussed the use of AI scribe software for clinical note transcription with the patient, who gave verbal consent to proceed.    This encounter employed state-of-the-art, real-time, collaborative documentation. The patient was empowered to actively review and assist in updating their electronic medical record on a shared monitor, ensuring transparency and improving accuracy.    Prior to and at the beginning of Comprehensive Physical Exam (CPE) preventive care annual visit appointment types  we clarify to patients "Our goal today is to focus on your preventive or annual Comprehensive Physical Exam (CPE) preventive care annual visit, which typically covers routine screenings and overall health maintenance. However, if you share any new or concerning symptoms--such as dizziness, passing out, severe pain, or anything else that may point to a more serious issue--we are both legally and ethically required to evaluate it. We cannot simply overlook or  ignore such concerns, even if you later decide you don't want to discuss them, because it could jeopardize your health.  If addressing a new concern takes us  beyond the scope of the preventive visit, we may need to bill separately for that portion of care. We understand financial considerations are important, and we're happy to discuss your options if something new comes up. However, we want to be clear that once you mention a potentially serious issue, we must investigate it; we can't ethically or legally exclude that from our records or our evaluation. Please let us  know all of your questions or worries. Together, we can decide how best to manage them and how to minimize any unexpected costs, but we want to keep you safe above all else."   This disclosure is mandated by professional ethics and legal obligations, as healthcare providers must address any substantial health concerns raised during any patient interaction and a comprehensive ROS is required by insurance companies for billing preventive-care visit type.   This disclosure ultimately discourages patients financially from reporting significant health issues.

## 2024-04-19 NOTE — Telephone Encounter (Signed)
 Roger Crosby

## 2024-04-19 NOTE — Assessment & Plan Note (Signed)
 Chronic GERD is exacerbated by the abrupt cessation of Prilosec, presenting with nocturnal upper abdominal pain and acid reflux. Symptoms align with GERD exacerbation rather than gluten sensitivity, lactose intolerance, or heartburn. He has not been taking Prilosec and reports using beer in the morning to ease symptoms, indicating a need to restart medication. Restart Prilosec as previously prescribed. Consider Pepto Bismol and Pepcicomplete if Prilosec is not tolerated. Educate on dietary modifications to avoid spicy foods, coffee, and alcohol. Discuss the risk of ulcer formation if GERD is untreated.

## 2024-04-19 NOTE — Patient Instructions (Addendum)
 IMPORTANT HEALTH REMINDERS: Report any new or changing skin lesions promptly Maintain recommended screening schedules Discuss any new family history of cancer at future visits Follow up on any new symptoms that persist more than two weeks    Building Your Long-Term Health Plan  During today's preventive visit, we covered a variety of important health checks to help you stay on top of your well-being.  We also discussed strategies to maintain your health and identified some areas that might benefit from further exploration.   Preventive care visits like today's are designed to be proactive, but sometimes additional attention may be needed.  Rest assured, we're here for you.  If these areas require further evaluation or management, we'd be happy to schedule a separate, focused appointment to address them in detail.  Addressing Next Steps  [x]   Follow-up Visit: To ensure we address any unresolved issues and continue monitoring your overall health, we recommend scheduling a follow-up appointment in 1 year for your next preventive care visit. If you experience any new problems, need to discuss any medical concerns, or your condition worsens before then, please don't hesitate to call our office to schedule an appointment or seek emergency care as needed.  [x]   Preventive Measures: Maintaining healthy habits plays a crucial role in overall wellness. We recommend considering these tips: [x]   Regular appointments with dental and vision professionals [x]   Nightly nasal saline mist to keep sinuses clear [x]   Consistent toothbrushing to maintain oral health [x]   Using an app like SnoreLab to track sleep quality [x]   Routine checks of blood pressure and heart rate [x]   Medical Information: In some instances, we may require additional medical information from other providers to create a comprehensive picture of your health. If applicable, we can provide a medical information release form at the front desk for  you to sign, allowing us  to gather these records. [x]   Lab Tests: If any lab tests were ordered today, scheduling them within a week of your visit helps ensure the best possible insurance coverage.  Planning Follow Up to Work on a Problem? Make the Most of Our Focused (20 minute) Appointments  [x]   Clearly state your top concerns at the beginning of the visit to focus our discussion [x]   If you anticipate you will need more time, please inform the front desk during scheduling - we can book multiple appointments in the same week. [x]   If you have transportation problems- use our convenient video appointments or ask about transportation support. [x]   We can get down to business faster if you use MyChart to update information before the visit and submit non-urgent questions before your visit. Thank you for taking the time to provide details through MyChart.  Let our nurse know and she can import this information into your encounter documents.  Arrival and Wait Times  [x]   Arriving on time ensures that everyone receives prompt attention. [x]   Early morning (8a) and afternoon (1p) appointments tend to have shortest wait times. [x]   Unfortunately, we cannot delay appointments for late arrivals or hold slots during phone calls.  Bring to Your Next Appointment:  [x]   Medications: Please bring all your medication bottles to your next appointment to ensure we have an accurate record of your prescriptions. [x]   Health Diaries: If you're monitoring any health conditions at home, keeping a diary of your readings can be very helpful for discussions at your next appointment.  Reviewing Your Records  [x]   Review your attached preventive care  information at the end of these patient instructions. [x]   Review this early draft of your clinical encounter notes below and the final encounter summary tomorrow on MyChart after its been completed.      Getting Answers and Following Up  [x]   Simple Questions &  Concerns: For quick questions or basic follow-up after your visit, reach us  at [phone] or MyChart messaging. [x]   Complex Concerns: If your concern is more complex, scheduling an appointment might be best. Discuss this with the staff to find the most suitable option. [x]   Lab & Imaging Results: We'll contact you directly if results are abnormal or you don't use MyChart. Most normal results will be on MyChart within 2-3 business days, with a review message from Dr. Boston Byers. Haven't heard back in 2 weeks? Need results sooner? Contact us  at [phone]. [x]   Referrals: Our referral coordinator will manage specialist referrals. The specialist's office should contact you within 2 weeks to schedule an appointment. Call us  if you haven't heard from them after 2 weeks.  Staying Connected  [x]   MyChart: Activate your MyChart for the fastest way to access results and message us . See the last page of this paperwork for instructions on how to activate.  Billing  [x]   X-ray & Lab Orders: These are billed by separate companies. Contact the invoicing company directly for questions or concerns. [x]   Visit Charges: Discuss any billing inquiries with our administrative services team.  Your Satisfaction Matters  [x]   Share Your Experience: We strive for your satisfaction! If you have any complaints, or preferably compliments, please let Dr. Boston Byers know directly or contact our Practice Administrators, Olinda Bertrand or [employer], by asking at the front desk.                 Next Steps  [x]   Schedule Follow-Up:  We recommend a follow-up appointment in 1 year for your next wellness visit.  If you develop any new problems, want to address any medical issues, or your condition worsens before then, please call us  for an appointment or seek emergency care. [x]   Preventive Care:  Make sure to keep regular appointments with dental and vision professionals, use nightly nasal saline mist sprays  to keep your sinuses clear and toothbrushing to protect your teeth. Use SnoreLab App or other app to track your sleep quality. Check blood pressure and heart rate routinely. [x]   Medical Information Release:  For any relevant medical information we don't have, please sign a release form at the front desk so we can obtain it for your records. [x]   Lab Tests:  Schedule any lab tests from today for within a week to ensure best insurance coverage.    Making the Most of Our Focused (20 minute) Appointments:  [x]   Clearly state your top concerns at the beginning of the visit to focus our discussion [x]   If you anticipate you will need more time, please inform the front desk during scheduling - we can book multiple appointments in the same week. [x]   If you have transportation problems- use our convenient video appointments or ask about transportation support. [x]   We can get down to business faster if you use MyChart to update information before the visit and submit non-urgent questions before your visit. Thank you for taking the time to provide details through MyChart.  Let our nurse know and she can import this information into your encounter documents.  Arrival and Wait Times: [x]   Arriving on time ensures  that everyone receives prompt attention. [x]   Early morning (8a) and afternoon (1p) appointments tend to have shortest wait times. [x]   Unfortunately, we cannot delay appointments for late arrivals or hold slots during phone calls.  Bring to Your Next Appointment  [x]   Medications: Please bring all your medication bottles to your next appointment to ensure we have an accurate record of your prescriptions. [x]   Health Diaries: If you're monitoring any health conditions at home, keeping a diary of your readings can be very helpful for discussions at your next appointment.  Reviewing Your Records  [x]   Review your attached preventive care information at the end of these patient instructions. [x]    Review this early draft of your clinical encounter notes below and the final encounter summary tomorrow on MyChart after its been completed.   Encounter for annual general medical examination with abnormal findings in adult -     Lipid panel -     Comprehensive metabolic panel with GFR -     CBC with Differential/Platelet -     TSH Rfx on Abnormal to Free T4 -     PSA -     Hemoglobin A1c -     VITAMIN D  25 Hydroxy (Vit-D Deficiency, Fractures) -     Urinalysis w microscopic + reflex cultur  Gastroesophageal reflux disease without esophagitis  Laryngopharyngeal reflux  Dental infection  Other microscopic hematuria  Screening for malignant neoplasm of prostate -     PSA  Stage 3a chronic kidney disease (HCC)  Chronic systolic CHF (congestive heart failure) (HCC) -     Lipid panel -     Comprehensive metabolic panel with GFR -     CBC with Differential/Platelet -     TSH Rfx on Abnormal to Free T4  Low vitamin D  level -     VITAMIN D  25 Hydroxy (Vit-D Deficiency, Fractures)  Urinary frequency -     Urinalysis w microscopic + reflex cultur  Obesity (BMI 30.0-34.9) -     TSH Rfx on Abnormal to Free T4 -     Hemoglobin A1c  Abdominal swelling     Getting Answers and Following Up  [x]   Simple Questions & Concerns: For quick questions or basic follow-up after your visit, reach us  at [phone] or MyChart messaging. [x]   Complex Concerns: If your concern is more complex, scheduling an appointment might be best. Discuss this with the staff to find the most suitable option. [x]   Lab & Imaging Results: We'll contact you directly if results are abnormal or you don't use MyChart. Most normal results will be on MyChart within 2-3 business days, with a review message from Dr. Boston Byers. Haven't heard back in 2 weeks? Need results sooner? Contact us  at [phone]. [x]   Referrals: Our referral coordinator will manage specialist referrals. The specialist's office should contact  you within 2 weeks to schedule an appointment. Call us  if you haven't heard from them after 2 weeks.  Staying Connected  [x]   MyChart: Activate your MyChart for the fastest way to access results and message us . See the last page of this paperwork for instructions on how to activate.  Billing  [x]   X-ray & Lab Orders: These are billed by separate companies. Contact the invoicing company directly for questions or concerns. [x]   Visit Charges: Discuss any billing inquiries with our administrative services team.  Your Satisfaction Matters  [x]   Share Your Experience: We strive for your satisfaction! If you have any complaints, or  preferably compliments, please let Dr. Boston Byers know directly or contact our Practice Administrators, Olinda Bertrand or [employer], by asking at the front desk.

## 2024-04-19 NOTE — Assessment & Plan Note (Signed)
 Encouraged weight loss

## 2024-04-19 NOTE — Assessment & Plan Note (Signed)
 Will order lab testing to guide management.  Continue(s) with gdmt

## 2024-04-20 ENCOUNTER — Ambulatory Visit: Payer: Self-pay | Admitting: Internal Medicine

## 2024-04-20 LAB — TSH RFX ON ABNORMAL TO FREE T4: TSH: 2 u[IU]/mL (ref 0.450–4.500)

## 2024-04-20 NOTE — Progress Notes (Signed)
 Leucocyte esterase and white blood cell(s) slightly elevated but it doesn't seem clinically relevant. Chronic kidney disease is stable  Need to increase vitamin D  supplement. At least 5000 D3 daily, and if already taking that much, then double it.

## 2024-04-21 LAB — URINALYSIS W MICROSCOPIC + REFLEX CULTURE
Bacteria, UA: NONE SEEN /HPF
Bilirubin Urine: NEGATIVE
Glucose, UA: NEGATIVE
Hgb urine dipstick: NEGATIVE
Hyaline Cast: NONE SEEN /LPF
Ketones, ur: NEGATIVE
Nitrites, Initial: NEGATIVE
Protein, ur: NEGATIVE
RBC / HPF: NONE SEEN /HPF (ref 0–2)
Specific Gravity, Urine: 1.024 (ref 1.001–1.035)
pH: 5.5 (ref 5.0–8.0)

## 2024-04-21 LAB — URINE CULTURE
MICRO NUMBER:: 16486658
Result:: NO GROWTH
SPECIMEN QUALITY:: ADEQUATE

## 2024-04-21 LAB — CULTURE INDICATED

## 2024-04-22 NOTE — Progress Notes (Signed)
 Urinalysis was slightly abnormal- recommend repeat with culture in a few weeks.

## 2024-04-25 ENCOUNTER — Other Ambulatory Visit: Payer: Self-pay

## 2024-04-25 ENCOUNTER — Telehealth: Payer: Self-pay

## 2024-04-25 DIAGNOSIS — K219 Gastro-esophageal reflux disease without esophagitis: Secondary | ICD-10-CM

## 2024-04-25 DIAGNOSIS — R350 Frequency of micturition: Secondary | ICD-10-CM

## 2024-04-25 MED ORDER — AMOXICILLIN-POT CLAVULANATE 875-125 MG PO TABS: 1.0000 | ORAL_TABLET | Freq: Two times a day (BID) | ORAL | 0 refills | Status: DC

## 2024-04-25 MED ORDER — OMEPRAZOLE 20 MG PO CPDR: 20.0000 mg | DELAYED_RELEASE_CAPSULE | Freq: Every day | ORAL | 3 refills | Status: DC

## 2024-04-25 NOTE — Telephone Encounter (Signed)
 Copied from CRM [id number]. Topic: Clinical - Medication Question >> Apr 25, 2024  3:31 PM Jenice Mitts wrote: Reason for CRM:  Patient is calling because he was supposed to be sent in an antibitic and never got it Patient is wanting to know if it still can be sent in   Called pt back about abx. Pt is requesting abx for dental appt stated spoke with pcp about it at last appt.

## 2024-04-25 NOTE — Addendum Note (Signed)
 Addended by: Victoriano Campion G on: 04/25/2024 05:40 PM   Modules accepted: Orders

## 2024-04-26 ENCOUNTER — Encounter: Payer: Self-pay | Admitting: Neurology

## 2024-04-26 ENCOUNTER — Ambulatory Visit: Payer: MEDICAID | Admitting: Neurology

## 2024-05-03 ENCOUNTER — Other Ambulatory Visit: Payer: MEDICAID

## 2024-05-04 ENCOUNTER — Ambulatory Visit: Payer: Medicaid Other

## 2024-05-15 ENCOUNTER — Telehealth: Payer: Self-pay

## 2024-05-15 ENCOUNTER — Other Ambulatory Visit (HOSPITAL_COMMUNITY): Payer: Self-pay

## 2024-05-15 NOTE — Telephone Encounter (Signed)
 Pharmacy Patient Advocate Encounter   Received notification from Onbase that prior authorization for Jardiance  10MG  tablets is required/requested.   Insurance verification completed.   The patient is insured through [facility] .   Per test claim: PA required; PA submitted to above mentioned insurance via CoverMyMeds Key/confirmation #/EOC [id number] Status is pending

## 2024-05-15 NOTE — Telephone Encounter (Signed)
 Pharmacy Patient Advocate Encounter  Received notification from [facility] that Prior Authorization for Jardiance  10MG  tablets  has been DENIED.  Full denial letter will be uploaded to the media tab. See denial reason below.   PA #/Case ID/Reference #: [id number]

## 2024-05-25 ENCOUNTER — Ambulatory Visit: Payer: Self-pay

## 2024-05-25 NOTE — Telephone Encounter (Signed)
 Patient scheduled with Corean for 05/26/24

## 2024-05-25 NOTE — Telephone Encounter (Signed)
 FYI Only or Action Required?: FYI only for provider.  Patient was last seen in primary care on 04/19/2024 by Jesus Bernardino MATSU, MD. Called Nurse Triage reporting Influenza. Symptoms began yesterday. Interventions attempted: Nothing. Symptoms are: unchanged.  Triage Disposition: Call PCP Within 24 Hours  Patient/caregiver understands and will follow disposition?: Yes      Reason for Disposition  [1] Patient is NOT HIGH RISK AND [2] strongly requests antiviral medicine AND [3] flu symptoms present < 48 hours  Answer Assessment - Initial Assessment Questions 1. WORST SYMPTOM: What is your worst symptom? (e.g., cough, runny nose, muscle aches, headache, sore throat, fever)      Cough, runny nose, headache, congestion 2. ONSET: When did your flu symptoms start?      2 days ago 3. COUGH: How bad is the cough?       Not bad anymore 4. RESPIRATORY DISTRESS: Describe your breathing.      denies 5. FEVER: Do you have a fever? If Yes, ask: What is your temperature, how was it measured, and when did it start?     denies 6. EXPOSURE: Were you exposed to someone with influenza?       no 7. FLU VACCINE: Did you get a flu shot this year?     no 8. HIGH RISK DISEASE: Do you have any chronic medical problems? (e.g., heart or lung disease, asthma, weak immune system, or other HIGH RISK conditions)     Heart failure 9. PREGNANCY: Is there any chance you are pregnant? When was your last menstrual period?     na 10. OTHER SYMPTOMS: Do you have any other symptoms?  (e.g., runny nose, muscle aches, headache, sore throat)       no  Protocols used: Influenza - [facility]

## 2024-05-26 ENCOUNTER — Ambulatory Visit (INDEPENDENT_AMBULATORY_CARE_PROVIDER_SITE_OTHER): Payer: MEDICAID | Admitting: Family

## 2024-05-26 ENCOUNTER — Encounter: Payer: Self-pay | Admitting: Family

## 2024-05-26 DIAGNOSIS — R350 Frequency of micturition: Secondary | ICD-10-CM | POA: Diagnosis not present

## 2024-05-26 NOTE — Progress Notes (Signed)
 Patient ID: Roger Crosby, male    DOB: [birth year], 63 y.o.   MRN: [id number]  Chief Complaint  Patient presents with  . Cough    Pt c/o cough and congestion but states he is now feeling better.States Jesus ordered a future urine for him to do also. Has finished amoxicillin  but would like more  Discussed the use of AI scribe software for clinical note transcription with the patient, who gave verbal consent to proceed.  History of Present Illness Roger Crosby is a 63 year old male who presents with congestion and recent respiratory symptoms.  He experiences congestion and respiratory symptoms, which have improved significantly after three doses of Mucinex. Initially, he had wheezing, particularly at night, but this has improved. His cough was productive of white sputum, and he had a runny nose. He experienced a fever about two days ago, waking up sweaty, but does not have a thermometer to check his temperature. He ensures adequate hydration, drinking a lot of water , especially with Mucinex, to help loosen mucus. Occasionally, his right ear bothers him, which he attributes to dental issues. Currently, he has no wheezing, significant cough, or ear pain, but confirms experiencing night sweats and occasional right ear discomfort.  Assessment & Plan Viral upper Respiratory Infection Symptoms improved with Mucinex and hydration. No wheezing or significant cough. Lungs clear. - Continue Mucinex and hydration. - Monitor for changes in mucus color, increased fatigue, or fever. - Contact clinic if symptoms worsen or fever develops.  Pending Urine Test Urine test ordered by Dr. Jesus remains incomplete. - Instructed to complete the urine test at the lab as ordered by Dr. Jesus.  Subjective:    Outpatient Medications Prior to Visit  Medication Sig Dispense Refill  . aspirin  EC 81 MG tablet Take 1 tablet (81 mg total) by mouth daily. 90 tablet 2  . atorvastatin  (LIPITOR) 20 MG tablet Take 1  tablet every day by oral route at bedtime for 90 days. 90 tablet 3  . carvedilol  (COREG ) 12.5 MG tablet Take 1.5 tablets (18.75 mg total) by mouth 2 (two) times daily. 270 tablet 3  . empagliflozin  (JARDIANCE ) 10 MG TABS tablet Take 1 tablet (10 mg total) by mouth daily before breakfast. 90 tablet 3  . furosemide  (LASIX ) 20 MG tablet Take 1 tablet (20 mg total) by mouth daily. 90 tablet 3  . omeprazole  (PRILOSEC) 20 MG capsule Take 1 capsule (20 mg total) by mouth daily. 90 capsule 3  . sacubitril -valsartan  (ENTRESTO ) 97-103 MG Take 1 tablet by mouth 2 (two) times daily. 180 tablet 3  . spironolactone  (ALDACTONE ) 25 MG tablet Take 1 tablet (25 mg total) by mouth daily. 90 tablet 3  . tamsulosin  (FLOMAX ) 0.4 MG CAPS capsule Take 1 capsule (0.4 mg total) by mouth in the morning. 90 capsule 3  . Vitamin D , Ergocalciferol , (DRISDOL ) 1.25 MG (50000 UNIT) CAPS capsule Take 1 capsule (50,000 Units total) by mouth every 7 (seven) days. 12 capsule 1  . amoxicillin -clavulanate (AUGMENTIN ) 875-125 MG tablet Take 1 tablet by mouth 2 (two) times daily. 20 tablet 0   No facility-administered medications prior to visit.   Past Medical History:  Diagnosis Date  . AICD (automatic cardioverter/defibrillator) present    a.  Unsuccessful LV lead placement 11/2014  . Atypical chest pain 04/07/2016  . CAD (coronary artery disease)    a. nonobstructive by LHC 6/11: oOM1 40%, mild luminal irregs elsewhere b. cath 09/2014L no obstructive disease c. 03/2016: Low-risk NST  . Chest pain  03/11/2018  . CHF (congestive heart failure) (HCC)   . CKD (chronic kidney disease)   . Cocaine use 07/04/2019  . Dribbling of urine 12/18/2022  . DYSPNEA 04/04/2010   Qualifier: Diagnosis of   By: Lelon RIGGERS, Scott      . Elevated red blood cell count 12/21/2022   Lab Results  Component  Value  Date/Time     HGB  14.9  10/16/2022 10:06 AM     HGB  15.9  10/20/2021 12:08 PM     HGB  14.6  07/06/2019 02:20 AM     HGB  14.9  07/05/2019  06:57 AM     HGB  15.1  07/04/2019 04:15 AM     HGB  15.3  03/11/2018 02:00 AM        . Elevated troponin 07/04/2019  . GERD (gastroesophageal reflux disease)   . High cholesterol   . History of drug abuse (HCC) 12/18/2022   cocaine  . HTN (hypertension)   . Hx of echocardiogram    echo 5/11: EF 45-50%, mild HK of Ap AS wall, ap Ant wall and true apex (LAD distribution), mild LAE  . LBBB (left bundle branch block)   . LBP (low back pain)   . NICM (nonischemic cardiomyopathy) (HCC)   . Normocytic hypochromic anemia 12/21/2022  . S/P ICD (internal cardiac defibrillator) procedure 02/07/2015  . Substance abuse (HCC) 04/08/2016  . Systolic heart failure (HCC)   . Tobacco dependence 07/26/2020   Past Surgical History:  Procedure Laterality Date  . BI-VENTRICULAR IMPLANTABLE CARDIOVERTER DEFIBRILLATOR N/A 12/14/2014   with failed LV lead placeemnt  . BIV ICD GENERATOR CHANGEOUT N/A 11/04/2022   Procedure: BIV ICD GENERATOR CHANGEOUT;  Surgeon: Fernande Elspeth BROCKS, MD;  Location: [facility] INVASIVE CV LAB;  Service: Cardiovascular;  Laterality: N/A;  . CARDIAC CATHETERIZATION Left 07/31/2013   with angiogram  . DEBRIDEMENT AND CLOSURE WOUND Right 09/20/2015   Procedure: DEBRIDEMENT AND CLOSURE WOUND;  Surgeon: Balinda Rogue, MD;  Location: [facility] OR;  Service: Plastics;  Laterality: Right;  . EPICARDIAL PACING LEAD PLACEMENT N/A 02/07/2015   eicardial LV lead placed by Dr Darrol  . LEFT HEART CATH AND CORONARY ANGIOGRAPHY N/A 07/05/2019   Procedure: LEFT HEART CATH AND CORONARY ANGIOGRAPHY;  Surgeon: Rolan Ezra RAMAN, MD;  Location: [facility] INVASIVE CV LAB;  Service: Cardiovascular;  Laterality: N/A;  . LEFT HEART CATHETERIZATION WITH CORONARY ANGIOGRAM N/A 08/04/2013   Procedure: LEFT HEART CATHETERIZATION WITH CORONARY ANGIOGRAM;  Surgeon: Ezra RAMAN Rolan, MD;  Location: [facility] C[facility] LAB;  Service: Cardiovascular;  Laterality: N/A;  . PROSTATE SURGERY  02/15/2023   turbt and cystoscopy Dr. Emeline Daring  .  TENDON REPAIR Left 09/20/2015   Procedure: repair nerve tendon wrist;  Surgeon: Balinda Rogue, MD;  Location: [facility] OR;  Service: Plastics;  Laterality: Left;  . THORACOTOMY Left 02/07/2015   Procedure: THORACOTOMY MAJOR;  Surgeon: Dorise MARLA Fellers, MD;  Location: [facility] OR;  Service: Thoracic;  Laterality: Left;   No Known Allergies    Objective:    Physical Exam Vitals and nursing note reviewed.  Constitutional:      General: He is not in acute distress.    Appearance: Normal appearance.  HENT:     Head: Normocephalic.     Right Ear: Tympanic membrane and ear canal normal.     Left Ear: Tympanic membrane and ear canal normal.     Nose:     Right Sinus: No maxillary sinus tenderness or frontal sinus tenderness.  Left Sinus: No maxillary sinus tenderness or frontal sinus tenderness.     Mouth/Throat:     Mouth: Mucous membranes are moist.     Pharynx: No pharyngeal swelling, oropharyngeal exudate, posterior oropharyngeal erythema or uvula swelling.     Tonsils: No tonsillar exudate or tonsillar abscesses.   Cardiovascular:     Rate and Rhythm: Normal rate and regular rhythm.  Pulmonary:     Effort: Pulmonary effort is normal.     Breath sounds: Normal breath sounds.   Musculoskeletal:        General: Normal range of motion.     Cervical back: Normal range of motion.  Lymphadenopathy:     Head:     Right side of head: No preauricular or posterior auricular adenopathy.     Left side of head: No preauricular or posterior auricular adenopathy.     Cervical: No cervical adenopathy.   Skin:    General: Skin is warm and dry.   Neurological:     Mental Status: He is alert and oriented to person, place, and time.   Psychiatric:        Mood and Affect: Mood normal.   BP 110/72   Pulse 78   Temp 97.8 F (36.6 C)   Ht 5' 8 (1.727 m)   Wt 220 lb (99.8 kg)   SpO2 95%   BMI 33.45 kg/m  Wt Readings from Last 3 Encounters:  05/26/24 220 lb (99.8 kg)  04/19/24 219 lb 3.2 oz  (99.4 kg)  12/15/23 226 lb (102.5 kg)      Lucius Krabbe, NP

## 2024-05-27 LAB — URINE CULTURE
MICRO NUMBER:: 16634344
SPECIMEN QUALITY:: ADEQUATE

## 2024-05-29 ENCOUNTER — Other Ambulatory Visit (HOSPITAL_COMMUNITY): Payer: Self-pay

## 2024-05-29 ENCOUNTER — Telehealth: Payer: Self-pay

## 2024-05-29 DIAGNOSIS — I5022 Chronic systolic (congestive) heart failure: Secondary | ICD-10-CM

## 2024-05-29 NOTE — Telephone Encounter (Signed)
 Called and lm on pt vm tcb, when pt returns call please take down his questions please if I'm unavailable.  Copied from CRM [id number]. Topic: Clinical - Medication Question >> May 29, 2024 11:06 AM [place]ique E wrote: Reason for CRM: Patient called and would like to speak with PCP's nurse in regards to his empagliflozin  (JARDIANCE ) 10 MG TABS tablet. Callback number [phone].

## 2024-06-05 ENCOUNTER — Ambulatory Visit: Payer: Self-pay

## 2024-06-12 ENCOUNTER — Ambulatory Visit (INDEPENDENT_AMBULATORY_CARE_PROVIDER_SITE_OTHER): Payer: MEDICAID

## 2024-06-12 DIAGNOSIS — I428 Other cardiomyopathies: Secondary | ICD-10-CM

## 2024-06-13 ENCOUNTER — Ambulatory Visit: Payer: Self-pay | Admitting: Cardiology

## 2024-06-13 LAB — CUP PACEART REMOTE DEVICE CHECK
Battery Remaining Longevity: 64 mo
Battery Remaining Percentage: 77 %
Battery Voltage: 2.99 V
Brady Statistic AP VP Percent: 1.5 %
Brady Statistic AP VS Percent: 1 %
Brady Statistic AS VP Percent: 95 %
Brady Statistic AS VS Percent: 3.5 %
Brady Statistic RA Percent Paced: 1.5 %
Date Time Interrogation Session: 20250714162800
HighPow Impedance: 72 Ohm
HighPow Impedance: 72 Ohm
Implantable Lead Connection Status: 753985
Implantable Lead Connection Status: 753985
Implantable Lead Connection Status: 753985
Implantable Lead Implant Date: 20160115
Implantable Lead Implant Date: 20160115
Implantable Lead Implant Date: 20160310
Implantable Lead Location: 753858
Implantable Lead Location: 753859
Implantable Lead Location: 753860
Implantable Lead Model: 5076
Implantable Lead Model: 511212
Implantable Lead Serial Number: 247936
Implantable Pulse Generator Implant Date: 20231206
Lead Channel Impedance Value: 380 Ohm
Lead Channel Impedance Value: 440 Ohm
Lead Channel Impedance Value: 580 Ohm
Lead Channel Pacing Threshold Amplitude: 0.5 V
Lead Channel Pacing Threshold Amplitude: 1 V
Lead Channel Pacing Threshold Amplitude: 1 V
Lead Channel Pacing Threshold Pulse Width: 0.5 ms
Lead Channel Pacing Threshold Pulse Width: 0.5 ms
Lead Channel Pacing Threshold Pulse Width: 0.5 ms
Lead Channel Sensing Intrinsic Amplitude: 12 mV
Lead Channel Sensing Intrinsic Amplitude: 4.2 mV
Lead Channel Setting Pacing Amplitude: 2 V
Lead Channel Setting Pacing Amplitude: 2 V
Lead Channel Setting Pacing Amplitude: 2 V
Lead Channel Setting Pacing Pulse Width: 0.5 ms
Lead Channel Setting Pacing Pulse Width: 0.5 ms
Lead Channel Setting Sensing Sensitivity: 0.5 mV
Pulse Gen Serial Number: 5550090

## 2024-06-22 ENCOUNTER — Ambulatory Visit (HOSPITAL_BASED_OUTPATIENT_CLINIC_OR_DEPARTMENT_OTHER)
Admission: RE | Admit: 2024-06-22 | Discharge: 2024-06-22 | Disposition: A | Discharge status: Home / Self Care | Payer: MEDICAID | Source: Ambulatory Visit | Attending: Cardiology | Admitting: Cardiology

## 2024-06-22 ENCOUNTER — Ambulatory Visit (HOSPITAL_COMMUNITY): Payer: Self-pay | Admitting: Cardiology

## 2024-06-22 ENCOUNTER — Other Ambulatory Visit (HOSPITAL_COMMUNITY): Payer: Self-pay

## 2024-06-22 ENCOUNTER — Encounter (HOSPITAL_COMMUNITY): Payer: Self-pay | Admitting: Cardiology

## 2024-06-22 ENCOUNTER — Ambulatory Visit (HOSPITAL_COMMUNITY)
Admission: RE | Admit: 2024-06-22 | Discharge: 2024-06-22 | Disposition: A | Discharge status: Home / Self Care | Payer: MEDICAID | Source: Ambulatory Visit | Attending: Cardiology | Admitting: Cardiology

## 2024-06-22 ENCOUNTER — Telehealth (HOSPITAL_COMMUNITY): Payer: Self-pay

## 2024-06-22 VITALS — BP 138/102 | HR 71 | Ht 68.0 in | Wt 225.8 lb

## 2024-06-22 DIAGNOSIS — Z79899 Other long term (current) drug therapy: Secondary | ICD-10-CM | POA: Diagnosis not present

## 2024-06-22 DIAGNOSIS — Z95810 Presence of automatic (implantable) cardiac defibrillator: Secondary | ICD-10-CM | POA: Insufficient documentation

## 2024-06-22 DIAGNOSIS — I5022 Chronic systolic (congestive) heart failure: Secondary | ICD-10-CM | POA: Diagnosis present

## 2024-06-22 DIAGNOSIS — N1831 Chronic kidney disease, stage 3a: Secondary | ICD-10-CM | POA: Insufficient documentation

## 2024-06-22 DIAGNOSIS — I428 Other cardiomyopathies: Secondary | ICD-10-CM | POA: Insufficient documentation

## 2024-06-22 DIAGNOSIS — Z7984 Long term (current) use of oral hypoglycemic drugs: Secondary | ICD-10-CM | POA: Insufficient documentation

## 2024-06-22 DIAGNOSIS — I130 Hypertensive heart and chronic kidney disease with heart failure and stage 1 through stage 4 chronic kidney disease, or unspecified chronic kidney disease: Secondary | ICD-10-CM | POA: Insufficient documentation

## 2024-06-22 DIAGNOSIS — F1411 Cocaine abuse, in remission: Secondary | ICD-10-CM | POA: Diagnosis not present

## 2024-06-22 LAB — ECHOCARDIOGRAM COMPLETE
Area-P 1/2: 4.15 cm2
S' Lateral: 3.5 cm

## 2024-06-22 LAB — BASIC METABOLIC PANEL WITH GFR
Anion gap: 7 (ref 5–15)
BUN: 14 mg/dL (ref 8–23)
CO2: 24 mmol/L (ref 22–32)
Calcium: 8.9 mg/dL (ref 8.9–10.3)
Chloride: 109 mmol/L (ref 98–111)
Creatinine, Ser: 1.48 mg/dL — ABNORMAL HIGH (ref 0.61–1.24)
GFR, Estimated: 53 mL/min — ABNORMAL LOW (ref >60)
Glucose, Bld: 113 mg/dL — ABNORMAL HIGH (ref 70–99)
Potassium: 4.5 mmol/L (ref 3.5–5.1)
Sodium: 140 mmol/L (ref 135–145)

## 2024-06-22 LAB — BRAIN NATRIURETIC PEPTIDE: B Natriuretic Peptide: 42.7 pg/mL (ref 0.0–100.0)

## 2024-06-22 MED ORDER — EMPAGLIFLOZIN 10 MG PO TABS: 10.0000 mg | ORAL_TABLET | Freq: Every day | ORAL | 3 refills | Status: AC

## 2024-06-22 MED ORDER — CARVEDILOL 25 MG PO TABS: 25.0000 mg | ORAL_TABLET | Freq: Two times a day (BID) | ORAL | 3 refills | Status: AC

## 2024-06-22 NOTE — Patient Instructions (Signed)
 RESTART Jardiance  10 mg daily.  INCREASE Carvedilol  to 25 mg Twice daily  Labs done today, your results will be available in MyChart, we will contact you for abnormal readings.  Your physician recommends that you schedule a follow-up appointment in: 4 months.  If you have any questions or concerns before your next appointment please send us  a message through [place] or call our office at [phone].    TO LEAVE A MESSAGE FOR THE NURSE SELECT OPTION 2, PLEASE LEAVE A MESSAGE INCLUDING: YOUR NAME DATE OF BIRTH CALL BACK NUMBER REASON FOR CALL**this is important as we prioritize the call backs  YOU WILL RECEIVE A CALL BACK THE SAME DAY AS LONG AS YOU CALL BEFORE 4:00 PM  At the [facility], you and your health needs are our priority. As part of our continuing mission to provide you with exceptional heart care, we have created designated Provider Care Teams. These Care Teams include your primary Cardiologist (physician) and Advanced Practice Providers (APPs- Physician Assistants and Nurse Practitioners) who all work together to provide you with the care you need, when you need it.   You may see any of the following providers on your designated Care Team at your next follow up: Dr Toribio Fuel Dr Ezra Shuck Dr. Ria Commander Dr. Morene Brownie Amy Lenetta, NP Caffie Shed, [place] [facility], [place] Beckey Coe, NP [place] Lee, NP Ellouise Class, NP Tinnie Redman, PharmD Jaun Bash, PharmD   Please be sure to bring in all your medications bottles to every appointment.    Thank you for choosing [facility]

## 2024-06-22 NOTE — Telephone Encounter (Signed)
 Advanced Heart Failure Patient Advocate Encounter  Prior authorization for Jardiance  10 MG has been submitted and approved. Test billing returns refill too soon rejection. Test billing for 25 mg returns $0 copay for 90 day supply.  Key: [id number] Effective: 06/22/2024 to 06/22/2025  Rachel DEL, CPhT Rx Patient Advocate Phone: [phone]

## 2024-06-22 NOTE — Progress Notes (Signed)
 Patient ID: Roger Crosby, male   DOB: 07/18/1961, 63 y.o.   MRN: [id number]  PCP: Jesus Bernardino MATSU, MD Cardiology: Dr. Rolan  Chief complaint: CHF  63 y.o. with history of LBBB, nonischemic cardiomyopathy, severe OSA, HTN, CKD IIIa, GERD, hx substance abuse.  Echo in 2014 showed EF 20-25% with regional wall motion abnormalities.  LHC 9/14 showed no obstructive disease.  He has a nonischemic cardiomyopathy.  He has a [employer] school [employer] CRT-D device.   He was lost to followup for about 2 years (was in prison).  He presented to [facility] ER in Apr 04, 2024 with chest pain.  UDS was positive for cocaine.  He says that he messed up and was grieving for a death in the family.  Echo in 04/04/24 showed EF 50-55% with mild MR.  Cardiolite  in 04-04-24 showed EF 40% with no perfusion defect (low risk).    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  Echo in 8/20 showed EF 40-45%.    Echo 11/22: EF 40-45%, moderate LVH, diffuse hypokinesis, PASP 33, mild MR.    Echo was done today and reviewed, EF up to 45-50%, moderate LVH, normal RV size with mildly decreased systolic function, IVC normal.   Today he returns for HF follow up. Weight is down 12 lbs since last appointment.  He says he is not using cocaine, last positive UDS was in 2/25.  No chest pain or exertional dyspnea.  BP elevated, he says that he has taken all his medications except for Jardiance  which he has been out of for several months.  No lightheadedness, no palpitations.  No orthopnea/PND.   ECG (personally reviewed): NSR, BiV paced  St Jude device interrogation (personally reviewed): 96% BiV pacing, no VT/AF, thoracic impedance mildly decreased.   Labs (11/23): K 4.9, creatinine 1.56 Labs (5/25): K 4.4, creatinine 1.39, LDL 96, hgb 15.2, TSH normal  PMH: 1. Hypertension  2. GERD  3. Low back pain  4. LBBB: Newly noted 2011  5. Nonischemic cardiomyopathy: Echo (5/11): EF 45-50%,  mild hypokinesis of the apical anteroseptal wall, apical anterior wall, and true apex (LAD distribution), mild LAE.  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  Echo (6/14) with EF 20-25% with inferior/inferoseptal akinesis and hypokinesis elsewhere.  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  - St Jude CRT-D - Echo Apr 04, 2024) with EF 50-55%, mild MR. - Cardiolite  2024-04-04) with EF 40%, no significant perfusion defect.  - LHC (8/20): anomalous LCx off the RCA, but no significant CAD.  - Echo (8/20): EF 40-45%.  - Echo (11/22): EF 40-45%, moderate LVH, diffuse hypokinesis, PASP 33 mmHg, mild MR.  - Echo (7/25): EF 45-50%, moderate LVH, normal RV size with mildly decreased systolic function, IVC normal.  6. CKD IIIa 7. OSA: Severe on sleep study.  8. Hx cocaine abuse  Family History:  Mother deceased CHF,DM,HTN  Father with heart trouble...died age 50.  1 sister Crohn's disease  1 sister healthy  1 brother died Prostate cancer/AIDS...was diagnosed with prostate cancer at age 42.  Father and mother both had MIs in their 56s   Social History:  Occupation:prior Air traffic controller, does some landscaping now.   HS graduate.  Married  3 kids  Alcohol use-no. None since 2008  H/o cocaine use, last positive UDS in 2/25.  Prior smoker none since 2008.   Review of Systems  All systems  reviewed and negative except as per HPI.   Current Outpatient Medications  Medication Sig Dispense Refill   aspirin  EC 81 MG tablet Take 1 tablet (81 mg total) by mouth daily. 90 tablet 2   atorvastatin  (LIPITOR) 20 MG tablet Take 1 tablet every day by oral route at bedtime for 90 days. 90 tablet 3   furosemide  (LASIX ) 20 MG tablet Take 1 tablet (20 mg total) by mouth daily. 90 tablet 3   omeprazole  (PRILOSEC) 20 MG capsule Take 1 capsule (20 mg total) by mouth daily. 90 capsule 3   sacubitril -valsartan  (ENTRESTO ) 97-103 MG Take 1 tablet by mouth 2 (two) times  daily. 180 tablet 3   spironolactone  (ALDACTONE ) 25 MG tablet Take 1 tablet (25 mg total) by mouth daily. 90 tablet 3   tamsulosin  (FLOMAX ) 0.4 MG CAPS capsule Take 1 capsule (0.4 mg total) by mouth in the morning. 90 capsule 3   Vitamin D , Ergocalciferol , (DRISDOL ) 1.25 MG (50000 UNIT) CAPS capsule Take 1 capsule (50,000 Units total) by mouth every 7 (seven) days. 12 capsule 1   carvedilol  (COREG ) 25 MG tablet Take 1 tablet (25 mg total) by mouth 2 (two) times daily. 180 tablet 3   empagliflozin  (JARDIANCE ) 10 MG TABS tablet Take 1 tablet (10 mg total) by mouth daily before breakfast. 90 tablet 3   No current facility-administered medications for this encounter.   Wt Readings from Last 3 Encounters:  06/22/24 102.4 kg (225 lb 12.8 oz)  05/26/24 99.8 kg (220 lb)  04/19/24 99.4 kg (219 lb 3.2 oz)   BP (!) 138/102   Pulse 71   Ht 5' 8 (1.727 m)   Wt 102.4 kg (225 lb 12.8 oz)   SpO2 98%   BMI 34.33 kg/m  General: NAD Neck: No JVD, no thyromegaly or thyroid nodule.  Lungs: Clear to auscultation bilaterally with normal respiratory effort. CV: Nondisplaced PMI.  Heart regular S1/S2, no S3/S4, no murmur.  No peripheral edema.  No carotid bruit.  Normal pedal pulses.  Abdomen: Soft, nontender, no hepatosplenomegaly, no distention.  Skin: Intact without lesions or rashes.  Neurologic: Alert and oriented x 3.  Psych: Normal affect. Extremities: No clubbing or cyanosis.  HEENT: Normal.   Assessment/Plan: 1. Nonischemic cardiomyopathy: Possibly related to cocaine, also HTN may play a role.  No ETOH, reports no recent cocaine (last positive UDS in 2/25).  EF 20-25% with LBBB in 2014, had St Jude CRT-D device placed with improvement in LV function.  No significant CAD on cath, most recently in 8/20.  Echo (4/19) showed improvement in EF to 50-55%, and 8/20 echo showed EF back down a bit to 40-45%. Echo 11/22 showed stable EF 40-45%.  Echo today showed EF 45-50%.  NYHA class I symptoms.  Thoracic  impedance trending down on device interrogation but he does not look volume overloaded on exam.  - Restart Jardiance  10 mg daily, this should help take off some fluid. BMET/BNP today.  - Increase Coreg  to 25 mg bid. - Continue Entresto  97/103 mg bid.   - Continue spironolactone  25 mg daily.   2. CKD: Stage 3. BMET today.  - Restart Jardiance .  3. HTN: BP Elevated.  - Increasing Coreg .  4. OSA: Suspected, arrange sleep study.  5. Cocaine abuse: Strongly urged him to avoid. Last positive UDS in 2/25.   Follow up 4 months with APP.   I spent 31 minutes reviewing records, interviewing/examining patient, and managing orders.    Ezra Shuck  06/22/2024

## 2024-06-23 ENCOUNTER — Encounter: Payer: Self-pay | Admitting: Internal Medicine

## 2024-06-23 ENCOUNTER — Ambulatory Visit: Payer: MEDICAID | Admitting: Internal Medicine

## 2024-07-06 ENCOUNTER — Encounter: Payer: Self-pay | Admitting: Cardiology

## 2024-07-13 ENCOUNTER — Other Ambulatory Visit (HOSPITAL_COMMUNITY): Payer: Self-pay

## 2024-07-13 ENCOUNTER — Telehealth (HOSPITAL_COMMUNITY): Payer: Self-pay | Admitting: Pharmacy Technician

## 2024-07-13 NOTE — Telephone Encounter (Signed)
 Advanced Heart Failure Patient Advocate Encounter  Patient called office stating that Bernadine is not going through at [facility]. Looks like test claim may have gotten stuck through insurance. Called and spoke with insurance. They were able to back out the test claim and I was able to run a test claim afterwards that went through for $0.  Called and spoke with [facility]. They are not able to run the RX yet but will try again in an hour. Called and updated the patient. Advised him to call back if needed.  Almarie JULIANNA Pa, CPhT

## 2024-07-14 ENCOUNTER — Other Ambulatory Visit (HOSPITAL_COMMUNITY): Payer: Self-pay

## 2024-07-26 ENCOUNTER — Other Ambulatory Visit: Payer: Self-pay | Admitting: Internal Medicine

## 2024-08-03 ENCOUNTER — Ambulatory Visit: Payer: Medicaid Other

## 2024-08-09 ENCOUNTER — Other Ambulatory Visit (HOSPITAL_COMMUNITY): Payer: Self-pay

## 2024-08-10 ENCOUNTER — Telehealth: Payer: Self-pay

## 2024-08-10 ENCOUNTER — Other Ambulatory Visit (HOSPITAL_COMMUNITY): Payer: Self-pay

## 2024-08-10 NOTE — Telephone Encounter (Signed)
 Pharmacy Patient Advocate Encounter   Received notification from Onbase that prior authorization for Sacubitril -Valsartan  97-103MG  tablets is required/requested.   Insurance verification completed.   The patient is insured through [facility] .   Per test claim: The current 90 day co-pay is, $0.00.  No PA needed at this time. This test claim was processed through [facility]- copay amounts may vary at other pharmacies due to pharmacy/plan contracts, or as the patient moves through the different stages of their insurance plan.     - Insurance only covers brand name only.

## 2024-09-01 ENCOUNTER — Ambulatory Visit: Payer: Self-pay

## 2024-09-07 NOTE — Progress Notes (Signed)
 Remote ICD Transmission

## 2024-09-11 ENCOUNTER — Ambulatory Visit (INDEPENDENT_AMBULATORY_CARE_PROVIDER_SITE_OTHER): Payer: MEDICAID

## 2024-09-11 DIAGNOSIS — I428 Other cardiomyopathies: Secondary | ICD-10-CM

## 2024-09-13 LAB — CUP PACEART REMOTE DEVICE CHECK
Battery Remaining Longevity: 62 mo
Battery Remaining Percentage: 73 %
Battery Voltage: 2.98 V
Brady Statistic AP VP Percent: 1.5 %
Brady Statistic AP VS Percent: 1 %
Brady Statistic AS VP Percent: 95 %
Brady Statistic AS VS Percent: 3.3 %
Brady Statistic RA Percent Paced: 1.4 %
Date Time Interrogation Session: 20251014161733
HighPow Impedance: 72 Ohm
HighPow Impedance: 72 Ohm
Implantable Lead Connection Status: 753985
Implantable Lead Connection Status: 753985
Implantable Lead Connection Status: 753985
Implantable Lead Implant Date: 20160115
Implantable Lead Implant Date: 20160115
Implantable Lead Implant Date: 20160310
Implantable Lead Location: 753858
Implantable Lead Location: 753859
Implantable Lead Location: 753860
Implantable Lead Model: 5076
Implantable Lead Model: 511212
Implantable Lead Serial Number: 247936
Implantable Pulse Generator Implant Date: 20231206
Lead Channel Impedance Value: 390 Ohm
Lead Channel Impedance Value: 480 Ohm
Lead Channel Impedance Value: 590 Ohm
Lead Channel Pacing Threshold Amplitude: 0.5 V
Lead Channel Pacing Threshold Amplitude: 1 V
Lead Channel Pacing Threshold Amplitude: 1 V
Lead Channel Pacing Threshold Pulse Width: 0.5 ms
Lead Channel Pacing Threshold Pulse Width: 0.5 ms
Lead Channel Pacing Threshold Pulse Width: 0.5 ms
Lead Channel Sensing Intrinsic Amplitude: 12 mV
Lead Channel Sensing Intrinsic Amplitude: 4.7 mV
Lead Channel Setting Pacing Amplitude: 2 V
Lead Channel Setting Pacing Amplitude: 2 V
Lead Channel Setting Pacing Amplitude: 2 V
Lead Channel Setting Pacing Pulse Width: 0.5 ms
Lead Channel Setting Pacing Pulse Width: 0.5 ms
Lead Channel Setting Sensing Sensitivity: 0.5 mV
Pulse Gen Serial Number: 5550090

## 2024-09-14 NOTE — Progress Notes (Signed)
 Remote ICD Transmission

## 2024-09-19 ENCOUNTER — Ambulatory Visit (HOSPITAL_BASED_OUTPATIENT_CLINIC_OR_DEPARTMENT_OTHER): Payer: MEDICAID | Attending: Cardiology | Admitting: Cardiology

## 2024-09-25 ENCOUNTER — Ambulatory Visit: Payer: MEDICAID | Admitting: Internal Medicine

## 2024-09-25 ENCOUNTER — Ambulatory Visit (INDEPENDENT_AMBULATORY_CARE_PROVIDER_SITE_OTHER): Payer: MEDICAID

## 2024-09-25 ENCOUNTER — Ambulatory Visit: Payer: MEDICAID

## 2024-09-25 ENCOUNTER — Encounter: Payer: Self-pay | Admitting: Internal Medicine

## 2024-09-25 VITALS — BP 132/82 | HR 77 | Temp 98.2°F | Ht 68.0 in | Wt 224.6 lb

## 2024-09-25 DIAGNOSIS — I5022 Chronic systolic (congestive) heart failure: Secondary | ICD-10-CM

## 2024-09-25 DIAGNOSIS — G8929 Other chronic pain: Secondary | ICD-10-CM

## 2024-09-25 DIAGNOSIS — M79604 Pain in right leg: Secondary | ICD-10-CM | POA: Diagnosis not present

## 2024-09-25 DIAGNOSIS — M5441 Lumbago with sciatica, right side: Secondary | ICD-10-CM | POA: Diagnosis not present

## 2024-09-25 DIAGNOSIS — M5442 Lumbago with sciatica, left side: Secondary | ICD-10-CM

## 2024-09-25 DIAGNOSIS — M79605 Pain in left leg: Secondary | ICD-10-CM

## 2024-09-25 DIAGNOSIS — Z1211 Encounter for screening for malignant neoplasm of colon: Secondary | ICD-10-CM

## 2024-09-25 DIAGNOSIS — L905 Scar conditions and fibrosis of skin: Secondary | ICD-10-CM

## 2024-09-25 MED ORDER — SPIRONOLACTONE 25 MG PO TABS: 25.0000 mg | ORAL_TABLET | Freq: Every day | ORAL | 3 refills | Status: AC

## 2024-09-25 MED ORDER — LIDOCAINE 5 % EX OINT: 1.0000 | TOPICAL_OINTMENT | CUTANEOUS | 0 refills | Status: AC | PRN

## 2024-09-25 NOTE — Progress Notes (Addendum)
 ==============================  [facility]: [phone]   -- Medical Office Visit --  Patient: Roger Crosby      Age: 63 y.o.       Sex:  male  Date:   09/25/2024 Today's Healthcare Provider: Bernardino KANDICE Cone, MD  ==============================   Chief Complaint: Discuss Health concerns (Pt is needing more help at home not able to stand good or get around he he states here today in office.)  Discussed the use of AI scribe software for clinical note transcription with the patient, who gave verbal consent to proceed.  History of Present Illness  63 year old male who presents for paperwork related to home health services and requests a colonoscopy due to family history of colon cancer.  He is experiencing significant back pain and weakness in his legs, which affects his ability to stand for extended periods, cook, and clean. This has resulted in taking four hours to cook a meal. He leans forward due to the pain and weakness.  He reports numbness in both legs, primarily in the hip area, which he associates with his back problems. No recent falls have occurred.  He has a history of a defibrillator implantation over seven years ago and reports soreness at the surgical site.  He is currently taking spironolactone , which was recently refilled. He does not have a home blood pressure monitor but plans to acquire one.  He requests a colonoscopy, stating he has never had one before and mentions a family history of colon cancer, as his brother had the condition.  He is trying to change his health insurance to [employer] to access more benefits, as he feels he is not receiving adequate benefits for his age. Living Situation: Lives alone and requires assistance with daily activities due to physical limitations. - The patient requires home health assistance for cooking, cleaning, and personal care due to back pain and leg weakness. He is seeking to  increase his home health service hours. The patient has a defibrillator and experiences pain at the surgical site. He is not currently working and is trying to change his health insurance to [employer]. Background Reviewed: Problem List: has Hyperlipidemia; ERECTILE DYSFUNCTION; Hypertension; LBBB (left bundle branch block); GERD; Nodular prostate without urinary obstruction; Hypertrophic and atrophic condition of skin; LOW BACK PAIN; LEG PAIN; Chronic systolic CHF (congestive heart failure) (HCC); NICM (nonischemic cardiomyopathy) (HCC); CKD (chronic kidney disease) stage 3, GFR 30-59 ml/min (HCC); Obesity (BMI 30.0-34.9); Class 2 obesity due to excess calories without serious comorbidity with body mass index (BMI) of 35.0 to 35.9 in adult; Polyuria; AICD (automatic cardioverter/defibrillator) present; Erythrocytosis; OSA (obstructive sleep apnea); Prediabetes; Mood disorder; Laceration of left median nerve; Heart failure (HCC); Benign prostatic hyperplasia with urinary obstruction; Former smoker; Not currently working due to disabled status; Vitamin D  deficiency; Impacted third molar tooth; Urinary frequency; Hematuria; Low vitamin D  level; Chronic intractable headache; and Abnormal urinalysis on their problem list. Past Medical History:  has a past medical history of AICD (automatic cardioverter/defibrillator) present, Atypical chest pain (04/07/2016), CAD (coronary artery disease), Chest pain (03/11/2018), CHF (congestive heart failure) (HCC), CKD (chronic kidney disease), Cocaine use (07/04/2019), Dribbling of urine (12/18/2022), DYSPNEA (04/04/2010), Elevated red blood cell count (12/21/2022), Elevated troponin (07/04/2019), GERD (gastroesophageal reflux disease), High cholesterol, History of drug abuse (HCC) (12/18/2022), HTN (hypertension), echocardiogram, LBBB (left bundle branch block), LBP (low back pain), NICM (nonischemic cardiomyopathy) (HCC), Normocytic hypochromic anemia (12/21/2022), S/P  ICD (internal cardiac defibrillator) procedure (02/07/2015),  Substance abuse (HCC) (04/08/2016), Systolic heart failure (HCC), and Tobacco dependence (07/26/2020). Past Surgical History:   has a past surgical history that includes left heart catheterization with coronary angiogram (N/A, 08/04/2013); Cardiac catheterization (Left, 07/31/2013); bi-ventricular implantable cardioverter defibrillator (N/A, 12/14/2014); Thoracotomy (Left, 02/07/2015); Epicardial pacing lead placement (N/A, 02/07/2015); Tendon repair (Left, 09/20/2015); Debridement and closure wound (Right, 09/20/2015); LEFT HEART CATH AND CORONARY ANGIOGRAPHY (N/A, 07/05/2019); BIV ICD GENERATOR CHANGEOUT (N/A, 11/04/2022); and Prostate surgery (02/15/2023). Social History:   reports that he has quit smoking. His smoking use included cigarettes. He has a 3 pack-year smoking history. He has never used smokeless tobacco. He reports current alcohol use. He reports that he does not currently use drugs after having used the following drugs: Crack cocaine. Family History:  family history includes Congestive Heart Failure in his mother; Crohn's disease in his sister; Diabetes in his mother; HIV/AIDS in his brother; Heart Problems in his father; Heart attack in his father and mother; Heart disease in his father and mother; Hypertension in his mother; Prostate cancer in his brother. Allergies:  has no known allergies.   Medication Reconciliation: Current Outpatient Medications on File Prior to Visit  Medication Sig   aspirin  EC 81 MG tablet Take 1 tablet (81 mg total) by mouth daily.   atorvastatin  (LIPITOR) 20 MG tablet Take 1 tablet every day by oral route at bedtime for 90 days.   carvedilol  (COREG ) 12.5 MG tablet TAKE 1 & 1/2 TABLETS (18.75 MG TOTAL) BY MOUTH 2 (TWO) TIMES DAILY.   empagliflozin  (JARDIANCE ) 10 MG TABS tablet Take 1 tablet (10 mg total) by mouth daily before breakfast.   furosemide  (LASIX ) 20 MG tablet Take 1 tablet (20 mg total)  by mouth daily.   omeprazole  (PRILOSEC) 20 MG capsule Take 1 capsule (20 mg total) by mouth daily.   sacubitril -valsartan  (ENTRESTO ) 97-103 MG Take 1 tablet by mouth 2 (two) times daily.   tamsulosin  (FLOMAX ) 0.4 MG CAPS capsule Take 1 capsule (0.4 mg total) by mouth in the morning.   Vitamin D , Ergocalciferol , (DRISDOL ) 1.25 MG (50000 UNIT) CAPS capsule Take 1 capsule (50,000 Units total) by mouth every 7 (seven) days.   carvedilol  (COREG ) 25 MG tablet Take 1 tablet (25 mg total) by mouth 2 (two) times daily.   No current facility-administered medications on file prior to visit.   Medications Discontinued During This Encounter  Medication Reason   spironolactone  (ALDACTONE ) 25 MG tablet Reorder     Physical Exam:    09/25/2024    9:05 AM 06/22/2024   10:34 AM 05/26/2024    7:42 AM  Vitals with BMI  Height 5' 8 5' 8 5' 8  Weight 224 lbs 10 oz 225 lbs 13 oz 220 lbs  BMI 34.16 34.34 33.46  Systolic 132 138 889  Diastolic 82 102 72  Pulse 77 71 78  Vital signs reviewed.  Nursing notes reviewed. Weight trend reviewed. Physical Activity: Not on file   General Appearance:  No acute distress appreciable.   Well-groomed, healthy-appearing male.  Well proportioned with no abnormal fat distribution.  Good muscle tone. Pulmonary:  Normal work of breathing at rest, no respiratory distress apparent. SpO2: 98 %  Musculoskeletal: All extremities are intact.  Neurological:  Awake, alert, oriented, and engaged.  No obvious focal neurological deficits or cognitive impairments.  Sensorium seems unclouded.   Speech is clear and coherent with logical content. Psychiatric:  Appropriate mood, pleasant and cooperative demeanor, thoughtful and engaged during the exam  Walks without difficulty  to the xray department.       09/25/2024    9:10 AM 04/19/2024   11:16 AM 10/20/2023    2:10 PM 02/25/2023   10:26 AM  PHQ 2/9 Scores  PHQ - 2 Score 0 0 1 2  PHQ- 9 Score 0 0 8 4   Clinical Support on  09/11/2024  Component Date Value Ref Range Status   Date Time Interrogation Session 09/12/2024 [id number]   Final   Pulse Generator Manufacturer 09/12/2024 SJCR   Final   Pulse Gen Model 09/12/2024 3357-40Q Unify Assura   Final   Pulse Gen Serial Number 09/12/2024 [id number]   Final   Clinic Name 09/12/2024 [facility]   Final   Implantable Pulse Generator Type 09/12/2024 Cardiac Resynch Therapy Defibulator   Final   Implantable Pulse Generator Implan* 09/12/2024 [id number]   Final   Implantable Lead Manufacturer 09/12/2024 OTHER   Final   Implantable Lead Model 09/12/2024 [id number] Myopore   Final   Implantable Lead Serial Number 09/12/2024 [id number]   Final   Implantable Lead Implant Date 09/12/2024 [id number]   Final   Implantable Lead Location Detail 1 09/12/2024 UNKNOWN   Final   Implantable Lead Location 09/12/2024 246141   Final   Implantable Lead Connection Status 09/12/2024 246014   Final   Implantable Lead Manufacturer 09/12/2024 MERM   Final   Implantable Lead Model 09/12/2024 5076 CapSureFix Novus   Final   Implantable Lead Serial Number 09/12/2024 [id number]   Final   Implantable Lead Implant Date 09/12/2024 [id number]   Final   Implantable Lead Location Detail 1 09/12/2024 UNKNOWN   Final   Implantable Lead Location 09/12/2024 246140   Final   Implantable Lead Connection Status 09/12/2024 246014   Final   Implantable Lead Manufacturer 09/12/2024 MERM   Final   Implantable Lead Model 09/12/2024 6935M Sprint Quattro Secure S   Final   Implantable Lead Serial Number 09/12/2024 [id number]   Final   Implantable Lead Implant Date 09/12/2024 [id number]   Final   Implantable Lead Location Detail 1 09/12/2024 UNKNOWN   Final   Implantable Lead Location 09/12/2024 246139   Final   Implantable Lead Connection Status 09/12/2024 246014   Final   Lead Channel Setting Sensing Sensi* 09/12/2024 0.5  mV Final   Lead Channel Setting Sensing Adapt* 09/12/2024 Adaptive Sensing   Final   Lead Channel  Setting Pacing Amplit* 09/12/2024 2.0  V Final   Lead Channel Setting Pacing Pulse * 09/12/2024 0.5  ms Final   Lead Channel Setting Pacing Amplit* 09/12/2024 2.0  V Final   Lead Channel Setting Pacing Pulse * 09/12/2024 0.5  ms Final   Lead Channel Setting Pacing Amplit* 09/12/2024 2.0  V Final   Lead Channel Setting Pacing Captur* 09/12/2024 Fixed Pacing   Final   Zone Setting Status 09/12/2024 Active   Final   Zone Setting Status 09/12/2024 Inactive   Final   Zone Setting Status 09/12/2024 Active   Final   Lead Channel Status 09/12/2024 NULL   Final   Lead Channel Impedance Value 09/12/2024 390  ohm Final   Lead Channel Pacing Threshold Ampl* 09/12/2024 1.0  V Final   Lead Channel Pacing Threshold Puls* 09/12/2024 0.5  ms Final   Lead Channel Status 09/12/2024 NULL   Final   Lead Channel Impedance Value 09/12/2024 480  ohm Final   Lead Channel Sensing Intrinsic Amp* 09/12/2024 4.7  mV Final   Lead Channel Pacing Threshold Ampl* 09/12/2024 0.5  V Final  Lead Channel Pacing Threshold Puls* 09/12/2024 0.5  ms Final   Lead Channel Status 09/12/2024 NULL   Final   Lead Channel Impedance Value 09/12/2024 590  ohm Final   Lead Channel Sensing Intrinsic Amp* 09/12/2024 12.0  mV Final   Lead Channel Pacing Threshold Ampl* 09/12/2024 1.0  V Final   Lead Channel Pacing Threshold Puls* 09/12/2024 0.5  ms Final   HighPow Impedance 09/12/2024 72  ohm Final   HighPow Impedance 09/12/2024 72  ohm Final   HighPow Imped Status 09/12/2024 NULL   Final   HighPow Imped Status 09/12/2024 NULL   Final   Battery Status 09/12/2024 MOS   Final   Battery Remaining Longevity 09/12/2024 62  mo Final   Battery Remaining Percentage 09/12/2024 73.0  % Final   Battery Voltage 09/12/2024 2.98  V Final   Brady Statistic RA Percent Paced 09/12/2024 1.4  % Final   Brady Statistic AP VP Percent 09/12/2024 1.5  % Final   Brady Statistic AS VP Percent 09/12/2024 95.0  % Final   Brady Statistic AP VS Percent 09/12/2024  1.0  % Final   Brady Statistic AS VS Percent 09/12/2024 3.3  % Final  Hospital Outpatient Visit on 06/22/2024  Component Date Value Ref Range Status   Sodium 06/22/2024 140  135 - 145 mmol/L Final   Potassium 06/22/2024 4.5  3.5 - 5.1 mmol/L Final   Chloride 06/22/2024 109  98 - 111 mmol/L Final   CO2 06/22/2024 24  22 - 32 mmol/L Final   Glucose, Bld 06/22/2024 113 (H)  70 - 99 mg/dL Final   BUN 92/75/7974 14  8 - 23 mg/dL Final   Creatinine, Ser 06/22/2024 1.48 (H)  0.61 - 1.24 mg/dL Final   Calcium  06/22/2024 8.9  8.9 - 10.3 mg/dL Final   GFR, Estimated 06/22/2024 53 (L)  >60 mL/min Final   Anion gap 06/22/2024 7  5 - 15 Final   B Natriuretic Peptide 06/22/2024 42.7  0.0 - 100.0 pg/mL Final  Hospital Outpatient Visit on 06/22/2024  Component Date Value Ref Range Status   S' Lateral 06/22/2024 3.50  cm Corrected   Area-P 1/2 06/22/2024 4.15  cm2 Corrected   Est EF 06/22/2024 50 - 55%   Corrected  Clinical Support on 06/12/2024  Component Date Value Ref Range Status   Date Time Interrogation Session 06/12/2024 [id number]   Final   Pulse Generator Manufacturer 06/12/2024 SJCR   Final   Pulse Gen Model 06/12/2024 3357-40Q Unify Assura   Final   Pulse Gen Serial Number 06/12/2024 [id number]   Final   Clinic Name 06/12/2024 [facility]   Final   Implantable Pulse Generator Type 06/12/2024 Cardiac Resynch Therapy Defibulator   Final   Implantable Pulse Generator Implan* 06/12/2024 [id number]   Final   Implantable Lead Manufacturer 06/12/2024 OTHER   Final   Implantable Lead Model 06/12/2024 488787 Myopore   Final   Implantable Lead Serial Number 06/12/2024 [id number]   Final   Implantable Lead Implant Date 06/12/2024 [id number]   Final   Implantable Lead Location Detail 1 06/12/2024 UNKNOWN   Final   Implantable Lead Location 06/12/2024 246141   Final   Implantable Lead Connection Status 06/12/2024 246014   Final   Implantable Lead Manufacturer 06/12/2024 MERM   Final   Implantable Lead  Model 06/12/2024 5076 CapSureFix Novus   Final   Implantable Lead Serial Number 06/12/2024 [id number]   Final   Implantable Lead Implant Date 06/12/2024 [id number]   Final  Implantable Lead Location Detail 1 06/12/2024 UNKNOWN   Final   Implantable Lead Location 06/12/2024 246140   Final   Implantable Lead Connection Status 06/12/2024 246014   Final   Implantable Lead Manufacturer 06/12/2024 MERM   Final   Implantable Lead Model 06/12/2024 6935M Sprint Quattro Secure S   Final   Implantable Lead Serial Number 06/12/2024 [id number]   Final   Implantable Lead Implant Date 06/12/2024 [id number]   Final   Implantable Lead Location Detail 1 06/12/2024 UNKNOWN   Final   Implantable Lead Location 06/12/2024 246139   Final   Implantable Lead Connection Status 06/12/2024 246014   Final   Lead Channel Setting Sensing Sensi* 06/12/2024 0.5  mV Final   Lead Channel Setting Sensing Adapt* 06/12/2024 Adaptive Sensing   Final   Lead Channel Setting Pacing Amplit* 06/12/2024 2.0  V Final   Lead Channel Setting Pacing Pulse * 06/12/2024 0.5  ms Final   Lead Channel Setting Pacing Amplit* 06/12/2024 2.0  V Final   Lead Channel Setting Pacing Pulse * 06/12/2024 0.5  ms Final   Lead Channel Setting Pacing Amplit* 06/12/2024 2.0  V Final   Lead Channel Setting Pacing Captur* 06/12/2024 Fixed Pacing   Final   Zone Setting Status 06/12/2024 Active   Final   Zone Setting Status 06/12/2024 Inactive   Final   Zone Setting Status 06/12/2024 Active   Final   Lead Channel Status 06/12/2024 NULL   Final   Lead Channel Impedance Value 06/12/2024 380  ohm Final   Lead Channel Pacing Threshold Ampl* 06/12/2024 1.0  V Final   Lead Channel Pacing Threshold Puls* 06/12/2024 0.5  ms Final   Lead Channel Status 06/12/2024 NULL   Final   Lead Channel Impedance Value 06/12/2024 440  ohm Final   Lead Channel Sensing Intrinsic Amp* 06/12/2024 4.2  mV Final   Lead Channel Pacing Threshold Ampl* 06/12/2024 0.5  V Final   Lead  Channel Pacing Threshold Puls* 06/12/2024 0.5  ms Final   Lead Channel Status 06/12/2024 NULL   Final   Lead Channel Impedance Value 06/12/2024 580  ohm Final   Lead Channel Sensing Intrinsic Amp* 06/12/2024 12.0  mV Final   Lead Channel Pacing Threshold Ampl* 06/12/2024 1.0  V Final   Lead Channel Pacing Threshold Puls* 06/12/2024 0.5  ms Final   HighPow Impedance 06/12/2024 72  ohm Final   HighPow Impedance 06/12/2024 72  ohm Final   HighPow Imped Status 06/12/2024 NULL   Final   HighPow Imped Status 06/12/2024 NULL   Final   Battery Status 06/12/2024 MOS   Final   Battery Remaining Longevity 06/12/2024 64  mo Final   Battery Remaining Percentage 06/12/2024 77.0  % Final   Battery Voltage 06/12/2024 2.99  V Final   Brady Statistic RA Percent Paced 06/12/2024 1.5  % Final   Brady Statistic AP VP Percent 06/12/2024 1.5  % Final   Brady Statistic AS VP Percent 06/12/2024 95.0  % Final   Brady Statistic AP VS Percent 06/12/2024 1.0  % Final   Brady Statistic AS VS Percent 06/12/2024 3.5  % Final  Office Visit on 05/26/2024  Component Date Value Ref Range Status   MICRO NUMBER: 05/26/2024 [id number]   Final   SPECIMEN QUALITY: 05/26/2024 Adequate   Final   Sample Source 05/26/2024 URINE   Final   STATUS: 05/26/2024 FINAL   Final   Result: 05/26/2024 Less than 10,000 CFU/mL of single Gram positive organism isolated. No further testing  will be performed. If clinically indicated, recollection using a method to minimize contamination, with prompt transfer to Urine Culture Transport Tube, is recommended.   Final  Office Visit on 04/19/2024  Component Date Value Ref Range Status   Cholesterol 04/19/2024 156  0 - 200 mg/dL Final   Triglycerides 94/78/7974 99.0  0.0 - 149.0 mg/dL Final   HDL 94/78/7974 39.90  >39.00 mg/dL Final   VLDL 94/78/7974 19.8  0.0 - 40.0 mg/dL Final   LDL Cholesterol 04/19/2024 96  0 - 99 mg/dL Final   Total CHOL/HDL Ratio 04/19/2024 4   Final   NonHDL 04/19/2024 116.13    Final   Sodium 04/19/2024 140  135 - 145 mEq/L Final   Potassium 04/19/2024 4.4  3.5 - 5.1 mEq/L Final   Chloride 04/19/2024 106  96 - 112 mEq/L Final   CO2 04/19/2024 26  19 - 32 mEq/L Final   Glucose, Bld 04/19/2024 95  70 - 99 mg/dL Final   BUN 94/78/7974 17  6 - 23 mg/dL Final   Creatinine, Ser 04/19/2024 1.39  0.40 - 1.50 mg/dL Final   Total Bilirubin 04/19/2024 0.5  0.2 - 1.2 mg/dL Final   Alkaline Phosphatase 04/19/2024 90  39 - 117 U/L Final   AST 04/19/2024 18  0 - 37 U/L Final   ALT 04/19/2024 34  0 - 53 U/L Final   Total Protein 04/19/2024 6.6  6.0 - 8.3 g/dL Final   Albumin 94/78/7974 4.2  3.5 - 5.2 g/dL Final   GFR 94/78/7974 54.10 (L)  >60.00 mL/min Final   Calcium  04/19/2024 8.9  8.4 - 10.5 mg/dL Final   WBC 94/78/7974 8.4  4.0 - 10.5 K/uL Final   RBC 04/19/2024 5.86 (H)  4.22 - 5.81 Mil/uL Final   Hemoglobin 04/19/2024 15.2  13.0 - 17.0 g/dL Final   HCT 94/78/7974 47.6  39.0 - 52.0 % Final   MCV 04/19/2024 81.3  78.0 - 100.0 fl Final   MCHC 04/19/2024 31.9  30.0 - 36.0 g/dL Final   RDW 94/78/7974 15.6 (H)  11.5 - 15.5 % Final   Platelets 04/19/2024 265.0  150.0 - 400.0 K/uL Final   Neutrophils Relative % 04/19/2024 67.9  43.0 - 77.0 % Final   Lymphocytes Relative 04/19/2024 23.0  12.0 - 46.0 % Final   Monocytes Relative 04/19/2024 7.6  3.0 - 12.0 % Final   Eosinophils Relative 04/19/2024 0.9  0.0 - 5.0 % Final   Basophils Relative 04/19/2024 0.6  0.0 - 3.0 % Final   Neutro Abs 04/19/2024 5.7  1.4 - 7.7 K/uL Final   Lymphs Abs 04/19/2024 1.9  0.7 - 4.0 K/uL Final   Monocytes Absolute 04/19/2024 0.6  0.1 - 1.0 K/uL Final   Eosinophils Absolute 04/19/2024 0.1  0.0 - 0.7 K/uL Final   Basophils Absolute 04/19/2024 0.1  0.0 - 0.1 K/uL Final   TSH 04/19/2024 2.000  0.450 - 4.500 uIU/mL Final   PSA 04/19/2024 1.32  0.10 - 4.00 ng/mL Final   Hgb A1c MFr Bld 04/19/2024 6.1  4.6 - 6.5 % Final   VITD 04/19/2024 15.24 (L)  30.00 - 100.00 ng/mL Final   Color, Urine 04/19/2024  YELLOW  YELLOW Final   APPearance 04/19/2024 CLEAR  CLEAR Final   Specific Gravity, Urine 04/19/2024 1.024  1.001 - 1.035 Final   pH 04/19/2024 5.5  5.0 - 8.0 Final   Glucose, UA 04/19/2024 NEGATIVE  NEGATIVE Final   Bilirubin Urine 04/19/2024 NEGATIVE  NEGATIVE Final   Ketones,  ur 04/19/2024 NEGATIVE  NEGATIVE Final   Hgb urine dipstick 04/19/2024 NEGATIVE  NEGATIVE Final   Protein, ur 04/19/2024 NEGATIVE  NEGATIVE Final   Nitrites, Initial 04/19/2024 NEGATIVE  NEGATIVE Final   Leukocyte Esterase 04/19/2024 1+ (A)  NEGATIVE Final   WBC, UA 04/19/2024 6-10 (A)  0 - 5 /HPF Final   RBC / HPF 04/19/2024 NONE SEEN  0 - 2 /HPF Final   Squamous Epithelial / HPF 04/19/2024 0-5  < OR = 5 /HPF Final   Bacteria, UA 04/19/2024 NONE SEEN  NONE SEEN /HPF Final   Hyaline Cast 04/19/2024 NONE SEEN  NONE SEEN /LPF Final   Note 04/19/2024    Final   MICRO NUMBER: 04/19/2024 [id number]   Final   SPECIMEN QUALITY: 04/19/2024 Adequate   Final   Sample Source 04/19/2024 URINE   Final   STATUS: 04/19/2024 FINAL   Final   Result: 04/19/2024 No Growth   Final   REFLEXIVE URINE CULTURE 04/19/2024    Final  Clinical Support on 03/03/2024  Component Date Value Ref Range Status   Date Time Interrogation Session 03/08/2024 [id number]   Final   Pulse Generator Manufacturer 03/08/2024 SJCR   Final   Pulse Gen Model 03/08/2024 3357-40Q Unify Assura   Final   Pulse Gen Serial Number 03/08/2024 [id number]   Final   Clinic Name 03/08/2024 [facility]   Final   Implantable Pulse Generator Type 03/08/2024 Cardiac Resynch Therapy Defibulator   Final   Implantable Pulse Generator Implan* 03/08/2024 [id number]   Final   Implantable Lead Manufacturer 03/08/2024 OTHER   Final   Implantable Lead Model 03/08/2024 488787 Myopore   Final   Implantable Lead Serial Number 03/08/2024 [id number]   Final   Implantable Lead Implant Date 03/08/2024 [id number]   Final   Implantable Lead Location Detail 1 03/08/2024 UNKNOWN   Final    Implantable Lead Location 03/08/2024 246141   Final   Implantable Lead Connection Status 03/08/2024 246014   Final   Implantable Lead Manufacturer 03/08/2024 MERM   Final   Implantable Lead Model 03/08/2024 5076 CapSureFix Novus   Final   Implantable Lead Serial Number 03/08/2024 [id number]   Final   Implantable Lead Implant Date 03/08/2024 [id number]   Final   Implantable Lead Location Detail 1 03/08/2024 UNKNOWN   Final   Implantable Lead Location 03/08/2024 246140   Final   Implantable Lead Connection Status 03/08/2024 246014   Final   Implantable Lead Manufacturer 03/08/2024 MERM   Final   Implantable Lead Model 03/08/2024 6935M Sprint Quattro Secure S   Final   Implantable Lead Serial Number 03/08/2024 [id number]   Final   Implantable Lead Implant Date 03/08/2024 [id number]   Final   Implantable Lead Location Detail 1 03/08/2024 UNKNOWN   Final   Implantable Lead Location 03/08/2024 246139   Final   Implantable Lead Connection Status 03/08/2024 246014   Final   Lead Channel Setting Sensing Sensi* 03/08/2024 0.5  mV Final   Lead Channel Setting Sensing Adapt* 03/08/2024 Adaptive Sensing   Final   Lead Channel Setting Pacing Amplit* 03/08/2024 2.0  V Final   Lead Channel Setting Pacing Pulse * 03/08/2024 0.5  ms Final   Lead Channel Setting Pacing Amplit* 03/08/2024 2.0  V Final   Lead Channel Setting Pacing Pulse * 03/08/2024 0.5  ms Final   Lead Channel Setting Pacing Amplit* 03/08/2024 2.0  V Final   Lead Channel Setting Pacing Captur* 03/08/2024 Fixed Pacing   Final   Zone Setting  Status 03/08/2024 Active   Final   Zone Setting Status 03/08/2024 Inactive   Final   Zone Setting Status 03/08/2024 Active   Final   Lead Channel Status 03/08/2024 NULL   Final   Lead Channel Impedance Value 03/08/2024 360  ohm Final   Lead Channel Pacing Threshold Ampl* 03/08/2024 1.0  V Final   Lead Channel Pacing Threshold Puls* 03/08/2024 0.5  ms Final   Lead Channel Status 03/08/2024 NULL   Final    Lead Channel Impedance Value 03/08/2024 530  ohm Final   Lead Channel Sensing Intrinsic Amp* 03/08/2024 3.3  mV Final   Lead Channel Pacing Threshold Ampl* 03/08/2024 0.5  V Final   Lead Channel Pacing Threshold Puls* 03/08/2024 0.5  ms Final   Lead Channel Status 03/08/2024 NULL   Final   Lead Channel Impedance Value 03/08/2024 510  ohm Final   Lead Channel Sensing Intrinsic Amp* 03/08/2024 12.0  mV Final   Lead Channel Pacing Threshold Ampl* 03/08/2024 1.0  V Final   Lead Channel Pacing Threshold Puls* 03/08/2024 0.5  ms Final   HighPow Impedance 03/08/2024 68  ohm Final   HighPow Impedance 03/08/2024 68  ohm Final   HighPow Imped Status 03/08/2024 NULL   Final   HighPow Imped Status 03/08/2024 NULL   Final   Battery Status 03/08/2024 MOS   Final   Battery Remaining Longevity 03/08/2024 65  mo Final   Battery Remaining Percentage 03/08/2024 79.0  % Final   Battery Voltage 03/08/2024 3.01  V Final   Brady Statistic RA Percent Paced 03/08/2024 1.7  % Final   Brady Statistic AP VP Percent 03/08/2024 1.8  % Final   Brady Statistic AS VP Percent 03/08/2024 95.0  % Final   Brady Statistic AP VS Percent 03/08/2024 1.0  % Final   Brady Statistic AS VS Percent 03/08/2024 3.6  % Final  Hospital Outpatient Visit on 01/24/2024  Component Date Value Ref Range Status   S' Lateral 01/24/2024 3.70  cm Final   AV Area VTI 01/24/2024 2.81  cm2 Final   AV Mean grad 01/24/2024 2.0  mmHg Final   Single Plane A4C EF 01/24/2024 46.6  % Final   Single Plane A2C EF 01/24/2024 48.9  % Final   Calc EF 01/24/2024 45.6  % Final   AV Area mean vel 01/24/2024 3.07  cm2 Final   Area-P 1/2 01/24/2024 2.99  cm2 Final   AR max vel 01/24/2024 3.11  cm2 Final   AV Peak grad 01/24/2024 3.3  mmHg Final   Ao pk vel 01/24/2024 0.91  m/s Final   MV VTI 01/24/2024 2.62  cm2 Final   Est EF 01/24/2024 50 - 55%   Final  Office Visit on 12/15/2023  Component Date Value Ref Range Status   TSH 12/15/2023 2.41  0.40 - 4.50  mIU/L Final   Vitamin B-12 12/15/2023 273  200 - 1,100 pg/mL Final  Clinical Support on 12/03/2023  Component Date Value Ref Range Status   Date Time Interrogation Session 12/03/2023 [id number]   Final   Pulse Generator Manufacturer 12/03/2023 [facility]   Final   Pulse Gen Model 12/03/2023 3357-40Q [employer]   Final   Pulse Gen Serial Number 12/03/2023 [id number]   Final   Clinic Name 12/03/2023 [facility]   Final   Implantable Pulse Generator Type 12/03/2023 [facility] Defibulator   Final   Implantable Pulse Generator Implan* 12/03/2023 [id number]   Final   Implantable Lead Manufacturer 12/03/2023 OTHER  Final   Implantable Lead Model 12/03/2023 [id number] Myopore   Final   Implantable Lead Serial Number 12/03/2023 [id number]   Final   Implantable Lead Implant Date 12/03/2023 [id number]   Final   Implantable Lead Location Detail 1 12/03/2023 UNKNOWN   Final   Implantable Lead Location 12/03/2023 246141   Final   Implantable Lead Connection Status 12/03/2023 246014   Final   Implantable Lead Manufacturer 12/03/2023 MERM   Final   Implantable Lead Model 12/03/2023 5076 CapSureFix Novus   Final   Implantable Lead Serial Number 12/03/2023 [id number]   Final   Implantable Lead Implant Date 12/03/2023 [id number]   Final   Implantable Lead Location Detail 1 12/03/2023 UNKNOWN   Final   Implantable Lead Location 12/03/2023 246140   Final   Implantable Lead Connection Status 12/03/2023 246014   Final   Implantable Lead Manufacturer 12/03/2023 MERM   Final   Implantable Lead Model 12/03/2023 6935M Sprint Quattro Secure S   Final   Implantable Lead Serial Number 12/03/2023 [id number]   Final   Implantable Lead Implant Date 12/03/2023 [id number]   Final   Implantable Lead Location Detail 1 12/03/2023 UNKNOWN   Final   Implantable Lead Location 12/03/2023 246139   Final   Implantable Lead Connection Status 12/03/2023 246014   Final   Lead Channel Setting Sensing Sensi* 12/03/2023 0.5  mV Final    Lead Channel Setting Sensing Adapt* 12/03/2023 Adaptive Sensing   Final   Lead Channel Setting Pacing Amplit* 12/03/2023 2.0  V Final   Lead Channel Setting Pacing Pulse * 12/03/2023 0.5  ms Final   Lead Channel Setting Pacing Amplit* 12/03/2023 2.0  V Final   Lead Channel Setting Pacing Pulse * 12/03/2023 0.5  ms Final   Lead Channel Setting Pacing Amplit* 12/03/2023 2.0  V Final   Lead Channel Setting Pacing Captur* 12/03/2023 Fixed Pacing   Final   Zone Setting Status 12/03/2023 Active   Final   Zone Setting Status 12/03/2023 Inactive   Final   Zone Setting Status 12/03/2023 Active   Final   Lead Channel Status 12/03/2023 NULL   Final   Lead Channel Impedance Value 12/03/2023 390  ohm Final   Lead Channel Pacing Threshold Ampl* 12/03/2023 1.0  V Final   Lead Channel Pacing Threshold Puls* 12/03/2023 0.5  ms Final   Lead Channel Status 12/03/2023 NULL   Final   Lead Channel Impedance Value 12/03/2023 540  ohm Final   Lead Channel Sensing Intrinsic Amp* 12/03/2023 3.2  mV Final   Lead Channel Pacing Threshold Ampl* 12/03/2023 0.5  V Final   Lead Channel Pacing Threshold Puls* 12/03/2023 0.5  ms Final   Lead Channel Status 12/03/2023 NULL   Final   Lead Channel Impedance Value 12/03/2023 610  ohm Final   Lead Channel Sensing Intrinsic Amp* 12/03/2023 12.0  mV Final   Lead Channel Pacing Threshold Ampl* 12/03/2023 1.0  V Final   Lead Channel Pacing Threshold Puls* 12/03/2023 0.5  ms Final   HighPow Impedance 12/03/2023 75  ohm Final   HighPow Impedance 12/03/2023 75  ohm Final   HighPow Imped Status 12/03/2023 NULL   Final   HighPow Imped Status 12/03/2023 NULL   Final   Battery Status 12/03/2023 MOS   Final   Battery Remaining Longevity 12/03/2023 70  mo Final   Battery Remaining Percentage 12/03/2023 82.0  % Final   Battery Voltage 12/03/2023 3.02  V Final   Brady Statistic RA Percent Paced 12/03/2023 1.5  %  Final   Brady Statistic AP VP Percent 12/03/2023 1.6  % Final   Brady  Statistic AS VP Percent 12/03/2023 95.0  % Final   Brady Statistic AP VS Percent 12/03/2023 1.0  % Final   Brady Statistic AS VS Percent 12/03/2023 3.3  % Final  There may be more visits with results that are not included.  No image results found. CUP PACEART REMOTE DEVICE CHECK Result Date: 09/13/2024 ICD Scheduled remote reviewed. Normal device function.  Presenting rhythm:  AS/BiV pace Next remote transmission per protocol. LA, CVRS        ASSESSMENT & PLAN   Assessment & Plan Chronic systolic CHF (congestive heart failure) (HCC) Refilled spironolactone .   Upgraded home health see referral Pain in both lower extremities Chronic bilateral low back pain with bilateral sciatica Chronic back and lower extremity pain with likely sciatica   He experiences chronic back pain and numbness in the legs, likely due to sciatica, with pain localized to the hip area and not extending to the lower leg. Differential diagnosis includes neuralgia paresthetica. Pain worsens with standing, impacting daily activities like cooking and cleaning. Order x-rays of the back and hips to assess for structural abnormalities. Submit paperwork for home health extended hours to assist with daily activities. Request in-home physical therapy to address mobility issues. Screening for malignant neoplasm of colon Colorectal cancer screening   At 63 years old, he has no prior colonoscopy and a family history of colon cancer, with a brother affected. A full colonoscopy is recommended for comprehensive screening. Refer to GI for colonoscopy and instruct him to contact the office if not contacted by GI within a week regarding the referral. 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 The pain may be superficial or deep. Lidocaine  ointment is suggested to determine the nature of the pain, with caution advised due to  potential cardiac effects from excessive use. Prescribe lidocaine  ointment for application to the surgical site. Instruct to apply a small amount to avoid cardiac effects.   ORDER ASSOCIATIONS  #   DIAGNOSIS / CONDITION ICD-10 ENCOUNTER ORDER     ICD-10-CM   1. Screening for malignant neoplasm of colon  Z12.11 Ambulatory referral to Gastroenterology    2. Chronic systolic CHF (congestive heart failure) (HCC)  I50.22 spironolactone  (ALDACTONE ) 25 MG tablet    DG Lumbar Spine Complete    DG HIPS BILAT W OR W/O PELVIS MIN 5 VIEWS    3. Pain in both lower extremities  M79.604    M79.605     4. Pain in surgical scar  R52 lidocaine  (XYLOCAINE ) 5 % ointment   L90.5 Ambulatory referral to Home Health    5. Chronic bilateral low back pain with bilateral sciatica  M54.42    M54.41    G89.29      Meds ordered this encounter  Medications   spironolactone  (ALDACTONE ) 25 MG tablet    Sig: Take 1 tablet (25 mg total) by mouth daily.    Dispense:  90 tablet    Refill:  3   lidocaine  (XYLOCAINE ) 5 % ointment    Sig: Apply 1 Application topically as needed.    Dispense:  35.44 g    Refill:  0   Ambulatory referral to Gastroenterology       Comments: Never had colonoscopy-brother had colon cancer   Ambulatory referral to Home Health  Internal Referral, Routine, Home Health Services, Specialty Services Required Does the patient have Medicare or Medicaid? yes The encounter with the patient was in whole, or in part, for the following medical condition, which is the primary reason for home health care: back pain and chf Reason for Medically Necessary Home Health Services: Skilled Nursing- Change/Decline in Patient Status My clinical findings support the need for the above services: Pain interferes with ambulation/mobility, Shortness of breath with activity I certify that, based on my findings, the following services are medically necessary home health services: Nursing, Physical therapy  Further, I certify that my clinical findings support that this patient is homebound due to: Shortness of Breath with activity  I also wrote a letter is in the wrap-up and now have addended that on to assist with the community alternatives program for children and disabled adults referral request   This document was synthesized by artificial intelligence (Abridge) using HIPAA-compliant recording of the clinical interaction;   We discussed the use of AI scribe software for clinical note transcription with the patient, who gave verbal consent to proceed. additional Info: This encounter employed state-of-the-art, real-time, collaborative documentation. The patient actively reviewed and assisted in updating their electronic medical record on a shared screen, ensuring transparency and facilitating joint problem-solving for the problem list, overview, and plan. This approach promotes accurate, informed care. The treatment plan was discussed and reviewed in detail, including medication safety, potential side effects, and all patient questions. We confirmed understanding and comfort with the plan. Follow-up instructions were established, including contacting the office for any concerns, returning if symptoms worsen, persist, or new symptoms develop, and precautions for potential emergency department visits.

## 2024-09-25 NOTE — Assessment & Plan Note (Signed)
 Refilled spironolactone .   Upgraded home health see referral

## 2024-09-25 NOTE — Assessment & Plan Note (Signed)
 Chronic back and lower extremity pain with likely sciatica   He experiences chronic back pain and numbness in the legs, likely due to sciatica, with pain localized to the hip area and not extending to the lower leg. Differential diagnosis includes neuralgia paresthetica. Pain worsens with standing, impacting daily activities like cooking and cleaning. Order x-rays of the back and hips to assess for structural abnormalities. Submit paperwork for home health extended hours to assist with daily activities. Request in-home physical therapy to address mobility issues.

## 2024-09-25 NOTE — Patient Instructions (Signed)
 It was a pleasure seeing you today! Your health and satisfaction are our top priorities.  Bernardino Cone, MD  VISIT SUMMARY: During your visit, we discussed your chronic back and leg pain, soreness at your defibrillator surgical site, and the need for a colonoscopy due to your family history of colon cancer. We also addressed your request for home health services and your efforts to change your health insurance.  YOUR PLAN: -CHRONIC BACK AND LOWER EXTREMITY PAIN WITH LIKELY SCIATICA: Sciatica is a condition where pain radiates along the sciatic nerve, which runs down one or both legs from the lower back. We will order x-rays of your back and hips to check for any structural issues. Additionally, we will submit paperwork for extended home health services to help with daily activities and request in-home physical therapy to improve your mobility.  -CHRONIC PAIN AT DEFIBRILLATOR SURGICAL SITE: You have ongoing soreness at the site where your defibrillator was implanted. To help determine the nature of the pain, we recommend using lidocaine  ointment. Apply a small amount to the area to avoid any potential cardiac effects.  -COLORECTAL CANCER SCREENING: Given your age and family history of colon cancer, it is important to have a colonoscopy to screen for colorectal cancer. We will refer you to a gastroenterologist for this procedure. If you do not hear from their office within a week, please contact us .  INSTRUCTIONS: Please follow up with the x-rays of your back and hips as ordered. Use the lidocaine  ointment as directed for your defibrillator site pain. Expect a call from the gastroenterologist's office regarding your colonoscopy; if you do not hear from them within a week, contact our office. Additionally, we will submit paperwork for extended home health services and request in-home physical therapy for you.  Your Providers PCP: Cone Bernardino MATSU, MD,  [phone]) Referring Provider: Cone Bernardino MATSU, MD,  [phone]) Care Team Provider: Rolan Ezra RAMAN, MD,  [phone]) Care Team Provider: Shlomo Wilbert SAUNDERS, MD,  [phone]) Care Team Provider: Fernande Elspeth BROCKS, MD,  [phone]) Care Team Provider: Delores Emeline ORN, MD,  [phone])  NEXT STEPS: [x]  Early Intervention: Schedule sooner appointment, call our on-call services, or go to emergency room if there is any significant Increase in pain or discomfort New or worsening symptoms Sudden or severe changes in your health [x]  Flexible Follow-Up: We recommend a No follow-ups on file. for optimal routine care. This allows for progress monitoring and treatment adjustments. [x]  Preventive Care: Schedule your annual preventive care visit! It's typically covered by insurance and helps identify potential health issues early. [x]  Lab & X-ray Appointments: Incomplete tests scheduled today, or call to schedule. X-rays: [facility] (M-F, 8:30am-noon or 1pm-5pm). [x]  Medical Information Release: Sign a release form at front desk to obtain relevant medical information we don't have.  MAKING THE MOST OF OUR FOCUSED 20 MINUTE APPOINTMENTS: [x]   Clearly state your top concerns at the beginning of the visit to focus our discussion [x]   If you anticipate you will need more time, please inform the front desk during scheduling - we can book multiple appointments in the same week. [x]   If you have transportation problems- use our convenient video appointments or ask about transportation support. [x]   We can get down to business faster if you use MyChart to update information before the visit and submit non-urgent questions before your visit. Thank you for taking the time to provide details through MyChart.  Let our nurse know and she can import this  information into your encounter documents.  Arrival and Wait Times: [x]   Arriving on time ensures that everyone receives prompt attention. [x]   Early morning (8a) and afternoon (1p)  appointments tend to have shortest wait times. [x]   Unfortunately, we cannot delay appointments for late arrivals or hold slots during phone calls.  Getting Answers and Following Up [x]   Simple Questions & Concerns: For quick questions or basic follow-up after your visit, reach us  at [phone] or MyChart messaging. [x]   Complex Concerns: If your concern is more complex, scheduling an appointment might be best. Discuss this with the staff to find the most suitable option. [x]   Lab & Imaging Results: We'll contact you directly if results are abnormal or you don't use MyChart. Most normal results will be on MyChart within 2-3 business days, with a review message from Dr. Jesus. Haven't heard back in 2 weeks? Need results sooner? Contact us  at [phone]. [x]   Referrals: Our referral coordinator will manage specialist referrals. The specialist's office should contact you within 2 weeks to schedule an appointment. Call us  if you haven't heard from them after 2 weeks.  Staying Connected [x]   MyChart: Activate your MyChart for the fastest way to access results and message us . See the last page of this paperwork for instructions on how to activate.  Bring to Your Next Appointment [x]   Medications: Please bring all your medication bottles to your next appointment to ensure we have an accurate record of your prescriptions. [x]   Health Diaries: If you're monitoring any health conditions at home, keeping a diary of your readings can be very helpful for discussions at your next appointment.  Billing [x]   X-ray & Lab Orders: These are billed by separate companies. Contact the invoicing company directly for questions or concerns. [x]   Visit Charges: Discuss any billing inquiries with our administrative services team.  Your Satisfaction Matters [x]   Share Your Experience: We strive for your satisfaction! If you have any complaints, or preferably compliments, please let Dr. Jesus know directly or  contact our Practice Administrators, Manuelita Rubin or [employer], by asking at the front desk.   Reviewing Your Records [x]   Review this early draft of your clinical encounter notes below and the final encounter summary tomorrow on MyChart after its been completed.  All orders placed so far are visible here: Screening for malignant neoplasm of colon -     Ambulatory referral to Gastroenterology  Chronic systolic CHF (congestive heart failure) (HCC) -     Spironolactone ; Take 1 tablet (25 mg total) by mouth daily.  Dispense: 90 tablet; Refill: 3 -     DG Lumbar Spine Complete; Future -     DG HIPS BILAT W OR W/O PELVIS MIN 5 VIEWS; Future  Pain in both lower extremities  Pain in surgical scar -     Lidocaine ; Apply 1 Application topically as needed.  Dispense: 35.44 g; Refill: 0 -     Ambulatory referral to Home Health  Chronic bilateral low back pain with bilateral sciatica

## 2024-09-26 ENCOUNTER — Ambulatory Visit: Payer: Self-pay | Admitting: Cardiology

## 2024-09-28 ENCOUNTER — Ambulatory Visit: Payer: Self-pay | Admitting: Internal Medicine

## 2024-10-02 NOTE — Telephone Encounter (Signed)
 Spoke with pt via phone about results of xray pt states will bring paper work to fill out by provider. Pt understood results well

## 2024-10-09 DIAGNOSIS — R69 Illness, unspecified: Secondary | ICD-10-CM | POA: Diagnosis not present

## 2024-10-10 DIAGNOSIS — R69 Illness, unspecified: Secondary | ICD-10-CM | POA: Diagnosis not present

## 2024-10-11 DIAGNOSIS — Z0389 Encounter for observation for other suspected diseases and conditions ruled out: Secondary | ICD-10-CM | POA: Diagnosis not present

## 2024-10-11 DIAGNOSIS — R69 Illness, unspecified: Secondary | ICD-10-CM | POA: Diagnosis not present

## 2024-10-12 DIAGNOSIS — R69 Illness, unspecified: Secondary | ICD-10-CM | POA: Diagnosis not present

## 2024-10-13 DIAGNOSIS — R69 Illness, unspecified: Secondary | ICD-10-CM | POA: Diagnosis not present

## 2024-10-16 ENCOUNTER — Other Ambulatory Visit (HOSPITAL_COMMUNITY): Payer: Self-pay

## 2024-10-16 ENCOUNTER — Telehealth: Payer: Self-pay

## 2024-10-16 ENCOUNTER — Telehealth (HOSPITAL_COMMUNITY): Payer: Self-pay

## 2024-10-16 ENCOUNTER — Telehealth: Payer: Self-pay | Admitting: Internal Medicine

## 2024-10-16 NOTE — Telephone Encounter (Signed)
 Called and spoke with pt about paper work he drop off for home health provider will write note he is needing to go with it and then will fax over

## 2024-10-16 NOTE — Telephone Encounter (Signed)
 Type of form received: Community Alternatives Program  Additional comments: Pt wants the original once faxed  Received by: Etta  Form should be Faxed to: [phone]  Form should be mailed to:    Is patient requesting call for pickup:   Form placed:  In provider's box  Attach charge sheet. Y  Individual made aware of 3-5 business day turn around (Y/N)? Y

## 2024-10-16 NOTE — Telephone Encounter (Signed)
 Received on my desk waiting on provider to write letter to go with paper work for home health.

## 2024-10-16 NOTE — Telephone Encounter (Signed)
 [facility] Patient Advocate Encounter  Prior authorization for Jardiance  has been submitted and approved. Test billing returns $4 for 90 day supply.  Key: [id number] Effective: 10/16/2024 to 10/16/2025  Rachel DEL, CPhT Rx Patient Advocate Phone: [phone]

## 2024-10-17 DIAGNOSIS — R69 Illness, unspecified: Secondary | ICD-10-CM | POA: Diagnosis not present

## 2024-10-18 DIAGNOSIS — R69 Illness, unspecified: Secondary | ICD-10-CM | POA: Diagnosis not present

## 2024-10-19 DIAGNOSIS — R69 Illness, unspecified: Secondary | ICD-10-CM | POA: Diagnosis not present

## 2024-10-20 ENCOUNTER — Ambulatory Visit: Payer: MEDICAID | Admitting: Internal Medicine

## 2024-10-20 ENCOUNTER — Other Ambulatory Visit: Payer: Self-pay | Admitting: Internal Medicine

## 2024-10-20 ENCOUNTER — Other Ambulatory Visit (HOSPITAL_COMMUNITY): Payer: Self-pay | Admitting: Cardiology

## 2024-10-20 ENCOUNTER — Telehealth (HOSPITAL_COMMUNITY): Payer: Self-pay

## 2024-10-20 DIAGNOSIS — I5022 Chronic systolic (congestive) heart failure: Secondary | ICD-10-CM

## 2024-10-20 DIAGNOSIS — R69 Illness, unspecified: Secondary | ICD-10-CM | POA: Diagnosis not present

## 2024-10-20 MED ORDER — SACUBITRIL-VALSARTAN 97-103 MG PO TABS
1.0000 | ORAL_TABLET | Freq: Two times a day (BID) | ORAL | 1 refills | Status: DC
Start: 2024-10-20 — End: 2024-10-20

## 2024-10-20 NOTE — Telephone Encounter (Signed)
 Called to confirm/remind patient of their appointment at the [facility] on 10/23/2024.   Appointment:   [x] Confirmed  [] Left mess   [] No answer/No voice mail  [] VM Full/unable to leave message  [] Phone not in service  Patient reminded to bring all medications and/or complete list.  Confirmed patient has transportation. Gave directions, instructed to utilize valet parking.   Patient reports that he needs refill of Entresto - this has been sent in for patient.

## 2024-10-22 ENCOUNTER — Other Ambulatory Visit: Payer: Self-pay | Admitting: Internal Medicine

## 2024-10-22 DIAGNOSIS — N402 Nodular prostate without lower urinary tract symptoms: Secondary | ICD-10-CM

## 2024-10-23 ENCOUNTER — Encounter (HOSPITAL_COMMUNITY): Payer: MEDICAID

## 2024-10-23 DIAGNOSIS — R69 Illness, unspecified: Secondary | ICD-10-CM | POA: Diagnosis not present

## 2024-10-23 NOTE — Progress Notes (Incomplete)
 Patient ID: Roger Crosby, male   DOB: 1961/01/10, 63 y.o.   MRN: [id number]  PCP: Jesus Bernardino MATSU, MD Cardiology: Dr. Rolan  Chief complaint: CHF  63 y.o. with history of LBBB, nonischemic cardiomyopathy, severe OSA, HTN, CKD IIIa, GERD, hx substance abuse.  Echo in 2014 showed EF 20-25% with regional wall motion abnormalities.  LHC 9/14 showed no obstructive disease.  He has a nonischemic cardiomyopathy.  He has a [employer] CRT-D device.   He was lost to followup for about 2 years (was in prison).  He presented to [facility] ER in 03-19-24 with chest pain.  UDS was positive for cocaine.  He says that he messed up and was grieving for a death in the family.  Echo in 19-Mar-2024 showed EF 50-55% with mild MR.  Cardiolite  in Mar 19, 2024 showed EF 40% with no perfusion defect (low risk).    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  Echo in 8/20 showed EF 40-45%.    Echo 11/22: EF 40-45%, moderate LVH, diffuse hypokinesis, PASP 33, mild MR.    Echo was done today and reviewed, EF up to 45-50%, moderate LVH, normal RV size with mildly decreased systolic function, IVC normal.   Today he returns for HF follow up. Weight is down 12 lbs since last appointment.  He says he is not using cocaine, last positive UDS was in 2/25.  No chest pain or exertional dyspnea.  BP elevated, he says that he has taken all his medications except for Jardiance  which he has been out of for several months.  No lightheadedness, no palpitations.  No orthopnea/PND.   ECG (personally reviewed): NSR, BiV paced  St Jude device interrogation (personally reviewed): 96% BiV pacing, no VT/AF, thoracic impedance mildly decreased.   Labs (11/23): K 4.9, creatinine 1.56 Labs (5/25): K 4.4, creatinine 1.39, LDL 96, hgb 15.2, TSH normal  PMH: 1. Hypertension  2. GERD  3. Low back pain  4. LBBB: Newly noted 2011  5. Nonischemic cardiomyopathy: Echo (5/11): EF 45-50%,  mild hypokinesis of the apical anteroseptal wall, apical anterior wall, and true apex (LAD distribution), mild LAE.  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  Echo (6/14) with EF 20-25% with inferior/inferoseptal akinesis and hypokinesis elsewhere.  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  - St Jude CRT-D - Echo 2024-03-19) with EF 50-55%, mild MR. - Cardiolite  March 19, 2024) with EF 40%, no significant perfusion defect.  - LHC (8/20): anomalous LCx off the RCA, but no significant CAD.  - Echo (8/20): EF 40-45%.  - Echo (11/22): EF 40-45%, moderate LVH, diffuse hypokinesis, PASP 33 mmHg, mild MR.  - Echo (7/25): EF 45-50%, moderate LVH, normal RV size with mildly decreased systolic function, IVC normal.  6. CKD IIIa 7. OSA: Severe on sleep study.  8. Hx cocaine abuse  Family History:  Mother deceased CHF,DM,HTN  Father with heart trouble...died age 4.  1 sister Crohn's disease  1 sister healthy  1 brother died Prostate cancer/AIDS...was diagnosed with prostate cancer at age 63.  Father and mother both had MIs in their 63s   Social History:  Occupation:prior air traffic controller, does some landscaping now.   HS graduate.  Married  3 kids  Alcohol use-no. None since 2008  H/o cocaine use, last positive UDS in 2/25.  Prior smoker none since 2008.   Review of Systems  All systems  reviewed and negative except as per HPI.   Current Outpatient Medications  Medication Sig Dispense Refill   aspirin  EC 81 MG tablet Take 1 tablet (81 mg total) by mouth daily. 90 tablet 2   atorvastatin  (LIPITOR) 20 MG tablet Take 1 tablet every day by oral route at bedtime for 90 days. 90 tablet 3   carvedilol  (COREG ) 12.5 MG tablet TAKE 1 & 1/2 TABLETS (18.75 MG TOTAL) BY MOUTH 2 (TWO) TIMES DAILY. 270 tablet 4   carvedilol  (COREG ) 25 MG tablet Take 1 tablet (25 mg total) by mouth 2 (two) times daily. 180 tablet 3   empagliflozin  (JARDIANCE ) 10 MG TABS tablet Take  1 tablet (10 mg total) by mouth daily before breakfast. 90 tablet 3   ENTRESTO  97-103 MG TAKE 1 TABLET BY MOUTH TWICE A DAY 60 tablet 5   furosemide  (LASIX ) 20 MG tablet Take 1 tablet (20 mg total) by mouth daily. 90 tablet 3   lidocaine  (XYLOCAINE ) 5 % ointment Apply 1 Application topically as needed. 35.44 g 0   omeprazole  (PRILOSEC) 20 MG capsule Take 1 capsule (20 mg total) by mouth daily. 90 capsule 3   spironolactone  (ALDACTONE ) 25 MG tablet Take 1 tablet (25 mg total) by mouth daily. 90 tablet 3   tamsulosin  (FLOMAX ) 0.4 MG CAPS capsule TAKE 1 CAPSULE (0.4 MG TOTAL) BY MOUTH IN THE MORNING 90 capsule 2   Vitamin D , Ergocalciferol , (DRISDOL ) 1.25 MG (50000 UNIT) CAPS capsule Take 1 capsule (50,000 Units total) by mouth every 7 (seven) days. 12 capsule 1   No current facility-administered medications for this visit.   Wt Readings from Last 3 Encounters:  09/25/24 101.9 kg (224 lb 9.6 oz)  06/22/24 102.4 kg (225 lb 12.8 oz)  05/26/24 99.8 kg (220 lb)   There were no vitals taken for this visit. General: NAD Neck: No JVD, no thyromegaly or thyroid nodule.  Lungs: Clear to auscultation bilaterally with normal respiratory effort. CV: Nondisplaced PMI.  Heart regular S1/S2, no S3/S4, no murmur.  No peripheral edema.  No carotid bruit.  Normal pedal pulses.  Abdomen: Soft, nontender, no hepatosplenomegaly, no distention.  Skin: Intact without lesions or rashes.  Neurologic: Alert and oriented x 3.  Psych: Normal affect. Extremities: No clubbing or cyanosis.  HEENT: Normal.   Assessment/Plan: 1. Nonischemic cardiomyopathy: Possibly related to cocaine, also HTN may play a role.  No ETOH, reports no recent cocaine (last positive UDS in 2/25).  EF 20-25% with LBBB in 2014, had St Jude CRT-D device placed with improvement in LV function.  No significant CAD on cath, most recently in 8/20.  Echo (4/19) showed improvement in EF to 50-55%, and 8/20 echo showed EF back down a bit to 40-45%. Echo  11/22 showed stable EF 40-45%.  Echo today showed EF 45-50%.  NYHA class I symptoms.  Thoracic impedance trending down on device interrogation but he does not look volume overloaded on exam.  - Restart Jardiance  10 mg daily, this should help take off some fluid. BMET/BNP today.  - Increase Coreg  to 25 mg bid. - Continue Entresto  97/103 mg bid.   - Continue spironolactone  25 mg daily.   2. CKD: Stage 3. BMET today.  - Restart Jardiance .  3. HTN: BP Elevated.  - Increasing Coreg .  4. OSA: Suspected, arrange sleep study.  5. Cocaine abuse: Strongly urged him to avoid. Last positive UDS in 2/25.   Follow up 4 months with APP.   I spent 31 minutes reviewing records, interviewing/examining  patient, and managing orders.    Harlene HERO [facility]  10/23/2024

## 2024-10-24 ENCOUNTER — Other Ambulatory Visit: Payer: Self-pay | Admitting: Internal Medicine

## 2024-10-24 ENCOUNTER — Other Ambulatory Visit (HOSPITAL_COMMUNITY): Payer: Self-pay | Admitting: Cardiology

## 2024-10-24 DIAGNOSIS — I5022 Chronic systolic (congestive) heart failure: Secondary | ICD-10-CM

## 2024-10-24 DIAGNOSIS — K219 Gastro-esophageal reflux disease without esophagitis: Secondary | ICD-10-CM

## 2024-10-24 DIAGNOSIS — R69 Illness, unspecified: Secondary | ICD-10-CM | POA: Diagnosis not present

## 2024-10-24 MED ORDER — SACUBITRIL-VALSARTAN 97-103 MG PO TABS: 1.0000 | ORAL_TABLET | Freq: Two times a day (BID) | ORAL | 1 refills | Status: AC

## 2024-10-24 NOTE — Telephone Encounter (Signed)
 OPENED IN ERROR

## 2024-10-24 NOTE — Addendum Note (Signed)
 Addended by: Cezar Misiaszek, DALTON HERO on: 10/24/2024 03:26 PM   Modules accepted: Orders

## 2024-10-25 DIAGNOSIS — R69 Illness, unspecified: Secondary | ICD-10-CM | POA: Diagnosis not present

## 2024-10-26 DIAGNOSIS — R69 Illness, unspecified: Secondary | ICD-10-CM | POA: Diagnosis not present

## 2024-10-27 DIAGNOSIS — R69 Illness, unspecified: Secondary | ICD-10-CM | POA: Diagnosis not present

## 2024-10-30 DIAGNOSIS — R69 Illness, unspecified: Secondary | ICD-10-CM | POA: Diagnosis not present

## 2024-10-31 DIAGNOSIS — R69 Illness, unspecified: Secondary | ICD-10-CM | POA: Diagnosis not present

## 2024-11-01 DIAGNOSIS — R69 Illness, unspecified: Secondary | ICD-10-CM | POA: Diagnosis not present

## 2024-11-02 ENCOUNTER — Telehealth: Payer: Self-pay | Admitting: Internal Medicine

## 2024-11-02 ENCOUNTER — Ambulatory Visit: Payer: Medicaid Other

## 2024-11-02 DIAGNOSIS — R69 Illness, unspecified: Secondary | ICD-10-CM | POA: Diagnosis not present

## 2024-11-02 NOTE — Telephone Encounter (Signed)
 [employer] LTSS 3051 form, to be filled out by provider. Patient requested to send it back via Fax within ASAP. Document is located in providers tray at front office.Please advise at [phone].

## 2024-11-03 ENCOUNTER — Telehealth (HOSPITAL_COMMUNITY): Payer: Self-pay

## 2024-11-03 DIAGNOSIS — R69 Illness, unspecified: Secondary | ICD-10-CM | POA: Diagnosis not present

## 2024-11-03 NOTE — Telephone Encounter (Signed)
 Placed on provider desk to be sign and done ma will fill out what she can

## 2024-11-03 NOTE — Telephone Encounter (Signed)
 Called to confirm/remind patient of their appointment [facility] on 11/06/24 10:30.   Appointment:   [] Confirmed  [] Left mess   [] No answer/No voice mail  [x] VM Full/unable to leave message  [] Phone not in service  Patient reminded to bring all medications and/or complete list.  Confirmed patient has transportation. Gave directions, instructed to utilize valet parking.

## 2024-11-06 ENCOUNTER — Ambulatory Visit (HOSPITAL_COMMUNITY): Payer: Self-pay

## 2024-11-06 DIAGNOSIS — R69 Illness, unspecified: Secondary | ICD-10-CM | POA: Diagnosis not present

## 2024-11-06 NOTE — Progress Notes (Incomplete)
 Patient ID: Roger Crosby, male   DOB: 15-Aug-1961, 63 y.o.   MRN: [id number]  PCP: Jesus Bernardino MATSU, MD Cardiology: Dr. Rolan  Chief complaint: CHF  63 y.o. with history of LBBB, nonischemic cardiomyopathy, severe OSA, HTN, CKD IIIa, GERD, hx substance abuse.  Echo in 2014 showed EF 20-25% with regional wall motion abnormalities.  LHC 9/14 showed no obstructive disease.  He has a nonischemic cardiomyopathy.  He has a [employer] CRT-D device.   He was lost to followup for about 2 years (was in prison).  He presented to [facility] ER in 04-10-2024 with chest pain.  UDS was positive for cocaine.  He says that he messed up and was grieving for a death in the family.  Echo in 04-10-2024 showed EF 50-55% with mild MR.  Cardiolite  in Apr 10, 2024 showed EF 40% with no perfusion defect (low risk).    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  Echo in 8/20 showed EF 40-45%.    Echo 11/22: EF 40-45%, moderate LVH, diffuse hypokinesis, PASP 33, mild MR.    Echo 7/25: EF up to 45-50%, moderate LVH, normal RV size with mildly decreased systolic function, IVC normal.     ECG (personally reviewed): NSR, BiV paced  St Jude device interrogation (personally reviewed): 96% BiV pacing, no VT/AF, thoracic impedance mildly decreased.   Labs (11/23): K 4.9, creatinine 1.56 Labs (5/25): K 4.4, creatinine 1.39, LDL 96, hgb 15.2, TSH normal  PMH: 1. Hypertension  2. GERD  3. Low back pain  4. LBBB: Newly noted 2011  5. Nonischemic cardiomyopathy: Echo (5/11): EF 45-50%, mild hypokinesis of the apical anteroseptal wall, apical anterior wall, and true apex (LAD distribution), mild LAE.  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  Echo (6/14) with EF 20-25% with inferior/inferoseptal akinesis and hypokinesis elsewhere.  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  - St Jude CRT-D - Echo 04-10-24) with EF 50-55%, mild  MR. - Cardiolite  04/10/2024) with EF 40%, no significant perfusion defect.  - LHC (8/20): anomalous LCx off the RCA, but no significant CAD.  - Echo (8/20): EF 40-45%.  - Echo (11/22): EF 40-45%, moderate LVH, diffuse hypokinesis, PASP 33 mmHg, mild MR.  - Echo (7/25): EF 45-50%, moderate LVH, normal RV size with mildly decreased systolic function, IVC normal.  6. CKD IIIa 7. OSA: Severe on sleep study.  8. Hx cocaine abuse  Family History:  Mother deceased CHF,DM,HTN  Father with heart trouble...died age 31.  1 sister Crohn's disease  1 sister healthy  1 brother died Prostate cancer/AIDS...was diagnosed with prostate cancer at age 52.  Father and mother both had MIs in their 40s   Social History:  Occupation:prior air traffic controller, does some landscaping now.   HS graduate.  Married  3 kids  Alcohol use-no. None since 2008  H/o cocaine use, last positive UDS in 2/25.  Prior smoker none since 2008.   Review of Systems  All systems reviewed and negative except as per HPI.   Current Outpatient Medications  Medication Sig Dispense Refill   aspirin  EC 81 MG tablet Take 1 tablet (81 mg total) by mouth daily. 90 tablet 2   atorvastatin  (LIPITOR) 20 MG tablet Take 1 tablet every day by oral route at bedtime for 90 days. 90 tablet 3   carvedilol  (COREG ) 12.5 MG tablet TAKE 1 & 1/2 TABLETS (18.75  MG TOTAL) BY MOUTH 2 (TWO) TIMES DAILY. 270 tablet 4   carvedilol  (COREG ) 25 MG tablet Take 1 tablet (25 mg total) by mouth 2 (two) times daily. 180 tablet 3   empagliflozin  (JARDIANCE ) 10 MG TABS tablet Take 1 tablet (10 mg total) by mouth daily before breakfast. 90 tablet 3   ENTRESTO  97-103 MG TAKE 1 TABLET BY MOUTH TWICE A DAY 60 tablet 5   furosemide  (LASIX ) 20 MG tablet Take 1 tablet (20 mg total) by mouth daily. 90 tablet 3   lidocaine  (XYLOCAINE ) 5 % ointment Apply 1 Application topically as needed. 35.44 g 0   omeprazole  (PRILOSEC) 20 MG capsule TAKE 1 CAPSULE BY MOUTH EVERY DAY  90 capsule 1   sacubitril -valsartan  (ENTRESTO ) 97-103 MG Take 1 tablet by mouth 2 (two) times daily. 180 tablet 1   spironolactone  (ALDACTONE ) 25 MG tablet Take 1 tablet (25 mg total) by mouth daily. 90 tablet 3   tamsulosin  (FLOMAX ) 0.4 MG CAPS capsule TAKE 1 CAPSULE (0.4 MG TOTAL) BY MOUTH IN THE MORNING 90 capsule 2   Vitamin D , Ergocalciferol , (DRISDOL ) 1.25 MG (50000 UNIT) CAPS capsule Take 1 capsule (50,000 Units total) by mouth every 7 (seven) days. 12 capsule 1   No current facility-administered medications for this visit.   Wt Readings from Last 3 Encounters:  09/25/24 101.9 kg (224 lb 9.6 oz)  06/22/24 102.4 kg (225 lb 12.8 oz)  05/26/24 99.8 kg (220 lb)   There were no vitals taken for this visit. General: NAD Neck: No JVD, no thyromegaly or thyroid nodule.  Lungs: Clear to auscultation bilaterally with normal respiratory effort. CV: Nondisplaced PMI.  Heart regular S1/S2, no S3/S4, no murmur.  No peripheral edema.  No carotid bruit.  Normal pedal pulses.  Abdomen: Soft, nontender, no hepatosplenomegaly, no distention.  Skin: Intact without lesions or rashes.  Neurologic: Alert and oriented x 3.  Psych: Normal affect. Extremities: No clubbing or cyanosis.  HEENT: Normal.   Assessment/Plan: 1. Nonischemic cardiomyopathy: Possibly related to cocaine, also HTN may play a role.  No ETOH, reports no recent cocaine (last positive UDS in 2/25).  EF 20-25% with LBBB in 2014, had St Jude CRT-D device placed with improvement in LV function.  No significant CAD on cath, most recently in 8/20.  Echo (4/19) showed improvement in EF to 50-55%, and 8/20 echo showed EF back down a bit to 40-45%. Echo 11/22 showed stable EF 40-45%.  Echo today showed EF 45-50%.  NYHA ***. Volume ***.  - Restart Jardiance  10 mg daily, this should help take off some fluid. BMET/BNP today.  - Increase Coreg  to 25 mg bid. - Continue Entresto  97/103 mg bid.   - Continue spironolactone  25 mg daily.   2. CKD: Stage  3. BMET today.  - Restart Jardiance .  3. HTN: *** 4. OSA: Suspected, arrange sleep study.  5. Cocaine abuse: Strongly urged him to avoid. Last positive UDS in 2/25.   Follow up  Manuelita Dutch, PA-C 11/06/24

## 2024-11-07 DIAGNOSIS — R69 Illness, unspecified: Secondary | ICD-10-CM | POA: Diagnosis not present

## 2024-11-08 ENCOUNTER — Ambulatory Visit (HOSPITAL_BASED_OUTPATIENT_CLINIC_OR_DEPARTMENT_OTHER): Payer: MEDICAID | Admitting: Cardiology

## 2024-11-08 DIAGNOSIS — R69 Illness, unspecified: Secondary | ICD-10-CM | POA: Diagnosis not present

## 2024-11-09 ENCOUNTER — Telehealth (HOSPITAL_COMMUNITY): Payer: Self-pay

## 2024-11-09 ENCOUNTER — Telehealth: Payer: Self-pay

## 2024-11-09 DIAGNOSIS — R69 Illness, unspecified: Secondary | ICD-10-CM | POA: Diagnosis not present

## 2024-11-09 NOTE — Telephone Encounter (Signed)
 Called and spoke with pt have new paper work filled out from insurance pt needs appt ok for vv per provider will send message up front to make appt for pt

## 2024-11-09 NOTE — Telephone Encounter (Signed)
 Called to confirm/remind patient of their appointment at [facility] on 11/10/24 10:30.   Appointment:   [x] Confirmed  [] Left mess   [] No answer/No voice mail  [] VM Full/unable to leave message  [] Phone not in service  Patient reminded to bring all medications and/or complete list.  Confirmed patient has transportation. Gave directions, instructed to utilize valet parking.

## 2024-11-09 NOTE — Telephone Encounter (Signed)
 Copied from CRM #[id number]. Topic: General - Other >> Nov 09, 2024  8:44 AM Rosina BIRCH wrote: Reason for CRM: Alfonso from [employer] called wanting to know if  we received a fax on last week for the patient 12/4.  Alfonso will refax

## 2024-11-10 ENCOUNTER — Ambulatory Visit (HOSPITAL_COMMUNITY): Payer: Self-pay

## 2024-11-10 ENCOUNTER — Telehealth: Admitting: Internal Medicine

## 2024-11-10 ENCOUNTER — Encounter: Payer: Self-pay | Admitting: Internal Medicine

## 2024-11-10 DIAGNOSIS — Z789 Other specified health status: Secondary | ICD-10-CM

## 2024-11-10 DIAGNOSIS — I5022 Chronic systolic (congestive) heart failure: Secondary | ICD-10-CM | POA: Diagnosis not present

## 2024-11-10 DIAGNOSIS — G8929 Other chronic pain: Secondary | ICD-10-CM

## 2024-11-10 DIAGNOSIS — I428 Other cardiomyopathies: Secondary | ICD-10-CM

## 2024-11-10 DIAGNOSIS — M5442 Lumbago with sciatica, left side: Secondary | ICD-10-CM

## 2024-11-10 DIAGNOSIS — M5441 Lumbago with sciatica, right side: Secondary | ICD-10-CM

## 2024-11-10 DIAGNOSIS — Z741 Need for assistance with personal care: Secondary | ICD-10-CM

## 2024-11-10 DIAGNOSIS — R5381 Other malaise: Secondary | ICD-10-CM

## 2024-11-10 DIAGNOSIS — M5459 Other low back pain: Secondary | ICD-10-CM

## 2024-11-10 DIAGNOSIS — Y93G3 Activity, cooking and baking: Secondary | ICD-10-CM

## 2024-11-10 DIAGNOSIS — R69 Illness, unspecified: Secondary | ICD-10-CM | POA: Diagnosis not present

## 2024-11-10 MED ORDER — ATORVASTATIN CALCIUM 20 MG PO TABS: ORAL_TABLET | ORAL | 3 refills | Status: AC

## 2024-11-10 NOTE — Progress Notes (Addendum)
 ====================================  [facility]: [phone]   --  Virtual Video Medical Office Visit --  Patient: Roger Crosby      Age: 63 y.o.       Sex:  male  Date:   11/10/2024 Today's Healthcare Provider: Bernardino KANDICE Cone, MD  ====================================    Chief Complaint/Reason For Visit: Paperwork For personal care assistant for home care- not able to do Activities of Daily Living (ADLs) due to low back pain.    ICD-10-CM   1. Intractable low back pain  M54.59 Ambulatory referral to Home Health    Ambulatory referral to Pain Clinic    CT Lumbar Spine Wo Contrast    2. Dependent for bathing  Z74.1     3. Activity, cooking and baking  Y93.G3     4. Difficulty cleaning food preparation area  Z78.9     5. Activities of daily living deficit involving total body bathing  Z78.9     6. Activities of daily living deficit involving grooming and hygiene  Z78.9     7. Deficit in activities of daily living (ADL)  Z78.9     8. Chronic bilateral low back pain with bilateral sciatica  M54.42    M54.41    G89.29     9. Chronic systolic CHF (congestive heart failure) (HCC)  I50.22 atorvastatin  (LIPITOR) 20 MG tablet    10. NICM (nonischemic cardiomyopathy) (HCC)  I42.8     11. Physical deconditioning  R53.81      Chart reviewed: has Hyperlipidemia; ERECTILE DYSFUNCTION; Hypertension; LBBB (left bundle branch block); GERD; Nodular prostate without urinary obstruction; Hypertrophic and atrophic condition of skin; LOW BACK PAIN; LEG PAIN; Chronic systolic CHF (congestive heart failure) (HCC); NICM (nonischemic cardiomyopathy) (HCC); CKD (chronic kidney disease) stage 3, GFR 30-59 ml/min (HCC); Obesity (BMI 30.0-34.9); Class 2 obesity due to excess calories without serious comorbidity with body mass index (BMI) of 35.0 to 35.9 in adult; Polyuria; AICD (automatic cardioverter/defibrillator) present; Erythrocytosis; OSA (obstructive  sleep apnea); Prediabetes; Mood disorder; Laceration of left median nerve; Heart failure (HCC); Benign prostatic hyperplasia with urinary obstruction; Former smoker; Not currently working due to disabled status; Vitamin D  deficiency; Impacted third molar tooth; Urinary frequency; Hematuria; Low vitamin D  level; Chronic intractable headache; and Abnormal urinalysis on their problem list. Chart reviewed:  has a past medical history of AICD (automatic cardioverter/defibrillator) present, Atypical chest pain (04/07/2016), CAD (coronary artery disease), Chest pain (03/11/2018), CHF (congestive heart failure) (HCC), CKD (chronic kidney disease), Cocaine use (07/04/2019), Dribbling of urine (12/18/2022), DYSPNEA (04/04/2010), Elevated red blood cell count (12/21/2022), Elevated troponin (07/04/2019), GERD (gastroesophageal reflux disease), High cholesterol, History of drug abuse (HCC) (12/18/2022), HTN (hypertension), echocardiogram, LBBB (left bundle branch block), LBP (low back pain), NICM (nonischemic cardiomyopathy) (HCC), Normocytic hypochromic anemia (12/21/2022), S/P ICD (internal cardiac defibrillator) procedure (02/07/2015), Substance abuse (HCC) (04/08/2016), Systolic heart failure (HCC), and Tobacco dependence (07/26/2020). Discussed the use of AI scribe software for clinical note transcription with the patient, who gave verbal consent to proceed. History of Present Illness Is a pleasant 63 year old male who presents to discuss increasing home care need and request assistance completing paperwork for a personal care assistant.  He has had deterioration of his ability to complete activities of daily living primarily due to severe back pain making it impossible for him to stand still for more than just a couple seconds.  He can walk a short distance but if he stops to stand it gets too painful as  a result he cannot bathe or cook meals for himself and he is asking for assistance for these.  We had evaluated him  for this earlier this year and x-rays came back showing arthritis but he has not done physical therapy yet.  His situation is complicated by congestive heart failure with an AICD so that he cannot complete an MRI for his low back.   Medications reviewed: Current Outpatient Medications on File Prior to Visit  Medication Sig   aspirin  EC 81 MG tablet Take 1 tablet (81 mg total) by mouth daily.   carvedilol  (COREG ) 12.5 MG tablet TAKE 1 & 1/2 TABLETS (18.75 MG TOTAL) BY MOUTH 2 (TWO) TIMES DAILY.   carvedilol  (COREG ) 25 MG tablet Take 1 tablet (25 mg total) by mouth 2 (two) times daily.   empagliflozin  (JARDIANCE ) 10 MG TABS tablet Take 1 tablet (10 mg total) by mouth daily before breakfast.   ENTRESTO  97-103 MG TAKE 1 TABLET BY MOUTH TWICE A DAY   furosemide  (LASIX ) 20 MG tablet Take 1 tablet (20 mg total) by mouth daily.   lidocaine  (XYLOCAINE ) 5 % ointment Apply 1 Application topically as needed.   omeprazole  (PRILOSEC) 20 MG capsule TAKE 1 CAPSULE BY MOUTH EVERY DAY   sacubitril -valsartan  (ENTRESTO ) 97-103 MG Take 1 tablet by mouth 2 (two) times daily.   spironolactone  (ALDACTONE ) 25 MG tablet Take 1 tablet (25 mg total) by mouth daily.   tamsulosin  (FLOMAX ) 0.4 MG CAPS capsule TAKE 1 CAPSULE (0.4 MG TOTAL) BY MOUTH IN THE MORNING   Vitamin D , Ergocalciferol , (DRISDOL ) 1.25 MG (50000 UNIT) CAPS capsule Take 1 capsule (50,000 Units total) by mouth every 7 (seven) days.   No current facility-administered medications on file prior to visit.   Medications Discontinued During This Encounter  Medication Reason   atorvastatin  (LIPITOR) 20 MG tablet Reorder       Virtual Physical Exam: General Appearance:  Well Developed, Well Nourished, No Acute Distress by Limited Video Assessment Pulmonary:  No Respiratory Distress Apparent. Normal Work of Breathing.   Neurological:  Awake, Alert. No Obvious Focal Neurological Deficits or Cognitive Impairments.  Sensorium Seems Unclouded. Psychiatric:   Appropriate Mood, Pleasant Demeanor, Calm, Articulate, Good Mood  DG Lumbar Spine Complete Result Date: 09/26/2024 EXAM: 4 OR MORE VIEW(S) XRAY OF THE LUMBAR SPINE 09/25/2024 09:43:06 AM COMPARISON: Comparison study 08/21/2014. CLINICAL HISTORY: back pain leg pain. Lower back pain: NKI FINDINGS: LUMBAR SPINE: BONES: No acute fracture. No aggressive appearing osseous lesion. Alignment is normal. DISCS AND DEGENERATIVE CHANGES: Mild degenerative changes at each lumbar spine level with most severe findings at L4-L5 and L5-S1 with osteophytes and disc space narrowing. Facet joint degenerative changes L4-L5 and L5-S1. SOFT TISSUES: No acute abnormality. IMPRESSION: 1. No acute abnormality of the lumbar spine. 2. Degenerative changes. Electronically signed by: Fonda Field MD 09/26/2024 08:29 PM EDT RP Workstation: [id number]   DG HIPS BILAT W OR W/O PELVIS MIN 5 VIEWS Result Date: 09/26/2024 EXAM: 5 OR MORE VIEW(S) XRAY OF THE BILATERAL HIP 09/25/2024 09:45:28 AM COMPARISON: None available. CLINICAL HISTORY: bilateral hip pain. Pain; NKI FINDINGS: BONES AND JOINTS: No acute fracture or focal osseous lesion. The hip joint is maintained. Mild bilateral hip osteoarthritis. Bilateral SI joint osteoarthritis. Mild pubic symphysis degenerative change. SOFT TISSUES: The soft tissues are unremarkable. IMPRESSION: 1. No acute findings. 2. Mild bilateral hip osteoarthritis. 3. Bilateral sacroiliac joint osteoarthritis. 4. Mild pubic symphysis degenerative change. Electronically signed by: Norleen Kil MD 09/26/2024 07:05 AM EDT RP Workstation: [id number]  CUP PACEART REMOTE DEVICE CHECK Result Date: 09/13/2024 ICD Scheduled remote reviewed. Normal device function.  Presenting rhythm:  AS/BiV pace Next remote transmission per protocol. LA, CVRS ECHOCARDIOGRAM COMPLETE Result Date: 06/22/2024    ECHOCARDIOGRAM REPORT   Patient Name:   TRAEVION POEHLER Date of Exam: 06/22/2024 Medical Rec #:  [id number]     Height:        68.0 in Accession #:    [id number]    Weight:       220.0 lb Date of Birth:  07-05-1961      BSA:          2.128 m Patient Age:    63 years      BP:           136/86 mmHg Patient Gender: M             HR:           71 bpm. Exam Location:  Outpatient Procedure: 2D Echo, Cardiac Doppler and Color Doppler (Both Spectral and Color            Flow Doppler were utilized during procedure). Indications:     Congestive Heart Failure I50.9  History:         Patient has prior history of Echocardiogram examinations, most                  recent 01/24/2024.  Sonographer:     Tinnie Gosling RDCS Referring Phys:  [id number] GORMAN SHUCK Diagnosing Phys: Wilbert Bihari MD IMPRESSIONS  1. Left ventricular ejection fraction, by estimation, is 50 to 55%. The left ventricle has low normal function. Left ventricular endocardial border not optimally defined to evaluate regional wall motion. There is moderate concentric left ventricular hypertrophy. Left ventricular diastolic parameters are consistent with Grade I diastolic dysfunction (impaired relaxation).  2. Right ventricular systolic function is normal. The right ventricular size is normal. Tricuspid regurgitation signal is inadequate for assessing PA pressure.  3. The mitral valve is normal in structure. No evidence of mitral valve regurgitation. No evidence of mitral stenosis.  4. The aortic valve is normal in structure. Aortic valve regurgitation is not visualized. No aortic stenosis is present.  5. The inferior vena cava is normal in size with greater than 50% respiratory variability, suggesting right atrial pressure of 3 mmHg. FINDINGS  Left Ventricle: Left ventricular ejection fraction, by estimation, is 50 to 55%. The left ventricle has low normal function. Left ventricular endocardial border not optimally defined to evaluate regional wall motion. The left ventricular internal cavity  size was normal in size. There is moderate concentric left ventricular hypertrophy. Left ventricular  diastolic parameters are consistent with Grade I diastolic dysfunction (impaired relaxation). Normal left ventricular filling pressure. Right Ventricle: The right ventricular size is normal. No increase in right ventricular wall thickness. Right ventricular systolic function is normal. Tricuspid regurgitation signal is inadequate for assessing PA pressure. Left Atrium: Left atrial size was normal in size. Right Atrium: Right atrial size was normal in size. Pericardium: There is no evidence of pericardial effusion. Mitral Valve: The mitral valve is normal in structure. No evidence of mitral valve regurgitation. No evidence of mitral valve stenosis. Tricuspid Valve: The tricuspid valve is normal in structure. Tricuspid valve regurgitation is not demonstrated. No evidence of tricuspid stenosis. Aortic Valve: The aortic valve is normal in structure. Aortic valve regurgitation is not visualized. No aortic stenosis is present. Pulmonic Valve: The pulmonic valve was normal in structure. Pulmonic valve regurgitation is  not visualized. No evidence of pulmonic stenosis. Aorta: The aortic root is normal in size and structure. Venous: The inferior vena cava is normal in size with greater than 50% respiratory variability, suggesting right atrial pressure of 3 mmHg. IAS/Shunts: No atrial level shunt detected by color flow Doppler. Additional Comments: A device lead is visualized.  LEFT VENTRICLE PLAX 2D LVIDd:         4.70 cm   Diastology LVIDs:         3.50 cm   LV e' medial:    4.03 cm/s LV PW:         1.40 cm   LV E/e' medial:  10.6 LV IVS:        1.30 cm   LV e' lateral:   7.29 cm/s LVOT diam:     2.20 cm   LV E/e' lateral: 5.9 LV SV:         46 LV SV Index:   22 LVOT Area:     3.80 cm  RIGHT VENTRICLE            IVC RV S prime:     8.49 cm/s  IVC diam: 1.40 cm TAPSE (M-mode): 2.2 cm LEFT ATRIUM             Index        RIGHT ATRIUM           Index LA diam:        4.10 cm 1.93 cm/m   RA Area:     13.50 cm LA Vol (A2C):    37.0 ml 17.39 ml/m  RA Volume:   28.20 ml  13.25 ml/m LA Vol (A4C):   35.3 ml 16.59 ml/m LA Biplane Vol: 38.0 ml 17.86 ml/m  AORTIC VALVE LVOT Vmax:   68.50 cm/s LVOT Vmean:  44.000 cm/s LVOT VTI:    0.122 m  AORTA Ao Root diam: 3.00 cm Ao Asc diam:  3.50 cm MITRAL VALVE MV Area (PHT): 4.15 cm    SHUNTS MV Decel Time: 183 msec    Systemic VTI:  0.12 m MV E velocity: 42.80 cm/s  Systemic Diam: 2.20 cm MV A velocity: 59.10 cm/s MV E/A ratio:  0.72 Wilbert Bihari MD Electronically signed by Wilbert Bihari MD Signature Date/Time: 06/22/2024/12:35:08 PM    Final (Updated)         ASSESSMENT & PLAN   Assessment & Plan Dependent for bathing Activity, cooking and baking Difficulty cleaning food preparation area Activities of daily living deficit involving total body bathing Activities of daily living deficit involving grooming and hygiene Deficit in activities of daily living (ADL) Chronic bilateral low back pain with bilateral sciatica Physical deconditioning Chronic systolic CHF (congestive heart failure) (HCC) NICM (nonischemic cardiomyopathy) (HCC) ADDENDUM TO ENCOUNTER (addended 11/23/24)      Purpose of Addendum: To reinforce and document medical necessity for home health and personal care services as committed in prior patient communication, and to provide additional resources given patient's difficulty accessing services.   FUNCTIONAL STATUS ASSESSMENT  Functional Domain Current Status  Ambulation Unable to stand more than a few seconds due to pain; severe claudication limits all mobility  Bathing Dependent -- requires assistance due to inability to safely stand or transfer  Meal Preparation Dependent -- cannot stand long enough to cook; unable to safely use stove  Housekeeping Dependent -- cannot perform cleaning tasks due to pain and mobility limitations  Personal Care Requires assistance with grooming and hygiene tasks   IMAGING CORRELATION  CT Lumbar Spine and Bilateral Hip  X-rays demonstrate: Multilevel lumbar spondylosis with degenerative disc disease at L4-L5 and L5-S1 Facet arthropathy contributing to neural foraminal stenosis Degenerative changes of bilateral sacroiliac joints Bilateral hip osteoarthritis Clinical Correlation: Imaging findings are consistent with patient's presentation of chronic low back pain with bilateral lower extremity radiculopathy and claudication, explaining his severe functional impairment.   BARRIER TO CARE      Access Issue: Patient and family report difficulty finding a home health agency able to provide the necessary level of support given the severity of his functional limitations. Prior referral has not resulted in adequate services being established.   PLAN  # Action Item  1 Home Health Referral Reinforced: Updated referral documentation to specify dependence for ADLs (bathing, cooking, cleaning, personal care) due to severe chronic pain, limited mobility, and claudication. Medical necessity clearly established.  2 Pain Management: Continue to pursue pain management evaluation for comprehensive assessment and potential interventional options.  3 Physical Therapy: In-home physical therapy recommended given patient's inability to travel safely to outpatient facility.  4 Alternative Resources Provided: Patient advised to contact [place] [employer], artist, and Medicaid waiver programs (if eligible) for additional support options if standard home health agencies unable to meet needs.  5 Safety Monitoring: Patient educated on red flag symptoms requiring emergent evaluation: new or progressive weakness, bowel/bladder dysfunction, saddle anesthesia.  6 Follow-up: Continue scheduled follow-up; patient encouraged to reach out for further documentation or assistance as needed, including letter of medical necessity if required by agency or insurer.       Addendum Summary: This documentation fulfills commitment made via  patient portal communication to support and reinforce referral for home health and personal care services. Medical necessity is established based on objective functional impairment correlated with imaging findings. Alternative resources have been provided given difficulty accessing standard home health services.   Intractable low back pain MRI not safe, will get CT Will asssist with home health Completed patient care assistance paperwork.    ORDER ASSOCIATIONS  #   DIAGNOSIS / CONDITION ICD-10 ENCOUNTER ORDER     ICD-10-CM   1. Intractable low back pain  M54.59 Ambulatory referral to Home Health    Ambulatory referral to Pain Clinic    CT Lumbar Spine Wo Contrast    2. Dependent for bathing  Z74.1     3. Activity, cooking and baking  Y93.G3     4. Difficulty cleaning food preparation area  Z78.9     5. Activities of daily living deficit involving total body bathing  Z78.9     6. Activities of daily living deficit involving grooming and hygiene  Z78.9     7. Deficit in activities of daily living (ADL)  Z78.9     8. Chronic bilateral low back pain with bilateral sciatica  M54.42    M54.41    G89.29     9. Chronic systolic CHF (congestive heart failure) (HCC)  I50.22 atorvastatin  (LIPITOR) 20 MG tablet    10. NICM (nonischemic cardiomyopathy) (HCC)  I42.8     11. Physical deconditioning  R53.81          Orders Placed in Encounter:   Lab Orders  No laboratory test(s) ordered today       Treatment plan discussed and reviewed in detail. Explained medication safety and potential side effects.  Answered all patient questions and confirmed understanding and comfort with the plan. Encouraged patient to contact our office if they have any questions  or concerns.  Agreed on patient coming for a sooner office visit if symptoms worsen, persist, or new symptoms develop. Discussed precautions in case of needing to visit the Emergency Department.     ----------------------------------------------------- Attestation:  Today's Healthcare Provider Bernardino KANDICE Cone, MD was located at office at [facility] [address], [place] [address].  The patient was located at home. All video encounter participant identities and locations confirmed visually and verbally.Today's Telemedicine visit was conducted via synchronous Video after consent for telemedicine was obtained:  Video connection was never lost   This document was transcribed and resynthesized, in part, by artificial intelligence ([employer])  using HIPAA-compliant recording of the clinical interaction;   We have discussed the our use of AI scribe software for clinical note transcription with the patient, who has given verbal consent to proceed.    On the day of the visit, I dedicated 22 minutes to both direct and indirect patient care activities.  The time was spent: History: I obtained, documented, and reviewed a thorough medical history. I reviewed the patient's reported symptoms and clarified their context and significance in relation to the current visit. Data Synthesis: I synthesized information for clinical decision-making. Communication: I communicated clinical status and plan to the patient and/or family/caregiver. Documentation: Documenting clinical findings and medical decision-making, and creating and providing documentation for patient review. Orders: I placed necessary orders (medications, labs, imaging, referrals) in the EMR. Referrals and Communication: I referred the patient to other health care professionals as needed and communicated with them to ensure coordinated care.  This time was spent independently of any separately billable procedures. Please note that this statement is intended to provide a clear and comprehensive account of the time and services provided during the patient's visit.  The extended time spent was necessary to provide safe,  effective, and comprehensive care due to the following factors: and Social Determinants of Health: Care planning took into account living conditions and socioeconomic challenges that significantly influence treatment adherence and outcomes.

## 2024-11-14 ENCOUNTER — Telehealth: Payer: Self-pay

## 2024-11-14 NOTE — Telephone Encounter (Signed)
 Copied from CRM #[id number]. Topic: General - Other >> Nov 14, 2024  9:17 AM Revonda D wrote: Reason for CRM: Alfonso with [facility] stated that a referral for was recently sent for the pt but stated that the 12/12 office notes are missing. Alfonso is requesting that the notes get faxed over today if possible.  Do not know which referral this is from pt has two referral out there I do not have number or anything to know where to fax over notes.

## 2024-11-16 ENCOUNTER — Inpatient Hospital Stay: Admission: RE | Admit: 2024-11-16

## 2024-11-16 DIAGNOSIS — M5459 Other low back pain: Secondary | ICD-10-CM

## 2024-11-16 NOTE — Telephone Encounter (Signed)
 Copied from CRM #[id number]. Topic: General - Other >> Nov 14, 2024  9:17 AM Revonda D wrote: Reason for CRM: Alfonso with [facility] stated that a referral for was recently sent for the pt but stated that the 12/12 office notes are missing. Alfonso is requesting that the notes get faxed over today if possible. >> Nov 16, 2024 10:23 AM Franky GRADE wrote: Alfonso from [employer] is calling to follow up on request for office notes from 11/10/2024. Fax number :[phone]  Faxing over notes now to the above number

## 2024-11-23 ENCOUNTER — Ambulatory Visit: Payer: Self-pay | Admitting: Internal Medicine

## 2024-11-23 NOTE — Assessment & Plan Note (Signed)
 ADDENDUM TO ENCOUNTER (addended 11/23/24)      Purpose of Addendum: To reinforce and document medical necessity for home health and personal care services as committed in prior patient communication, and to provide additional resources given patient's difficulty accessing services.   FUNCTIONAL STATUS ASSESSMENT  Functional Domain Current Status  Ambulation Unable to stand more than a few seconds due to pain; severe claudication limits all mobility  Bathing Dependent -- requires assistance due to inability to safely stand or transfer  Meal Preparation Dependent -- cannot stand long enough to cook; unable to safely use stove  Housekeeping Dependent -- cannot perform cleaning tasks due to pain and mobility limitations  Personal Care Requires assistance with grooming and hygiene tasks   IMAGING CORRELATION      CT Lumbar Spine and Bilateral Hip X-rays demonstrate: Multilevel lumbar spondylosis with degenerative disc disease at L4-L5 and L5-S1 Facet arthropathy contributing to neural foraminal stenosis Degenerative changes of bilateral sacroiliac joints Bilateral hip osteoarthritis Clinical Correlation: Imaging findings are consistent with patient's presentation of chronic low back pain with bilateral lower extremity radiculopathy and claudication, explaining his severe functional impairment.   BARRIER TO CARE      Access Issue: Patient and family report difficulty finding a home health agency able to provide the necessary level of support given the severity of his functional limitations. Prior referral has not resulted in adequate services being established.   PLAN  # Action Item  1 Home Health Referral Reinforced: Updated referral documentation to specify dependence for ADLs (bathing, cooking, cleaning, personal care) due to severe chronic pain, limited mobility, and claudication. Medical necessity clearly established.  2 Pain Management: Continue to pursue pain management evaluation for  comprehensive assessment and potential interventional options.  3 Physical Therapy: In-home physical therapy recommended given patient's inability to travel safely to outpatient facility.  4 Alternative Resources Provided: Patient advised to contact [employer], artist, and Medicaid waiver programs (if eligible) for additional support options if standard home health agencies unable to meet needs.  5 Safety Monitoring: Patient educated on red flag symptoms requiring emergent evaluation: new or progressive weakness, bowel/bladder dysfunction, saddle anesthesia.  6 Follow-up: Continue scheduled follow-up; patient encouraged to reach out for further documentation or assistance as needed, including letter of medical necessity if required by agency or insurer.       Addendum Summary: This documentation fulfills commitment made via patient portal communication to support and reinforce referral for home health and personal care services. Medical necessity is established based on objective functional impairment correlated with imaging findings. Alternative resources have been provided given difficulty accessing standard home health services.

## 2024-11-23 NOTE — Progress Notes (Signed)
 Route to referral team...to appropriate county ADDENDUM TO last encounter as shown to support patient getting home health assistance and physical therapy,I know we are struggling to get an accepting agency.      Purpose of Addendum: To reinforce and document medical necessity for home health and personal care services as committed in prior patient communication, and to provide additional resources given patient's difficulty accessing services.   FUNCTIONAL STATUS ASSESSMENT  Functional Domain Current Status  Ambulation Unable to stand more than a few seconds due to pain; severe claudication limits all mobility  Bathing Dependent -- requires assistance due to inability to safely stand or transfer  Meal Preparation Dependent -- cannot stand long enough to cook; unable to safely use stove  Housekeeping Dependent -- cannot perform cleaning tasks due to pain and mobility limitations  Personal Care Requires assistance with grooming and hygiene tasks   IMAGING CORRELATION      CT Lumbar Spine and Bilateral Hip X-rays demonstrate: Multilevel lumbar spondylosis with degenerative disc disease at L4-L5 and L5-S1 Facet arthropathy contributing to neural foraminal stenosis Degenerative changes of bilateral sacroiliac joints Bilateral hip osteoarthritis Clinical Correlation: Imaging findings are consistent with patient's presentation of chronic low back pain with bilateral lower extremity radiculopathy and claudication, explaining his severe functional impairment.   BARRIER TO CARE      Access Issue: Patient and family report difficulty finding a home health agency able to provide the necessary level of support given the severity of his functional limitations. Prior referral has not resulted in adequate services being established.   PLAN  # Action Item  1 Home Health Referral Reinforced: Updated referral documentation to specify dependence for ADLs (bathing, cooking, cleaning, personal care) due to  severe chronic pain, limited mobility, and claudication. Medical necessity clearly established.  2 Pain Management: Continue to pursue pain management evaluation for comprehensive assessment and potential interventional options.  3 Physical Therapy: In-home physical therapy recommended given patient's inability to travel safely to outpatient facility.  4 Alternative Resources Provided: Patient advised to contact [facility], artist, and Medicaid waiver programs (if eligible) for additional support options if standard home health agencies unable to meet needs.  5 Safety Monitoring: Patient educated on red flag symptoms requiring emergent evaluation: new or progressive weakness, bowel/bladder dysfunction, saddle anesthesia.  6 Follow-up: Continue scheduled follow-up; patient encouraged to reach out for further documentation or assistance as needed, including letter of medical necessity if required by agency or insurer.       Addendum Summary: This documentation fulfills commitment made via patient portal communication to support and reinforce referral for home health and personal care services. Medical necessity is established based on objective functional impairment correlated with imaging findings. Alternative resources have been provided given difficulty accessing standard home health services.

## 2024-11-24 ENCOUNTER — Ambulatory Visit (HOSPITAL_COMMUNITY): Payer: Self-pay

## 2024-11-24 NOTE — Telephone Encounter (Signed)
 Faxed office notes to Alfonso with [facility] [phone]

## 2024-11-26 ENCOUNTER — Encounter: Payer: Self-pay | Admitting: Internal Medicine

## 2024-12-01 ENCOUNTER — Ambulatory Visit: Payer: Self-pay

## 2024-12-08 ENCOUNTER — Telehealth (HOSPITAL_COMMUNITY): Payer: Self-pay

## 2024-12-08 NOTE — Telephone Encounter (Signed)
 Called to confirm/remind patient of their appointment [facility] on 12/08/24.   Appointment:   [] Confirmed  [x] Left mess   [] No answer/No voice mail  [] VM Full/unable to leave message  [] Phone not in service  Patient reminded to bring all medications and/or complete list.  Confirmed patient has transportation. Gave directions, instructed to utilize valet parking.

## 2024-12-11 ENCOUNTER — Ambulatory Visit (INDEPENDENT_AMBULATORY_CARE_PROVIDER_SITE_OTHER): Payer: Self-pay

## 2024-12-11 ENCOUNTER — Ambulatory Visit (HOSPITAL_COMMUNITY): Payer: Self-pay

## 2024-12-11 DIAGNOSIS — I428 Other cardiomyopathies: Secondary | ICD-10-CM | POA: Diagnosis not present

## 2024-12-11 NOTE — Progress Notes (Incomplete)
 Patient ID: Roger Crosby, male   DOB: 30-Sep-1961, 64 y.o.   MRN: [id number]  PCP: Jesus Bernardino MATSU, MD Cardiology: Dr. Rolan  Chief complaint: CHF  64 y.o. with history of LBBB, nonischemic cardiomyopathy, severe OSA, HTN, CKD IIIa, GERD, hx substance abuse.  Echo in 2014 showed EF 20-25% with regional wall motion abnormalities.  LHC 9/14 showed no obstructive disease.  He has a nonischemic cardiomyopathy.  He has a [employer] CRT-D device.   He was lost to followup for about 2 years (was in prison).  He presented to [facility] ER in 2025/03/26 with chest pain.  UDS was positive for cocaine.  He says that he messed up and was grieving for a death in the family.  Echo in 03/26/2025 showed EF 50-55% with mild MR.  Cardiolite  in 03/26/2025 showed EF 40% with no perfusion defect (low risk).    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  Echo in 8/20 showed EF 40-45%.    Echo 11/22: EF 40-45%, moderate LVH, diffuse hypokinesis, PASP 33, mild MR.    Echo was done today and reviewed, EF up to 45-50%, moderate LVH, normal RV size with mildly decreased systolic function, IVC normal.   Today he returns for HF follow up. Weight is down 12 lbs since last appointment.  He says he is not using cocaine, last positive UDS was in 2/25.    ECG (personally reviewed): NSR, BiV paced  St Jude device interrogation (personally reviewed): 96% BiV pacing, no VT/AF, thoracic impedance mildly decreased.   Labs (11/23): K 4.9, creatinine 1.56 Labs (5/25): K 4.4, creatinine 1.39, LDL 96, hgb 15.2, TSH normal  PMH: 1. Hypertension  2. GERD  3. Low back pain  4. LBBB: Newly noted 2011  5. Nonischemic cardiomyopathy: Echo (5/11): EF 45-50%, mild hypokinesis of the apical anteroseptal wall, apical anterior wall, and true apex (LAD distribution), mild LAE.  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  Echo (6/14) with EF 20-25% with  inferior/inferoseptal akinesis and hypokinesis elsewhere.  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  - St Jude CRT-D - Echo 03-26-25) with EF 50-55%, mild MR. - Cardiolite  03/26/2025) with EF 40%, no significant perfusion defect.  - LHC (8/20): anomalous LCx off the RCA, but no significant CAD.  - Echo (8/20): EF 40-45%.  - Echo (11/22): EF 40-45%, moderate LVH, diffuse hypokinesis, PASP 33 mmHg, mild MR.  - Echo (7/25): EF 45-50%, moderate LVH, normal RV size with mildly decreased systolic function, IVC normal.  6. CKD IIIa 7. OSA: Severe on sleep study.  8. Hx cocaine abuse  Family History:  Mother deceased CHF,DM,HTN  Father with heart trouble...died age 87.  1 sister Crohn's disease  1 sister healthy  1 brother died Prostate cancer/AIDS...was diagnosed with prostate cancer at age 22.  Father and mother both had MIs in their 7s   Social History:  Occupation:prior air traffic controller, does some landscaping now.   HS graduate.  Married  3 kids  Alcohol use-no. None since 2008  H/o cocaine use, last positive UDS in 2/25.  Prior smoker none since 2008.   Review of Systems  All systems reviewed and negative except as per HPI.   Current Outpatient Medications  Medication Sig Dispense Refill   aspirin  EC 81 MG tablet Take 1 tablet (81 mg total) by mouth daily. 90 tablet 2  atorvastatin  (LIPITOR) 20 MG tablet Take 1 tablet every day by oral route at bedtime for 90 days. 90 tablet 3   carvedilol  (COREG ) 12.5 MG tablet TAKE 1 & 1/2 TABLETS (18.75 MG TOTAL) BY MOUTH 2 (TWO) TIMES DAILY. 270 tablet 4   carvedilol  (COREG ) 25 MG tablet Take 1 tablet (25 mg total) by mouth 2 (two) times daily. 180 tablet 3   empagliflozin  (JARDIANCE ) 10 MG TABS tablet Take 1 tablet (10 mg total) by mouth daily before breakfast. 90 tablet 3   ENTRESTO  97-103 MG TAKE 1 TABLET BY MOUTH TWICE A DAY 60 tablet 5   furosemide  (LASIX ) 20 MG tablet Take 1 tablet (20 mg total) by mouth  daily. 90 tablet 3   lidocaine  (XYLOCAINE ) 5 % ointment Apply 1 Application topically as needed. 35.44 g 0   omeprazole  (PRILOSEC) 20 MG capsule TAKE 1 CAPSULE BY MOUTH EVERY DAY 90 capsule 1   sacubitril -valsartan  (ENTRESTO ) 97-103 MG Take 1 tablet by mouth 2 (two) times daily. 180 tablet 1   spironolactone  (ALDACTONE ) 25 MG tablet Take 1 tablet (25 mg total) by mouth daily. 90 tablet 3   tamsulosin  (FLOMAX ) 0.4 MG CAPS capsule TAKE 1 CAPSULE (0.4 MG TOTAL) BY MOUTH IN THE MORNING 90 capsule 2   Vitamin D , Ergocalciferol , (DRISDOL ) 1.25 MG (50000 UNIT) CAPS capsule Take 1 capsule (50,000 Units total) by mouth every 7 (seven) days. 12 capsule 1   No current facility-administered medications for this visit.   Wt Readings from Last 3 Encounters:  09/25/24 101.9 kg (224 lb 9.6 oz)  06/22/24 102.4 kg (225 lb 12.8 oz)  05/26/24 99.8 kg (220 lb)   There were no vitals taken for this visit. General: NAD Neck: No JVD, no thyromegaly or thyroid nodule.  Lungs: Clear to auscultation bilaterally with normal respiratory effort. CV: Nondisplaced PMI.  Heart regular S1/S2, no S3/S4, no murmur.  No peripheral edema.  No carotid bruit.  Normal pedal pulses.  Abdomen: Soft, nontender, no hepatosplenomegaly, no distention.  Skin: Intact without lesions or rashes.  Neurologic: Alert and oriented x 3.  Psych: Normal affect. Extremities: No clubbing or cyanosis.  HEENT: Normal.   Assessment/Plan: 1. Nonischemic cardiomyopathy: Possibly related to cocaine, also HTN may play a role.  No ETOH, reports no recent cocaine (last positive UDS in 2/25).  EF 20-25% with LBBB in 2014, had St Jude CRT-D device placed with improvement in LV function.  No significant CAD on cath, most recently in 8/20.  Echo (4/19) showed improvement in EF to 50-55%, and 8/20 echo showed EF back down a bit to 40-45%. Echo 11/22 showed stable EF 40-45%.  Echo today showed EF 45-50%.  NYHA class I symptoms.  Thoracic impedance trending down  on device interrogation but he does not look volume overloaded on exam.  - Restart Jardiance  10 mg daily, this should help take off some fluid. BMET/BNP today.  - Increase Coreg  to 25 mg bid. - Continue Entresto  97/103 mg bid.   - Continue spironolactone  25 mg daily.   2. CKD: Stage 3. BMET today.  - Restart Jardiance .  3. HTN: BP Elevated.  - Increasing Coreg .  4. OSA: Suspected, arrange sleep study.  5. Cocaine abuse: Strongly urged him to avoid. Last positive UDS in 2/25.   Follow    Rebecca Cairns N  12/11/2024

## 2024-12-12 LAB — CUP PACEART REMOTE DEVICE CHECK
Battery Remaining Longevity: 59 mo
Battery Remaining Percentage: 70 %
Battery Voltage: 2.98 V
Brady Statistic AP VP Percent: 1.6 %
Brady Statistic AP VS Percent: 1 %
Brady Statistic AS VP Percent: 95 %
Brady Statistic AS VS Percent: 3.3 %
Brady Statistic RA Percent Paced: 1.5 %
Date Time Interrogation Session: 20260112084813
HighPow Impedance: 75 Ohm
HighPow Impedance: 75 Ohm
Implantable Lead Connection Status: 753985
Implantable Lead Connection Status: 753985
Implantable Lead Connection Status: 753985
Implantable Lead Implant Date: 20160115
Implantable Lead Implant Date: 20160115
Implantable Lead Implant Date: 20160310
Implantable Lead Location: 753858
Implantable Lead Location: 753859
Implantable Lead Location: 753860
Implantable Lead Model: 5076
Implantable Lead Model: 511212
Implantable Lead Serial Number: 247936
Implantable Pulse Generator Implant Date: 20231206
Lead Channel Impedance Value: 390 Ohm
Lead Channel Impedance Value: 540 Ohm
Lead Channel Impedance Value: 640 Ohm
Lead Channel Pacing Threshold Amplitude: 0.5 V
Lead Channel Pacing Threshold Amplitude: 1 V
Lead Channel Pacing Threshold Amplitude: 1 V
Lead Channel Pacing Threshold Pulse Width: 0.5 ms
Lead Channel Pacing Threshold Pulse Width: 0.5 ms
Lead Channel Pacing Threshold Pulse Width: 0.5 ms
Lead Channel Sensing Intrinsic Amplitude: 12 mV
Lead Channel Sensing Intrinsic Amplitude: 5 mV
Lead Channel Setting Pacing Amplitude: 2 V
Lead Channel Setting Pacing Amplitude: 2 V
Lead Channel Setting Pacing Amplitude: 2 V
Lead Channel Setting Pacing Pulse Width: 0.5 ms
Lead Channel Setting Pacing Pulse Width: 0.5 ms
Lead Channel Setting Sensing Sensitivity: 0.5 mV
Pulse Gen Serial Number: 5550090

## 2024-12-13 ENCOUNTER — Ambulatory Visit: Payer: Self-pay | Admitting: Cardiology

## 2024-12-15 ENCOUNTER — Encounter: Payer: Self-pay | Admitting: Internal Medicine

## 2024-12-15 ENCOUNTER — Telehealth: Payer: Self-pay

## 2024-12-15 NOTE — Telephone Encounter (Signed)
 Called pt back about MRI let him know I called over DRI no answer left voicemail stated he understood,

## 2024-12-15 NOTE — Telephone Encounter (Signed)
 Copied from CRM #[id number]. Topic: Clinical - Lab/Test Results >> Dec 14, 2024  3:58 PM Roger Crosby ORN wrote: Reason for CRM: pt requesting mri results.please call to update. [phone]  Spoke with pt states he had mri in dec at [facility] and has not heard back. I told pt that I do not have any results at this time in his chart I will call over to dri today and check on status pt understood well.

## 2024-12-15 NOTE — Progress Notes (Signed)
 Remote ICD Transmission

## 2024-12-15 NOTE — Telephone Encounter (Signed)
 Tried to call DRI no answer left message to call office back about pt MRI that was done dec 18th we have not received results and pt is requesting.

## 2024-12-20 ENCOUNTER — Telehealth: Payer: Self-pay

## 2024-12-20 NOTE — Telephone Encounter (Signed)
 Copied from CRM #[id number]. Topic: General - Other >> Dec 20, 2024  1:21 PM Berneda FALCON wrote: Reason for CRM: Renda from [facility] states they faxed over a physicians order on 12/15/24 for adult pull-ups, wipes, and gloves and wanted to try and get this signed ASAP to make sure the patient has what they need. I did not see the order uploaded to patient chart, and confirmed the fax number. She is going to refax this for the PCP to review and sign if permitted.  [phone] [phone]  Noted we have not received anything will check box in the next little to see if it comes

## 2024-12-22 ENCOUNTER — Ambulatory Visit: Admitting: Internal Medicine

## 2024-12-25 ENCOUNTER — Telehealth: Payer: Self-pay

## 2024-12-25 DIAGNOSIS — I5022 Chronic systolic (congestive) heart failure: Secondary | ICD-10-CM

## 2024-12-25 NOTE — Progress Notes (Signed)
 Complex Care Management Note  Care Guide Note 12/25/2024 Name: Roger Crosby MRN: [id number] DOB: 10-Feb-1961  Roger Crosby is a 64 y.o. year old male who sees Jesus Bernardino MATSU, MD for primary care. I reached out to [facility] by phone today to offer complex care management services.  [name] was given information about Complex Care Management services today including:   The Complex Care Management services include support from the care team which includes your Nurse Care Manager, Clinical Social Worker, or Pharmacist.  The Complex Care Management team is here to help remove barriers to the health concerns and goals most important to you. Complex Care Management services are voluntary, and the patient may decline or stop services at any time by request to their care team member.   Complex Care Management Consent Status: Patient agreed to services and verbal consent obtained.   Follow up plan:  Telephone appointment with complex care management team member scheduled for:  12/26/2024, 12/27/2024 and 01/15/2025.   Encounter Outcome:  Patient Scheduled  Doyce [facility]  [facility], [facility] Guide Direct Dial: [phone]  Fax: [phone]

## 2024-12-25 NOTE — Progress Notes (Signed)
 Complex Care Management Note  Care Guide Note 12/25/2024 Name: [name] MRN: [id number] DOB: 10-Feb-1961  Roger Crosby is a 64 y.o. year old male who sees Jesus Bernardino MATSU, MD for primary care. I reached out to [facility] by phone today to offer complex care management services.  [name] was given information about Complex Care Management services today including:   The Complex Care Management services include support from the care team which includes your Nurse Care Manager, Clinical Social Worker, or Pharmacist.  The Complex Care Management team is here to help remove barriers to the health concerns and goals most important to you. Complex Care Management services are voluntary, and the patient may decline or stop services at any time by request to their care team member.   Complex Care Management Consent Status: Patient agreed to services and verbal consent obtained.   Follow up plan:  Telephone appointment with complex care management team member scheduled for:  12/26/2024, 12/27/2024 and 01/15/2025.   Encounter Outcome:  Patient Scheduled  Doyce [facility]  [facility], [facility] Guide Direct Dial: [phone]  Fax: [phone]

## 2024-12-26 ENCOUNTER — Other Ambulatory Visit: Payer: Self-pay | Admitting: Pharmacist

## 2024-12-26 NOTE — Progress Notes (Signed)
 "  12/26/2024 Name: Roger Crosby MRN: [id number] DOB: 09/18/1961  Chief Complaint  Patient presents with   Medication Adherence   Medication Management    Roger Crosby is a 64 y.o. year old male who presented for a telephone visit.   They were referred to the pharmacist by the [facility] Complex Case Management program for assistance in managing medication access and complex medication management.    Subjective:  Care Team: Primary Care Provider: Jesus Bernardino MATSU, MD ; Next Scheduled Visit: 01/10/2025 Cardiologist: Dr Rolan / [facility]; Next Scheduled Visit: not currently scheduled GI - Dr Abran - appointment 01/18/2025  Medication Access/Adherence  Current Pharmacy:  [id number] [place], [place] - [address] [place] [address] Phone: [phone] Fax: [phone]  [facility] [address], [place] - [address] [place] [address] Phone: [phone] Fax: [phone]   Patient reports affordability concerns with their medications: No  - reports medications are $0 to $4 Patient reports access/transportation concerns to their pharmacy: No  Patient reports adherence concerns with their medications:  No     Referral noted that [facility] nurse recommended patient [facility] DS [facility] [place].    Heart Failure (EF 50-55%):  Current medications:  ACEi/ARB/ARNI: yes - Entresto  SGLT2i: yes - Jardiance   Beta blocker: carvedilol  - confirmed - patient is taking 25mg  twice a day (no longer on 12.5mg  - 1.5 tab twice a day)  Mineralocorticoid Receptor Antagonist: spironolactone  Diuretic regimen: furosemide  20mg  daily as needed.    Patient denies volume overload signs or symptoms including no shortness of breath, lower extremity edema, increased use of pillows at night  No exacerbations of CHF in the last year.    Current medication access support: has Extra Help  thru  Medicare for medication cost.   Objective:  Lab Results  Component Value Date   HGBA1C 6.1 04/19/2024    Lab Results  Component Value Date   CREATININE 1.48 (H) 06/22/2024   BUN 14 06/22/2024   NA 140 06/22/2024   K 4.5 06/22/2024   CL 109 06/22/2024   CO2 24 06/22/2024    Lab Results  Component Value Date   CHOL 156 04/19/2024   HDL 39.90 04/19/2024   LDLCALC 96 04/19/2024   LDLDIRECT 155.3 04/24/2010   TRIG 99.0 04/19/2024   CHOLHDL 4 04/19/2024    Medications Reviewed Today     Reviewed by Carla Milling, RPH-CPP (Pharmacist) on 12/26/24 at 1544  Med List Status: <None>   Medication Order Taking? Sig Documenting Provider Last Dose Status Informant   Patient not taking:   Discontinued 12/26/24 1538 (Completed Course)   aspirin  EC 81 MG tablet [id number] Yes Take 1 tablet (81 mg total) by mouth daily. Jesus Bernardino MATSU, MD  Active   atorvastatin  (LIPITOR) 20 MG tablet [id number] Yes Take 1 tablet every day by oral route at bedtime for 90 days. Jesus Bernardino MATSU, MD  Active     Discontinued 12/26/24 1539 (Dose change)   carvedilol  (COREG ) 25 MG tablet [id number] Yes Take 1 tablet (25 mg total) by mouth 2 (two) times daily. Rolan Ezra RAMAN, MD  Active   empagliflozin  (JARDIANCE ) 10 MG TABS tablet [id number] Yes Take 1 tablet (10 mg total) by mouth daily before breakfast. Rolan Ezra RAMAN, MD  Active     Discontinued 12/26/24 1540 (Duplicate)   furosemide  (LASIX ) 20 MG tablet [id number] Yes Take 1 tablet (20 mg total)  by mouth daily.  Patient taking differently: Take 20 mg by mouth daily as needed for fluid or edema.   Jesus Bernardino MATSU, MD  Active   lidocaine  (XYLOCAINE ) 5 % ointment [id number] Yes Apply 1 Application topically as needed. Jesus Bernardino MATSU, MD  Active   omeprazole  (PRILOSEC) 20 MG capsule [id number] Yes TAKE 1 CAPSULE BY MOUTH EVERY DAY Jesus Bernardino MATSU, MD  Active   sacubitril -valsartan  (ENTRESTO ) 97-103 MG [id number] Yes Take 1 tablet by mouth 2 (two) times  daily. Rolan Ezra RAMAN, MD  Active   spironolactone  (ALDACTONE ) 25 MG tablet [id number] Yes Take 1 tablet (25 mg total) by mouth daily. Jesus Bernardino MATSU, MD  Active   tamsulosin  (FLOMAX ) 0.4 MG CAPS capsule [id number] Yes TAKE 1 CAPSULE (0.4 MG TOTAL) BY MOUTH IN THE MORNING Jesus Bernardino MATSU, MD  Active   Vitamin D , Cholecalciferol, 25 MCG (1000 UT) CAPS [id number] Yes Take 1 capsule by mouth daily. [provider]  Active    Patient not taking:   Discontinued 12/26/24 1544               Assessment/Plan:   Medication Management: - Reviewed and update medications list according patient report and last OV with PCP and cardiology.  - [facility] they were able to fill on meds for 90 days except Entresto  - she states insurance would only allow 30 day supply. Patient notified. The following meds were requested - atorvastatin , carvedilol , Jardiance , Entresto  and spironolactone .  Patient endorsed that he has plenty of omeprazole , tamsulosin  and furosemide  currently.  - Reminded patient to reschedule follow up with CHF clinic.    Follow Up Plan: 1 month to review meds and follow up adherence.   Madelin Ray, PharmD Clinical Pharmacist [facility]  Population Health [phone]    "

## 2024-12-27 ENCOUNTER — Telehealth: Payer: Self-pay

## 2024-12-28 ENCOUNTER — Ambulatory Visit

## 2024-12-28 VITALS — Ht 68.0 in | Wt 220.0 lb

## 2024-12-28 DIAGNOSIS — Z1211 Encounter for screening for malignant neoplasm of colon: Secondary | ICD-10-CM

## 2024-12-28 MED ORDER — PEG 3350-KCL-NA BICARB-NACL 420 G PO SOLR: 4000.0000 mL | Freq: Once | ORAL | 0 refills | Status: AC

## 2024-12-28 NOTE — Progress Notes (Signed)

## 2025-01-10 ENCOUNTER — Ambulatory Visit: Admitting: Internal Medicine

## 2025-01-15 ENCOUNTER — Telehealth: Admitting: Licensed Clinical Social Worker

## 2025-01-18 ENCOUNTER — Encounter: Admitting: Internal Medicine

## 2025-03-02 ENCOUNTER — Ambulatory Visit: Payer: Self-pay

## 2025-03-12 ENCOUNTER — Ambulatory Visit: Payer: Self-pay

## 2025-03-26 ENCOUNTER — Ambulatory Visit: Payer: MEDICAID | Admitting: Internal Medicine

## 2025-04-20 ENCOUNTER — Encounter: Payer: MEDICAID | Admitting: Internal Medicine

## 2025-06-04 ENCOUNTER — Ambulatory Visit: Payer: Self-pay

## 2025-06-11 ENCOUNTER — Ambulatory Visit: Payer: Self-pay

## 2025-08-31 ENCOUNTER — Ambulatory Visit: Payer: Self-pay
